# Patient Record
Sex: Female | Born: 1965 | Race: White | Hispanic: No | Smoking: Current every day smoker
Health system: Southern US, Community
[De-identification: ages and names within clinical notes are randomized; demographics above are authoritative.]

## PROBLEM LIST (undated history)

## (undated) DIAGNOSIS — I2510 Atherosclerotic heart disease of native coronary artery without angina pectoris: Secondary | ICD-10-CM

## (undated) DIAGNOSIS — G2581 Restless legs syndrome: Secondary | ICD-10-CM

## (undated) DIAGNOSIS — J449 Chronic obstructive pulmonary disease, unspecified: Secondary | ICD-10-CM

## (undated) DIAGNOSIS — T7840XA Allergy, unspecified, initial encounter: Secondary | ICD-10-CM

## (undated) DIAGNOSIS — J439 Emphysema, unspecified: Secondary | ICD-10-CM

## (undated) DIAGNOSIS — K219 Gastro-esophageal reflux disease without esophagitis: Secondary | ICD-10-CM

## (undated) DIAGNOSIS — G43909 Migraine, unspecified, not intractable, without status migrainosus: Secondary | ICD-10-CM

## (undated) DIAGNOSIS — F32A Depression, unspecified: Secondary | ICD-10-CM

## (undated) DIAGNOSIS — F1910 Other psychoactive substance abuse, uncomplicated: Secondary | ICD-10-CM

## (undated) DIAGNOSIS — M1990 Unspecified osteoarthritis, unspecified site: Secondary | ICD-10-CM

## (undated) DIAGNOSIS — M79606 Pain in leg, unspecified: Secondary | ICD-10-CM

## (undated) DIAGNOSIS — M48061 Spinal stenosis, lumbar region without neurogenic claudication: Secondary | ICD-10-CM

## (undated) DIAGNOSIS — F319 Bipolar disorder, unspecified: Secondary | ICD-10-CM

## (undated) DIAGNOSIS — J45909 Unspecified asthma, uncomplicated: Secondary | ICD-10-CM

## (undated) HISTORY — DX: Chronic obstructive pulmonary disease, unspecified: J44.9

## (undated) HISTORY — PX: OTHER SURGICAL HISTORY: SHX169

## (undated) HISTORY — DX: Pain in leg, unspecified: M79.606

## (undated) HISTORY — DX: Allergy, unspecified, initial encounter: T78.40XA

## (undated) HISTORY — PX: COLON SURGERY: SHX602

## (undated) HISTORY — PX: ABDOMINAL HYSTERECTOMY: SHX81

## (undated) HISTORY — DX: Atherosclerotic heart disease of native coronary artery without angina pectoris: I25.10

## (undated) HISTORY — DX: Gastro-esophageal reflux disease without esophagitis: K21.9

## (undated) HISTORY — DX: Restless legs syndrome: G25.81

## (undated) HISTORY — DX: Unspecified asthma, uncomplicated: J45.909

## (undated) HISTORY — DX: Other psychoactive substance abuse, uncomplicated: F19.10

## (undated) HISTORY — PX: BREAST SURGERY: SHX581

## (undated) HISTORY — DX: Emphysema, unspecified: J43.9

## (undated) HISTORY — PX: SMALL INTESTINE SURGERY: SHX150

## (undated) HISTORY — PX: CARPAL TUNNEL RELEASE: SHX101

## (undated) HISTORY — PX: TUBAL LIGATION: SHX77

## (undated) HISTORY — PX: APPENDECTOMY: SHX54

## (undated) HISTORY — PX: EYE SURGERY: SHX253

## (undated) HISTORY — DX: Migraine, unspecified, not intractable, without status migrainosus: G43.909

## (undated) HISTORY — DX: Bipolar disorder, unspecified: F31.9

## (undated) HISTORY — DX: Depression, unspecified: F32.A

## (undated) HISTORY — PX: TOTAL ABDOMINAL HYSTERECTOMY W/ BILATERAL SALPINGOOPHORECTOMY: SHX83

## (undated) HISTORY — DX: Unspecified osteoarthritis, unspecified site: M19.90

## (undated) HISTORY — DX: Spinal stenosis, lumbar region without neurogenic claudication: M48.061

---
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
## 2004-05-29 ENCOUNTER — Other Ambulatory Visit: Payer: Self-pay

## 2004-07-15 ENCOUNTER — Inpatient Hospital Stay: Payer: Self-pay | Admitting: Internal Medicine

## 2004-07-15 ENCOUNTER — Other Ambulatory Visit: Payer: Self-pay

## 2004-08-17 ENCOUNTER — Emergency Department: Payer: Self-pay | Admitting: Emergency Medicine

## 2004-12-13 ENCOUNTER — Inpatient Hospital Stay: Payer: Self-pay

## 2005-01-20 ENCOUNTER — Other Ambulatory Visit: Payer: Self-pay

## 2005-01-20 ENCOUNTER — Emergency Department: Payer: Self-pay | Admitting: Emergency Medicine

## 2006-01-22 ENCOUNTER — Emergency Department: Payer: Self-pay | Admitting: Emergency Medicine

## 2006-01-26 ENCOUNTER — Ambulatory Visit: Payer: Self-pay

## 2006-06-03 ENCOUNTER — Emergency Department: Payer: Self-pay | Admitting: Emergency Medicine

## 2006-07-04 ENCOUNTER — Emergency Department: Payer: Self-pay | Admitting: Emergency Medicine

## 2006-08-23 ENCOUNTER — Emergency Department: Payer: Self-pay | Admitting: Internal Medicine

## 2006-09-15 HISTORY — PX: TOTAL ABDOMINAL HYSTERECTOMY W/ BILATERAL SALPINGOOPHORECTOMY: SHX83

## 2006-09-21 ENCOUNTER — Ambulatory Visit: Payer: Self-pay | Admitting: Gastroenterology

## 2006-12-22 ENCOUNTER — Ambulatory Visit: Payer: Self-pay | Admitting: Unknown Physician Specialty

## 2006-12-24 ENCOUNTER — Ambulatory Visit: Payer: Self-pay | Admitting: Unknown Physician Specialty

## 2007-02-15 ENCOUNTER — Ambulatory Visit: Payer: Self-pay | Admitting: Unknown Physician Specialty

## 2007-02-16 ENCOUNTER — Inpatient Hospital Stay: Payer: Self-pay | Admitting: Unknown Physician Specialty

## 2007-02-27 ENCOUNTER — Emergency Department: Payer: Self-pay | Admitting: Emergency Medicine

## 2007-03-29 ENCOUNTER — Emergency Department: Payer: Self-pay | Admitting: Emergency Medicine

## 2007-05-11 ENCOUNTER — Ambulatory Visit: Payer: Self-pay | Admitting: Internal Medicine

## 2007-06-02 ENCOUNTER — Emergency Department: Payer: Self-pay | Admitting: Emergency Medicine

## 2007-10-11 ENCOUNTER — Emergency Department: Payer: Self-pay | Admitting: Emergency Medicine

## 2007-11-18 ENCOUNTER — Ambulatory Visit: Payer: Self-pay | Admitting: Internal Medicine

## 2008-03-21 ENCOUNTER — Ambulatory Visit: Payer: Self-pay | Admitting: Internal Medicine

## 2008-05-25 ENCOUNTER — Ambulatory Visit: Payer: Self-pay

## 2008-05-30 ENCOUNTER — Ambulatory Visit: Payer: Self-pay

## 2008-06-05 ENCOUNTER — Ambulatory Visit: Payer: Self-pay | Admitting: Internal Medicine

## 2008-07-12 ENCOUNTER — Ambulatory Visit: Payer: Self-pay | Admitting: Gastroenterology

## 2008-07-27 ENCOUNTER — Ambulatory Visit: Payer: Self-pay | Admitting: Gastroenterology

## 2008-12-04 ENCOUNTER — Ambulatory Visit: Payer: Self-pay

## 2008-12-23 ENCOUNTER — Emergency Department: Payer: Self-pay | Admitting: Emergency Medicine

## 2008-12-27 ENCOUNTER — Ambulatory Visit: Payer: Self-pay | Admitting: Pain Medicine

## 2008-12-27 ENCOUNTER — Emergency Department: Payer: Self-pay | Admitting: Emergency Medicine

## 2008-12-30 ENCOUNTER — Emergency Department: Payer: Self-pay | Admitting: Emergency Medicine

## 2009-01-07 ENCOUNTER — Emergency Department: Payer: Self-pay | Admitting: Emergency Medicine

## 2009-01-15 ENCOUNTER — Emergency Department: Payer: Self-pay | Admitting: Emergency Medicine

## 2009-01-18 ENCOUNTER — Emergency Department: Payer: Self-pay | Admitting: Emergency Medicine

## 2009-01-24 ENCOUNTER — Emergency Department: Payer: Self-pay | Admitting: Emergency Medicine

## 2009-01-24 ENCOUNTER — Ambulatory Visit: Payer: Self-pay | Admitting: Pain Medicine

## 2009-03-12 ENCOUNTER — Emergency Department: Payer: Self-pay | Admitting: Emergency Medicine

## 2009-03-14 ENCOUNTER — Ambulatory Visit: Payer: Self-pay | Admitting: Unknown Physician Specialty

## 2009-03-16 ENCOUNTER — Ambulatory Visit: Payer: Self-pay | Admitting: Neurology

## 2009-03-22 ENCOUNTER — Ambulatory Visit: Payer: Self-pay | Admitting: Neurology

## 2009-03-30 ENCOUNTER — Ambulatory Visit: Payer: Self-pay | Admitting: Neurology

## 2009-04-20 ENCOUNTER — Observation Stay: Payer: Self-pay | Admitting: Internal Medicine

## 2009-04-30 ENCOUNTER — Emergency Department: Payer: Self-pay | Admitting: Emergency Medicine

## 2009-08-21 ENCOUNTER — Emergency Department: Payer: Self-pay | Admitting: Emergency Medicine

## 2010-03-14 ENCOUNTER — Emergency Department: Payer: Self-pay | Admitting: Emergency Medicine

## 2010-11-22 ENCOUNTER — Inpatient Hospital Stay: Payer: Self-pay | Admitting: Surgery

## 2010-11-26 LAB — PATHOLOGY REPORT

## 2011-06-25 DIAGNOSIS — J4489 Other specified chronic obstructive pulmonary disease: Secondary | ICD-10-CM | POA: Insufficient documentation

## 2011-06-25 DIAGNOSIS — F17210 Nicotine dependence, cigarettes, uncomplicated: Secondary | ICD-10-CM | POA: Insufficient documentation

## 2011-06-25 DIAGNOSIS — J449 Chronic obstructive pulmonary disease, unspecified: Secondary | ICD-10-CM

## 2011-06-25 DIAGNOSIS — F17219 Nicotine dependence, cigarettes, with unspecified nicotine-induced disorders: Secondary | ICD-10-CM | POA: Insufficient documentation

## 2011-06-25 HISTORY — DX: Chronic obstructive pulmonary disease, unspecified: J44.9

## 2011-06-25 HISTORY — DX: Other specified chronic obstructive pulmonary disease: J44.89

## 2011-06-25 HISTORY — DX: Nicotine dependence, cigarettes, with unspecified nicotine-induced disorders: F17.219

## 2012-02-02 ENCOUNTER — Ambulatory Visit: Payer: Self-pay | Admitting: Specialist

## 2012-02-02 DIAGNOSIS — I10 Essential (primary) hypertension: Secondary | ICD-10-CM

## 2012-02-02 LAB — POTASSIUM: Potassium: 3.8 mmol/L (ref 3.5–5.1)

## 2012-02-06 ENCOUNTER — Ambulatory Visit: Payer: Self-pay | Admitting: Specialist

## 2012-09-07 LAB — CBC
HCT: 38.5 % (ref 35.0–47.0)
Platelet: 277 10*3/uL (ref 150–440)
RDW: 13.8 % (ref 11.5–14.5)
WBC: 13.7 10*3/uL — ABNORMAL HIGH (ref 3.6–11.0)

## 2012-09-07 LAB — URINALYSIS, COMPLETE
Bacteria: NONE SEEN
Bilirubin,UR: NEGATIVE
Glucose,UR: NEGATIVE mg/dL (ref 0–75)
Protein: 100
Specific Gravity: 1.031 (ref 1.003–1.030)
Squamous Epithelial: 5
WBC UR: 23 /HPF (ref 0–5)

## 2012-09-07 LAB — COMPREHENSIVE METABOLIC PANEL
Alkaline Phosphatase: 101 U/L (ref 50–136)
Anion Gap: 9 (ref 7–16)
BUN: 20 mg/dL — ABNORMAL HIGH (ref 7–18)
Bilirubin,Total: 0.7 mg/dL (ref 0.2–1.0)
Creatinine: 0.91 mg/dL (ref 0.60–1.30)
EGFR (Non-African Amer.): >60
Glucose: 120 mg/dL — ABNORMAL HIGH (ref 65–99)
Osmolality: 276 (ref 275–301)
SGPT (ALT): 14 U/L (ref 12–78)
Sodium: 136 mmol/L (ref 136–145)

## 2012-09-07 LAB — LIPASE, BLOOD: Lipase: 79 U/L (ref 73–393)

## 2012-09-08 ENCOUNTER — Inpatient Hospital Stay: Payer: Self-pay | Admitting: Surgery

## 2012-09-08 LAB — CBC WITH DIFFERENTIAL/PLATELET
HCT: 42.8 % (ref 35.0–47.0)
Lymphocyte #: 0.4 10*3/uL — ABNORMAL LOW (ref 1.0–3.6)
MCH: 32.1 pg (ref 26.0–34.0)
MCHC: 34 g/dL (ref 32.0–36.0)
MCV: 94 fL (ref 80–100)
Monocyte #: 0.4 x10 3/mm (ref 0.2–0.9)
Monocyte %: 3.7 %
Neutrophil #: 10.1 10*3/uL — ABNORMAL HIGH (ref 1.4–6.5)
RDW: 14.2 % (ref 11.5–14.5)
WBC: 10.9 10*3/uL (ref 3.6–11.0)

## 2012-09-08 LAB — BASIC METABOLIC PANEL
Calcium, Total: 7.4 mg/dL — ABNORMAL LOW (ref 8.5–10.1)
Co2: 21 mmol/L (ref 21–32)
EGFR (Non-African Amer.): >60
Potassium: 3.6 mmol/L (ref 3.5–5.1)
Sodium: 138 mmol/L (ref 136–145)

## 2012-09-09 LAB — BASIC METABOLIC PANEL
Calcium, Total: 7.2 mg/dL — ABNORMAL LOW (ref 8.5–10.1)
Chloride: 107 mmol/L (ref 98–107)
Osmolality: 275 (ref 275–301)
Potassium: 3.9 mmol/L (ref 3.5–5.1)
Sodium: 137 mmol/L (ref 136–145)

## 2012-09-09 LAB — CBC WITH DIFFERENTIAL/PLATELET
Basophil #: 0 10*3/uL (ref 0.0–0.1)
Eosinophil #: 0 10*3/uL (ref 0.0–0.7)
HCT: 32 % — ABNORMAL LOW (ref 35.0–47.0)
Lymphocyte #: 0.7 10*3/uL — ABNORMAL LOW (ref 1.0–3.6)
Lymphocyte %: 4.8 %
MCHC: 33.2 g/dL (ref 32.0–36.0)
Neutrophil #: 12.5 10*3/uL — ABNORMAL HIGH (ref 1.4–6.5)
RDW: 14.3 % (ref 11.5–14.5)

## 2012-09-10 DIAGNOSIS — I472 Ventricular tachycardia, unspecified: Secondary | ICD-10-CM

## 2012-09-10 DIAGNOSIS — I4729 Other ventricular tachycardia: Secondary | ICD-10-CM

## 2012-09-10 LAB — CBC WITH DIFFERENTIAL/PLATELET
Basophil #: 0 10*3/uL (ref 0.0–0.1)
Eosinophil #: 0 10*3/uL (ref 0.0–0.7)
Lymphocyte #: 0.9 10*3/uL — ABNORMAL LOW (ref 1.0–3.6)
Lymphocyte %: 8.4 %
MCH: 32.2 pg (ref 26.0–34.0)
Monocyte %: 6.2 %
Neutrophil #: 9 10*3/uL — ABNORMAL HIGH (ref 1.4–6.5)
Neutrophil %: 84.8 %
Platelet: 264 10*3/uL (ref 150–440)
RBC: 2.94 10*6/uL — ABNORMAL LOW (ref 3.80–5.20)
WBC: 10.7 10*3/uL (ref 3.6–11.0)

## 2012-09-10 LAB — BASIC METABOLIC PANEL
Anion Gap: 8 (ref 7–16)
Anion Gap: 9 (ref 7–16)
Calcium, Total: 7.2 mg/dL — ABNORMAL LOW (ref 8.5–10.1)
Calcium, Total: 7.3 mg/dL — ABNORMAL LOW (ref 8.5–10.1)
Chloride: 110 mmol/L — ABNORMAL HIGH (ref 98–107)
Co2: 21 mmol/L (ref 21–32)
Co2: 20 mmol/L — ABNORMAL LOW (ref 21–32)
Creatinine: 0.53 mg/dL — ABNORMAL LOW (ref 0.60–1.30)
EGFR (African American): >60
EGFR (African American): >60
Glucose: 97 mg/dL (ref 65–99)
Osmolality: 275 (ref 275–301)
Potassium: 3.6 mmol/L (ref 3.5–5.1)
Sodium: 139 mmol/L (ref 136–145)

## 2012-09-10 LAB — MAGNESIUM: Magnesium: 2.2 mg/dL

## 2012-09-10 LAB — PATHOLOGY REPORT

## 2012-09-11 LAB — CBC WITH DIFFERENTIAL/PLATELET
Basophil %: 0.4 %
Eosinophil %: 0.6 %
HCT: 28.2 % — ABNORMAL LOW (ref 35.0–47.0)
HGB: 10.2 g/dL — ABNORMAL LOW (ref 12.0–16.0)
Lymphocyte %: 12.6 %
Monocyte #: 1.1 x10 3/mm — ABNORMAL HIGH (ref 0.2–0.9)
Monocyte %: 9.1 %
WBC: 12.2 10*3/uL — ABNORMAL HIGH (ref 3.6–11.0)

## 2012-09-11 LAB — BASIC METABOLIC PANEL
Anion Gap: 7 (ref 7–16)
BUN: 4 mg/dL — ABNORMAL LOW (ref 7–18)
Chloride: 115 mmol/L — ABNORMAL HIGH (ref 98–107)
Co2: 20 mmol/L — ABNORMAL LOW (ref 21–32)
Creatinine: 0.53 mg/dL — ABNORMAL LOW (ref 0.60–1.30)
Potassium: 3.6 mmol/L (ref 3.5–5.1)

## 2012-09-12 LAB — PHOSPHORUS: Phosphorus: 2 mg/dL — ABNORMAL LOW (ref 2.5–4.9)

## 2012-09-12 LAB — BASIC METABOLIC PANEL
Anion Gap: 8 (ref 7–16)
BUN: 6 mg/dL — ABNORMAL LOW (ref 7–18)
Creatinine: 0.64 mg/dL (ref 0.60–1.30)
EGFR (African American): >60
Glucose: 140 mg/dL — ABNORMAL HIGH (ref 65–99)

## 2012-09-14 LAB — CBC WITH DIFFERENTIAL/PLATELET
Basophil #: 0.3 10*3/uL — ABNORMAL HIGH (ref 0.0–0.1)
Basophil %: 1.4 %
HCT: 26.3 % — ABNORMAL LOW (ref 35.0–47.0)
HGB: 8.9 g/dL — ABNORMAL LOW (ref 12.0–16.0)
Lymphocyte %: 9.8 %
Monocyte %: 6.5 %
Neutrophil #: 15.6 10*3/uL — ABNORMAL HIGH (ref 1.4–6.5)
RDW: 14.6 % — ABNORMAL HIGH (ref 11.5–14.5)
WBC: 19.3 10*3/uL — ABNORMAL HIGH (ref 3.6–11.0)

## 2012-09-14 LAB — BASIC METABOLIC PANEL
BUN: 8 mg/dL (ref 7–18)
Chloride: 115 mmol/L — ABNORMAL HIGH (ref 98–107)
Osmolality: 285 (ref 275–301)
Potassium: 3.6 mmol/L (ref 3.5–5.1)

## 2012-09-14 LAB — TSH: Thyroid Stimulating Horm: 6.84 u[IU]/mL — ABNORMAL HIGH

## 2012-09-15 HISTORY — PX: COLOSTOMY REVISION: SHX5232

## 2012-09-15 LAB — BASIC METABOLIC PANEL WITH GFR
Anion Gap: 9 (ref 7–16)
BUN: 9 mg/dL (ref 7–18)
Calcium, Total: 7.4 mg/dL — ABNORMAL LOW (ref 8.5–10.1)
Chloride: 113 mmol/L — ABNORMAL HIGH (ref 98–107)
Co2: 20 mmol/L — ABNORMAL LOW (ref 21–32)
Creatinine: 0.64 mg/dL (ref 0.60–1.30)
EGFR (African American): >60
EGFR (Non-African Amer.): >60
Glucose: 98 mg/dL (ref 65–99)
Osmolality: 282 (ref 275–301)
Potassium: 3.5 mmol/L (ref 3.5–5.1)
Sodium: 142 mmol/L (ref 136–145)

## 2012-09-15 LAB — CBC WITH DIFFERENTIAL/PLATELET
Eosinophil #: 0.2 10*3/uL (ref 0.0–0.7)
HCT: 22.3 % — ABNORMAL LOW (ref 35.0–47.0)
Lymphocyte %: 15.6 %
MCH: 31.4 pg (ref 26.0–34.0)
MCHC: 33.7 g/dL (ref 32.0–36.0)
MCV: 93 fL (ref 80–100)
Monocyte #: 1 x10 3/mm — ABNORMAL HIGH (ref 0.2–0.9)
Monocyte %: 7.7 %
Neutrophil %: 73.7 %
WBC: 13.5 10*3/uL — ABNORMAL HIGH (ref 3.6–11.0)

## 2012-09-16 LAB — CBC WITH DIFFERENTIAL/PLATELET
Basophil #: 0.1 10*3/uL (ref 0.0–0.1)
Basophil %: 1.2 %
Eosinophil #: 0.2 10*3/uL (ref 0.0–0.7)
Eosinophil %: 1.8 %
HCT: 29.6 % — ABNORMAL LOW (ref 35.0–47.0)
Lymphocyte #: 2.1 10*3/uL (ref 1.0–3.6)
MCH: 30.3 pg (ref 26.0–34.0)
MCV: 91 fL (ref 80–100)
Monocyte #: 1.2 x10 3/mm — ABNORMAL HIGH (ref 0.2–0.9)
Neutrophil %: 71.5 %
RBC: 3.26 10*6/uL — ABNORMAL LOW (ref 3.80–5.20)
WBC: 12.7 10*3/uL — ABNORMAL HIGH (ref 3.6–11.0)

## 2012-09-16 LAB — BASIC METABOLIC PANEL
BUN: 11 mg/dL (ref 7–18)
Calcium, Total: 7.6 mg/dL — ABNORMAL LOW (ref 8.5–10.1)
Co2: 18 mmol/L — ABNORMAL LOW (ref 21–32)
Creatinine: 0.67 mg/dL (ref 0.60–1.30)
Glucose: 117 mg/dL — ABNORMAL HIGH (ref 65–99)
Osmolality: 287 (ref 275–301)
Sodium: 144 mmol/L (ref 136–145)

## 2012-09-20 LAB — CBC WITH DIFFERENTIAL/PLATELET
Basophil #: 0.2 10*3/uL — ABNORMAL HIGH (ref 0.0–0.1)
Basophil %: 1.5 %
Eosinophil #: 0.1 10*3/uL (ref 0.0–0.7)
HCT: 30.2 % — ABNORMAL LOW (ref 35.0–47.0)
HGB: 9.4 g/dL — ABNORMAL LOW (ref 12.0–16.0)
Lymphocyte #: 2.3 10*3/uL (ref 1.0–3.6)
MCHC: 31.3 g/dL — ABNORMAL LOW (ref 32.0–36.0)
MCV: 92 fL (ref 80–100)
Monocyte %: 8.5 %
Neutrophil #: 6.6 10*3/uL — ABNORMAL HIGH (ref 1.4–6.5)
WBC: 10 10*3/uL (ref 3.6–11.0)

## 2012-09-20 LAB — BASIC METABOLIC PANEL
Anion Gap: 10 (ref 7–16)
BUN: 13 mg/dL (ref 7–18)
Chloride: 109 mmol/L — ABNORMAL HIGH (ref 98–107)
Co2: 22 mmol/L (ref 21–32)
Creatinine: 0.62 mg/dL (ref 0.60–1.30)
Glucose: 122 mg/dL — ABNORMAL HIGH (ref 65–99)
Osmolality: 283 (ref 275–301)
Potassium: 3.7 mmol/L (ref 3.5–5.1)
Sodium: 141 mmol/L (ref 136–145)

## 2012-09-22 ENCOUNTER — Emergency Department: Payer: Self-pay | Admitting: Emergency Medicine

## 2012-09-22 LAB — COMPREHENSIVE METABOLIC PANEL
Alkaline Phosphatase: 150 U/L — ABNORMAL HIGH (ref 50–136)
Anion Gap: 10 (ref 7–16)
BUN: 14 mg/dL (ref 7–18)
Chloride: 109 mmol/L — ABNORMAL HIGH (ref 98–107)
Co2: 20 mmol/L — ABNORMAL LOW (ref 21–32)
Creatinine: 0.69 mg/dL (ref 0.60–1.30)
Glucose: 116 mg/dL — ABNORMAL HIGH (ref 65–99)
Osmolality: 279 (ref 275–301)
Sodium: 139 mmol/L (ref 136–145)
Total Protein: 9.4 g/dL — ABNORMAL HIGH (ref 6.4–8.2)

## 2012-09-22 LAB — URINALYSIS, COMPLETE
Bilirubin,UR: NEGATIVE
Blood: NEGATIVE
Glucose,UR: NEGATIVE mg/dL (ref 0–75)
Leukocyte Esterase: NEGATIVE
Nitrite: NEGATIVE
Protein: 30
RBC,UR: 4 /HPF (ref 0–5)
Specific Gravity: 1.029 (ref 1.003–1.030)
Squamous Epithelial: 1

## 2012-09-22 LAB — CBC
HCT: 34.1 % — ABNORMAL LOW (ref 35.0–47.0)
HGB: 11.6 g/dL — ABNORMAL LOW (ref 12.0–16.0)
MCHC: 33.9 g/dL (ref 32.0–36.0)
MCV: 91 fL (ref 80–100)
Platelet: 753 10*3/uL — ABNORMAL HIGH (ref 150–440)
RBC: 3.73 10*6/uL — ABNORMAL LOW (ref 3.80–5.20)

## 2012-09-23 ENCOUNTER — Inpatient Hospital Stay: Payer: Self-pay | Admitting: Surgery

## 2012-09-23 LAB — URINALYSIS, COMPLETE
Bilirubin,UR: NEGATIVE
Glucose,UR: NEGATIVE mg/dL (ref 0–75)
Leukocyte Esterase: NEGATIVE
Nitrite: NEGATIVE
Ph: 5 (ref 4.5–8.0)
Protein: NEGATIVE
RBC,UR: <1 /HPF (ref 0–5)
Specific Gravity: 1.011 (ref 1.003–1.030)
Squamous Epithelial: 1
Transitional Epi: <1
WBC UR: 3 /HPF (ref 0–5)

## 2012-09-23 LAB — CBC
HCT: 34.3 % — ABNORMAL LOW (ref 35.0–47.0)
HGB: 11.5 g/dL — ABNORMAL LOW (ref 12.0–16.0)
Platelet: 647 10*3/uL — ABNORMAL HIGH (ref 150–440)
RBC: 3.73 10*6/uL — ABNORMAL LOW (ref 3.80–5.20)

## 2012-09-23 LAB — COMPREHENSIVE METABOLIC PANEL
Anion Gap: 11 (ref 7–16)
BUN: 11 mg/dL (ref 7–18)
Bilirubin,Total: 0.3 mg/dL (ref 0.2–1.0)
Calcium, Total: 8.5 mg/dL (ref 8.5–10.1)
EGFR (African American): >60
EGFR (Non-African Amer.): >60
Osmolality: 268 (ref 275–301)
Potassium: 3.2 mmol/L — ABNORMAL LOW (ref 3.5–5.1)
SGOT(AST): 16 U/L (ref 15–37)
Sodium: 134 mmol/L — ABNORMAL LOW (ref 136–145)
Total Protein: 9.2 g/dL — ABNORMAL HIGH (ref 6.4–8.2)

## 2012-09-23 LAB — LIPASE, BLOOD: Lipase: 87 U/L (ref 73–393)

## 2012-09-26 LAB — COMPREHENSIVE METABOLIC PANEL
Albumin: 2.6 g/dL — ABNORMAL LOW (ref 3.4–5.0)
Alkaline Phosphatase: 168 U/L — ABNORMAL HIGH (ref 50–136)
Anion Gap: 8 (ref 7–16)
Bilirubin,Total: 0.3 mg/dL (ref 0.2–1.0)
Chloride: 104 mmol/L (ref 98–107)
Creatinine: 0.97 mg/dL (ref 0.60–1.30)
EGFR (African American): >60
EGFR (Non-African Amer.): >60
Glucose: 102 mg/dL — ABNORMAL HIGH (ref 65–99)
Osmolality: 275 (ref 275–301)
Potassium: 2.8 mmol/L — ABNORMAL LOW (ref 3.5–5.1)
SGPT (ALT): 13 U/L (ref 12–78)
Sodium: 138 mmol/L (ref 136–145)
Total Protein: 9.1 g/dL — ABNORMAL HIGH (ref 6.4–8.2)

## 2012-09-26 LAB — CBC WITH DIFFERENTIAL/PLATELET
Basophil #: 0 10*3/uL (ref 0.0–0.1)
Basophil %: 0.3 %
Eosinophil #: 0.1 10*3/uL (ref 0.0–0.7)
Eosinophil %: 0.8 %
HCT: 35.9 % (ref 35.0–47.0)
Lymphocyte #: 1.3 10*3/uL (ref 1.0–3.6)
MCH: 29.9 pg (ref 26.0–34.0)
MCV: 92 fL (ref 80–100)
Monocyte #: 1 x10 3/mm — ABNORMAL HIGH (ref 0.2–0.9)
Neutrophil #: 11.5 10*3/uL — ABNORMAL HIGH (ref 1.4–6.5)
Neutrophil %: 82 %
RDW: 15 % — ABNORMAL HIGH (ref 11.5–14.5)
WBC: 14 10*3/uL — ABNORMAL HIGH (ref 3.6–11.0)

## 2012-09-26 LAB — URINALYSIS, COMPLETE
Blood: NEGATIVE
Ketone: NEGATIVE
Leukocyte Esterase: NEGATIVE
Nitrite: NEGATIVE
Ph: 5 (ref 4.5–8.0)
Protein: NEGATIVE
Specific Gravity: 1.011 (ref 1.003–1.030)

## 2012-09-27 ENCOUNTER — Inpatient Hospital Stay: Payer: Self-pay | Admitting: Surgery

## 2012-09-27 LAB — CBC WITH DIFFERENTIAL/PLATELET
Basophil %: 0.5 %
Eosinophil #: 0.1 10*3/uL (ref 0.0–0.7)
HCT: 30 % — ABNORMAL LOW (ref 35.0–47.0)
Lymphocyte #: 1.7 10*3/uL (ref 1.0–3.6)
Lymphocyte %: 23.8 %
MCH: 29.7 pg (ref 26.0–34.0)
MCHC: 32.6 g/dL (ref 32.0–36.0)
Monocyte #: 0.9 x10 3/mm (ref 0.2–0.9)
Monocyte %: 13.2 %
Neutrophil #: 4.4 10*3/uL (ref 1.4–6.5)
Neutrophil %: 61.3 %
RBC: 3.29 10*6/uL — ABNORMAL LOW (ref 3.80–5.20)
RDW: 14.9 % — ABNORMAL HIGH (ref 11.5–14.5)
WBC: 7.2 10*3/uL (ref 3.6–11.0)

## 2012-09-27 LAB — MAGNESIUM: Magnesium: 1.9 mg/dL

## 2012-09-27 LAB — BASIC METABOLIC PANEL
Anion Gap: 14 (ref 7–16)
BUN: 9 mg/dL (ref 7–18)
Calcium, Total: 8.2 mg/dL — ABNORMAL LOW (ref 8.5–10.1)
Co2: 19 mmol/L — ABNORMAL LOW (ref 21–32)
EGFR (African American): >60
EGFR (Non-African Amer.): >60
Glucose: 86 mg/dL (ref 65–99)
Osmolality: 277 (ref 275–301)
Potassium: 3.2 mmol/L — ABNORMAL LOW (ref 3.5–5.1)
Sodium: 140 mmol/L (ref 136–145)

## 2012-09-28 LAB — CBC WITH DIFFERENTIAL/PLATELET
Basophil #: 0 10*3/uL (ref 0.0–0.1)
Basophil %: 0.5 %
Eosinophil #: 0.1 10*3/uL (ref 0.0–0.7)
HCT: 26 % — ABNORMAL LOW (ref 35.0–47.0)
HGB: 8.7 g/dL — ABNORMAL LOW (ref 12.0–16.0)
MCH: 30.1 pg (ref 26.0–34.0)
MCV: 91 fL (ref 80–100)
Monocyte #: 0.8 x10 3/mm (ref 0.2–0.9)
Neutrophil #: 2.3 10*3/uL (ref 1.4–6.5)
Neutrophil %: 46.7 %
RBC: 2.87 10*6/uL — ABNORMAL LOW (ref 3.80–5.20)
RDW: 14.9 % — ABNORMAL HIGH (ref 11.5–14.5)

## 2012-09-28 LAB — COMPREHENSIVE METABOLIC PANEL
Albumin: 1.7 g/dL — ABNORMAL LOW (ref 3.4–5.0)
Alkaline Phosphatase: 105 U/L (ref 50–136)
Anion Gap: 7 (ref 7–16)
Calcium, Total: 7.6 mg/dL — ABNORMAL LOW (ref 8.5–10.1)
Co2: 24 mmol/L (ref 21–32)
Creatinine: 0.46 mg/dL — ABNORMAL LOW (ref 0.60–1.30)
EGFR (African American): >60
EGFR (Non-African Amer.): >60
Glucose: 99 mg/dL (ref 65–99)
Osmolality: 277 (ref 275–301)
SGOT(AST): 9 U/L — ABNORMAL LOW (ref 15–37)
SGPT (ALT): 8 U/L — ABNORMAL LOW (ref 12–78)
Sodium: 140 mmol/L (ref 136–145)
Total Protein: 6.3 g/dL — ABNORMAL LOW (ref 6.4–8.2)

## 2012-09-28 LAB — APTT: Activated PTT: 38.8 secs — ABNORMAL HIGH (ref 23.6–35.9)

## 2012-09-28 LAB — PROTIME-INR: Prothrombin Time: 14.2 secs (ref 11.5–14.7)

## 2012-09-28 LAB — MAGNESIUM: Magnesium: 1.6 mg/dL — ABNORMAL LOW

## 2012-09-28 LAB — POTASSIUM: Potassium: 3.4 mmol/L — ABNORMAL LOW (ref 3.5–5.1)

## 2012-09-29 LAB — MAGNESIUM: Magnesium: 2.6 mg/dL — ABNORMAL HIGH

## 2012-09-29 LAB — BASIC METABOLIC PANEL
Anion Gap: 7 (ref 7–16)
BUN: 5 mg/dL — ABNORMAL LOW (ref 7–18)
Calcium, Total: 7.7 mg/dL — ABNORMAL LOW (ref 8.5–10.1)
Co2: 23 mmol/L (ref 21–32)
EGFR (Non-African Amer.): >60
Glucose: 97 mg/dL (ref 65–99)
Potassium: 3.7 mmol/L (ref 3.5–5.1)
Sodium: 143 mmol/L (ref 136–145)

## 2012-09-29 LAB — PATHOLOGY REPORT

## 2012-09-29 LAB — CBC WITH DIFFERENTIAL/PLATELET
Basophil #: 0.1 10*3/uL (ref 0.0–0.1)
Eosinophil #: 0.2 10*3/uL (ref 0.0–0.7)
Eosinophil %: 3.6 %
HCT: 26.3 % — ABNORMAL LOW (ref 35.0–47.0)
HGB: 8 g/dL — ABNORMAL LOW (ref 12.0–16.0)
Lymphocyte #: 1.5 10*3/uL (ref 1.0–3.6)
Lymphocyte %: 31.1 %
MCV: 91 fL (ref 80–100)
Monocyte %: 15.4 %
Platelet: 422 10*3/uL (ref 150–440)
RDW: 15 % — ABNORMAL HIGH (ref 11.5–14.5)
WBC: 4.7 10*3/uL (ref 3.6–11.0)

## 2012-09-30 LAB — COMPREHENSIVE METABOLIC PANEL
Albumin: 1.8 g/dL — ABNORMAL LOW (ref 3.4–5.0)
Alkaline Phosphatase: 115 U/L (ref 50–136)
Bilirubin,Total: 0.1 mg/dL — ABNORMAL LOW (ref 0.2–1.0)
Calcium, Total: 7.9 mg/dL — ABNORMAL LOW (ref 8.5–10.1)
Co2: 24 mmol/L (ref 21–32)
EGFR (African American): >60
Glucose: 83 mg/dL (ref 65–99)
Osmolality: 280 (ref 275–301)
Potassium: 4.7 mmol/L (ref 3.5–5.1)
SGOT(AST): 10 U/L — ABNORMAL LOW (ref 15–37)
SGPT (ALT): 8 U/L — ABNORMAL LOW (ref 12–78)

## 2012-10-02 LAB — CULTURE, BLOOD (SINGLE)

## 2012-10-02 LAB — WOUND CULTURE

## 2012-10-03 LAB — COMPREHENSIVE METABOLIC PANEL
Albumin: 2.5 g/dL — ABNORMAL LOW (ref 3.4–5.0)
Alkaline Phosphatase: 135 U/L (ref 50–136)
Anion Gap: 7 (ref 7–16)
Bilirubin,Total: 0.3 mg/dL (ref 0.2–1.0)
Chloride: 102 mmol/L (ref 98–107)
Co2: 24 mmol/L (ref 21–32)
Creatinine: 0.46 mg/dL — ABNORMAL LOW (ref 0.60–1.30)
EGFR (African American): >60
EGFR (Non-African Amer.): >60
Osmolality: 267 (ref 275–301)
SGOT(AST): 20 U/L (ref 15–37)
Sodium: 133 mmol/L — ABNORMAL LOW (ref 136–145)

## 2012-10-03 LAB — CBC WITH DIFFERENTIAL/PLATELET
Basophil %: 0.8 %
HCT: 32.8 % — ABNORMAL LOW (ref 35.0–47.0)
MCH: 30.7 pg (ref 26.0–34.0)
MCHC: 33.8 g/dL (ref 32.0–36.0)
MCV: 91 fL (ref 80–100)
Monocyte %: 8.2 %
Platelet: 539 10*3/uL — ABNORMAL HIGH (ref 150–440)
RBC: 3.6 10*6/uL — ABNORMAL LOW (ref 3.80–5.20)

## 2012-10-03 LAB — WOUND CULTURE

## 2012-10-03 LAB — PROTIME-INR
INR: 1
Prothrombin Time: 13.8 secs (ref 11.5–14.7)

## 2012-10-04 LAB — PROTIME-INR
INR: 1
Prothrombin Time: 13.8 secs (ref 11.5–14.7)

## 2012-10-05 LAB — PHOSPHORUS: Phosphorus: 4.4 mg/dL (ref 2.5–4.9)

## 2012-10-05 LAB — SODIUM: Sodium: 133 mmol/L — ABNORMAL LOW (ref 136–145)

## 2012-10-05 LAB — CALCIUM: Calcium, Total: 9.1 mg/dL (ref 8.5–10.1)

## 2012-10-05 LAB — POTASSIUM: Potassium: 3.8 mmol/L (ref 3.5–5.1)

## 2012-10-05 LAB — ALBUMIN: Albumin: 2.7 g/dL — ABNORMAL LOW (ref 3.4–5.0)

## 2012-10-09 ENCOUNTER — Inpatient Hospital Stay: Payer: Self-pay | Admitting: Surgery

## 2012-10-09 LAB — DRUG SCREEN, URINE
Cannabinoid 50 Ng, Ur ~~LOC~~: POSITIVE (ref ?–50)
Cocaine Metabolite,Ur ~~LOC~~: NEGATIVE (ref ?–300)
MDMA (Ecstasy)Ur Screen: NEGATIVE (ref ?–500)
Opiate, Ur Screen: POSITIVE (ref ?–300)
Phencyclidine (PCP) Ur S: NEGATIVE (ref ?–25)
Tricyclic, Ur Screen: NEGATIVE (ref ?–1000)

## 2012-10-09 LAB — URINALYSIS, COMPLETE
Glucose,UR: NEGATIVE mg/dL (ref 0–75)
Leukocyte Esterase: NEGATIVE
Nitrite: NEGATIVE
Ph: 5 (ref 4.5–8.0)
RBC,UR: NONE SEEN /HPF (ref 0–5)
Specific Gravity: 1.015 (ref 1.003–1.030)
Squamous Epithelial: 2

## 2012-10-09 LAB — LIPASE, BLOOD: Lipase: 76 U/L (ref 73–393)

## 2012-10-09 LAB — COMPREHENSIVE METABOLIC PANEL
BUN: 20 mg/dL — ABNORMAL HIGH (ref 7–18)
Bilirubin,Total: 0.3 mg/dL (ref 0.2–1.0)
Chloride: 100 mmol/L (ref 98–107)
Creatinine: 0.76 mg/dL (ref 0.60–1.30)
Potassium: 3.4 mmol/L — ABNORMAL LOW (ref 3.5–5.1)
SGOT(AST): 18 U/L (ref 15–37)
SGPT (ALT): 16 U/L (ref 12–78)
Total Protein: 9.2 g/dL — ABNORMAL HIGH (ref 6.4–8.2)

## 2012-10-09 LAB — CBC
HCT: 33.6 % — ABNORMAL LOW (ref 35.0–47.0)
MCH: 30.2 pg (ref 26.0–34.0)
MCHC: 33.7 g/dL (ref 32.0–36.0)
MCV: 89 fL (ref 80–100)
Platelet: 604 10*3/uL — ABNORMAL HIGH (ref 150–440)
RBC: 3.76 10*6/uL — ABNORMAL LOW (ref 3.80–5.20)
WBC: 11.1 10*3/uL — ABNORMAL HIGH (ref 3.6–11.0)

## 2012-10-09 LAB — TROPONIN I: Troponin-I: <0.02 ng/mL

## 2012-10-10 LAB — CBC WITH DIFFERENTIAL/PLATELET
Basophil #: 0.1 10*3/uL (ref 0.0–0.1)
Basophil %: 0.8 %
Eosinophil #: 0.1 10*3/uL (ref 0.0–0.7)
Eosinophil %: 1.7 %
HCT: 27.7 % — ABNORMAL LOW (ref 35.0–47.0)
Lymphocyte #: 2.1 10*3/uL (ref 1.0–3.6)
MCHC: 33.5 g/dL (ref 32.0–36.0)
Monocyte #: 0.6 x10 3/mm (ref 0.2–0.9)
Monocyte %: 10.1 %
Neutrophil #: 3.5 10*3/uL (ref 1.4–6.5)
Neutrophil %: 53.9 %
RBC: 3.08 10*6/uL — ABNORMAL LOW (ref 3.80–5.20)
RDW: 14.9 % — ABNORMAL HIGH (ref 11.5–14.5)
WBC: 6.4 10*3/uL (ref 3.6–11.0)

## 2012-10-10 LAB — BASIC METABOLIC PANEL
Anion Gap: 6 — ABNORMAL LOW (ref 7–16)
Chloride: 105 mmol/L (ref 98–107)
EGFR (Non-African Amer.): >60
Glucose: 96 mg/dL (ref 65–99)
Potassium: 3.3 mmol/L — ABNORMAL LOW (ref 3.5–5.1)

## 2012-10-11 LAB — CBC WITH DIFFERENTIAL/PLATELET
Basophil #: 0 10*3/uL (ref 0.0–0.1)
Eosinophil #: 0.1 10*3/uL (ref 0.0–0.7)
Eosinophil %: 1.6 %
HGB: 8.1 g/dL — ABNORMAL LOW (ref 12.0–16.0)
Lymphocyte #: 2.2 10*3/uL (ref 1.0–3.6)
Lymphocyte %: 43.7 %
MCH: 29.6 pg (ref 26.0–34.0)
MCHC: 32.5 g/dL (ref 32.0–36.0)
Monocyte #: 0.4 x10 3/mm (ref 0.2–0.9)
Monocyte %: 8.6 %
Neutrophil #: 2.3 10*3/uL (ref 1.4–6.5)
Platelet: 427 10*3/uL (ref 150–440)
RBC: 2.74 10*6/uL — ABNORMAL LOW (ref 3.80–5.20)
RDW: 14.8 % — ABNORMAL HIGH (ref 11.5–14.5)
WBC: 5.1 10*3/uL (ref 3.6–11.0)

## 2012-10-11 LAB — BASIC METABOLIC PANEL
Anion Gap: 9 (ref 7–16)
BUN: 8 mg/dL (ref 7–18)
Calcium, Total: 8.1 mg/dL — ABNORMAL LOW (ref 8.5–10.1)
Co2: 24 mmol/L (ref 21–32)
Creatinine: 0.54 mg/dL — ABNORMAL LOW (ref 0.60–1.30)
EGFR (African American): >60
EGFR (Non-African Amer.): >60
Osmolality: 274 (ref 275–301)

## 2012-10-15 ENCOUNTER — Emergency Department (HOSPITAL_COMMUNITY): Payer: Medicaid Other

## 2012-10-15 ENCOUNTER — Emergency Department (HOSPITAL_COMMUNITY)
Admission: EM | Admit: 2012-10-15 | Discharge: 2012-10-16 | Disposition: A | Discharge status: Home / Self Care | Payer: Medicaid Other | Attending: Emergency Medicine | Admitting: Emergency Medicine

## 2012-10-15 DIAGNOSIS — IMO0002 Reserved for concepts with insufficient information to code with codable children: Secondary | ICD-10-CM

## 2012-10-15 DIAGNOSIS — R109 Unspecified abdominal pain: Secondary | ICD-10-CM

## 2012-10-15 DIAGNOSIS — K921 Melena: Secondary | ICD-10-CM | POA: Insufficient documentation

## 2012-10-15 DIAGNOSIS — Z79899 Other long term (current) drug therapy: Secondary | ICD-10-CM | POA: Insufficient documentation

## 2012-10-15 DIAGNOSIS — Z933 Colostomy status: Secondary | ICD-10-CM | POA: Insufficient documentation

## 2012-10-15 LAB — COMPREHENSIVE METABOLIC PANEL
ALT: 8 U/L (ref 0–35)
Alkaline Phosphatase: 80 U/L (ref 39–117)
CO2: 26 mEq/L (ref 19–32)
GFR calc Af Amer: >90 mL/min (ref >90)
GFR calc non Af Amer: >90 mL/min (ref >90)
Glucose, Bld: 100 mg/dL — ABNORMAL HIGH (ref 70–99)
Potassium: 3.7 mEq/L (ref 3.5–5.1)
Sodium: 140 mEq/L (ref 135–145)

## 2012-10-15 LAB — CBC WITH DIFFERENTIAL/PLATELET
Lymphocytes Relative: 39 % (ref 12–46)
Lymphs Abs: 2.5 10*3/uL (ref 0.7–4.0)
MCV: 91.7 fL (ref 78.0–100.0)
Neutrophils Relative %: 48 % (ref 43–77)
Platelets: 407 10*3/uL — ABNORMAL HIGH (ref 150–400)
RBC: 3.02 MIL/uL — ABNORMAL LOW (ref 3.87–5.11)
WBC: 6.4 10*3/uL (ref 4.0–10.5)

## 2012-10-15 LAB — URINALYSIS, ROUTINE W REFLEX MICROSCOPIC
Bilirubin Urine: NEGATIVE
Ketones, ur: NEGATIVE mg/dL
Nitrite: NEGATIVE
Urobilinogen, UA: 0.2 mg/dL (ref 0.0–1.0)

## 2012-10-15 NOTE — ED Notes (Signed)
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  Patient with new ostomy site, inflammed.  She states she bent over this afternoon and heard a "pop".  Patient medicated per medication list.  VS stable.

## 2012-10-15 NOTE — ED Provider Notes (Signed)
History     CSN: [id number]  Arrival date & time 10/15/12  2026   First MD Initiated Contact with Patient 10/15/12 2054      Chief Complaint  Patient presents with  . Edema    (Consider location/radiation/quality/duration/timing/severity/associated sxs/prior treatment) The history is provided by the patient.  Abdominal Pain The primary symptoms of the illness include abdominal pain. The primary symptoms of the illness do not include fever, shortness of breath, nausea, vomiting or dysuria.  Associated symptoms comments: She complains of pain and swelling at the site of her colostomy stoma that has been painful and swollen since placement of colostomy on September 03, 2012. She states she followed up with her doctor multiple times and that "he says theres nothing he can do". She states she does not know why a colostomy was necessary, but that this was the second one needed. She has not had a fever, N, V. She has seen blood in her colostomy bag but that "I always see some blood in the bag". .    No past medical history on file.  No past surgical history on file.  No family history on file.  History  Substance Use Topics  . Smoking status: Not on file  . Smokeless tobacco: Not on file  . Alcohol Use: Not on file    OB History    No data available      Review of Systems  Constitutional: Negative for fever.  Respiratory: Negative for cough and shortness of breath.   Cardiovascular: Negative for chest pain.  Gastrointestinal: Positive for abdominal pain and blood in stool. Negative for nausea and vomiting.       See HPI.  Genitourinary: Negative for dysuria.  Musculoskeletal: Negative for myalgias.  Skin: Positive for wound.  Neurological: Negative for syncope and weakness.  Psychiatric/Behavioral: Negative for confusion.    Allergies  Penicillins and Sulfa antibiotics  Home Medications   Current Outpatient Rx  Name  Route  Sig  Dispense  Refill  . DULOXETINE HCL 60  MG PO CPEP   Oral   Take 60 mg by mouth 2 (two) times daily.         Marland Kitchen GABAPENTIN 300 MG PO CAPS   Oral   Take 300 mg by mouth 2 (two) times daily.         Marland Kitchen HYDROMORPHONE HCL 2 MG PO TABS   Oral   Take 2 mg by mouth every 3 (three) hours as needed. For pain         . HYOSCYAMINE SULFATE 0.125 MG PO TABS   Oral   Take 0.125 mg by mouth 3 (three) times daily.         . IBUPROFEN 200 MG PO TABS   Oral   Take 600 mg by mouth every 6 (six) hours as needed. For pain         . LORAZEPAM 0.5 MG PO TABS   Oral   Take 0.5 mg by mouth 2 (two) times daily as needed. For anxiety         . ZOFRAN PO   Oral   Take 1 tablet by mouth as needed. Nausea - up to 6 times daily         . OVER THE COUNTER MEDICATION   Oral   Take 1 tablet by mouth daily as needed. For gas relief         . ROPINIROLE HCL 1 MG PO TABS   Oral  Take 1 mg by mouth 3 (three) times daily.         Willette Brace IJ   Intramuscular   Inject 1 application into the muscle as needed. For headaches - once every 1-3 months         . TOPIRAMATE 50 MG PO TABS   Oral   Take 50 mg by mouth 2 (two) times daily.           BP 112/65  Pulse 95  Temp 97.7 F (36.5 C) (Oral)  Resp 18  SpO2 100%  Physical Exam  Constitutional: She is oriented to person, place, and time. She appears well-developed and well-nourished.  HENT:  Head: Normocephalic.  Eyes:       No conjunctival pallor.  Neck: Normal range of motion. Neck supple.  Cardiovascular: Normal rate and regular rhythm.   Pulmonary/Chest: Effort normal and breath sounds normal.  Abdominal:       LLQ and RLQ colostomies are present. Right colostomy stoma is moderately edematous with bloody liquid in reservoir. Left colostomy has no collection in bag, stoma unremarkable. PEG in place, site unremarkable. Midline lower abdominal surgical incisional scar has 1 cm wound dehiscence with small amount of purulence. No bleeding. No surrounding redness.    Musculoskeletal: Normal range of motion.  Neurological: She is alert and oriented to person, place, and time. No cranial nerve deficit.  Skin: Skin is warm and dry. No rash noted.  Psychiatric: She has a normal mood and affect.    ED Course  Procedures (including critical care time)  Labs Reviewed  CBC WITH DIFFERENTIAL - Abnormal; Notable for the following:    RBC 3.02 (*)     Hemoglobin 9.3 (*)     HCT 27.7 (*)     RDW 16.1 (*)     Platelets 407 (*)     All other components within normal limits  COMPREHENSIVE METABOLIC PANEL - Abnormal; Notable for the following:    Glucose, Bld 100 (*)     Albumin 2.7 (*)     Total Bilirubin 0.2 (*)     All other components within normal limits  URINALYSIS, ROUTINE W REFLEX MICROSCOPIC   Dg Abd Acute W/chest  10/15/2012  *RADIOLOGY REPORT*  Clinical Data: Abdominal pain and bleeding from stoma.  ACUTE ABDOMEN SERIES (ABDOMEN 2 VIEW & CHEST 1 VIEW)  Comparison: None.  Findings: Mild hyperinflation of the lungs suggesting emphysema. Scattered fibrosis in the lungs.  Normal heart size and pulmonary vascularity.  No focal airspace consolidation.  No blunting of costophrenic angles.  No pneumothorax.  Metallic foreign bodies projecting are over the mid abdomen which are chronic by history.  Left and right lower quadrant ostomies. Tubing in the left upper quadrant likely representing a gastric jejunostomy tube. Normal bowel gas pattern with scattered gas and stool in the colon.  No small or large bowel distension.  No free intra-abdominal air.  No abnormal air fluid levels.  No radiopaque stones.  Visualized bones appear intact.  IMPRESSION: Emphysematous changes and scattered fibrosis in the lungs.  No evidence of active pulmonary disease.  Postoperative changes in the abdomen.  Nonobstructive bowel gas pattern.   Original Report Authenticated By: Burman Nieves, M.D.      No diagnosis found.  1. Abdominal pain  MDM  Records requested and obtained  from [facility] and outline recent admission 09-27-12 to 10-05-12 for surgical care of enterocutaneous infection requiring debridement of old colostomy (left lower abdomen) with VAC placed,  removed and antibiotics completed prior to patient insisting on discharge home. Hgb today is improved over recent history of 8.1 on 10/11/12. VSS. She is sleeping on re-examination and appears comfortable. She has appropriate outpatient follow up with no acute process indicated in evaluation tonight. She is stable for discharge home.        Arnoldo Hooker, PA-C 10/16/12 0021  Arnoldo Hooker, PA-C 10/16/12 [phone]

## 2012-10-15 NOTE — ED Notes (Addendum)
Pt reports getting RLQ colostomy on around Christmas Day 2013; pt states that since she got her colostomy she has been having constant cramping and pressure to her lower abdomen; ostomy noted to be red and inflamed; pt states she has noticed ostomy getting enlarged over the past couple of weeks; states she last saw her doctor about a week or two ago "I can't remember. I don't like him and don't want to tell you his name."  Pt also noted to be having yellow drainage around old ostomy pouch LLQ; pt states she has had this drainage since it was closed around Christmas; pt had surgery at [facility];

## 2012-10-16 ENCOUNTER — Encounter (HOSPITAL_COMMUNITY): Payer: Self-pay

## 2012-10-16 NOTE — ED Notes (Signed)
Pt asleep.

## 2012-10-16 NOTE — ED Notes (Signed)
Pt given discharge paperwork; pt verbalized understanding of discharge; no additional questions by pt; e-signature obtained; VSS; 

## 2012-10-18 NOTE — ED Provider Notes (Signed)
Medical screening examination/treatment/procedure(s) were performed by non-physician practitioner and as supervising physician I was immediately available for consultation/collaboration.   Jaquavion Mccannon L Jaylend Reiland, MD 10/18/12 1444 

## 2012-10-26 ENCOUNTER — Ambulatory Visit: Payer: Self-pay | Admitting: Surgery

## 2012-10-29 ENCOUNTER — Emergency Department: Payer: Self-pay | Admitting: Emergency Medicine

## 2012-11-16 ENCOUNTER — Ambulatory Visit: Payer: Self-pay | Admitting: Surgery

## 2012-11-16 LAB — BASIC METABOLIC PANEL
Anion Gap: 4 — ABNORMAL LOW (ref 7–16)
BUN: 6 mg/dL — ABNORMAL LOW (ref 7–18)
Calcium, Total: 8.8 mg/dL (ref 8.5–10.1)
Chloride: 107 mmol/L (ref 98–107)
EGFR (African American): >60
EGFR (Non-African Amer.): >60
Glucose: 96 mg/dL (ref 65–99)
Osmolality: 271 (ref 275–301)
Sodium: 137 mmol/L (ref 136–145)

## 2012-11-16 LAB — CBC WITH DIFFERENTIAL/PLATELET
Basophil #: 0 10*3/uL (ref 0.0–0.1)
Basophil %: 0.6 %
Eosinophil #: 0.1 10*3/uL (ref 0.0–0.7)
HCT: 36.9 % (ref 35.0–47.0)
Lymphocyte #: 2.5 10*3/uL (ref 1.0–3.6)
Lymphocyte %: 38.9 %
MCH: 30.9 pg (ref 26.0–34.0)
Monocyte #: 0.3 x10 3/mm (ref 0.2–0.9)
Monocyte %: 5.2 %
Neutrophil #: 3.4 10*3/uL (ref 1.4–6.5)
Neutrophil %: 54.2 %
Platelet: 282 10*3/uL (ref 150–440)
RBC: 3.96 10*6/uL (ref 3.80–5.20)
RDW: 16 % — ABNORMAL HIGH (ref 11.5–14.5)
WBC: 6.3 10*3/uL (ref 3.6–11.0)

## 2013-02-18 ENCOUNTER — Ambulatory Visit: Payer: Self-pay | Admitting: Surgery

## 2013-02-21 ENCOUNTER — Ambulatory Visit: Payer: Self-pay | Admitting: Surgery

## 2013-04-28 ENCOUNTER — Ambulatory Visit: Payer: Self-pay | Admitting: Family Medicine

## 2013-08-10 ENCOUNTER — Inpatient Hospital Stay: Payer: Self-pay | Admitting: Surgery

## 2013-08-10 LAB — COMPREHENSIVE METABOLIC PANEL
Alkaline Phosphatase: 80 U/L
Bilirubin,Total: 0.3 mg/dL (ref 0.2–1.0)
Calcium, Total: 9.1 mg/dL (ref 8.5–10.1)
Co2: 27 mmol/L (ref 21–32)
EGFR (African American): >60
Glucose: 103 mg/dL — ABNORMAL HIGH (ref 65–99)
Osmolality: 281 (ref 275–301)
Sodium: 141 mmol/L (ref 136–145)
Total Protein: 6.8 g/dL (ref 6.4–8.2)

## 2013-08-10 LAB — CBC WITH DIFFERENTIAL/PLATELET
Basophil #: 0.1 10*3/uL (ref 0.0–0.1)
Basophil %: 0.8 %
Eosinophil #: 0.1 10*3/uL (ref 0.0–0.7)
HGB: 11.2 g/dL — ABNORMAL LOW (ref 12.0–16.0)
Lymphocyte #: 2.5 10*3/uL (ref 1.0–3.6)
Lymphocyte %: 35.3 %
MCH: 32 pg (ref 26.0–34.0)
MCV: 94 fL (ref 80–100)
Monocyte %: 5.8 %
RDW: 13.7 % (ref 11.5–14.5)

## 2013-08-11 LAB — COMPREHENSIVE METABOLIC PANEL
Albumin: 2.8 g/dL — ABNORMAL LOW (ref 3.4–5.0)
Anion Gap: 6 — ABNORMAL LOW (ref 7–16)
BUN: 11 mg/dL (ref 7–18)
Calcium, Total: 8.4 mg/dL — ABNORMAL LOW (ref 8.5–10.1)
Chloride: 108 mmol/L — ABNORMAL HIGH (ref 98–107)
Co2: 26 mmol/L (ref 21–32)
Creatinine: 0.66 mg/dL (ref 0.60–1.30)
EGFR (African American): >60
EGFR (Non-African Amer.): >60
Glucose: 122 mg/dL — ABNORMAL HIGH (ref 65–99)
SGOT(AST): 20 U/L (ref 15–37)
Sodium: 140 mmol/L (ref 136–145)

## 2013-08-11 LAB — CBC WITH DIFFERENTIAL/PLATELET
Basophil #: 0 10*3/uL (ref 0.0–0.1)
Eosinophil #: 0 10*3/uL (ref 0.0–0.7)
Eosinophil %: 0.4 %
Lymphocyte #: 0.8 10*3/uL — ABNORMAL LOW (ref 1.0–3.6)
Lymphocyte %: 8 %
MCH: 32.6 pg (ref 26.0–34.0)
MCHC: 34.1 g/dL (ref 32.0–36.0)
MCV: 96 fL (ref 80–100)
RBC: 3.44 10*6/uL — ABNORMAL LOW (ref 3.80–5.20)
RDW: 13.6 % (ref 11.5–14.5)

## 2013-08-11 LAB — MAGNESIUM: Magnesium: 1.7 mg/dL — ABNORMAL LOW

## 2013-08-15 LAB — PATHOLOGY REPORT

## 2013-11-25 LAB — CBC
HCT: 41 % (ref 35.0–47.0)
HGB: 13.5 g/dL (ref 12.0–16.0)
MCH: 31.3 pg (ref 26.0–34.0)
MCHC: 32.9 g/dL (ref 32.0–36.0)
MCV: 95 fL (ref 80–100)
Platelet: 270 10*3/uL (ref 150–440)
RBC: 4.29 10*6/uL (ref 3.80–5.20)
RDW: 14.3 % (ref 11.5–14.5)
WBC: 8.2 10*3/uL (ref 3.6–11.0)

## 2013-11-25 LAB — DRUG SCREEN, URINE
Amphetamines, Ur Screen: NEGATIVE (ref ?–1000)
BENZODIAZEPINE, UR SCRN: NEGATIVE (ref ?–200)
Barbiturates, Ur Screen: NEGATIVE (ref ?–200)
CANNABINOID 50 NG, UR ~~LOC~~: POSITIVE (ref ?–50)
Cocaine Metabolite,Ur ~~LOC~~: NEGATIVE (ref ?–300)
MDMA (ECSTASY) UR SCREEN: NEGATIVE (ref ?–500)
Methadone, Ur Screen: NEGATIVE (ref ?–300)
Opiate, Ur Screen: NEGATIVE (ref ?–300)
Phencyclidine (PCP) Ur S: NEGATIVE (ref ?–25)
Tricyclic, Ur Screen: NEGATIVE (ref ?–1000)

## 2013-11-25 LAB — ACETAMINOPHEN LEVEL: Acetaminophen: <2 ug/mL

## 2013-11-25 LAB — ETHANOL
ETHANOL %: 0.167 % — AB (ref 0.000–0.080)
Ethanol: 167 mg/dL

## 2013-11-25 LAB — COMPREHENSIVE METABOLIC PANEL
ALBUMIN: 4.4 g/dL (ref 3.4–5.0)
ALT: 19 U/L (ref 12–78)
ANION GAP: 5 — AB (ref 7–16)
Alkaline Phosphatase: 96 U/L
BILIRUBIN TOTAL: 0.2 mg/dL (ref 0.2–1.0)
BUN: 6 mg/dL — ABNORMAL LOW (ref 7–18)
Calcium, Total: 9.1 mg/dL (ref 8.5–10.1)
Chloride: 114 mmol/L — ABNORMAL HIGH (ref 98–107)
Co2: 25 mmol/L (ref 21–32)
Creatinine: 0.7 mg/dL (ref 0.60–1.30)
EGFR (African American): >60
EGFR (Non-African Amer.): >60
Glucose: 90 mg/dL (ref 65–99)
Osmolality: 284 (ref 275–301)
POTASSIUM: 4.2 mmol/L (ref 3.5–5.1)
SGOT(AST): 26 U/L (ref 15–37)
Sodium: 144 mmol/L (ref 136–145)
Total Protein: 8.2 g/dL (ref 6.4–8.2)

## 2013-11-25 LAB — SALICYLATE LEVEL: SALICYLATES, SERUM: 4.9 mg/dL — AB

## 2013-11-25 LAB — URINALYSIS, COMPLETE
BLOOD: NEGATIVE
Bilirubin,UR: NEGATIVE
Glucose,UR: NEGATIVE mg/dL (ref 0–75)
Ketone: NEGATIVE
LEUKOCYTE ESTERASE: NEGATIVE
NITRITE: NEGATIVE
Ph: 6 (ref 4.5–8.0)
Protein: NEGATIVE
RBC, UR: NONE SEEN /HPF (ref 0–5)
SPECIFIC GRAVITY: 1.003 (ref 1.003–1.030)
Squamous Epithelial: 1

## 2013-11-27 ENCOUNTER — Inpatient Hospital Stay: Payer: Self-pay | Admitting: Surgery

## 2014-05-08 ENCOUNTER — Ambulatory Visit: Payer: Self-pay | Admitting: Family Medicine

## 2014-10-13 ENCOUNTER — Emergency Department: Payer: Self-pay | Admitting: Student

## 2014-10-13 LAB — CBC
HCT: 39.2 % (ref 35.0–47.0)
HGB: 13.2 g/dL (ref 12.0–16.0)
MCH: 32.3 pg (ref 26.0–34.0)
MCHC: 33.7 g/dL (ref 32.0–36.0)
MCV: 96 fL (ref 80–100)
Platelet: 247 10*3/uL (ref 150–440)
RBC: 4.08 10*6/uL (ref 3.80–5.20)
RDW: 13.7 % (ref 11.5–14.5)
WBC: 8.9 10*3/uL (ref 3.6–11.0)

## 2014-10-13 LAB — BASIC METABOLIC PANEL
ANION GAP: 8 (ref 7–16)
BUN: 14 mg/dL (ref 7–18)
CALCIUM: 8.9 mg/dL (ref 8.5–10.1)
CO2: 20 mmol/L — AB (ref 21–32)
Chloride: 112 mmol/L — ABNORMAL HIGH (ref 98–107)
Creatinine: 0.79 mg/dL (ref 0.60–1.30)
EGFR (African American): >60
EGFR (Non-African Amer.): >60
GLUCOSE: 96 mg/dL (ref 65–99)
OSMOLALITY: 280 (ref 275–301)
Potassium: 3.5 mmol/L (ref 3.5–5.1)
SODIUM: 140 mmol/L (ref 136–145)

## 2014-10-13 LAB — TROPONIN I: Troponin-I: <0.02 ng/mL

## 2015-01-02 NOTE — Consult Note (Signed)
Brief Consult Note: Diagnosis: 1. Suspected Hypothyroidism 2. Bipolar Disorder 3. Incarcerated Parastomal hernia s/p exp. lap POD # 7 4. GERD.   Consult note dictated.   Orders entered.   Discussed with Attending MD.   Comments14: 49 yo female w/ hx of Biplar disorder (Schizophrenia), GERD, Incarcerated Parastomal Hernia s/p exp. Lap POD # 7 noted to have slightly elevated TSH.   1. Elevated TSH/Suspected Hypothyroidism - pt. cannot really give me a good hx.   - likely this euthyroid sick syndrome and does not require treatment.  - will check Free T3, total T4 and monitor.   2. Bipolar Disorder - cont. care as per Psych.   3. GERD - cont. Protonix.   4. Incarcerated Parastomal Hernia s/p exp. Lap POD # 7 - cont. care as per surgery.   Thanks for the consult and will follow with you.  Job # [id number].  Electronic Signatures: [place] SirenSainani, Joannie Medine J (MD)  (Signed 31-Dec-13 18:56)  Authored: Brief Consult Note   Last Updated: 31-Dec-13 18:56 by [place] SirenSainani, Nithila Sumners J (MD)

## 2015-01-02 NOTE — Consult Note (Signed)
Details:    - Psychiatry: Followup for this patient with bipolar disorder and personality disorder who is currently being treated on the surgical service for bowel obstruction. Patient was awake when I came into the room. She was agitated and confused. Very little of what she said made sense. She seemed to be of a labile affect. She was unable to identify where she was. I am told by staff that she was extremely confused earlier today as well. Patient is presenting as being delirious. This is despite currently getting her normally prescribed outpatient psychiatric medicine. I'm not sure if this delirium is part of her current medical condition. I also noticed that it doesn't look like a drug screen was done on admission. It's possible that what we are seeing could be some withdrawal as well.  I am going to continue the Seroquel orally as it is ordered. I have discontinued the standing order for IM Geodon but continued to p.r.n. order. I have put in an order for standing doses of 2 mg of Ativan and 2 mg of Haldol IV q.4 hours for now. My hope is that this will calm down her delirium and may calm down any withdrawal she is having i as well. Continue liberal p.r.n. medication if needed for safety. Tried to do medication education and supportive therapy with the patient today she is too confused at this time.   Electronic Signatures: Audery Amellapacs, John T (MD)  (Signed 31-Dec-13 18:25)  Authored: Details   Last Updated: 31-Dec-13 18:25 by Audery Amellapacs, John T (MD)

## 2015-01-02 NOTE — H&P (Signed)
PATIENT NAME:  Kim [name], [name] MR#:  [id number] DATE OF BIRTH:  03/29/1966  DATE OF ADMISSION:  09/07/2012  PRIMARY CARE PHYSICIAN:  Burley SaverL. Katherine Bliss, MD  CHIEF COMPLAINT:  Abdominal pain.   BRIEF HISTORY:  The patient is a [age] year old woman with a long-standing psychiatric history seen in the Emergency Room with abdominal pain and increasing symptoms over the last 48 hours. She was admitted in March of 2012 for colonic perforation felt to be secondary to diverticulitis. She underwent a Hartmann's procedure with end colostomy. She had a very difficult postoperative time with her pain medications and her psychotropic drugs. Psychiatry was involved. She has a long-standing history of gastric paresis. She had difficulty with nausea post surgery. [facility] and has been [facility] several times in the interim. They have been making a decision about whether or not to reverse her ostomy. She presented to [facility] 48 hours ago with new onset of pain and swelling around her ostomy. She underwent a CT scan at that time which demonstrated a parastomal hernia and she was diagnosed as having colitis and placed on Cipro and Flagyl. She had increasing pain over the next 48 hours to where the pain became so severe today that it took her breath away and she began to become very tremulous. She called the ambulance and came to the Emergency Room at [facility].   Workup revealed a slightly elevated white blood cell count with significant erythema and tenderness in her abdomen. CT scan without p.o. contrast demonstrated contrast from a previous CT scan in her colon and what appeared to be a parastomal hernia with inflammatory change and free air suggestive of strangulation and possible perforation. Surgical service was consulted.   She has a history of alcohol abuse and chronic obstructive lung disease from tobacco abuse and continues to smoke. She has history of  bipolar disorder, hepatitis, fibromyalgia and migraine headaches. Previous surgeries other than Hartmann's procedure include an appendectomy, tubal ligation, hysterectomy and multiple lacerations.   ALLERGIES:  PENICILLIN AND SULFA DRUGS.   CURRENT MEDICATIONS:   1.  Cymbalta 60 mg p.o. daily.  2.  Cipro 500 mg q. 12 hours.  3.  Flagyl 500 mg b.i.d.  4.  Requip 1 mg p.o. t.i.d.  5.  Sumatriptan succinate 6 mg injection p.r.n.  6.  Topiramate 50 mg twice a day.  7.  Flonase and DuoNebs as necessary.   PHYSICAL EXAMINATION: GENERAL: She is a thin, cachectic-appearing woman with significant tremulousness, anxiety and appears in significant distress from pain.  VITAL SIGNS: Heart rate is 130 and regular, blood pressure is 128/70 and oxygen saturation is 92% on room air.  HEENT: Sweaty, diaphoretic with no pupillary abnormalities or scleral icterus.  NECK: Supple without adenopathy and her trachea is midline.  CHEST: Clear. No adventitious sounds. She has normal pulmonary excursion.  CARDIAC: No murmurs or gallops to my ear and she seems to be in normal sinus rhythm.  ABDOMEN: Tense and distended with large erythematous change over the midline extending from the site of ostomy.  Her ostomy is edematous and protruding. She has marked rebound and guarding in both lower quadrants. I cannot really touch her parastomal area to define the hernia as she is so uncomfortable.  LOWER EXTREMITY EXAM: Reveals full range of motion, no deformities, decreased distal pulses.  PSYCHIATRIC: Reveals marked agitation, significantly altered affect and normal orientation. She has been on multiple medications since hospitalized  to control her pain.   I have independently reviewed her CT scan. There does appear to be contrast in the distal colon, and it was a little difficult to identify the loops of bowel in the ostomy, but there does appear to be a parastomal hernia. With the inflammatory change, free air and erythema  across the abdominal wall, the risk of perforation and contamination of the abdomen must be considered. The ostomy is edematous and blotchy appearing which does suggest that there is some entrapment with compromise of the vascular supply.   Laboratory values revealed slightly elevated white blood cell count of 13,700. Electrolytes are largely unremarkable. Lipase is normal. She is not acidotic.   ASSESSMENT AND PLAN:  Working diagnosis here would be incarcerated parastomal hernia with strangulation and possible perforation. With the edematous abdominal wall, significant erythema, marked pain, elevated white blood cell count and a history of 48 hours of discomfort, we will take her urgently to surgery for further evaluation. We will plan laparotomy, reduction of the hernia, resection of any necrotic or compromised bowel and possible ostomy relocation. This plan has been discussed with the patient and she is in agreement.    ____________________________ Carmie End, MD rle:si D: 09/07/2012 22:34:26 ET T: 09/07/2012 23:22:33 ET JOB#: [id number]  cc: Carmie End, MD, <Dictator> Burley Saver, MD Quentin Ore MD ELECTRONICALLY SIGNED 09/08/2012 2:44

## 2015-01-02 NOTE — Consult Note (Signed)
PATIENT NAME:  Kim [name], Kim [name] MR#:  [id number] DATE OF BIRTH:  1966-02-04  DATE OF CONSULTATION:  09/09/2012  CONSULTING PHYSICIAN:  Audery AmelJohn T. Jaydrien Wassenaar, MD  IDENTIFYING INFORMATION AND REASON FOR CONSULT: This is a 49 year old woman admitted to the hospital with return of pain in her ostomy area. Consultation because of history of bipolar disorder and increasingly erratic behavior.   HISTORY OF PRESENT ILLNESS: Information obtained from the chart and the patient. The patient was extremely focused on her pain and was not able to give me very much useful information. She told me that she was having terrible pain in her abdomen. She said she was not able to eat or drink and had been throwing up. She was feeling angry and upset. She told me that she intended to leave the hospital and go home. She could not give a coherent answer to how that was going to help with her problem and acknowledge that she did not have pain medicine at home but still was saying that she wanted to leave the hospital. She denied having auditory or visual hallucinations. Reported her mood as just being very upset currently. Said that the pain started back up this past Saturday and has rapidly reached a new intensity. The patient claims that she has been compliant with her psychiatric medicines at home, but she cannot remember exactly what they are. She gets mixed up trying to tell me the Cymbalta and the Seroquel and cannot remember the doses and does not seem to really know which one she takes at which time of day. She says that she is still following up with Dr. Megan MansLitke through the ACT team.   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 She has a long history of self-mutilation of serious suicide attempts at times in the past, has multiple hospitalizations. When she was last here at our hospital for  an extended stay in 11/2010, she needed psychiatric management. It appears looking back through her chart like she frequently has trouble with her behavior in the hospital with getting abusive and being noncompliant at times. Her medications as of when she was released from the hospital last time were Seroquel 300 mg at night and 50 mg 3 times a day, Cymbalta 60 mg a day, Topamax 50 mg every 12 hours, and Ativan 2 mg p.r.n.   SOCIAL HISTORY: The patient tells me that she lives by herself. Says that she does not have any family members at all in her life or anyone who helps her. She does have the ACT team who come and see her regularly. She claims that until this last Saturday, she felt like she was doing fine and was taking care of herself well.   SUBSTANCE ABUSE HISTORY: The patient has a history of abuse of alcohol and multiple other drugs. Claims that she has not been drinking or using any drugs at all recently.   PAST MEDICAL HISTORY: The patient presented with a perforated bowel in 2012 resulting in surgery leading to the current ostomy. She presented this time to the hospital with abdominal pain. She has a history of migraine headaches. A history of some degree of COPD.   CURRENT MEDICATIONS: Listed on admission was Cymbalta 60 mg per day, ciprofloxacin 500 mg q.12 hours, Flagyl 500 mg twice a day, Requip 1 mg 3 times a day, sumatriptan 6 mg injection p.r.n., Topamax 50  mg twice a day, Flonase and DuoNebs as necessary.   ALLERGIES: PENICILLIN, SULFA DRUGS.   REVIEW OF SYSTEMS: Overwhelmingly complaining of pain in her abdomen. Not able to talk clearly enough to be specific about it. Also says that she cannot eat or drink. Also complains of feeling angry and upset.   MENTAL STATUS EXAMINATION: A chronically ill-appearing woman interviewed in her hospital room. She was only partially compliant. When I first walked in, she was the most compliant with the interview which gradually deteriorated from  there. Eye contact was intermittent at best. Psychomotor activity was fidgety and agitated, she was moving around in bed constantly trying to get herself more comfortable. Speech was loud at times and she began shouting later in the interview and crying loudly in pain. Affect was in great distress for the most part. Mood was stated as being angry. Thoughts were somewhat loose and entirely distracted by her physical condition currently. Denied hallucinations. Denied suicidal or homicidal ideation. Judgment and insight poor. Baseline intelligence probably low average. Short and long term memory both show signs of impairment.   ASSESSMENT: A 49 year old woman with bipolar disorder and personality disorder with a history of dramatically poor judgment and difficult behavior in the hospital. Certainly needs to be on medication to control her current behavior. In my judgment, she was not able to give a lucid reason at all about saying she was going to leave the hospital and I do not think she is competent to make a decision to do that at this time. I think that she needs to be back on more psychiatric medicine than what she is taking.   TREATMENT PLAN: Because she is not taking p.o. right now, I will, instead of restarting the Seroquel, put her on Geodon 20 mg twice a day IM. I will not substitute anything at this time for the Cymbalta. I also ordered STAT shots of Ativan and Phenergan. I put in standing orders for 2 mg of Ativan 3 times a day for the time being to help keep her calmed down. P.r.n. orders still stand. I see that she is getting she a narcotic drip and it looks like she also has a PCA option. I pointed it out to the patient and asked her about it and she was completely surprised that she was getting any pain medicine. She denied any knowledge of it. She apparently had not been using her PCA at all. She may not be mentally fit to use it right now and that might be taken into account when giving her pain  medicine.   DIAGNOSIS PRINCIPLE AND PRIMARY:  AXIS I: Bipolar disorder, not otherwise specified.   SECONDARY DIAGNOSES:  AXIS I: Polysubstance dependence, in remission.  AXIS II: Borderline personality disorder.  AXIS III: Return of colitis, does history of perforated bowel, chronic obstructive pulmonary disease, mild migraine headaches, fibromyalgia.  AXIS IV: Severe from acute illness.  AXIS V: Functioning at time of evaluation 20.  ____________________________ Audery Amel, MD jtc:jm [name]: 09/09/2012 16:38:56 ET T: 09/09/2012 18:14:02 ET JOB#: [id number]  cc: Audery Amel, MD, <Dictator> Audery Amel MD ELECTRONICALLY SIGNED 09/10/2012 15:18

## 2015-01-02 NOTE — Consult Note (Signed)
Brief Consult Note: Diagnosis: bipolar disorder nos.   Patient was seen by consultant.   Consult note dictated.   Recommend to proceed with surgery or procedure.   Recommend further assessment or treatment.   Orders entered.   Comments: Psychiatry: Patient seen. Reviewed chart and old records. Patient with hx of bipolar disorder. Hx of decompensating in the hospital under stress of medical problems and hosp treatment. Today she is crying and complaining bitterly of pain.  Complaints get worse the more we talk. She is talking about planning to go home today even thought she says she cant eat or drink. and believes she needs further treatment. Her talk of going home is irrational and I dont think she has the capacity to make that decision. She is a poor historian about her meds but in the past was on Seroquel 400mg  at night and 50mg  tid. This wasn't given here. Since she can't or wont take pills I'll substitute Geodon 20mg  im STAT and q12 for now and also make the Ativan 2 mg q8 standing iv for now to help control her agitation. Can't really effectively substitute for the cymbalta im or iv. Leave that for now until she is taking po. Will follow.  Electronic Signatures: Clapacs, Jackquline DenmarkJohn T (MD)  (Signed 26-Dec-13 13:03)  Authored: Brief Consult Note   Last Updated: 26-Dec-13 13:03 by Audery Amellapacs, John T (MD)

## 2015-01-02 NOTE — Op Note (Signed)
PATIENT NAME:  Kim AmabileFORBIS, [name] MR#:  [id number] DATE OF BIRTH:  12-12-1965  DATE OF PROCEDURE:  09/10/2012  PREOPERATIVE DIAGNOSIS: Incarcerated parastomal hernia with ischemic colon and perforation.   POSTOPERATIVE DIAGNOSIS: Incarcerated parastomal hernia with ischemic colon and perforation.  PROCEDURE PERFORMED: Wound VAC change.   SURGEON: Quentin Orealph L. Ely, III, MD.   ANESTHESIA: General.   PROCEDURE IN DETAIL: With the patient in the supine position after the induction of appropriate general anesthesia, the patient's abdomen was prepped with Betadine and then wiped with alcohol. The abdomen was then dried. Ioban drapes were placed across the entire abdomen.  Wound VAC drapes were placed in the appropriate position and foam placed in the previous ostomy site and in the incision. The mesh was secured and bridged. A wound VAC was attached and sealed, appropriately. A new canister was placed in addition. The ostomy was then replaced. The patient was returned to the recovery room having tolerated the procedure well. Sponge, instrument, and needle counts were correct x 2 in the Operating Room.   ____________________________ Quentin Orealph L. Ely III, MD rle:eg D: 09/10/2012 21:26:57 ET T: 09/10/2012 23:11:11 ET JOB#: [id number]  cc: Quentin Orealph L. Ely III, MD, <Dictator> Quentin OreALPH L ELY MD ELECTRONICALLY SIGNED 09/11/2012 19:55

## 2015-01-02 NOTE — Op Note (Signed)
PATIENT NAME:  Kim [name], Kim [name] MR#:  [id number] DATE OF BIRTH:  [birth year]  DATE OF PROCEDURE:  09/07/2012  PREOPERATIVE DIAGNOSIS: Incarcerated parastomal hernia.   POSTOPERATIVE DIAGNOSIS: Incarcerated parastomal hernia with strangulation and perforation.   SURGERY:  1.  Exploratory laparotomy.  2.  Reduction parastomal hernia.  3.  Colon resection.  4.  Translocation colostomy. 5.  Central line placement.   SURGEON: Kim [name], M.D.   ANESTHESIA: General.   OPERATIVE PROCEDURE: With the patient in the supine position after the induction of appropriate general anesthesia, the patient's abdomen was prepped with Betadine and draped with sterile towels. An alcohol wipe Betadine impregnated Steri-Drape were utilized. Foley catheter had previously been placed. A midline incision was reopened and extended the length of the previous incision with slight increase in the superior end in order to allow access to urgent abdomen. The incision was carried down through the subcutaneous tissue with Bovie electrocautery. Midline fascia was identified and opened the length of the skin incision as was the peritoneum. Entering the abdomen revealed a large amount of edematous omentum. The appeared to be omentum and a loop of colon stuck alongside the ostomy in the ostomy site. The bowel was reduced. There was an obvious perforation and, what appeared to be, ischemic bowel. I divided the colon using the GIA-55 stapling device isolating the ostomy segment. I then went back and made an elliptical incision around the ostomy and dissected down to the fascia removing the stoma and the necrotic bowel. The specimen was passed off the table. The area was then packed. The abdomen was re-explored. There were multiple adhesions, but there did not appear to be any other evidence of ischemic bowel. There was a lot of inflammatory reaction in the pelvis and in the splenic flexure area. I elected to relocate the ostomy to the right  lower quadrant. An area on the ascending colon was utilized for the ostomy. The bowel was mobilized and in that area. A skin incision was made a circular fashion, carried down through the subcutaneous tissue with electrocautery. A cruciate incision was made in the fascia and the abdomen bluntly entered by separating the muscle. Ostomy site was dilated to 2 fingerbreadths and then the selective bowel brought through the abdominal wall. It was secured using a plastic bridge while the remainder of the operation was undertaken. The abdomen was then irrigated with 5 L of warm saline solution to wash out any remaining inflammatory change. Examining the pelvis identified that the distal bowel in the pelvis appeared to be intact, but covered with a inflammatory fibrinous exudate.   Because of her chronic gastroparesis problems with nasogastric tube during her last hospitalization, I elected to place a Moss tube, which was placed in the standard fashion directed into the lower stomach wall and then into the small intestine around the duodenal loop. It was secured with 3-0 silk to the anterior abdominal wall. It was then secured to the skin with 3-0 nylon. The previous ostomy site was closed using 0 Maxon figure-of-eight sutures. No attempt was made to close the skin. The colostomy was then matured using 3-0 Vicryl suture in a standard fashion. The abdomen was then irrigated further. The midline fascia was closed using running 0 looped PDS from each end tying them in the middle. Wound VAC was then placed bridging the midline defect and the previous ostomy site. A seal was accomplished. A sterile environment was maintained and then the ostomy was covered with an ostomy bag.  The patient's position was then moved to provide for access to the right neck. Gown and gloves were changed, site re-prepped and draped and right internal jugular vein line placed under ultrasound guidance. The line was secured with 3-0 silk to the neck  and sterilely dressed. All ports were flushed. Sterile dressing was applied. The patient was returned to the recovery room having tolerated the procedure reasonably well. Sponge, instrument, and needle counts were correct x2 in the operating room.    ____________________________ Kim Orealph L. Ely III, MD rle:aw D: 09/08/2012 02:37:00 ET T: 09/08/2012 05:37:35 ET JOB#: [id number]  cc: Kim Endalph L. Ely III, MD, <Dictator> Kim SaverL. Katherine Bliss, MD Kim OreALPH L ELY MD ELECTRONICALLY SIGNED 09/10/2012 21:53

## 2015-01-02 NOTE — Consult Note (Signed)
PATIENT NAME:  Kim [name], Kim [name] MR#:  [id number] DATE OF BIRTH:  June 10, 1966  DATE OF CONSULTATION:  09/14/2012  CONSULTING PHYSICIAN:  Loraine Leriche A. Egbert Garibaldi, MD.  PRIMARY CARE PHYSICIAN:  Burley Saver, MD.   REASON FOR CONSULTATION: Elevated TSH.   HISTORY OF PRESENT ILLNESS: This is a 49 year old female who was been in the hospital now for about seven days, admitted to the surgical service for an incarcerated peristomal hernia, status post exploratory laparotomy and surgery. She was noted to have an elevated TSH. Hospitalist service was contacted for treatment and management. The patient is currently quite delirious, hallucinating and therefore difficult to get a good history from the patient.   REVIEW OF SYSTEMS:  Unavailable given the patient's mental status.   PAST MEDICAL HISTORY:  1.  Diabetes. 2.  Schizophrenia. 3.  Bipolar disorder. 4.  GERD.   ALLERGIES:  PENICILLIN and SULFA.  SOCIAL HISTORY: Does smoke about one pack or two per day and has done so for the past 20+ years. No alcohol abuse. No illicit drug abuse.   FAMILY HISTORY: Unobtainable.   CURRENT MEDICATIONS: Cymbalta 60 mg daily; DuoNebs, as needed; ciprofloxacin 5 mg b.i.d.; Flagyl 5 mg b.i.d.; Haldol 0.5 mg q.8 hours, as needed; ibuprofen 800 mg q.8 hours, as needed; insulin Glargine 50 units at bedtime; Norco 5/325, 1 tab q.6 hours, as needed; Requip 1 mg t.i.d.; Topamax 50 mg b.i.d.; Zofran 8 mg disintegrating tablet t.i.d., as needed; Flonase 1 spray to each nostril daily.     PHYSICAL EXAMINATION:   Presently, VITAL SIGNS: Temperature 98.1, pulse 117, respirations 19, blood pressure 130/75. Saturations 90% on room air.  GENERAL: The patient is a delirious female, sometimes in tears, confused, but in no apparent distress.  HEENT: Atraumatic, normocephalic. Extraocular muscles are intact. Pupils equal round and reactive to light. Sclerae anicteric. No conjunctival injection. No parenteral erythema.  NECK: Supple. No  jugulovenous distention, no bruits, no lymphadenopathy, no thyromegaly.  HEART: Regular rhythm. Tachycardic. No murmurs, rubs, or clicks.  LUNGS: She has some coarse crackles, bilaterally. Negative use of accessory muscles. No dullness to percussion.  ABDOMEN: Soft, nontender, nondistended. Hypoactive bowel sounds. No hepatosplenomegaly appreciated.  EXTREMITIES: No evidence of any cyanosis, clubbing, or peripheral edema. Has +2 pedal and radial pulses, bilaterally.  NEUROLOGIC: She is alert, awake, and oriented x 1. Difficult to do full neurological exam, but moves all extremities spontaneously.  SKIN: Moist and warm with no rashes.  LYMPHATIC: There is no cervical or axillary lymphadenopathy.    DIAGNOSTIC DATA: Glucose 100, BUN 8, creatinine 0.6, sodium 144, potassium 3.6, chloride 115, bicarbonate 18. TSH 6.8. White cell count 19.3, hemoglobin 8.9, hematocrit 26.3, platelet count of 461.   ASSESSMENT AND PLAN: This is a 49 year old female with past medical history of bipolar disorder/schizophrenia.  Gastroesophageal reflux disease.   Diabetes.  Incarcerated parastomal hernia, status post exploratory laparotomy, postoperative day #7. Noted to have an elevated TSH.   1.  Elevated TSH/suspected hypothyroidism. The patient cannot really give me a good history, therefore difficult to ascertain any symptoms related to hypothyroidism presently. I suspect this is likely euthyroid sick syndrome and does not really require any treatment presently, since she has been hospitalized now for over one week, secondary to the bowel perforation. I will go ahead and check her free T3 and total T4 for now and follow. If they appear to be normal, likely a TSH can be checked in the next three months, as this is likely to be euthyroid  sick syndrome.  2.  Bipolar disorder/schizophrenia. I would continue care as per psychiatry. Dr. Toni Amendlapacs has seen the patient and has made some recommendations.  3.  Gastroesophageal reflux  disease. Continue Protonix.  4.  Incarcerated peristomal hernia, status post exploratory laparotomy, postoperative day #7.  Continue care as per general surgery.   Thank you so much for the consultation. Will follow along with you.   Time spent with the consult: 45 minutes.     ____________________________ Rolly PancakeVivek J. Cherlynn KaiserSainani, MD vjs:eg D: 09/14/2012 18:57:13 ET T: 09/14/2012 19:43:21 ET JOB#: [id number]  cc: Rolly PancakeVivek J. Cherlynn KaiserSainani, MD, <Dictator> Houston SirenVIVEK J Candi Profit MD ELECTRONICALLY SIGNED 09/14/2012 21:00

## 2015-01-02 NOTE — Consult Note (Signed)
Details:    - Psychiatry: Came by to see patient today. When I walked into the room she began moaning and stating she was in terrible pain. Started to thrash around more in her bed. She did not make much sense and was not able to carry on a lucid conversation or answer any of my questions. Interestingly, the nursing staff tells me that she had been behaving fine all day until I walked into the room. I did not do anything to agitate her but only walked in when she started moaning. This does suggest that she may be looking for something of an audience for her behavior. I note that she is now taking her pills by mouth. Because of this I'm going to discontinue the Geodon injections and replace it with Seroquel 300 mg at night and 50 mg 3 times a day as she was doing previously. Continue to monitor. Hopefully she will shows stable behavior and cooperate with treatment.   Electronic Signatures: Clapacs, Jackquline DenmarkJohn T (MD)  (Signed 27-Dec-13 20:26)  Authored: Details   Last Updated: 27-Dec-13 20:26 by Audery Amellapacs, John T (MD)
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
## 2015-01-05 NOTE — Consult Note (Signed)
Details:    - Psychiatry: Came by to see patient again today. She was awake and alert and generally cooperative with the interview. She was alert and oriented better than I have seen her before. She appears to be generally cooperative with treatment. On the other hand her insight and fundamental wisdom about her situation continues to be quite poor. She talks about how she wants to get up and go home to get some of her stuff. I pointed out to her that she is so sick she can barely get out of bed and is connected to IVs and tube feeding. I pointed out to her that it seemed obvious that she was not in any physical shape to go home right now. This seemed to be irrelevant to her. No change to current medication. I don't think we need to start weaning back on any of the antipsychotic medication yet while she still needs to be able to cooperate with medical treatment.   Electronic Signatures: Audery Amellapacs, John T (MD)  (Signed 05-Jan-14 00:05)  Authored: Details   Last Updated: 05-Jan-14 00:05 by Audery Amellapacs, John T (MD)

## 2015-01-05 NOTE — Consult Note (Signed)
Psychiatry: Came to see patient for consult about evaluating her commitment status. I am not quite sure I understand the question that is being asked. The patient's chart shows commitment paperwork that was filed by the emergency room doctor on January 12. Typically that sort of paperwork should be good to guarantee holding the patient for 7 days from the date. If my understanding is correct that means that her paperwork is still valid up until January 19. After that time there would either have to be a new commitment petition filed to continue the hold or her current paperwork would expire. I spoke to the patient and found her to be more lucid and calm than I have seen her in the past. She is still, however, talking about how much she wants to go home right away. I understand that during this hospital stay she left AGAINST MEDICAL ADVICE and then came back in a day or less because of her pain. Patient still says that she has severe pain and understands that she needs more medical treatment in the hospital. She is not however immediately threatening to walk out. this point if she were to try to leave before the 19th she could be held by Patent examinerlaw enforcement. After the 19th if she is in the mental state she is right now I do not think she meets commitment criteria. He is not acutely dangerous to self and it is not clear that her poor judgment is related to mental illness. I am wondering if it might be better for someone to consider that this patient is longer-term incompetent and may need some legal declaration of her lack of capacity. Pending that however I don'[name] think they're filing another commitment would probably be reasonable under the current circumstances. Hopefully the patient can be Calm and coaxed into staying in the hospital long enough to get appropriate treatment. I see the notes suggest she may be considered for discharge with home health care which would probably be about the best circumstance if it is  safe for medically.  Electronic Signatures: Kim [name], Kim [name] (MD)  (Signed on 17-Jan-14 22:11)  Authored  Last Updated: 17-Jan-14 22:11 by Kim [name], Kim [name] (MD)

## 2015-01-05 NOTE — Op Note (Signed)
PATIENT NAME:  Kim AmabileFORBIS, Jasalyn D MR#:  [id number] DATE OF BIRTH:  Feb 10, 1966  DATE OF PROCEDURE:  08/10/2013  OPERATION PERFORMED:  1.  Distal transverse and descending colectomy with proximal proctectomy.  2.  Colostomy takedown with mid transverse coloproctostomy.  3.  Peristomal hernia repair.   SURGEON: Claude MangesWilliam F Allayah Raineri, M.D.   ANESTHESIA: General.   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   PROCEDURE IN DETAIL: The patient was placed in the modified lithotomy position and prepped and draped in the usual sterile fashion. Midline incision was made through the previous scar and carried down through the linea alba and scar tissue to the peritoneum. The omentum was present along the entire length of the wound and tedious dissection of the omentum off of the anterior abdominal wall (particularly the left side) allowed it to be freed up. On the right side similarly the omentum was taken down off the anterior abdominal wall but here the loop colostomy was encountered and was reduced by dissecting it out from the inside with the electrocautery. Once the entire prolapse was reduced, a small football-shaped or elliptical incision was made in the skin in a longitudinal orientation around the ostomy and the ostomy was drawn through the abdominal wall into the field. Soft, bowel clamps were applied to prevent any spillage. There were significant adhesions from the colon and the omentum to loops of small intestine and to itself and these were all taken down such that the omentum was completely freed off of the transverse colon. After this was done, attention was turned to the pelvis where the small intestine was freed up from the pelvis with sharp dissection and in the process the left ureter was identified and was spared. The left ovarian artery was ligated as it  was bleeding, but left ovarian vein was left intact. Within the pelvis, the patient's rectum was extremely diminutive and atrophic. Therefore, I dilated it up transanally to 29 mm utilizing the EEA sizers, but I did not feel that anything larger could be brought into it. The most proximal rectum could not be dilated and therefore, I elected to resect this. I did this with the contour stapling device and took the mesorectum down with the Harmonic scalpel. I then looked at the colon and the patient had a large cecum containing semisolid stool and she had relatively solid stool coming out of the mid transverse colostomy area and she had extremely long distal transverse colon and descending colon. This had not been used for a year and was atrophic and contained stool that was unprepped and therefore, I elected to resect it. I did so after I ensured that the mid transverse colon would reach the pelvis. It did so without tension. With the colectomy, I was able to maintain the middle colic artery and vein but took down the majority of the distal transverse and descending mesocolon. This was done with the Harmonic scalpel all the way to the left colic artery, which were ligated with 2-0 silk ligature. I then performed a Francis DowseJoel Baker-type anastomosis by inserting the 29 mm anvil through the colostomy and bring it out through the antimesenteric surface and stapling off the distal end of the mid transverse colon. The ascending colon came up to the liver and took a 180 degree turn down next to it as the transverse colon traveled along the right side of the patient's abdomen and  to the right of the small intestine. It then reached the rectum. I performed an EEA anastomosis transanally under no tension and following that occluded the proximal colon with a bowel clamp and placed warm normal saline in the pelvis and performed a rigid proctoscopy. The anastomosis was 10 cm proximal to the anal verge and there was no air leakage. I  then replaced the small intestine in its anatomic position to the left of the ascending and transverse colon and placed the omentum over top of this and irrigated the peritoneum with copious amounts of warm normal saline until the effluent was completely clear. I then closed the ostomy site where the parastomal hernia was present in two layers with the inner layer being a running 0 Prolene suture, and the outer layer being a running #1 PDS suture. I then closed the linea alba with a running #1 PDS suture, irrigated the subcutaneous tissue and closed the midline skin with a skin stapling device. The ostomy site was also closed with a skin stapling device leaving a 2 cm gap in the middle through which I placed a saline-soaked Telfa wick. Sterile dressings were applied. The patient tolerated the procedure well. There were no complications.    ____________________________ Claude Manges, MD wfm:dp D: 08/10/2013 14:02:34 ET T: 08/10/2013 15:02:19 ET JOB#: [id number]  cc: Claude Manges, MD, <Dictator> Claude Manges MD ELECTRONICALLY SIGNED 08/12/2013 8:45

## 2015-01-05 NOTE — Consult Note (Signed)
Details:    - Psychiatry: Came by to followup with this patient who has bipolar disorder and is currently being treated with surgery for complications to her colonoscopy. Today when I found her she was awake and pleasantly interactive. Made good eye contact. Psychomotor activity was calm. She tells me that she is feeling much better today and is not having as much pain. She apparently had some surgery today and but also now shows me the patient-controlled anesthesia device. She credits this with improving her pain control. She was not confused or agitated. No sign of psychotic thinking. We discussed her previous episode of leaving the hospital AGAINST MEDICAL ADVICE. She tells me now that she understands what a clear mistake that was and that she does not think she would do something like that again. She tells me that she understands that the treatment plan will help her to reach a condition of getting her pain really under control so that she can go home long-term.  Altogether today she was very reasonable. There was no sign of any acute dangerousness. As I said yesterday her commitment technically runs through tomorrow but at this point she is not meeting commitment criteria and I would not renew it. I will continue to followup with her as needed. No change to medication. Patient was asking about her Ativan. I checked the order sheet and she is already receiving it as she should be.   Electronic Signatures: Audery Amellapacs, John T (MD)  (Signed 18-Jan-14 22:33)  Authored: Details   Last Updated: 18-Jan-14 22:33 by Audery Amellapacs, John T (MD)

## 2015-01-05 NOTE — Op Note (Signed)
PATIENT NAME:  Kim [name], Kim [name] MR#:  [id number] DATE OF BIRTH:  28-Nov-1965  DATE OF PROCEDURE:  09/14/2012  PREOPERATIVE DIAGNOSIS: Incarcerated parastomal hernia with ischemic colon and perforation.   POSTOPERATIVE DIAGNOSIS: Incarcerated parastomal hernia with ischemic colon and perforation.   PROCEDURE PERFORMED: Wound VAC change, pulsed lavage irrigation, partial wound closure.   SURGEON: Natale LayMark Victorya Hillman MD   ASSISTANT: None.   ANESTHESIA: General.   DESCRIPTION OF PROCEDURE: With the patient in the supine position, general endotracheal anesthesia was induced. The existing wound VAC was carefully removed. This included the ostomy appliance. Care was taken to avoid dislodging the ostomy bridge. The wound and abdomen were then sterilely prepped and draped with Betadine solution.   Pulsed lavage irrigation with 1 liter of normal saline was performed of the wound. On the right side above the umbilical area was an area of some minor tunneling. The fascia appeared to be intact. This extended several centimeters on the right side. Superiorly and inferiorly, there was healthy granulation tissue. Hemostasis was obtained with point cautery.   Interrupted simple and vertical mattress 3-0 nylon sutures were placed at the inferior aspect of the wound along several centimeters and then along the superior aspect of the midline wound several centimeters. The old ostomy site appeared to be clean.   Black granular foam was then placed into the wounds. Ioban draping was placed over the entire area including the ostomy site. A bridge was fashioned over the two sites of mesh. Tract pad was placed. Negative pressure was applied. The ostomy was then exposed by removing the impervious drape and then applying an ostomy appliance without difficulty. The patient was then taken to the recovery room, extubated in stable condition.   ____________________________ Redge GainerMark A. Egbert GaribaldiBird, MD mab:cb [name]: 09/15/2012 08:42:40  ET T: 09/15/2012 09:26:15 ET JOB#: [id number]  cc: Loraine LericheMark A. Egbert GaribaldiBird, MD, <Dictator> Raynald KempMARK A Todrick Siedschlag MD ELECTRONICALLY SIGNED 09/20/2012 7:42

## 2015-01-05 NOTE — Discharge Summary (Signed)
PATIENT NAME:  [name], Kim D MR#:  [id number] DATE OF BIRTH:  August 31, 1966  DATE OF ADMISSION:  08/10/2013 DATE OF DISCHARGE:  08/12/2013  AGAINST MEDICAL ADVICE.   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   OTHER DIAGNOSES:  1.  Bipolar disorder.  2.  History of fibromyalgia.  3.  History of peptic ulcer disease.  4.  History of gastroesophageal reflux disease.  5.  Anxiety disorder.  6.  Chronic obstructive pulmonary disease.  7.  Chronic bronchitis.  8.  History of alcohol and tobacco abuse.  9.  History of depression with history of intentional overdose.  10.  History of migraine headaches.  11.  Status post left wrist surgery from attempt to commit suicide.  12.  Status post total hysterectomy.  13.  Status post appendectomy.  14.  Status post Hartmann procedure.  15.  Status post parastomal hernia with dead bowel requiring resection and relocation of ostomy, 09/07/2012.  16.  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   PRINCIPAL PROCEDURES PERFORMED DURING THIS ADMISSION:  On 08/10/2013: 1.  Distal transverse and descending colectomy with proximal proctectomy.  2.  Colostomy takedown with mid transverse coloproctostomy.  3.  Peristomal hernia repair.   HOSPITAL COURSE: The patient was admitted to the hospital for a preoperative bowel prep but a large section of her colon (the portion that was resected) was distal to the loop ostomy and ended in a blind staple line precluding bowel prep.   Postoperatively, the patient did well and complained of a headache and a little bit of vomiting on postoperative day 1, and her abdominal pain had improved. She had had a few blood clots per rectum which was not unexpected, and her abdominal examination looked good, and, at her request, her Dilaudid PCA was switched to p.r.n. IV Demerol. Her Foley catheter was  removed.   On postoperative day 2, she was afebrile and had not passed any flatus and had not had any bowel movements, just a few more blood clots. She was not nauseous. Her abdomen was mildly distended. Both of her incisions were clean, and I left the wick in her old ostomy site. She still had a headache, and I placed her on Entereg and p.o. Norco for pain and allowed her to have a shower.   Despite my better judgment, I advanced her diet at her request, but later there was no consoling her, and I was called by the nurse, saying that she was leaving AGAINST MEDICAL ADVICE on the evening of November 28 (postop day 2). I asked the nurse to hold her until I could get up there, but within 15 minutes, despite having the p.m. nurse supervisor involved, the patient insisted on leaving, and did so.    ____________________________ Claude MangesWilliam F. Shelina Luo, MD wfm:np D: 08/12/2013 17:47:46 ET T: 08/12/2013 22:57:37 ET JOB#: [id number]  cc: Claude MangesWilliam F. Kazzandra Desaulniers, MD, <Dictator> Claude MangesWILLIAM F Franciscojavier Wronski MD ELECTRONICALLY SIGNED 08/13/2013 8:52

## 2015-01-05 NOTE — H&P (Signed)
History of Present Illness S/P relocation of ostomy for parastomal hernia with dead bowel 10-01-2012. Has had multiple admissions to [facility] since. Ultimately had her midline wound VAC removed with a delayed secondary closure. Now represents with foul-smelling dark liquid pus from her old ostomy site.    Past History See previous recent H&Ps   Past Med/Surgical Hx:  gastroparesis:   Bipolar Disorder:   Fibromyalgia:   Hepatitis:   Ulcers, Gastric:   GERD - Esophageal Reflux:   Anxiety:   copd:   chronic bronchitis:   ETOH ABUSE: ADMISSION ETOH 0.179  TOBACCO ABUSE: 4 PACKS A DAY  depression along with intentional overdose:   migraines: bc goody powders  Colostomy:   Colon Resection with colostomy:   Hysterectomy - Total:   LEFT WRIST SUTURED FROM ATTEMPT TO CUT WRIST:   TUBAL LIGATION:   APPENDECTOMY:   ALLERGIES:  Sulfa drugs: Anaphylaxis  PCN: Hives, Other, Swelling  HOME MEDICATIONS: Medication Instructions Status  Cymbalta 60 mg delayed release capsule 2 cap(s) orally once a day at 1 pm Active  Flonase 50 mcg/inh nasal spray 1 spray(s) nasal once a day, As Needed for congestion Active  Requip 0.5 mg oral tablet 1 tab(s) orally once a day in the afternoon at 1 pm and 2 tablets at bedtime Active  topiramate 50 mg oral tablet 1 tab(s) orally 2 times a day Active  quetiapine 150 mg oral tablet, extended release 1 tab(s) orally once a day at 8 pm Active  acetaminophen-oxycodone 325 mg-5 mg oral tablet 2 tab(s) orally every 6 hours Active  Motrin IB 200 mg oral tablet 2 tab(s) orally every 8 hours Active   Family and Social History:   Family History Non-Contributory    Social History positive  tobacco, positive ETOH    + Tobacco Current (within 1 year)   Review of Systems:   ROS Pt not able to provide ROS   Physical Exam:   GEN well developed, well nourished, disheveled    HEENT pink conjunctivae, PERRL, hearing intact to voice, moist oral mucosa    NECK  trachea midline    RESP normal resp effort  clear BS    CARD regular rate  no JVD    ABD denies tenderness  no hernia  soft  foul-smelling dark brown liquid draining from left (old) ostomy site with surrounding cellulitis and one streak going out to left flank    LYMPH negative neck    EXTR negative cyanosis/clubbing, negative edema    SKIN cellulitis as above    NEURO cranial nerves intact, negative rigidity, follows commands, motor/sensory function intact    PSYCH alert, poor insight   Lab Results: Hepatic:  12-Jan-14 15:43    Bilirubin, Total 0.3   Alkaline Phosphatase  168   SGPT (ALT) 13   SGOT (AST) 15   Total Protein, Serum  9.1   Albumin, Serum  2.6  Routine Micro:  12-Jan-14 16:10    Micro Text Report BLOOD CULTURE   COMMENT                   NO GROWTH IN 8-12 HOURS   ANTIBIOTIC                        Micro Text Report BLOOD CULTURE   COMMENT                   NO GROWTH IN 8-12 HOURS  ANTIBIOTIC                        Culture Comment NO GROWTH IN 8-12 HOURS  Result(s) reported on 27 Sep 2012 at 07:54AM.   Culture Comment NO GROWTH IN 8-12 HOURS  Result(s) reported on 27 Sep 2012 at 07:55AM.  Routine Chem:  12-Jan-14 15:43    Glucose, Serum  102   BUN 11   Creatinine (comp) 0.97   Sodium, Serum 138   Potassium, Serum  2.8   Chloride, Serum 104   CO2, Serum 26   Calcium (Total), Serum 8.6   Anion Gap 8   Osmolality (calc) 275   eGFR (African American) >60   eGFR (Non-African American) >60 (eGFR values <60mL/min/1.73 m2 may be an indication of chronic kidney disease (CKD). Calculated eGFR is useful in patients with stable renal function. The eGFR calculation will not be reliable in acutely ill patients when serum creatinine is changing rapidly. It is not useful in  patients on dialysis. The eGFR calculation may not be applicable to patients at the low and high extremes of body sizes, pregnant women, and vegetarians.)  13-Jan-14 03:43     Magnesium, Serum - (1.8-2.4 THERAPEUTIC RANGE: 4-7 mg/dL TOXIC: > 10 mg/dL  -----------------------)   Result Comment MAGNESIUM - CANCEL VERIFIED IN ERROR TEST  - REORDERED-DAC  Result(s) reported on 27 Sep 2012 at 07:20AM.    07:34    Glucose, Serum 86   BUN 9   Creatinine (comp) 0.69   Sodium, Serum 140   Potassium, Serum  3.2   Chloride, Serum 107   CO2, Serum  19   Calcium (Total), Serum  8.2   Anion Gap 14   Osmolality (calc) 277   eGFR (African American) >60   eGFR (Non-African American) >60 (eGFR values <60mL/min/1.73 m2 may be an indication of chronic kidney disease (CKD). Calculated eGFR is useful in patients with stable renal function. The eGFR calculation will not be reliable in acutely ill patients when serum creatinine is changing rapidly. It is not useful in  patients on dialysis. The eGFR calculation may not be applicable to patients at the low and high extremes of body sizes, pregnant women, and vegetarians.)   Magnesium, Serum 1.9 (1.8-2.4 THERAPEUTIC RANGE: 4-7 mg/dL TOXIC: > 10 mg/dL  -----------------------)  Routine UA:  12-Jan-14 17:27    Color (UA) Yellow   Clarity (UA) Hazy   Glucose (UA) Negative   Bilirubin (UA) Negative   Ketones (UA) Negative   Specific Gravity (UA) 1.011   Blood (UA) Negative   pH (UA) 5.0   Protein (UA) Negative   Nitrite (UA) Negative   Leukocyte Esterase (UA) Negative (Result(s) reported on 26 Sep 2012 at 05:43PM.)   RBC (UA) <1 /HPF   WBC (UA) 4 /HPF   Bacteria (UA) 1+   Epithelial Cells (UA) 2 /HPF (Result(s) reported on 26 Sep 2012 at 05:43PM.)  Routine Hem:  12-Jan-14 15:43    WBC (CBC)  14.0   RBC (CBC) 3.92   Hemoglobin (CBC)  11.7   Hematocrit (CBC) 35.9   Platelet Count (CBC)  625   MCV 92   MCH 29.9   MCHC 32.6   RDW  15.0   Neutrophil % 82.0   Lymphocyte % 9.5   Monocyte % 7.4   Eosinophil % 0.8   Basophil % 0.3   Neutrophil #  11.5   Lymphocyte # 1.3   Monocyte #  1.0     Eosinophil # 0.1    Basophil # 0.0 (Result(s) reported on 26 Sep 2012 at 03:59PM.)  13-Jan-14 10:58    WBC (CBC) 7.2   RBC (CBC)  3.29   Hemoglobin (CBC)  9.8   Hematocrit (CBC)  30.0   Platelet Count (CBC)  540   MCV 91   MCH 29.7   MCHC 32.6   RDW  14.9   Neutrophil % 61.3   Lymphocyte % 23.8   Monocyte % 13.2   Eosinophil % 1.2   Basophil % 0.5   Neutrophil # 4.4   Lymphocyte # 1.7   Monocyte # 0.9   Eosinophil # 0.1   Basophil # 0.0 (Result(s) reported on 27 Sep 2012 at 11:20AM.)     Assessment/Admission Diagnosis Possible necrotizing soft tissue infection    Plan Admit, debride, broad-spectrum ABx   Electronic Signatures: Marterre, William F (MD)  (Signed 13-Jan-14 16:12)  Authored: CHIEF COMPLAINT and HISTORY, PAST MEDICAL/SURGIAL HISTORY, ALLERGIES, HOME MEDICATIONS, FAMILY AND SOCIAL HISTORY, REVIEW OF SYSTEMS, PHYSICAL EXAM, LABS, ASSESSMENT AND PLAN   Last Updated: 13-Jan-14 16:12 by Marterre, William F (MD) 

## 2015-01-05 NOTE — Op Note (Signed)
PATIENT NAME:  Kim [name], Kim D MR#:  [id number] DATE OF BIRTH:  19-Sep-1965  DATE OF PROCEDURE:  09/17/2012  PROCEDURE PERFORMED: Removal of wound vacuum-assisted closure and delayed primary closure measuring 25 cm.   SURGEON: Natale LayMark Zamiya Dillard, MD  ASSISTANT: None.   ANESTHESIA: General.  FINDINGS: Granulation tissue. No evidence of active infection. The previously placed sutures from Tuesday demonstrated good incorporation with no active signs of infection.   SPECIMENS: None.   ESTIMATED BLOOD LOSS: None.   DESCRIPTION OF PROCEDURE: With informed consent obtained from the patient's sister by telephone, the patient was brought to the operating room and positioned supine, and general oroendotracheal anesthesia was induced. The existing wound VAC and ostomy appliance were removed. The ostomy bridge was removed as she is postoperative day #10.   The wound was irrigated and found to be clean. The old ostomy site was reapproximated utilizing interrupted vertical mattress sutures of 3-0 nylon and a few skin staples. It closed easily. The remaining middle portion of the midline wound was closed in a similar fashion. In the area of tunneling seen around the umbilicus, a piece of iodoform gauze was wicked into the wound and left open for approximately 1 cm. A sterile occlusive dressing was placed following reapplication of the ostomy appliance. The patient was then subsequently extubated and transferred to the recovery room in stable and satisfactory condition.   ____________________________ Redge GainerMark A. Egbert GaribaldiBird, MD mab:OSi D: 09/17/2012 12:37:33 ET T: 09/17/2012 12:44:18 ET JOB#: [id number]  cc: Loraine LericheMark A. Egbert GaribaldiBird, MD, <Dictator> Raynald KempMARK A Simaya Lumadue MD ELECTRONICALLY SIGNED 09/20/2012 7:43

## 2015-01-05 NOTE — Consult Note (Signed)
Details:    - Psychiatry: Came back to live followup with this patient with bipolar disorder who also had recently been delirious. Today she is looking better than the last time I saw her. She was not aggressive or behaving in a dangerous manner. She was alert and oriented to her situation and to the date. Her concentration however wanders very easily and she got off topic into some odd conversation frequently. I was able to redirect her however appeared she does not appear to be oversedated. It seems like she is probably a little more cooperative with treatment. I see that she is still getting the standing regular doses of Haldol and Ativan and I think that it would probably be best to continue those for the time being for the sake of medical safety and stabilization rather than starting to taper them. I will continue to followup with her.   Electronic Signatures: Audery Amellapacs, John T (MD)  (Signed 02-Jan-14 18:10)  Authored: Details   Last Updated: 02-Jan-14 18:10 by Audery Amellapacs, John T (MD)

## 2015-01-05 NOTE — Op Note (Signed)
PATIENT NAME:  [name], Kim D MR#:  [id number] DATE OF BIRTH:  August 22, 1966  DATE OF PROCEDURE:  09/29/2012  PREOPERATIVE DIAGNOSIS: Intraabdominal abscess, two wound infections.   POSTOPERATIVE DIAGNOSIS: Intraabdominal abscess, two wound infections.   PROCEDURES PERFORMED: 1. Drainage of intraabdominal abscess.  2. Placement of wound VAC.   SURGEON: Claude MangesWilliam F. Judithann Villamar, M.D.   ANESTHESIA: General.  FINDINGS: No additional necrotic fascia. Approximately 20 mL of creamy pus expressed from beneath the fascia of the large (previous ostomy site) wound. Subcutaneous abscess in the midline wound extending most of the length beside the ostomy.   PROCEDURE IN DETAIL: The patient was placed supine on the operating room table and her ostomy appliance which was getting ready to come off was removed and she was then prepped and draped in the usual sterile fashion. A new ostomy appliance on her loop ileostomy was placed and exploration of the midline wound showed that there was pus emanating from the subcutaneous space along most of the length of the midline wound. The skin here was left intact since it was next to the ostomy and once all of the pus was drained a black wound VAC sponge was inserted into the subcutaneous space along the length of the incision beneath the incision where the skin had healed. An additional portion of the sponge was placed into the deep portion of the small skin opening here and the remaining portion of the black sponge was used to drape across the skin over to the other larger wound. The large wound was draining slightly off-white or slightly brown pus that was of different character than the pus from the midline. This was coming through the fascial hole, which I enlarged with a Kelly clamp, into the intraabdominal abscess cavity. All of the intraabdominal abscess cavity was adequately drained as determined by blunt probing with a baby Yankauer suction tip and this abscess cavity was  then irrigated and a white sponge was placed down through the abdominal wall but not into the abscess cavity so as not to have it abut any loops of intestine. A black sponge was then cut to the dimensions of the remainder of the old ostomy site wound and the previously located black sponge skin bridge from the midline wound was folded over top of it and the remainder of the wound VAC drape was applied to include the gastrostomy tube site which was cleaned up and dressed with a Telfa dressing between the skin and the bolster just prior to the completion of the wound VAC placement. Suction was applied to the wound VAC and there was only a tiny leak and this then completed the procedure. The patient tolerated the procedure well. There were no complications. ____________________________ Claude MangesWilliam F. Istvan Behar, MD wfm:sb D: 09/29/2012 08:33:13 ET T: 09/29/2012 10:29:15 ET JOB#: [id number]  cc: Claude MangesWilliam F. Polly Barner, MD, <Dictator> Claude MangesWILLIAM F Zavien Clubb MD ELECTRONICALLY SIGNED 10/01/2012 11:26

## 2015-01-05 NOTE — Consult Note (Signed)
Brief Consult Note: Diagnosis: Schizoaffective disorder bipolar type in remission.   Patient was seen by consultant.   Consult note dictated.   Recommend further assessment or treatment.   Orders entered.   Comments: [name] has a long h/o mental illness. She is in care of Dr. Cherylann RatelLateef and [facility] ACT team. She is stable on medications.She agreed to stay in the hospital and undergo necessary treatment.  She underwent one surgery already, next step tomorrow. She is pleasant, polite and optimistic about treatment. No safety issues. No psychosis. tolerates medications well.  PLAN: 1. Please continue all her outpatient psychotropic medications.   2. I will follow up.  Electronic Signatures: Kristine LineaPucilowska, Tonatiuh Mallon (MD)  (Signed 17-Jan-14 21:12)  Authored: Brief Consult Note   Last Updated: 17-Jan-14 21:12 by Kristine LineaPucilowska, Gary Bultman (MD)

## 2015-01-05 NOTE — Discharge Summary (Signed)
PATIENT NAME:  Kim [name], Kim [name] MR#:  [id number] DATE OF BIRTH:  [birth year]  DATE OF ADMISSION:  09/27/2012 DATE OF DISCHARGE:  10/05/2012  PRINCIPLE DIAGNOSIS: Enterocutaneous fistula.   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   PRINCIPLE PROCEDURE PERFORMED DURING THIS ADMISSION: 09/27/2012, debridement of necrotizing cutaneous, subcutaneous and fascial infection of the left mid abdominal wall with drainage of left flank abscess. 09/29/2012, drainage of left flank intra-abdominal abscess and placement of wound VAC. 10/02/2012, debridement of midline fascia and wound VAC change.   HOSPITAL COURSE: The patient underwent the above-mentioned operations for the above-mentioned problems and on 01/19 it was apparent that she was leaking stool from her left abdominal wall (old ostomy site) wound. There was excellent granulation tissue here and no further necrotic debris, so the wound VAC was removed and an ostomy appliance was applied to this area and the small midline wound was converted to dressing changes. Her nutrition was maintained by TPN and the involuntary commitment that she had upon admission had expired and she insisted on being discharged and therefore was discharged on 10/05/2012 and given an appointment to follow up with Dr. Michela PitcherEly in 7 to 10 days. At the time of discharge, she was no longer requiring antibiotic therapy and was at her baseline from a mental and psychiatric  standpoint and she was quite active.     ____________________________ Claude MangesWilliam F. Zehra Rucci, MD wfm:aw [name]: 10/13/2012 20:53:07 ET T: 10/14/2012 08:23:18 ET JOB#: [id number]  cc: Claude MangesWilliam F. Randie Tallarico, MD, <Dictator> Claude MangesWILLIAM F Adrina Armijo MD ELECTRONICALLY SIGNED 10/14/2012 19:10

## 2015-01-05 NOTE — Consult Note (Signed)
Brief Consult Note: Diagnosis: Schizoaffective disorder bipolar type in remission.   Patient was seen by consultant.   Consult note dictated.   Recommend further assessment or treatment.   Orders entered.   Comments: [name] has a long h/o mental illness. She is in care of Dr. Cherylann [name] and [facility] team. She is stable on medications. There are no safety issues. She is not psychotic. She agreed to stay in the hospital and undergo necessary treatment.  PLAN: 1. Please continue all her outpatient psychotropic medications.   2. I will follow up.  Electronic Signatures: Kim [name], Kim [name] (MD)  (Signed 13-Jan-14 21:07)  Authored: Brief Consult Note   Last Updated: 13-Jan-14 21:07 by Kim [name], Kim [name] (MD)

## 2015-01-05 NOTE — Op Note (Signed)
PATIENT NAME:  Kim [name], Kim [name] MR#:  [id number] DATE OF BIRTH:  09/11/1966  DATE OF PROCEDURE:  09/27/2012  PREOPERATIVE DIAGNOSIS: Necrotizing soft tissue infection.   POSTOPERATIVE DIAGNOSES: Necrotizing soft tissue infection, left flank intra-abdominal or intra-abdominal wall abscess.   OPERATION PERFORMED:  1. Debridement of necrotizing cutaneous, subcutaneous, and fascial infection of left mid abdominal wall.  2. Drainage of left flank intra-abdominal or intra-abdominal wall abscess.   SURGEON: Claude MangesWilliam F. Lynard Postlewait, MD.   ANESTHESIA: General.   PROCEDURE IN DETAIL: The patient was placed supine on the operating room table and prepped and draped in the usual sterile fashion. All remaining skin suture and skin staple material was removed, including from the delayed primary closure in the midline wound and the G-tube and the old ostomy site. The old ostomy site was opened up through the length of its original scar and was seen to contain soupy, very dark brown, very foul-smelling pus. The inner lining of the entire wound was black and necrotic. The debridement of all necrotic tissue here was performed with a scalpel and Russian forceps, and a few additional fascial sutures were removed and there was a #2 pencil-sized opening in the fascia in the superior aspect of the wound that went quite deep and possibly into the intra-abdominal cavity (which likely was obliterated due to scar tissue). It is also possible that this tracked into the intra-abdominal wall and not below the peritoneum or tracked into the preperitoneal space. When I pushed on the left flank, creamy, white, nonfoul-smelling pus emanated from this pencil-sized hole and I continued to express this pus until no further pus could be expressed. I then achieved hemostasis with the electrocautery and irrigated the wound with hydrogen peroxide and packed the wound with hydrogen peroxide-laden Kerlix sponge. In the midline incision, there was one  small area of pus which I opened up slightly, and this pus was also creamy white in color, similar to the color of tube feedings and was not foul smelling. There was no evidence of necrosis or blackened area within this wound, and I similarly packed that wound with the hydrogen peroxide-soaked Kerlix sponge. I then cleaned up the gastrostomy tube site and fitted the bolster to the skin over top of a doubled Telfa bolster to protect the skin where the skin had undergone some pressure necrosis. I secured the bolster to the [facility] with a #2 nylon suture and irrigated the Moss gastrostomy tube with sterile saline and then suctioned this clear, and the only thing that was present in the area was light green or bilious liquid contents within the stomach. There was nothing that appeared similar to the white creamy pus that I had seen in the other 2 locations. Ultimately, dry dressings were placed on top of both of the abscesses, completing the procedure. The patient tolerated the procedure well, and there were no complications.    ____________________________ Claude MangesWilliam F. Jalee Saine, MD wfm:gb [name]: 09/27/2012 18:08:28 ET T: 09/28/2012 00:37:44 ET JOB#: [id number]  cc: Claude MangesWilliam F. Tamme Mozingo, MD, <Dictator> Claude MangesWILLIAM F Adalyne Lovick MD ELECTRONICALLY SIGNED 09/29/2012 9:05

## 2015-01-05 NOTE — Discharge Summary (Signed)
PATIENT NAME:  Kim [name], Kim [name] MR#:  [id number] DATE OF BIRTH:  12-Mar-1966  DATE OF ADMISSION:  09/08/2012 DATE OF DISCHARGE:  09/21/2012  HISTORY: The patient is a 49 year old woman admitted through the Emergency Room with an acute abdominal problem. She had had a perforated colon 2 years before treated by Dr. Egbert Garibaldi with a Hartman's procedure and colostomy blind distal end. She had done quite well, but had some significant psychological issues and was unable to care for herself at the time. Dr. Egbert Garibaldi declined to close her ostomy at the time and she went [facility] for further evaluation for potential colostomy closure. For similar reasons, she did not have the ostomy closed at [facility]. Two days prior to admission, she presented to the Emergency Room by her report in [facility] and a CT scan was performed demonstrating a parastomal hernia. She noted increased abdominal discomfort several days before and had some swelling along the site of her stoma. They recommended she return to [facility] for further evaluation since we had operated on her originally. She came back 2 days later on the 24th now with significant abdominal tenderness, erythema, elevated white blood cell count and CT evidence of perforation and intra-abdominal infection. She was taken urgently to surgery that evening where she underwent a abdominal exploration. She did have a loop of bowel stuck up beside her ostomy, which was necrotic. The ostomy was taken down and the bowel resected. A loop colostomy was performed on the right side with diversion of the fecal stream from the site of her surgery. The ostomy defect was closed and a wound VAC placed. She did quite well over the next several days. She was able to be extubated almost immediately. Psychiatry helped with her management postoperatively. She was taken to surgery back on the 27th and wound VAC change was accomplished. She did well with that procedure. She had a G-tube placed at  that time of original surgery and begun on tube feedings. She continued to have marked psychological issues and psychiatry service was very helpful in following her during this admission. On the 31st, she of taken back to the operating room where she underwent wound VAC change again. This time, she had a pulse lavage and partial wound closure. She was taken back to the operating room on January 03, where she underwent delayed primary closure of the remaining wound. She has had no active evidence of infection at the time. She continued to improve over the next several days. She insists on being discharged home on the 7th, although she had not completed her course of antibiotic therapy or her nutritional rehabilitation post surgery. She was felt to be competent by the psychiatry service and is discharged home on 09/21/2012.   DISCHARGE MEDICATIONS: Included Flonase spray once a day, Cymbalta 60 mg once a day, Zofran 8 mg a centigrade tablet once a day, Requip 1 mg 3 times a day, Norco 5/325 every 6 hours p.r.n., Piramate 50 mg p.o. q. 12 hours, Quetiapine 300 mg once a day and 50 mg 3 times a day. She is also on pantoprazole 40 mg twice a day, Synthroid 0.025 mg once a day. She was also told to stop taking her DuoNeb, ibuprofen, sumatripan succinate, Cipro, Flagyl, insulin and Haldol.   FINAL DISCHARGE DIAGNOSES: Parastomal hernia with intestinal gangrene.   SURGERY:  1.  Exploratory laparotomy, colon resection and loop colostomy.  2.  Wound VAC change.  3.  Wound VAC change. 4.  Delayed  primary closure.  ____________________________ Carmie Endalph L. Ely III, MD rle:aw D: 10/04/2012 20:00:12 ET T: 10/05/2012 08:24:40 ET JOB#: [id number]  cc: Quentin Orealph L. Ely III, MD, <Dictator> Quentin OreALPH L ELY MD ELECTRONICALLY SIGNED 10/05/2012 22:50

## 2015-01-05 NOTE — Op Note (Signed)
PATIENT NAME:  Kim AmabileFORBIS, [name] MR#:  [id number] DATE OF BIRTH:  [birth year]  DATE OF PROCEDURE:  10/02/2012  OPERATION PERFORMED: 1. Debridement of midline fascia.  2. Wound VAC change.   PREOPERATIVE DIAGNOSIS: Left and midline abdominal wounds, intra-abdominal abscess.  POSTOPERATIVE DIAGNOSIS: Left and midline abdominal wounds, intra-abdominal abscess, enterocutaneous fistula.   SURGEON: Claude MangesWilliam F. Shaun Zuccaro, M.D.   ANESTHESIA: General.   PROCEDURE IN DETAIL: The patient was placed supine on the Operating Room table and prepped and draped in the usual sterile fashion after her wound VAC was removed. There was stool present in the wound VAC container and stool emanating from the left abdominal wound. This was suctioned clear and the surfaces of that wound contained healthy granulation tissue with no evidence of necrosis. There was a very small amount of stool that drain through the portion of that wound and I did see one little bubble of gas come through it as well. The midline wound was clean with good granulation tissue around all of the surfaces including the subcutaneous tract that I had placed a sponge in 3 days earlier. The fascia, however, was necrotic, but not black. I debrided the necrotic fascia and removed some remaining suture material down to a level below the fascia but there was no evidence of pus or stool draining through this wound. I fashioned the white wound VAC sponge into the midline wound and across the skin bridge (underneath which I put a strip of Telfa) so that it would connect with the main wound on the left side. I placed a white sponge into the deep portion or through the abdominal wall of the left wound but took care not to place the sponge too close to the intestine. I did not see an intestinal opening or any mucosa. I then fashioned a black sponge in the remaining portion of the left wound and completed placing the two layers of the plastic sheet material from the wound  VAC over the abdominal wall and placed a new Telfa sponge under the     Moss gastrostomy tube bolster. I connected the wound VAC suction and there was no leak and a good seal.   ____________________________ Claude MangesWilliam F. Kirbie Stodghill, MD wfm:jm D: 10/02/2012 08:51:17 ET T: 10/02/2012 12:44:47 ET JOB#: [id number]  cc: Claude MangesWilliam F. Clementine Soulliere, MD, <Dictator> Claude MangesWILLIAM F Johnita Palleschi MD ELECTRONICALLY SIGNED 10/03/2012 9:03

## 2015-01-06 NOTE — Consult Note (Signed)
PATIENT NAME:  Kim AmabileFORBIS, [name] MR#:  [id number] DATE OF BIRTH:  09/16/1965  DATE OF CONSULTATION:  11/27/2013  REFERRING PHYSICIAN:   CONSULTING PHYSICIAN:  Vinicio Lynk K. Guss Bundehalla, MD  [facility]  AGE: 49 years. SEX: Female. RACE: White.  SUBJECTIVE: Staff reported that the patient has been agitated, very uncooperative and had behavior that was and very abnocious.. Emergency Room physician refused to send her home at this time without getting further information about her and had been more stable.   OBJECTIVE: The patient was seen sitting in the hallway of BHU. She is alert and oriented. Very agitated. Upset and irritable about her not being discharged home. She reported "all I did was get intoxicated with alcohol and finding out why am I not being sent home".  However, staff reported that she is very obnoxious and gets agitated and very manipulative and behavior is hard to be treated. Insight and judgment guarded at this time.   IMPRESSION: Schizoaffective disorder, bipolar. Alcohol abuse after being sober since 2008, got intoxicated  with behavior that was agitated in the ER.  PLAN: [facility] as recommended by the ER physician and tomorrow that is 11/28/2013, Monday morning, Mr. Casandra DoffingKen Smith is to call the psychotherapeutic services and ACT team and find out more details and the appropriate discharge planning can be made at that time.   ____________________________ Jannet MantisSurya K. Guss Bundehalla, MD skc:aw D: 11/27/2013 18:25:00 ET T: 11/28/2013 06:29:59 ET JOB#: [id number]  cc: Monika SalkSurya K. Guss Bundehalla, MD, <Dictator> Beau FannySURYA K Gregoire Bennis MD ELECTRONICALLY SIGNED 11/29/2013 20:19

## 2015-01-06 NOTE — Consult Note (Signed)
Brief Consult Note: Diagnosis: abd pain.   Patient was seen by consultant.   Recommend further assessment or treatment.   Comments: No surgical needs, some mild pain afeter emesis, no sign of obstruction, exam shows no hernia, nontender F/U in office if needed.  Electronic Signatures: Lattie Hawooper, Ralphine Hinks E (MD)  (Signed 16-Mar-15 13:58)  Authored: Brief Consult Note   Last Updated: 16-Mar-15 13:58 by Lattie Hawooper, Lache Dagher E (MD)

## 2015-01-06 NOTE — Consult Note (Signed)
PATIENT NAME:  Kim AmabileFORBIS, Samanthajo D MR#:  [id number] DATE OF BIRTH:  1966/01/21  DATE OF CONSULTATION:  11/26/2013  CONSULTING PHYSICIAN:  Alixis Harmon K. Guss Bundehalla, MD  [facility] [place]   AGE: 49 years.  SEX: Female.  RACE: White.  SUBJECTIVE: The patient was seen in consultation in [facility]. The patient is a 49 year old white female, not employed and is on disability for mental illness. The patient has long history of diagnosis of schizoaffective disorder, bipolar. The patient is divorced, and currently she lives with her ex-husband and calls him her best friend. The patient report that she has been sober from alcohol since 2008 and had a drink of alcohol last night. She is being followed by ACT team by Dr. Cherylann RatelLateef at psychotherapeutic services. The supervisor at the ACT team thought that she was going to shoot herself and thought she was hallucinating, and [facility].   CHIEF COMPLAINT: "I am ready to go home. There is no need for me to be here".   HISTORY OF PRESENT ILLNESS:  As stated above, the patient was drinking alcohol last night after being sober since 2008. The patient reported that she is compliant with medications and being followed by ACT team on a regular basis.   PAST PSYCHIATRIC HISTORY: Has several inpatient on psychiatry in the past, currently being followed by ACT team and has enough medications, and just got her medications refilled.  She is on the following medications:  1. Lorazepam 1 mg p.o. once a day at bedtime. 2. Ropinirole 1 mg p.o. once a day in the morning and 2 tablets at bedtime. 3. Gabapentin 300 mg 1-2 tablets once a day. 4. Pantoprazole 40 mg released tablets once a day. 5. MiraLAX 1 dose orally once a day. 6. Combivent 100 mcg /20 mcg inhalation aerosol.   7.REmeron 15 mg once a day. 8. BuSpar 15 mg twice a day. 9. Cymbalta 60 mg twice a day. 10. Ondansetron 8 mg q. 4 hours p.r.n. as needed  for vomiting. 11. Hydrocodone p.r.n. as needed for pain. 12. Vitamin B12. 13. Vitamin D3. 14. Folic acid. 15. Fluticasone 15 mcg 1 puff inhaled twice a day. 16. Advair discus  subcutaneous once a day. The patient reports she just got a refill on all of her medications and is eager to go home.  OBJECTIVE:  Dressed in hospital clothes, alert and oriented, communicative, alert, cooperative.  No agitation.  Affect is neural and mood she describes being stable,  Denies feeling depressed.  Denies being hopeless or helpless.  Denies auditory or visual hallucinations. No psychosis. Denies hearing voices, seeing things.  Denies any paranoid or suspicious ideas.  . Cognition is intact.  General overall examination is fair.  Insight and judgement safe to adequate.  IMPRESSION:   1. Schizoaffective disorder, bipolar, currently stable on current medications. 2. Alcohol abuse after being sober since 2008, and probably was intoxicated yesterday when she was brought to the hospital.  PLAN:  DC IVC.  Discharge the patient to go back home to live with her best friend and ex-husband, and will get followup appointments with the ACT team with Dr. Cherylann RatelLateef at psychotherapeutic services, and has enough medications as she just gets them refilled.  ____________________________ Jannet MantisSurya K. Guss Bundehalla, MD skc:sg D: 11/26/2013 20:41:37 ET T: 11/27/2013 10:58:58 ET JOB#: [id number]  cc: Monika SalkSurya K. Guss Bundehalla, MD, <Dictator> Beau FannySURYA K Burdette Forehand MD ELECTRONICALLY SIGNED 11/27/2013 19:25

## 2015-01-07 NOTE — Op Note (Signed)
PATIENT NAME:  Kim AmabileFORBIS, Catrina D MR#:  [id number] DATE OF BIRTH:  09-18-1965  DATE OF PROCEDURE:  02/06/2012  PREOPERATIVE DIAGNOSIS: Right carpal tunnel syndrome.   POSTOPERATIVE DIAGNOSIS: Right carpal tunnel syndrome.   PROCEDURE: Right carpal tunnel release.   SURGEON: Myra Rudehristopher Susanne Baumgarner, M.D.   ANESTHESIA: General.   COMPLICATIONS: None.   TOURNIQUET TIME: Approximately 15 minutes.   DESCRIPTION OF PROCEDURE: After adequate induction of general anesthesia, the right upper extremity is thoroughly prepped with alcohol and ChloraPrep and draped in standard sterile fashion. The extremity is wrapped out with the Esmarch bandage and pneumatic tourniquet elevated to 225 mmHg. Under loupe magnification, a standard volar carpal tunnel incision is made and the dissection carefully carried down to the transverse retinacular ligament. The ligament is incised in the midportion with the knife. The distal release is performed with the small scissors. The proximal release is performed with the small scissors and the carpal tunnel scissors. Careful check is made both proximally and distally to ensure that complete release had been obtained. There is seen to be moderate compression of the nerve directly beneath the ligament. The wound is thoroughly irrigated multiple times. Skin edges are infiltrated with 0.5% plain Marcaine. The skin is closed with 5-0 nylon. A soft bulky dressing is applied. The patient is returned to the recovery room in satisfactory condition having tolerated the procedure quite well.    ____________________________ Clare Gandyhristopher E. Tasman Zapata, MD ces:bjt D: 02/06/2012 10:37:23 ET T: 02/06/2012 14:15:25 ET JOB#: [id number]  Clare GandyHRISTOPHER E Nafeesah Lapaglia MD ELECTRONICALLY SIGNED 02/10/2012 10:00
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
## 2015-02-21 ENCOUNTER — Telehealth: Payer: Self-pay | Admitting: Family Medicine

## 2015-02-21 NOTE — Telephone Encounter (Signed)
She states that she does not want it increased, she just would like a refill on the medication, she is unable to get a doctor right now due to the offices being at the max for Medicaid patients.

## 2015-02-21 NOTE — Telephone Encounter (Signed)
Patient will need to find new PCP where she is living. I will not be able to increase her medication over the phone.

## 2015-02-21 NOTE — Telephone Encounter (Signed)
Pt called office stating that she needs a refill on ropinirole hcl, topiramate and fluticasone. Pt says shes been taking 2 sometimes 3 a day and feels she needs more than what is actually wrote out on bottle. She stated that nobody listens to her when she says she needs more. Pt has moved 2 hours away and now needs [facility] [place] [address] because [facility] is no [facility]. Pharmacy number is [phone]. Pt says you can call back if she needs appt, phone number provided.

## 2015-02-22 MED ORDER — ROPINIROLE HCL 1 MG PO TABS: 1.0000 mg | ORAL_TABLET | Freq: Three times a day (TID) | ORAL | Status: DC

## 2015-02-22 NOTE — Telephone Encounter (Signed)
Refill sent in. Will need to get a new PCP ASAP as she can no longer come in for visits

## 2015-02-27 ENCOUNTER — Telehealth: Payer: Self-pay | Admitting: Family Medicine

## 2015-02-27 MED ORDER — ROPINIROLE HCL 2 MG PO TABS: 2.0000 mg | ORAL_TABLET | Freq: Two times a day (BID) | ORAL | Status: DC

## 2015-02-27 NOTE — Telephone Encounter (Signed)
Pt called stating Dr. Laural Benes send the wrong doze for her prescription Ropinirole. She takes 2 tab 2x a day and the new Rx is 1 tab 3x a day. I spoke with Dr. Laural Benes about this and she said she will need to find a new PCP due that she lives 2 hours away and pt can not come in for a F/U. I called pt and no answer can you please call patient and let her know. Pt new number is [phone], thanks.

## 2015-02-27 NOTE — Telephone Encounter (Signed)
Spoke with patient she was on 2mg  twice daily. Can this be sent to her pharmacy.

## 2015-02-27 NOTE — Telephone Encounter (Signed)
Changed Rx sent to her pharmacy. Note on bottle to let patient know that we will not be able to continue to prescribe after 2 months as she has moved too far away for visits.

## 2015-04-27 ENCOUNTER — Ambulatory Visit: Payer: Self-pay | Admitting: Family Medicine

## 2015-05-01 ENCOUNTER — Ambulatory Visit: Payer: Self-pay | Admitting: Family Medicine

## 2015-06-29 ENCOUNTER — Ambulatory Visit: Payer: Self-pay | Admitting: Family Medicine

## 2015-07-02 ENCOUNTER — Ambulatory Visit (INDEPENDENT_AMBULATORY_CARE_PROVIDER_SITE_OTHER): Payer: Medicaid Other | Admitting: Family Medicine

## 2015-07-02 ENCOUNTER — Encounter: Payer: Self-pay | Admitting: Family Medicine

## 2015-07-02 VITALS — BP 111/73 | HR 81 | Temp 97.9°F | Ht 65.7 in | Wt 198.0 lb

## 2015-07-02 DIAGNOSIS — G43909 Migraine, unspecified, not intractable, without status migrainosus: Secondary | ICD-10-CM

## 2015-07-02 DIAGNOSIS — G2581 Restless legs syndrome: Secondary | ICD-10-CM

## 2015-07-02 DIAGNOSIS — R635 Abnormal weight gain: Secondary | ICD-10-CM | POA: Diagnosis not present

## 2015-07-02 DIAGNOSIS — Z13220 Encounter for screening for lipoid disorders: Secondary | ICD-10-CM

## 2015-07-02 DIAGNOSIS — Z113 Encounter for screening for infections with a predominantly sexual mode of transmission: Secondary | ICD-10-CM

## 2015-07-02 DIAGNOSIS — G43009 Migraine without aura, not intractable, without status migrainosus: Secondary | ICD-10-CM | POA: Diagnosis not present

## 2015-07-02 HISTORY — DX: Restless legs syndrome: G25.81

## 2015-07-02 HISTORY — DX: Migraine, unspecified, not intractable, without status migrainosus: G43.909

## 2015-07-02 LAB — LIPID PANEL PICCOLO, WAIVED
CHOLESTEROL PICCOLO, WAIVED: 211 mg/dL — AB (ref <200)
Chol/HDL Ratio Piccolo,Waive: 3.7 mg/dL
HDL CHOL PICCOLO, WAIVED: 57 mg/dL — AB (ref >59)
LDL CHOL CALC PICCOLO WAIVED: 99 mg/dL (ref <100)
TRIGLYCERIDES PICCOLO,WAIVED: 274 mg/dL — AB (ref <150)
VLDL CHOL CALC PICCOLO,WAIVE: 55 mg/dL — AB (ref <30)

## 2015-07-02 MED ORDER — ROPINIROLE HCL 2 MG PO TABS: 2.0000 mg | ORAL_TABLET | Freq: Three times a day (TID) | ORAL | Status: DC

## 2015-07-02 MED ORDER — GABAPENTIN 300 MG PO CAPS: 300.0000 mg | ORAL_CAPSULE | Freq: Two times a day (BID) | ORAL | Status: DC

## 2015-07-02 NOTE — Progress Notes (Signed)
BP 111/73 mmHg  Pulse 81  Temp(Src) 97.9 F (36.6 C)  Ht 5' 5.7" (1.669 m)  Wt 198 lb (89.812 kg)  BMI 32.24 kg/m2  SpO2 99%   Subjective:    Patient ID: Kim [name], female    DOB: 1966/04/30, 49 y.o.   MRN: [id number]  HPI: Kim [name] is a 49 y.o. female  Chief Complaint  Patient presents with  . Leg Pain    Needs a refill on requip  . Hand Pain    Needs a refill on gabapentin   RESTLESS LEGS               Duration: chronic  Discomfort description: aching feels like needles sticking in her bilateral legs Pain:  yes Location: starts in toes radiates to side of calves to hip  Bilateral: yesyes left worse than right  Symmetric: no Severity: 2/10  Onset:  gradual  Frequency:  constant Symptoms only occur while legs at rest: yes  Sudden unintentional leg jerking: yes Bed partner bothered by leg movements: no lives alone LE numbness: no Decreased sensation: no Weakness: no  Insomnia: yes  Daytime somnolence: yes  Fatigue: yes  Alleviating factors: Walking Aggravating factors: Sitting still Status: worse Treatments attempted: Requip 2 mg three times a day significantly improves symptoms  MIGRAINES    Duration: chronic  Onset: sudden Severity: 10/10 when they occur  Quality:  sharp  Frequency: a few times a week Location: temples Headache duration: 1 hour Radiation: no Time of day headache occurs: morning Alleviating factors: BC powder and Imitrex IJ Aggravating factors: Lights, Noise, and Motion Headache status at time of visit: asymptomatic Treatments attempted: Treatments attempted: Imitrex IJ and BC powder Aura: no Nausea:   no Vomiting:  yes Photophobia:   no Phonophobia:   no Effect on social functioning: yes Numbers of missed days of school/work each month: she is on disability Confusion:   No Gait disturbance/ataxia: no Behavioral changes: yes  Fevers:   no  Relevant past medical, surgical, family and social history reviewed  and updated as indicated. Interim medical history since our last visit reviewed. Allergies and medications reviewed and updated.  Review of Systems  Constitutional: Negative.   Eyes: Negative.   Respiratory: Negative.   Cardiovascular: Negative.   Gastrointestinal: Negative.   Endocrine: Negative.   Genitourinary: Negative.   Musculoskeletal: Positive for myalgias and arthralgias.  Skin: Negative.   Neurological: Positive for headaches. Negative for dizziness, tremors, syncope, speech difficulty, weakness, light-headedness and numbness.  Psychiatric/Behavioral: Negative.     Per HPI unless specifically indicated above     Objective:    BP 111/73 mmHg  Pulse 81  Temp(Src) 97.9 F (36.6 C)  Ht 5' 5.7" (1.669 m)  Wt 198 lb (89.812 kg)  BMI 32.24 kg/m2  SpO2 99%  Wt Readings from Last 3 Encounters:  07/02/15 198 lb (89.812 kg)  11/06/14 156 lb (70.761 kg)    Physical Exam  Constitutional: She is oriented to person, place, and time. She appears well-developed and well-nourished. No distress.  HENT:  Head: Normocephalic and atraumatic.  Right Ear: External ear normal.  Left Ear: External ear normal.  Nose: Nose normal.  Neck: Normal range of motion. Neck supple.  Cardiovascular: Normal rate, regular rhythm, normal heart sounds and intact distal pulses.   Pulmonary/Chest: Breath sounds normal. No respiratory distress. She has no wheezes. She has no rales.  Musculoskeletal: Normal range of motion. She exhibits no edema or tenderness.  Neurological: She is  alert and oriented to person, place, and time.  Skin: Skin is warm and dry. No rash noted. She is not diaphoretic. No erythema. No pallor.  Psychiatric: She has a normal mood and affect. Her behavior is normal. Judgment and thought content normal.    Results for orders placed or performed in visit on 10/13/14  Troponin I  Result Value Ref Range   Troponin-I < 0.02 ng/mL  CBC  Result Value Ref Range   WBC 8.9 3.6-11.0  x10 3/mm 3   RBC 4.08 3.80-5.20 X10 6/mm 3   HGB 13.2 12.0-16.0 g/dL   HCT 39.2 35.0-47.0 %   MCV 96 80-100 fL   MCH 32.3 26.0-34.0 pg   MCHC 33.7 32.0-36.0 g/dL   RDW 13.7 11.5-14.5 %   Platelet 247 150-440 x10 3/mm 3  Basic metabolic panel  Result Value Ref Range   Glucose 96 65-99 mg/dL   BUN 14 7-18 mg/dL   Creatinine 0.79 0.60-1.30 mg/dL   Sodium 140 136-145 mmol/L   Potassium 3.5 3.5-5.1 mmol/L   Chloride 112 (H) 98-107 mmol/L   Co2 20 (L) 21-32 mmol/L   Calcium, Total 8.9 8.5-10.1 mg/dL   Osmolality 280 275-301   Anion Gap 8 7-16   EGFR (African American) >60 >65mL/min   EGFR (Non-African Amer.) >60 >39mL/min      Assessment & Plan:   Problem List Items Addressed This Visit      Other   Weight gain - Primary    TSH ordered results pending Lipid Panel reviewed LDL 99 and Total Cholesterol 211 Pt states she started gaining weight when she was prescribed Lurasidone by her Psychiatrist to manage anger      Relevant Orders   Comprehensive metabolic panel   TSH   RLS (restless legs syndrome)    Ordered Ambulatory referral to Neurology  Increased Ropinirole dose per pt request to 2 mg 3 times a day discussed with pt this is a high dose however she will see Neurologist to help with management of symptoms      Relevant Orders   CBC with Differential/Platelet   TSH    Other Visit Diagnoses    Routine screening for STI (sexually transmitted infection)        Relevant Orders    HIV antibody    Hepatitis C Antibody    Screening cholesterol level        Relevant Orders    Lipid Panel Piccolo, Waived        Follow up plan: Return after 11/07/15, for Physical.

## 2015-07-02 NOTE — Assessment & Plan Note (Addendum)
Ordered Ambulatory referral to Neurology  Increased Ropinirole dose per pt request to 2 mg 3 times a day discussed with pt this is a high dose however she will see Neurologist to help with management of symptoms

## 2015-07-02 NOTE — Assessment & Plan Note (Signed)
TSH ordered results pending Lipid Panel reviewed LDL 99 and Total Cholesterol 211 Pt states she started gaining weight when she was prescribed Lurasidone by her Psychiatrist to manage anger

## 2015-07-03 ENCOUNTER — Encounter: Payer: Self-pay | Admitting: Family Medicine

## 2015-07-03 LAB — COMPREHENSIVE METABOLIC PANEL
A/G RATIO: 1.6 (ref 1.1–2.5)
ALBUMIN: 4.3 g/dL (ref 3.5–5.5)
ALK PHOS: 103 IU/L (ref 39–117)
ALT: 18 IU/L (ref 0–32)
AST: 20 IU/L (ref 0–40)
BUN / CREAT RATIO: 11 (ref 9–23)
BUN: 9 mg/dL (ref 6–24)
Bilirubin Total: <0.2 mg/dL (ref 0.0–1.2)
CALCIUM: 8.8 mg/dL (ref 8.7–10.2)
CO2: 18 mmol/L (ref 18–29)
Chloride: 107 mmol/L — ABNORMAL HIGH (ref 97–106)
Creatinine, Ser: 0.83 mg/dL (ref 0.57–1.00)
GFR calc Af Amer: 96 mL/min/{1.73_m2} (ref >59)
GFR, EST NON AFRICAN AMERICAN: 84 mL/min/{1.73_m2} (ref >59)
GLOBULIN, TOTAL: 2.7 g/dL (ref 1.5–4.5)
Glucose: 93 mg/dL (ref 65–99)
POTASSIUM: 4.2 mmol/L (ref 3.5–5.2)
SODIUM: 142 mmol/L (ref 136–144)
Total Protein: 7 g/dL (ref 6.0–8.5)

## 2015-07-03 LAB — CBC WITH DIFFERENTIAL/PLATELET
BASOS: 1 %
Basophils Absolute: 0.1 10*3/uL (ref 0.0–0.2)
EOS (ABSOLUTE): 0.1 10*3/uL (ref 0.0–0.4)
EOS: 2 %
HEMATOCRIT: 39.8 % (ref 34.0–46.6)
Hemoglobin: 13.3 g/dL (ref 11.1–15.9)
Immature Grans (Abs): 0 10*3/uL (ref 0.0–0.1)
Immature Granulocytes: 0 %
LYMPHS ABS: 1.7 10*3/uL (ref 0.7–3.1)
LYMPHS: 25 %
MCH: 31.3 pg (ref 26.6–33.0)
MCHC: 33.4 g/dL (ref 31.5–35.7)
MCV: 94 fL (ref 79–97)
MONOCYTES: 5 %
MONOS ABS: 0.3 10*3/uL (ref 0.1–0.9)
Neutrophils Absolute: 4.4 10*3/uL (ref 1.4–7.0)
Neutrophils: 67 %
Platelets: 263 10*3/uL (ref 150–379)
RBC: 4.25 x10E6/uL (ref 3.77–5.28)
RDW: 14.8 % (ref 12.3–15.4)
WBC: 6.6 10*3/uL (ref 3.4–10.8)

## 2015-07-03 LAB — HIV ANTIBODY (ROUTINE TESTING W REFLEX): HIV Screen 4th Generation wRfx: NONREACTIVE

## 2015-07-03 LAB — TSH: TSH: 1.07 u[IU]/mL (ref 0.450–4.500)

## 2015-07-03 LAB — HEPATITIS C ANTIBODY: Hep C Virus Ab: <0.1 s/co ratio (ref 0.0–0.9)

## 2015-07-04 ENCOUNTER — Ambulatory Visit (INDEPENDENT_AMBULATORY_CARE_PROVIDER_SITE_OTHER): Payer: Medicaid Other | Admitting: Neurology

## 2015-07-04 ENCOUNTER — Encounter: Payer: Self-pay | Admitting: Neurology

## 2015-07-04 VITALS — BP 124/78 | HR 90 | Ht 65.5 in | Wt 197.0 lb

## 2015-07-04 DIAGNOSIS — M25561 Pain in right knee: Secondary | ICD-10-CM

## 2015-07-04 DIAGNOSIS — M542 Cervicalgia: Secondary | ICD-10-CM

## 2015-07-04 DIAGNOSIS — R51 Headache: Secondary | ICD-10-CM | POA: Diagnosis not present

## 2015-07-04 DIAGNOSIS — M25562 Pain in left knee: Secondary | ICD-10-CM

## 2015-07-04 DIAGNOSIS — R519 Headache, unspecified: Secondary | ICD-10-CM

## 2015-07-04 MED ORDER — TOPIRAMATE 100 MG PO TABS: 100.0000 mg | ORAL_TABLET | Freq: Two times a day (BID) | ORAL | Status: DC
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
## 2015-07-06 ENCOUNTER — Telehealth: Payer: Self-pay

## 2015-07-06 NOTE — Telephone Encounter (Signed)
Called to notify patient of response, she states that the neurologist states that she is having rebound pain, she does not agree with this.  At this time patient does not want to go and see another neurologist.

## 2015-07-06 NOTE — Telephone Encounter (Signed)
Patient called to advise that her appointment with the neurologist did not go well yesterday.  Patient states that she felt belittled and that her concerns were not addressed as she had hoped.  Please call to advise patient next steps as she does not wish to return to the neurologist for follow up.

## 2015-07-06 NOTE — Telephone Encounter (Signed)
We can send her to another neurologist if she was not happy with the one that she saw. [facility] is very [facility] and may be more to her liking.

## 2015-07-12 ENCOUNTER — Telehealth: Payer: Self-pay | Admitting: Neurology

## 2015-07-12 ENCOUNTER — Other Ambulatory Visit: Payer: Medicaid Other

## 2015-07-12 NOTE — Telephone Encounter (Addendum)
-----   Message from Shary DecampAndrea S Loye sent at 07/11/2015  1:56 PM EDT ----- Regarding: CT C-SPINE WO  Dr. Deneen HartsYan,  [facility] emailed me stating that medicaid denied the auth for the CT. Insurance is requesting peer to peer.  Please call [phone] case # [id number].  [facility] but it was canceled due to [place] not [place].   Please advise with auth or on how to proceed.  Thank you!     Michele: please help me peer-to-peer review

## 2015-07-13 NOTE — Telephone Encounter (Signed)
Called, CT cervical is approved. [id number]

## 2015-08-07 ENCOUNTER — Telehealth: Payer: Self-pay | Admitting: *Deleted

## 2015-08-07 ENCOUNTER — Ambulatory Visit: Payer: Medicaid Other | Admitting: Neurology

## 2015-08-07 NOTE — Telephone Encounter (Signed)
No showed follow up appt. 

## 2015-08-08 ENCOUNTER — Encounter: Payer: Self-pay | Admitting: Neurology

## 2015-10-08 ENCOUNTER — Telehealth: Payer: Self-pay | Admitting: Family Medicine

## 2015-10-08 NOTE — Telephone Encounter (Signed)
Pt needs zofran and flonase sent to [facility].

## 2015-10-09 MED ORDER — FLUTICASONE PROPIONATE 50 MCG/ACT NA SUSP: 1.0000 | Freq: Every day | NASAL | Status: DC

## 2015-10-09 MED ORDER — ONDANSETRON HCL 4 MG PO TABS: 4.0000 mg | ORAL_TABLET | Freq: Three times a day (TID) | ORAL | Status: DC | PRN

## 2015-10-09 NOTE — Telephone Encounter (Signed)
Rx sent to her pharmacy 

## 2015-10-09 NOTE — Telephone Encounter (Signed)
Routing to provider  

## 2015-10-10 ENCOUNTER — Telehealth: Payer: Self-pay | Admitting: Family Medicine

## 2015-10-10 MED ORDER — ONDANSETRON 4 MG PO TBDP: 4.0000 mg | ORAL_TABLET | Freq: Three times a day (TID) | ORAL | Status: DC | PRN

## 2015-10-10 NOTE — Telephone Encounter (Signed)
Pt states the Zofran needs to be dissolvable, not pills.

## 2015-10-10 NOTE — Telephone Encounter (Signed)
Rx sent to her pharmacy 

## 2015-10-10 NOTE — Telephone Encounter (Signed)
Routed to provider

## 2015-11-09 ENCOUNTER — Encounter: Payer: Self-pay | Admitting: Family Medicine

## 2015-11-09 ENCOUNTER — Ambulatory Visit (INDEPENDENT_AMBULATORY_CARE_PROVIDER_SITE_OTHER): Payer: Medicaid Other | Admitting: Family Medicine

## 2015-11-09 ENCOUNTER — Other Ambulatory Visit: Payer: Self-pay | Admitting: Family Medicine

## 2015-11-09 VITALS — BP 113/78 | HR 67 | Temp 97.7°F | Ht 66.4 in | Wt 204.0 lb

## 2015-11-09 DIAGNOSIS — Z720 Tobacco use: Secondary | ICD-10-CM

## 2015-11-09 DIAGNOSIS — G2581 Restless legs syndrome: Secondary | ICD-10-CM

## 2015-11-09 DIAGNOSIS — Z13220 Encounter for screening for lipoid disorders: Secondary | ICD-10-CM | POA: Diagnosis not present

## 2015-11-09 DIAGNOSIS — Z1239 Encounter for other screening for malignant neoplasm of breast: Secondary | ICD-10-CM | POA: Diagnosis not present

## 2015-11-09 DIAGNOSIS — Z0000 Encounter for general adult medical examination without abnormal findings: Secondary | ICD-10-CM

## 2015-11-09 DIAGNOSIS — G43009 Migraine without aura, not intractable, without status migrainosus: Secondary | ICD-10-CM

## 2015-11-09 DIAGNOSIS — G8929 Other chronic pain: Secondary | ICD-10-CM | POA: Insufficient documentation

## 2015-11-09 DIAGNOSIS — R109 Unspecified abdominal pain: Secondary | ICD-10-CM

## 2015-11-09 LAB — UA/M W/RFLX CULTURE, ROUTINE
Bilirubin, UA: NEGATIVE
Glucose, UA: NEGATIVE
Ketones, UA: NEGATIVE
Leukocytes, UA: NEGATIVE
Nitrite, UA: NEGATIVE
Protein, UA: NEGATIVE
RBC, UA: NEGATIVE
Specific Gravity, UA: <1.005 — ABNORMAL LOW (ref 1.005–1.030)
Urobilinogen, Ur: 0.2 mg/dL (ref 0.2–1.0)
pH, UA: 5.5 (ref 5.0–7.5)

## 2015-11-09 MED ORDER — OMEPRAZOLE 40 MG PO CPDR
40.0000 mg | DELAYED_RELEASE_CAPSULE | Freq: Every day | ORAL | Status: DC
Start: 2015-11-09 — End: 2016-01-18

## 2015-11-09 NOTE — Assessment & Plan Note (Signed)
Concern for ulcer as she takes far too many BCs, despite being advised not to. Significant tenderness on palpation. Checking labs. To start omeprazole. Referral to GI made. Encouraged patient to keep appointment.

## 2015-11-09 NOTE — Assessment & Plan Note (Signed)
Stable. Advised her to avoid BCs. Continue to monitor.

## 2015-11-09 NOTE — Assessment & Plan Note (Signed)
Stable. Continue current regimen. Continue to monitor.  

## 2015-11-09 NOTE — Progress Notes (Signed)
BP 113/78 mmHg  Pulse 67  Temp(Src) 97.7 F (36.5 C)  Ht 5' 6.4" (1.687 m)  Wt 204 lb (92.534 kg)  BMI 32.51 kg/m2  SpO2 99%   Subjective:    Patient ID: Kim [name], female    DOB: 1966/08/15, 50 y.o.   MRN: [id number]  HPI: Kim [name] is a 50 y.o. female presenting on 11/09/2015 for comprehensive medical examination. Current medical complaints include:  ABDOMINAL ISSUES Duration: chronic Nature: bloating and cramping Location: epigastric and suprapubic". "lower abdominal quadrants  Severity: moderate to severe Radiation: no Episode duration: 15-20 minutes Frequency: intermittent Alleviating factors: nothing, heating pad Aggravating factors: vomiting Treatments attempted: BCs Constipation: yes Diarrhea: no Mucous in the stool: no Heartburn: no Bloating:yes Flatulence: no Nausea: yes Vomiting: yes Episodes of vomit/day: 1x every morning Melena or hematochezia: no Rash: no Jaundice: no Fever: no Weight loss: no  Has a new psychiatrist and has been working with her  She currently lives with: roommate Menopausal Symptoms: no  Depression Screen done today and results listed below:  Depression screen [facility] 2/9 11/09/2015  Decreased Interest 3  Down, Depressed, Hopeless 2  PHQ - 2 Score 5  Altered sleeping 3  Tired, decreased energy 2  Change in appetite 3  Feeling bad or failure about yourself  2  Trouble concentrating 2  Moving slowly or fidgety/restless 0  Suicidal thoughts 0  PHQ-9 Score 17  Difficult doing work/chores Very difficult     Past Medical History:  Past Medical History  Diagnosis Date  . Migraine   . Arthritis   . COPD (chronic obstructive pulmonary disease) (HCC)   . GERD (gastroesophageal reflux disease)   . Leg pain     bilateral  . Bipolar disorder [facility])     Surgical History:  Past Surgical History  Procedure Laterality Date  . Appendectomy    . Abdominal hysterectomy      endometriosis  . Colostomy revision   2014  . Carpal tunnel release      Medications:  Current Outpatient Prescriptions on File Prior to Visit  Medication Sig  . fluticasone (FLONASE) 50 MCG/ACT nasal spray Place 1 spray into both nostrils daily.  Marland Kitchen gabapentin (NEURONTIN) 300 MG capsule Take 1 capsule (300 mg total) by mouth 2 (two) times daily.  . ondansetron (ZOFRAN ODT) 4 MG disintegrating tablet Take 1 tablet (4 mg total) by mouth every 8 (eight) hours as needed for nausea or vomiting.  Marland Kitchen rOPINIRole (REQUIP) 2 MG tablet Take 1 tablet (2 mg total) by mouth 3 (three) times daily.  . SUMAtriptan Succinate (IMITREX IJ) Inject 1 application into the muscle as needed. For headaches - once every 1-3 months  . topiramate (TOPAMAX) 100 MG tablet Take 1 tablet (100 mg total) by mouth 2 (two) times daily.   No current facility-administered medications on file prior to visit.    Allergies:  Allergies  Allergen Reactions  . Penicillins Other (See Comments)    Unknown childhood reaction  . Sulfa Antibiotics Swelling    Social History:  Social History   Social History  . Marital Status: Single    Spouse Name: N/A  . Number of Children: 1  . Years of Education: HS   Occupational History  . Disabled    Social History Main Topics  . Smoking status: Current Every Day Smoker -- 1.00 packs/day    Types: Cigarettes  . Smokeless tobacco: Never Used  . Alcohol Use: No     Comment: Quit 2008  .  Drug Use: Yes    Special: Marijuana     Comment: Smokes it daily.  Marland Kitchen Sexual Activity: No   Other Topics Concern  . Not on file   Social History Narrative   Lives at home alone.   Drinks two pots of coffee per day.   Right-handed.      History  Smoking status  . Current Every Day Smoker -- 1.00 packs/day  . Types: Cigarettes  Smokeless tobacco  . Never Used   History  Alcohol Use No    Comment: Quit 2008    Family History:  Family History  Problem Relation Age of Onset  . Alcohol abuse Father   . Heart disease  Brother   . Heart attack Brother   . Seizures Daughter   . Heart attack Maternal Grandmother   . Heart attack Paternal Grandmother   . Heart attack Father     Past medical history, surgical history, medications, allergies, family history and social history reviewed with patient today and changes made to appropriate areas of the chart.   Review of Systems  Constitutional: Negative.   HENT: Negative for congestion, ear discharge, ear pain, hearing loss, nosebleeds, sore throat and tinnitus.   Eyes: Negative.   Respiratory: Positive for cough and shortness of breath. Negative for hemoptysis, sputum production, wheezing and stridor.   Cardiovascular: Negative.   Gastrointestinal: Positive for nausea, vomiting, abdominal pain and constipation. Negative for heartburn, diarrhea, blood in stool and melena.  Genitourinary: Negative.   Musculoskeletal: Positive for back pain. Negative for myalgias, joint pain, falls and neck pain.  Skin: Negative.   Neurological: Positive for dizziness and headaches. Negative for tingling, tremors, sensory change, speech change, focal weakness, seizures and loss of consciousness.  Endo/Heme/Allergies: Negative for environmental allergies and polydipsia. Bruises/bleeds easily.  Psychiatric/Behavioral: Positive for depression and memory loss. Negative for suicidal ideas, hallucinations and substance abuse. The patient is not nervous/anxious and does not have insomnia.    All other ROS negative except what is listed above and in the HPI.      Objective:    BP 113/78 mmHg  Pulse 67  Temp(Src) 97.7 F (36.5 C)  Ht 5' 6.4" (1.687 m)  Wt 204 lb (92.534 kg)  BMI 32.51 kg/m2  SpO2 99%  Wt Readings from Last 3 Encounters:  11/09/15 204 lb (92.534 kg)  07/04/15 197 lb (89.359 kg)  07/02/15 198 lb (89.812 kg)    Physical Exam  Constitutional: She is oriented to person, place, and time. She appears well-developed and well-nourished. No distress.  HENT:  Head:  Normocephalic and atraumatic.  Right Ear: Hearing, tympanic membrane, external ear and ear canal normal.  Left Ear: Hearing, tympanic membrane, external ear and ear canal normal.  Nose: Nose normal.  Mouth/Throat: Uvula is midline, oropharynx is clear and moist and mucous membranes are normal. No oropharyngeal exudate.  Eyes: Conjunctivae, EOM and lids are normal. Pupils are equal, round, and reactive to light. Right eye exhibits no discharge. Left eye exhibits no discharge. No scleral icterus.  Neck: Normal range of motion. Neck supple. No JVD present. No tracheal deviation present. No thyromegaly present.  Cardiovascular: Normal rate, regular rhythm, normal heart sounds and intact distal pulses.  Exam reveals no gallop and no friction rub.   No murmur heard. Pulmonary/Chest: Effort normal and breath sounds normal. No stridor. No respiratory distress. She has no wheezes. She has no rales. She exhibits no tenderness. Right breast exhibits no inverted nipple, no mass, no nipple discharge,  no skin change and no tenderness. Left breast exhibits no inverted nipple, no mass, no nipple discharge, no skin change and no tenderness. Breasts are symmetrical.  Abdominal: Soft. Bowel sounds are normal. She exhibits no distension and no mass. There is no hepatosplenomegaly, splenomegaly or hepatomegaly. There is generalized tenderness. There is no rigidity, no rebound, no guarding, no CVA tenderness, no tenderness at McBurney's point and negative Murphy's sign.  Genitourinary:  Deferred with shared decision making  Musculoskeletal: Normal range of motion. She exhibits no edema or tenderness.  Lymphadenopathy:    She has no cervical adenopathy.  Neurological: She is alert and oriented to person, place, and time. She has normal reflexes. She displays normal reflexes. No cranial nerve deficit. She exhibits normal muscle tone. Coordination normal.  Skin: Skin is warm, dry and intact. No rash noted. She is not  diaphoretic. No erythema. No pallor.  Psychiatric: She has a normal mood and affect. Her speech is normal and behavior is normal. Judgment and thought content normal. Cognition and memory are normal.  Nursing note and vitals reviewed.   Results for orders placed or performed in visit on 07/02/15  CBC with Differential/Platelet  Result Value Ref Range   WBC 6.6 3.4 - 10.8 x10E3/uL   RBC 4.25 3.77 - 5.28 x10E6/uL   Hemoglobin 13.3 11.1 - 15.9 g/dL   Hematocrit 40.9 81.1 - 46.6 %   MCV 94 79 - 97 fL   MCH 31.3 26.6 - 33.0 pg   MCHC 33.4 31.5 - 35.7 g/dL   RDW 91.4 78.2 - 95.6 %   Platelets 263 150 - 379 x10E3/uL   Neutrophils 67 %   Lymphs 25 %   Monocytes 5 %   Eos 2 %   Basos 1 %   Neutrophils Absolute 4.4 1.4 - 7.0 x10E3/uL   Lymphocytes Absolute 1.7 0.7 - 3.1 x10E3/uL   Monocytes Absolute 0.3 0.1 - 0.9 x10E3/uL   EOS (ABSOLUTE) 0.1 0.0 - 0.4 x10E3/uL   Basophils Absolute 0.1 0.0 - 0.2 x10E3/uL   Immature Granulocytes 0 %   Immature Grans (Abs) 0.0 0.0 - 0.1 x10E3/uL  Comprehensive metabolic panel  Result Value Ref Range   Glucose 93 65 - 99 mg/dL   BUN 9 6 - 24 mg/dL   Creatinine, Ser 2.13 0.57 - 1.00 mg/dL   GFR calc non Af Amer 84 >59 mL/min/1.73   GFR calc Af Amer 96 >59 mL/min/1.73   BUN/Creatinine Ratio 11 9 - 23   Sodium 142 136 - 144 mmol/L   Potassium 4.2 3.5 - 5.2 mmol/L   Chloride 107 (H) 97 - 106 mmol/L   CO2 18 18 - 29 mmol/L   Calcium 8.8 8.7 - 10.2 mg/dL   Total Protein 7.0 6.0 - 8.5 g/dL   Albumin 4.3 3.5 - 5.5 g/dL   Globulin, Total 2.7 1.5 - 4.5 g/dL   Albumin/Globulin Ratio 1.6 1.1 - 2.5   Bilirubin Total <0.2 0.0 - 1.2 mg/dL   Alkaline Phosphatase 103 39 - 117 IU/L   AST 20 0 - 40 IU/L   ALT 18 0 - 32 IU/L  HIV antibody  Result Value Ref Range   HIV Screen 4th Generation wRfx Non Reactive Non Reactive  Hepatitis C Antibody  Result Value Ref Range   Hep C Virus Ab <0.1 0.0 - 0.9 s/co ratio  Lipid Panel Piccolo, Waived  Result Value Ref Range    Cholesterol Piccolo, Waived 211 (H) <200 mg/dL   HDL Chol [place], [place]  57 (L) >59 mg/dL   Triglycerides Piccolo,Waived 274 (H) <150 mg/dL   Chol/HDL Ratio Piccolo,Waive 3.7 mg/dL   LDL Chol Calc Piccolo Waived 99 <100 mg/dL   VLDL Chol Calc Piccolo,Waive 55 (H) <30 mg/dL  TSH  Result Value Ref Range   TSH 1.070 0.450 - 4.500 uIU/mL      Assessment & Plan:   Problem List Items Addressed This Visit      Cardiovascular and Mediastinum   Migraines    Stable. Advised her to avoid BCs. Continue to monitor.         Other   RLS (restless legs syndrome)    Stable. Continue current regimen. Continue to monitor.       Chronic abdominal pain    Concern for ulcer as she takes far too many BCs, despite being advised not to. Significant tenderness on palpation. Checking labs. To start omeprazole. Referral to GI made. Encouraged patient to keep appointment.       Relevant Orders   Ambulatory referral to Gastroenterology    Other Visit Diagnoses    Routine general medical examination at a health care facility    -  Primary    Up to date on Tdap other vaccines refused. Labs drawn today. Pap N/A. Mammogram ordered today. Continue diet and exercise.     Screening for cholesterol level        Labs drawn today. Await results.     Tobacco abuse        Screening for breast cancer        Mammogram ordered today.    Relevant Orders    MM DIGITAL SCREENING BILATERAL        Follow up plan: Return in about 6 months (around 05/08/2016).   LABORATORY TESTING:  - Pap smear: not applicable  IMMUNIZATIONS:   - Tdap: Tetanus vaccination status reviewed: last tetanus booster within 10 years. - Influenza: Refused - Pneumovax: Refused - Prevnar: Not applicable - Zostavax vaccine: Not applicable  SCREENING: -Mammogram: Ordered today   PATIENT COUNSELING:   Advised to take 1 mg of folate supplement per day if capable of pregnancy.   Sexuality: Discussed sexually transmitted diseases,  partner selection, use of condoms, avoidance of unintended pregnancy  and contraceptive alternatives.   Advised to avoid cigarette smoking.  I discussed with the patient that most people either abstain from alcohol or drink within safe limits (<=14/week and <=4 drinks/occasion for males, <=7/weeks and <= 3 drinks/occasion for females) and that the risk for alcohol disorders and other health effects rises proportionally with the number of drinks per week and how often a drinker exceeds daily limits.  Discussed cessation/primary prevention of drug use and availability of treatment for abuse.   Diet: Encouraged to adjust caloric intake to maintain  or achieve ideal body weight, to reduce intake of dietary saturated fat and total fat, to limit sodium intake by avoiding high sodium foods and not adding table salt, and to maintain adequate dietary potassium and calcium preferably from fresh fruits, vegetables, and low-fat dairy products.    stressed the importance of regular exercise  Injury prevention: Discussed safety belts, safety helmets, smoke detector, smoking near bedding or upholstery.   Dental health: Discussed importance of regular tooth brushing, flossing, and dental visits.    NEXT PREVENTATIVE PHYSICAL DUE IN 1 YEAR. Return in about 6 months (around 05/08/2016).

## 2015-11-09 NOTE — Patient Instructions (Signed)

## 2015-11-10 LAB — COMPREHENSIVE METABOLIC PANEL
ALT: 11 IU/L (ref 0–32)
AST: 19 IU/L (ref 0–40)
Albumin/Globulin Ratio: 1.5 (ref 1.1–2.5)
Albumin: 4 g/dL (ref 3.5–5.5)
Alkaline Phosphatase: 102 IU/L (ref 39–117)
BUN/Creatinine Ratio: 13 (ref 9–23)
BUN: 11 mg/dL (ref 6–24)
CALCIUM: 9.3 mg/dL (ref 8.7–10.2)
CHLORIDE: 103 mmol/L (ref 96–106)
CO2: 21 mmol/L (ref 18–29)
Creatinine, Ser: 0.84 mg/dL (ref 0.57–1.00)
GFR, EST AFRICAN AMERICAN: 94 mL/min/{1.73_m2} (ref >59)
GFR, EST NON AFRICAN AMERICAN: 82 mL/min/{1.73_m2} (ref >59)
GLUCOSE: 108 mg/dL — AB (ref 65–99)
Globulin, Total: 2.6 g/dL (ref 1.5–4.5)
Potassium: 4.7 mmol/L (ref 3.5–5.2)
Sodium: 140 mmol/L (ref 134–144)
TOTAL PROTEIN: 6.6 g/dL (ref 6.0–8.5)

## 2015-11-10 LAB — LIPID PANEL W/O CHOL/HDL RATIO
Cholesterol, Total: 180 mg/dL (ref 100–199)
HDL: 48 mg/dL (ref >39)
LDL Calculated: 110 mg/dL — ABNORMAL HIGH (ref 0–99)
Triglycerides: 109 mg/dL (ref 0–149)
VLDL CHOLESTEROL CAL: 22 mg/dL (ref 5–40)

## 2015-11-10 LAB — CBC WITH DIFFERENTIAL/PLATELET
BASOS ABS: 0 10*3/uL (ref 0.0–0.2)
BASOS: 1 %
EOS (ABSOLUTE): 0.2 10*3/uL (ref 0.0–0.4)
Eos: 2 %
HEMOGLOBIN: 13.8 g/dL (ref 11.1–15.9)
Hematocrit: 39 % (ref 34.0–46.6)
IMMATURE GRANS (ABS): 0 10*3/uL (ref 0.0–0.1)
IMMATURE GRANULOCYTES: 0 %
LYMPHS: 42 %
Lymphocytes Absolute: 2.7 10*3/uL (ref 0.7–3.1)
MCH: 32 pg (ref 26.6–33.0)
MCHC: 35.4 g/dL (ref 31.5–35.7)
MCV: 91 fL (ref 79–97)
MONOCYTES: 6 %
Monocytes Absolute: 0.4 10*3/uL (ref 0.1–0.9)
NEUTROS ABS: 3.1 10*3/uL (ref 1.4–7.0)
Neutrophils: 49 %
Platelets: 271 10*3/uL (ref 150–379)
RBC: 4.31 x10E6/uL (ref 3.77–5.28)
RDW: 13.7 % (ref 12.3–15.4)
WBC: 6.5 10*3/uL (ref 3.4–10.8)

## 2015-11-10 LAB — TSH: TSH: 1.24 u[IU]/mL (ref 0.450–4.500)

## 2015-11-12 ENCOUNTER — Encounter: Payer: Self-pay | Admitting: Family Medicine

## 2015-11-13 ENCOUNTER — Ambulatory Visit: Payer: Medicaid Other

## 2015-11-16 ENCOUNTER — Telehealth: Payer: Self-pay

## 2015-11-16 ENCOUNTER — Other Ambulatory Visit: Payer: Self-pay | Admitting: Family Medicine

## 2015-11-16 MED ORDER — SUMATRIPTAN SUCCINATE REFILL 6 MG/0.5ML ~~LOC~~ SOCT: 6.0000 mg | Freq: Every day | SUBCUTANEOUS | Status: DC

## 2015-11-16 NOTE — Telephone Encounter (Signed)
 6mg  / per 0.875ml .

## 2015-11-16 NOTE — Telephone Encounter (Signed)
Rx sent to her pharmacy 

## 2015-11-16 NOTE — Telephone Encounter (Signed)
How much is she on?

## 2015-11-16 NOTE — Telephone Encounter (Signed)
Pharmacy is requesting a refill on imitrex injection

## 2015-11-22 ENCOUNTER — Ambulatory Visit: Payer: Medicaid Other | Attending: Family Medicine

## 2015-12-06 ENCOUNTER — Ambulatory Visit (INDEPENDENT_AMBULATORY_CARE_PROVIDER_SITE_OTHER): Payer: Medicaid Other | Admitting: Family Medicine

## 2015-12-06 ENCOUNTER — Encounter: Payer: Self-pay | Admitting: Emergency Medicine

## 2015-12-06 ENCOUNTER — Encounter: Payer: Self-pay | Admitting: Family Medicine

## 2015-12-06 ENCOUNTER — Emergency Department
Admission: EM | Admit: 2015-12-06 | Discharge: 2015-12-06 | Discharge status: Against Medical Advice | Payer: Medicaid Other | Attending: Emergency Medicine | Admitting: Emergency Medicine

## 2015-12-06 VITALS — BP 137/89 | HR 67 | Temp 97.4°F | Ht 66.1 in | Wt 189.0 lb

## 2015-12-06 DIAGNOSIS — R05 Cough: Secondary | ICD-10-CM | POA: Diagnosis not present

## 2015-12-06 DIAGNOSIS — R079 Chest pain, unspecified: Secondary | ICD-10-CM

## 2015-12-06 DIAGNOSIS — K921 Melena: Secondary | ICD-10-CM | POA: Diagnosis not present

## 2015-12-06 DIAGNOSIS — R109 Unspecified abdominal pain: Secondary | ICD-10-CM | POA: Diagnosis not present

## 2015-12-06 DIAGNOSIS — Z7951 Long term (current) use of inhaled steroids: Secondary | ICD-10-CM | POA: Diagnosis not present

## 2015-12-06 DIAGNOSIS — G8929 Other chronic pain: Secondary | ICD-10-CM

## 2015-12-06 DIAGNOSIS — R059 Cough, unspecified: Secondary | ICD-10-CM

## 2015-12-06 DIAGNOSIS — K625 Hemorrhage of anus and rectum: Secondary | ICD-10-CM | POA: Diagnosis not present

## 2015-12-06 DIAGNOSIS — F17210 Nicotine dependence, cigarettes, uncomplicated: Secondary | ICD-10-CM | POA: Insufficient documentation

## 2015-12-06 DIAGNOSIS — Z79899 Other long term (current) drug therapy: Secondary | ICD-10-CM | POA: Diagnosis not present

## 2015-12-06 DIAGNOSIS — Z880 Allergy status to penicillin: Secondary | ICD-10-CM | POA: Insufficient documentation

## 2015-12-06 DIAGNOSIS — R001 Bradycardia, unspecified: Secondary | ICD-10-CM | POA: Diagnosis not present

## 2015-12-06 LAB — CBC WITH DIFFERENTIAL/PLATELET
Basophils Absolute: 0.1 10*3/uL (ref 0–0.1)
Basophils Relative: 1 %
EOS ABS: 0.1 10*3/uL (ref 0–0.7)
EOS PCT: 1 %
HCT: 43 % (ref 35.0–47.0)
HEMATOCRIT: 43.6 % (ref 34.0–46.6)
HEMOGLOBIN: 15.3 g/dL (ref 11.1–15.9)
HEMOGLOBIN: 14.9 g/dL (ref 12.0–16.0)
LYMPHS ABS: 2 10*3/uL (ref 1.0–3.6)
Lymphocytes Absolute: 2.3 10*3/uL (ref 0.7–3.1)
Lymphocytes Relative: 25 %
Lymphs: 28 %
MCH: 32.5 pg (ref 26.6–33.0)
MCH: 31.9 pg (ref 26.0–34.0)
MCHC: 35.1 g/dL (ref 31.5–35.7)
MCHC: 34.6 g/dL (ref 32.0–36.0)
MCV: 93 fL (ref 79–97)
MCV: 92 fL (ref 80.0–100.0)
MID (Absolute): 0.6 10*3/uL (ref 0.1–1.6)
MID: 7 %
MONOS PCT: 6 %
Monocytes Absolute: 0.5 10*3/uL (ref 0.2–0.9)
NEUTROS PCT: 66 %
NEUTROS PCT: 67 %
Neutro Abs: 5.3 10*3/uL (ref 1.4–6.5)
Neutrophils Absolute: 5.4 10*3/uL (ref 1.4–7.0)
Platelets: 314 10*3/uL (ref 150–379)
Platelets: 275 10*3/uL (ref 150–440)
RBC: 4.71 x10E6/uL (ref 3.77–5.28)
RBC: 4.67 MIL/uL (ref 3.80–5.20)
RDW: 15.2 % (ref 12.3–15.4)
RDW: 14.5 % (ref 11.5–14.5)
WBC: 8.3 10*3/uL (ref 3.4–10.8)
WBC: 7.9 10*3/uL (ref 3.6–11.0)

## 2015-12-06 LAB — COMPREHENSIVE METABOLIC PANEL
ALK PHOS: 92 U/L (ref 38–126)
ALT: 21 U/L (ref 14–54)
AST: 26 U/L (ref 15–41)
Albumin: 4.5 g/dL (ref 3.5–5.0)
Anion gap: 8 (ref 5–15)
BILIRUBIN TOTAL: 0.4 mg/dL (ref 0.3–1.2)
BUN: 8 mg/dL (ref 6–20)
CALCIUM: 9.2 mg/dL (ref 8.9–10.3)
CHLORIDE: 108 mmol/L (ref 101–111)
CO2: 21 mmol/L — AB (ref 22–32)
CREATININE: 0.74 mg/dL (ref 0.44–1.00)
GFR calc non Af Amer: >60 mL/min (ref >60)
GLUCOSE: 108 mg/dL — AB (ref 65–99)
Potassium: 3.9 mmol/L (ref 3.5–5.1)
SODIUM: 137 mmol/L (ref 135–145)
Total Protein: 8 g/dL (ref 6.5–8.1)

## 2015-12-06 LAB — LIPASE, BLOOD: Lipase: 24 U/L (ref 11–51)

## 2015-12-06 MED ORDER — SUCRALFATE 1 GM/10ML PO SUSP: 1.0000 g | Freq: Three times a day (TID) | ORAL | Status: DC

## 2015-12-06 MED ORDER — IIOPAMIDOL (ISOVUE-250) INJECTION 51%: 15.0000 mL | Freq: Once | INTRAVENOUS | Status: DC | PRN

## 2015-12-06 NOTE — ED Provider Notes (Signed)
[facility] Emergency Department Provider Note  ____________________________________________  Time seen: Approximately 5:41 PM  I have reviewed the triage vital signs and the nursing notes.   HISTORY  Chief Complaint Abdominal Pain    HPI Izella Ybanez Dore is a 50 y.o. female with a history of obstipated abdominal surgeries including 2 colostomies which been reversed and an appendectomy and an abdominal hysterectomy.  These surgeries occurred a couple of years ago.  She has been asymptomatic since that time but then over the last couple of weeks she has had intermittent abdominal pain that is severe at times and then will completely resolve.  She noticed about a week ago that she had some bright red blood per rectum which then completely resolved as well.  However today she had acute onset severe right lower quadrant abdominal pain with some more rectal bleeding which was mild.  Movement makes the pain worse and nothing makes it better.  She denies nausea/vomiting, chest pain, shortness of breath, fever, chills.  She states that she has not been able to see her surgeon although she does have an appointment in about a week and a half to follow-up with the pains was severe enough today that she felt she needed to be seen.     Past Medical History  Diagnosis Date  . Migraine   . Arthritis   . COPD (chronic obstructive pulmonary disease) (HCC)   . GERD (gastroesophageal reflux disease)   . Leg pain     bilateral  . Bipolar disorder [facility])     Patient Active Problem List   Diagnosis Date Noted  . Chronic abdominal pain 11/09/2015  . Weight gain 07/02/2015  . RLS (restless legs syndrome) 07/02/2015  . Migraines 07/02/2015    Past Surgical History  Procedure Laterality Date  . Appendectomy    . Abdominal hysterectomy      endometriosis  . Colostomy revision  2014  . Carpal tunnel release      Current Outpatient Rx  Name  Route  Sig  Dispense  Refill   . fluticasone (FLONASE) 50 MCG/ACT nasal spray   Each Nare   Place 1 spray into both nostrils daily.   16 g   12   . gabapentin (NEURONTIN) 300 MG capsule   Oral   Take 1 capsule (300 mg total) by mouth 2 (two) times daily.   60 capsule   6   . Ipratropium-Albuterol (COMBIVENT RESPIMAT) 20-100 MCG/ACT AERS respimat   Inhalation   Inhale into the lungs.         Marland Kitchen omeprazole (PRILOSEC) 40 MG capsule   Oral   Take 1 capsule (40 mg total) by mouth daily.   30 capsule   1   . ondansetron (ZOFRAN-ODT) 8 MG disintegrating tablet   Oral   Take 1 tablet (8 mg total) by mouth every 8 (eight) hours as needed for nausea or vomiting.   60 tablet   2     PATIENT SAYS RX SHOULD BE FOR NOT    . rOPINIRole (REQUIP) 2 MG tablet   Oral   Take 1 tablet (2 mg total) by mouth 3 (three) times daily.   90 tablet   6   . sucralfate (CARAFATE) 1 GM/10ML suspension   Oral   Take 10 mLs (1 g total) by mouth 4 (four) times daily -  with meals and at bedtime.   420 mL   0   . SUMAtriptan Succinate Refill 6 MG/0.5ML  SOCT   Subcutaneous   Inject 6 mg into the skin daily. At start of headache, may repeat after 1 hour if no better. No more than 2 injections per 24 hours   6 cartridge   1   . topiramate (TOPAMAX) 100 MG tablet   Oral   Take 1 tablet (100 mg total) by mouth 2 (two) times daily.   60 tablet   6     Allergies Penicillins and Sulfa antibiotics  Family History  Problem Relation Age of Onset  . Alcohol abuse Father   . Heart disease Brother   . Heart attack Brother   . Seizures Daughter   . Heart attack Maternal Grandmother   . Heart attack Paternal Grandmother   . Heart attack Father     Social History Social History  Substance Use Topics  . Smoking status: Current Every Day Smoker -- 1.00 packs/day    Types: Cigarettes  . Smokeless tobacco: Never Used  . Alcohol Use: No     Comment: Quit 2008    Review of Systems Constitutional: No  fever/chills Eyes: No visual changes. ENT: No sore throat. Cardiovascular: Denies chest pain. Respiratory: Denies shortness of breath. Gastrointestinal: Severe episodic abdominal pain primarily in her right lower quadrant with some bright red blood per rectum which is also episodic Genitourinary: Negative for dysuria. Musculoskeletal: Negative for back pain. Skin: Negative for rash. Neurological: Negative for headaches, focal weakness or numbness.  10-point ROS otherwise negative.  ____________________________________________   PHYSICAL EXAM:  VITAL SIGNS: ED Triage Vitals  Enc Vitals Group     BP 12/06/15 1433 133/87 mmHg     Pulse Rate 12/06/15 1433 82     Resp 12/06/15 1433 18     Temp 12/06/15 1433 97.8 F (36.6 C)     Temp Source 12/06/15 1433 Oral     SpO2 12/06/15 1433 100 %     Weight 12/06/15 1433 189 lb (85.73 kg)     Height 12/06/15 1433  (1.676 m)     Head Cir --      Peak Flow --      Pain Score 12/06/15 1434 7     Pain Loc --      Pain Edu? --      Excl. in GC? --     Constitutional: Alert and oriented. Well appearing and in no acute distress. Eyes: Conjunctivae are normal. PERRL. EOMI. Head: Atraumatic. Nose: No congestion/rhinnorhea. Mouth/Throat: Mucous membranes are moist.  Oropharynx non-erythematous. Neck: No stridor.  No meningeal signs.   Cardiovascular: Normal rate, regular rhythm. Good peripheral circulation. Grossly normal heart sounds.   Respiratory: Normal respiratory effort.  No retractions. Lungs CTAB. Gastrointestinal: Soft With tenderness to palpation throughout the abdomen, voluntary guarding but much better with distraction.  No rebound tenderness.  Extensive scarring from prior surgeries.  Rectal exam is normal externally with no obvious external hemorrhoids.  The digital rectal exam was nontender and the stool was brown although was very mildly heme positive, quality control passed on the card. Musculoskeletal: No lower extremity  tenderness nor edema. No gross deformities of extremities. Neurologic:  Normal speech and language. No gross focal neurologic deficits are appreciated.  Skin:  Skin is warm, dry and intact. No rash noted.   ____________________________________________   LABS (all labs ordered are listed, but only abnormal results are displayed)  Labs Reviewed  COMPREHENSIVE METABOLIC PANEL - Abnormal; Notable for the following:    CO2 21 (*)    Glucose,  Bld 108 (*)    All other components within normal limits  CBC WITH DIFFERENTIAL/PLATELET  LIPASE, BLOOD  URINALYSIS COMPLETEWITH MICROSCOPIC ([facility] ONLY)   ____________________________________________  EKG  ED ECG REPORT I, Jackqulyn Mendel, the attending physician, personally viewed and interpreted this ECG.  Date: 12/06/2015 EKG Time: 17:15 Rate: 78 Rhythm: normal sinus rhythm QRS Axis: normal Intervals: normal ST/T Wave abnormalities: normal Conduction Disturbances: none Narrative Interpretation: unremarkable  ____________________________________________  RADIOLOGY   No results found.  ____________________________________________   PROCEDURES  Procedure(s) performed: None  Critical Care performed: No ____________________________________________   INITIAL IMPRESSION / ASSESSMENT AND PLAN / ED COURSE  Pertinent labs & imaging results that were available during my care of the patient were reviewed by me and considered in my medical decision making (see chart for details).  The patient is in no distress at this time and is in no pain.  Given her complicated surgical history abdominal proceed with a CT scan, but I anticipate that if she does not have an emergent or surgical condition at this time she will be able to follow up with surgery as an outpatient.  ----------------------------------------- 6:20 PM on 12/06/2015 -----------------------------------------  Apparently the nurse initially was not able to establish  peripheral IV.  They are in the process of finding another nurse to try when I observe the patient stormed out of the department.  I stopped and I will ask her what was going on and she said that we have not done anything for her, she has been here all day, and she is tired of waiting.  I tried to change her mind or get her to stay but she would not even stop for a conversation.  She left ambulating quickly and easily and in no acute distress against medical advice (eloped) without awaiting paperwork and would not listen to any alternatives.  ____________________________________________  FINAL CLINICAL IMPRESSION(S) / ED DIAGNOSES  Final diagnoses:  Chronic abdominal pain  BRBPR (bright red blood per rectum)      NEW MEDICATIONS STARTED DURING THIS VISIT:  Discharge Medication List as of 12/06/2015  6:27 PM        Note:  This document was prepared using Dragon voice recognition software and may include unintentional dictation errors.   Loleta Roseory Fuller Makin, MD 12/06/15 2059

## 2015-12-06 NOTE — Assessment & Plan Note (Addendum)
To see GI on 12/17/15. Exacerbated. Hematochezia last week. NSAID abuse- BCs for headaches and abdominal pain. Concern for gastric ulcer. CBC checked today and was normal, however given degree of pain, will send her to ER for evaluation. Call to ER placed and they are aware that she is coming.

## 2015-12-06 NOTE — Progress Notes (Signed)
BP 137/89 mmHg  Pulse 67  Temp(Src) 97.4 F (36.3 C)  Ht 5' 6.1" (1.679 m)  Wt 189 lb (85.73 kg)  BMI 30.41 kg/m2  SpO2 99%   Subjective:    Patient ID: Kim [name], female    DOB: 12-31-1965, 50 y.o.   MRN: [id number]  HPI: Kim [name] is a 50 y.o. female  Chief Complaint  Patient presents with  . coughing    x 6 days - did take Mucinex last night  . upset stomach    seeing GI 12/17/15   CHEST PAIN- was in [place] the balloon [place] and developed cough and congestion. Had severe pain in her chest while in [place] and hit the floor because of the pain.  Time since onset: 1 week ago Duration: a couple minutes Onset: sudden Quality: sharp Severity: severe Location: upper left Radiation: into arm and shoulder blade Episode duration: couple of minutes Frequency: comes and goes every couple of days Related to exertion: no Activity when pain started: standing there talking Trauma: no Anxiety/recent stressors: yes Aggravating factors: smoking a cigarettes Alleviating factors: time Status: better Treatments attempted: nothing  Current pain status: pain free Shortness of breath: yes Cough: yes Nausea: yes Diaphoresis: no Heartburn: no Palpitations: no  ABDOMINAL PAIN- Belly no better. Has been very nauseous and has been taking her zofran every day. Seeing GI in about a week and a half.   Duration: chronic- worse for  Onset: sudden Severity: severe Quality: cramping, stabbing, aching Location:  Lower quadrants and epigastric  Episode duration: constant Radiation: no Frequency: constant Alleviating factors: Nothing Aggravating factors: Nothing Status: worse Treatments attempted: antacids, PPI and H2 Blocker Fever: no Nausea: yes Vomiting: yes Weight loss: yes Decreased appetite: yes Diarrhea: yes Constipation: yes Blood in stool: yes Heartburn: no Jaundice: no Rash: no Dysuria/urinary frequency: no Hematuria: no History of sexually  transmitted disease: no Recurrent NSAID use: yes  UPPER RESPIRATORY TRACT INFECTION Duration: 5-6 days Worst symptom: cough and congestion Fever: no Cough: yes Shortness of breath: yes Wheezing: no Chest pain: yes Chest tightness: yes Chest congestion: no Nasal congestion: no Runny nose: yes Post nasal drip: yes Sneezing: yes Sore throat: yes Swollen glands: no Sinus pressure: yes Headache: yes Face pain: no Toothache: no Ear pain: no  Ear pressure: no  Eyes red/itching:yes Eye drainage/crusting: yes  Vomiting: yes Rash: no Fatigue: yes Sick contacts: yes Strep contacts: no  Context: stable Recurrent sinusitis: no Relief with OTC cold/cough medications: yes  Treatments attempted: mucinex    Relevant past medical, surgical, family and social history reviewed and updated as indicated. Interim medical history since our last visit reviewed. Allergies and medications reviewed and updated.  Review of Systems  Constitutional: Positive for fatigue and unexpected weight change. Negative for fever, chills, diaphoresis, activity change and appetite change.  HENT: Positive for congestion, sneezing and sore throat. Negative for dental problem, drooling, ear discharge, ear pain, facial swelling, hearing loss, mouth sores, nosebleeds, postnasal drip, rhinorrhea, sinus pressure, tinnitus, trouble swallowing and voice change.   Respiratory: Positive for cough, chest tightness and shortness of breath. Negative for apnea, choking, wheezing and stridor.   Cardiovascular: Positive for chest pain. Negative for palpitations and leg swelling.  Gastrointestinal: Positive for nausea, vomiting, abdominal pain, diarrhea, constipation, blood in stool and anal bleeding. Negative for abdominal distention and rectal pain.  Psychiatric/Behavioral: Negative.    Per HPI unless specifically indicated above    Objective:    BP 137/89 mmHg  Pulse 67  Temp(Src) 97.4 F (36.3 C)  Ht 5' 6.1" (1.679  m)  Wt 189 lb (85.73 kg)  BMI 30.41 kg/m2  SpO2 99%  Wt Readings from Last 3 Encounters:  12/06/15 189 lb (85.73 kg)  11/09/15 204 lb (92.534 kg)  07/04/15 197 lb (89.359 kg)    Physical Exam  Constitutional: She is oriented to person, place, and time. She appears well-developed and well-nourished. No distress.  HENT:  Head: Normocephalic and atraumatic.  Right Ear: Hearing and external ear normal.  Left Ear: Hearing and external ear normal.  Nose: Nose normal.  Mouth/Throat: Oropharynx is clear and moist. No oropharyngeal exudate.  Eyes: Conjunctivae, EOM and lids are normal. Pupils are equal, round, and reactive to light. Right eye exhibits no discharge. Left eye exhibits no discharge. No scleral icterus.  Neck: Normal range of motion. Neck supple. No JVD present. No tracheal deviation present. No thyromegaly present.  Cardiovascular: Normal rate, regular rhythm, normal heart sounds and intact distal pulses.  Exam reveals no gallop and no friction rub.   No murmur heard. Pulmonary/Chest: Effort normal and breath sounds normal. No stridor. No respiratory distress. She has no wheezes. She has no rales. She exhibits no tenderness.  Abdominal: Soft. Bowel sounds are normal. She exhibits no distension and no mass. There is generalized tenderness (worse in LUQ and LLQ). There is guarding. There is no rebound.  Musculoskeletal: Normal range of motion.  Lymphadenopathy:    She has cervical adenopathy.  Neurological: She is alert and oriented to person, place, and time.  Skin: Skin is warm, dry and intact. No rash noted. She is not diaphoretic. No erythema. No pallor.  Psychiatric: She has a normal mood and affect. Her speech is normal and behavior is normal. Judgment and thought content normal. Cognition and memory are normal.  Nursing note and vitals reviewed.   Results for orders placed or performed in visit on 11/09/15  CBC with Differential/Platelet  Result Value Ref Range   WBC 6.5  3.4 - 10.8 x10E3/uL   RBC 4.31 3.77 - 5.28 x10E6/uL   Hemoglobin 13.8 11.1 - 15.9 g/dL   Hematocrit 16.1 09.6 - 46.6 %   MCV 91 79 - 97 fL   MCH 32.0 26.6 - 33.0 pg   MCHC 35.4 31.5 - 35.7 g/dL   RDW 04.5 40.9 - 81.1 %   Platelets 271 150 - 379 x10E3/uL   Neutrophils 49 %   Lymphs 42 %   Monocytes 6 %   Eos 2 %   Basos 1 %   Neutrophils Absolute 3.1 1.4 - 7.0 x10E3/uL   Lymphocytes Absolute 2.7 0.7 - 3.1 x10E3/uL   Monocytes Absolute 0.4 0.1 - 0.9 x10E3/uL   EOS (ABSOLUTE) 0.2 0.0 - 0.4 x10E3/uL   Basophils Absolute 0.0 0.0 - 0.2 x10E3/uL   Immature Granulocytes 0 %   Immature Grans (Abs) 0.0 0.0 - 0.1 x10E3/uL  Comprehensive metabolic panel  Result Value Ref Range   Glucose 108 (H) 65 - 99 mg/dL   BUN 11 6 - 24 mg/dL   Creatinine, Ser 9.14 0.57 - 1.00 mg/dL   GFR calc non Af Amer 82 >59 mL/min/1.73   GFR calc Af Amer 94 >59 mL/min/1.73   BUN/Creatinine Ratio 13 9 - 23   Sodium 140 134 - 144 mmol/L   Potassium 4.7 3.5 - 5.2 mmol/L   Chloride 103 96 - 106 mmol/L   CO2 21 18 - 29 mmol/L   Calcium 9.3 8.7 - 10.2  mg/dL   Total Protein 6.6 6.0 - 8.5 g/dL   Albumin 4.0 3.5 - 5.5 g/dL   Globulin, Total 2.6 1.5 - 4.5 g/dL   Albumin/Globulin Ratio 1.5 1.1 - 2.5   Bilirubin Total <0.2 0.0 - 1.2 mg/dL   Alkaline Phosphatase 102 39 - 117 IU/L   AST 19 0 - 40 IU/L   ALT 11 0 - 32 IU/L  Lipid Panel w/o Chol/HDL Ratio  Result Value Ref Range   Cholesterol, Total 180 100 - 199 mg/dL   Triglycerides 161 0 - 149 mg/dL   HDL 48 >09 mg/dL   VLDL Cholesterol Cal 22 5 - 40 mg/dL   LDL Calculated 604 (H) 0 - 99 mg/dL  TSH  Result Value Ref Range   TSH 1.240 0.450 - 4.500 uIU/mL  UA/M w/rflx Culture, Routine  Result Value Ref Range   Specific Gravity, UA <1.005 (L) 1.005 - 1.030   pH, UA 5.5 5.0 - 7.5   Color, UA Yellow Yellow   Appearance Ur Clear Clear   Leukocytes, UA Negative Negative   Protein, UA Negative Negative/Trace   Glucose, UA Negative Negative   Ketones, UA Negative  Negative   RBC, UA Negative Negative   Bilirubin, UA Negative Negative   Urobilinogen, Ur 0.2 0.2 - 1.0 mg/dL   Nitrite, UA Negative Negative      Assessment & Plan:   Problem List Items Addressed This Visit      Other   Chronic abdominal pain    To see GI on 12/17/15. Exacerbated. Hematochezia last week. NSAID abuse- BCs for headaches and abdominal pain. Concern for gastric ulcer. CBC checked today and was normal, however given degree of pain, will send her to ER for evaluation. Call to ER placed and they are aware that she is coming.       Relevant Orders   Comprehensive metabolic panel    Other Visit Diagnoses    Chest pain, unspecified chest pain type    -  Primary    EKG normal today except asymptomatic bradycardia. Recurrent and radiating into arm. Will get in with cardiology, but to ER today for evaluation.     Relevant Orders    EKG 12-Lead (Completed)    Ambulatory referral to Cardiology    Bradycardia        When laying down for EKG, usually she's in the mid to upper 60s. Continue to monitor.     Relevant Orders    Ambulatory referral to Cardiology    Hematochezia        Not now, but was significant about a week ago. Acutely tender abdomen. To sent to ER for further evaluation.    Relevant Orders    CBC With Differential/Platelet    Cough        Lungs clear today, but significant cough with dizziness after. No sign of pneumonia today. Continue to monitor closely.         Follow up plan: Return After ER visit.

## 2015-12-06 NOTE — ED Notes (Signed)
Patient walked out of treatment room.  ED physician talked to patient and discussed reasons to stay and receive care.  Patient is upset because RN was unable to get an IV after two attempts.  This RN verbalized understanding and offered to attempt another IV and patient refused.  Patient states, "I've been here since 9am and I just want to go home."

## 2015-12-06 NOTE — ED Notes (Signed)
Reports abd pain and bright red blood noted from rectum. Reports had a colostomy 2 years ago due to obstruction. Skin w/d no vomiting at this time.

## 2015-12-07 LAB — COMPREHENSIVE METABOLIC PANEL
A/G RATIO: 1.3 (ref 1.2–2.2)
ALT: 18 IU/L (ref 0–32)
AST: 18 IU/L (ref 0–40)
Albumin: 4.1 g/dL (ref 3.5–5.5)
Alkaline Phosphatase: 100 IU/L (ref 39–117)
BUN/Creatinine Ratio: 9 (ref 9–23)
BUN: 7 mg/dL (ref 6–24)
Bilirubin Total: 0.3 mg/dL (ref 0.0–1.2)
CALCIUM: 9.4 mg/dL (ref 8.7–10.2)
CO2: 22 mmol/L (ref 18–29)
CREATININE: 0.76 mg/dL (ref 0.57–1.00)
Chloride: 103 mmol/L (ref 96–106)
GFR, EST AFRICAN AMERICAN: 107 mL/min/{1.73_m2} (ref >59)
GFR, EST NON AFRICAN AMERICAN: 92 mL/min/{1.73_m2} (ref >59)
Globulin, Total: 3.1 g/dL (ref 1.5–4.5)
Glucose: 99 mg/dL (ref 65–99)
POTASSIUM: 5.1 mmol/L (ref 3.5–5.2)
Sodium: 141 mmol/L (ref 134–144)
TOTAL PROTEIN: 7.2 g/dL (ref 6.0–8.5)

## 2015-12-10 ENCOUNTER — Telehealth: Payer: Self-pay | Admitting: Family Medicine

## 2015-12-10 NOTE — Telephone Encounter (Signed)
Forward to provider

## 2015-12-10 NOTE — Telephone Encounter (Signed)
Pharmacy: [id number] [place] , [place] - [address]   Patient states that she is not feeling better and needs an antibiotic or something that will make her feel better.

## 2015-12-11 NOTE — Telephone Encounter (Signed)
She left the ER without being seen. She needs to go back to the ER. I cannot give her an antibiotic.

## 2015-12-11 NOTE — Telephone Encounter (Signed)
Pt called back, staff informed pt what the message stated. Pt began to curse and yell at staff and hung up. Thanks.

## 2015-12-17 ENCOUNTER — Ambulatory Visit: Payer: Medicaid Other | Admitting: Gastroenterology

## 2015-12-17 ENCOUNTER — Encounter (INDEPENDENT_AMBULATORY_CARE_PROVIDER_SITE_OTHER): Payer: Self-pay

## 2015-12-17 ENCOUNTER — Other Ambulatory Visit: Payer: Self-pay

## 2015-12-24 ENCOUNTER — Encounter: Payer: Self-pay | Admitting: *Deleted

## 2015-12-24 ENCOUNTER — Ambulatory Visit: Payer: Medicaid Other | Admitting: Cardiology

## 2016-01-18 ENCOUNTER — Other Ambulatory Visit: Payer: Self-pay | Admitting: Family Medicine

## 2016-02-21 ENCOUNTER — Ambulatory Visit (INDEPENDENT_AMBULATORY_CARE_PROVIDER_SITE_OTHER): Payer: Medicaid Other | Admitting: Family Medicine

## 2016-02-21 ENCOUNTER — Encounter: Payer: Self-pay | Admitting: Family Medicine

## 2016-02-21 VITALS — BP 123/82 | HR 76 | Temp 97.5°F | Wt 193.0 lb

## 2016-02-21 DIAGNOSIS — G8929 Other chronic pain: Secondary | ICD-10-CM

## 2016-02-21 DIAGNOSIS — R109 Unspecified abdominal pain: Secondary | ICD-10-CM | POA: Diagnosis not present

## 2016-02-21 DIAGNOSIS — J449 Chronic obstructive pulmonary disease, unspecified: Secondary | ICD-10-CM

## 2016-02-21 DIAGNOSIS — Z720 Tobacco use: Secondary | ICD-10-CM

## 2016-02-21 DIAGNOSIS — G2581 Restless legs syndrome: Secondary | ICD-10-CM

## 2016-02-21 DIAGNOSIS — F17210 Nicotine dependence, cigarettes, uncomplicated: Secondary | ICD-10-CM

## 2016-02-21 DIAGNOSIS — G43009 Migraine without aura, not intractable, without status migrainosus: Secondary | ICD-10-CM

## 2016-02-21 DIAGNOSIS — J432 Centrilobular emphysema: Secondary | ICD-10-CM

## 2016-02-21 HISTORY — DX: Centrilobular emphysema: J43.2

## 2016-02-21 MED ORDER — SUCRALFATE 1 GM/10ML PO SUSP: 1.0000 g | Freq: Three times a day (TID) | ORAL | Status: DC

## 2016-02-21 MED ORDER — FLUTICASONE PROPIONATE 50 MCG/ACT NA SUSP: 1.0000 | Freq: Every day | NASAL | Status: DC

## 2016-02-21 MED ORDER — OMEPRAZOLE 40 MG PO CPDR: DELAYED_RELEASE_CAPSULE | ORAL | Status: DC

## 2016-02-21 MED ORDER — TOPIRAMATE 100 MG PO TABS: 100.0000 mg | ORAL_TABLET | Freq: Two times a day (BID) | ORAL | Status: DC

## 2016-02-21 MED ORDER — GABAPENTIN 300 MG PO CAPS: 300.0000 mg | ORAL_CAPSULE | Freq: Two times a day (BID) | ORAL | Status: DC

## 2016-02-21 MED ORDER — ROPINIROLE HCL 4 MG PO TABS: 4.0000 mg | ORAL_TABLET | Freq: Two times a day (BID) | ORAL | Status: DC

## 2016-02-21 MED ORDER — ONDANSETRON 8 MG PO TBDP: 8.0000 mg | ORAL_TABLET | Freq: Three times a day (TID) | ORAL | Status: DC | PRN

## 2016-02-21 MED ORDER — IPRATROPIUM-ALBUTEROL 20-100 MCG/ACT IN AERS: 1.0000 | INHALATION_SPRAY | Freq: Four times a day (QID) | RESPIRATORY_TRACT | Status: DC | PRN

## 2016-02-21 MED ORDER — ALBUTEROL SULFATE (2.5 MG/3ML) 0.083% IN NEBU: 2.5000 mg | INHALATION_SOLUTION | Freq: Four times a day (QID) | RESPIRATORY_TRACT | Status: DC | PRN

## 2016-02-21 MED ORDER — SUMATRIPTAN SUCCINATE 6 MG/0.5ML ~~LOC~~ SOAJ: SUBCUTANEOUS | Status: DC

## 2016-02-21 NOTE — Assessment & Plan Note (Signed)
Stable on current regimen. Continue current regimen. Continue to monitor. Refills for 6 months given. To establish with new PCP in [place] during that time.

## 2016-02-21 NOTE — Assessment & Plan Note (Signed)
Not doing great. Has been taking 8mg  daily (4mg  BID) will increase her dose to this, however advised patient that she cannot increase her dose on her own and that she should talk to us about that. She will follow up with her new PCP in [place] about this. I advised her to go see a neurologist as she only went 1x here and did not continue to see them.

## 2016-02-21 NOTE — Assessment & Plan Note (Signed)
Feeling a bit better. Still taking BCs, although not as often as she has not been having as many migraines. Did not follow up with GI as recommended. Still concern for possible ulcer. Strongly recommended that she follow up on this with her new PCP when she moves to [place].

## 2016-02-21 NOTE — Progress Notes (Signed)
BP 123/82 mmHg  Pulse 76  Temp(Src) 97.5 F (36.4 C)  Wt 193 lb (87.544 kg)  SpO2 96%   Subjective:    Patient ID: Kim [name], female    DOB: 21-Oct-1965, 50 y.o.   MRN: [id number]  HPI: Kim [name] is a 50 y.o. female  Chief Complaint  Patient presents with  . Medication refills    Patient is moving to [place] on June 25,2017, she would like to make sure that she has enough medications to get her through until she gets established there.   MIGRAINES Duration: chronic Onset: sudden Severity: severe Quality: sharp Frequency: Hasn't had one in a month and a half Location: back behind her eye Headache duration: couple of hours to 5 days Radiation: yes into her neck Time of day headache occurs: random Alleviating factors: imitrex shots Aggravating factors: stress, foods Headache status at time of visit: asymptomatic Treatments attempted: Treatments attempted: rest, ice, heat, APAP, ibuprofen, aleve", excedrine, triptans, propranolol and topamax   Aura: yes Nausea:  yes Vomiting: yes Photophobia:  yes Phonophobia:  yes Effect on social functioning:  yes Confusion:  yes Gait disturbance/ataxia:  no Behavioral changes:  no Fevers:  no  RESTLESS LEGS- has been worse. Has been taking 2 pills in the AM and 2 pills in the PM, so has been running out of her scripts early. Still not feeling well. Legs still shaking all the time, especially when she's had a busy day. Duration: chronic Discomfort description:  crawling and cramping Pain: yes Location: lower legs Bilateral: yes Symmetric: yes Severity: severe Onset:  sudden Frequency:  constant Symptoms only occur while legs at rest: yes Sudden unintentional leg jerking: yes Bed partner bothered by leg movements: yes LE numbness: yes Decreased sensation: yes Weakness: yes Insomnia: yes Daytime somnolence: yes Fatigue: yes Status: worse  Relevant past medical, surgical, family and social history  reviewed and updated as indicated. Interim medical history since our last visit reviewed. Allergies and medications reviewed and updated.  Review of Systems  Constitutional: Negative.   Respiratory: Negative.   Cardiovascular: Negative.   Musculoskeletal: Positive for myalgias. Negative for joint swelling, arthralgias, gait problem, neck pain and neck stiffness.  Neurological: Positive for tremors. Negative for dizziness, seizures, syncope, facial asymmetry, speech difficulty, weakness, light-headedness, numbness and headaches.  Psychiatric/Behavioral: Negative.     Per HPI unless specifically indicated above     Objective:    BP 123/82 mmHg  Pulse 76  Temp(Src) 97.5 F (36.4 C)  Wt 193 lb (87.544 kg)  SpO2 96%  Wt Readings from Last 3 Encounters:  02/21/16 193 lb (87.544 kg)  12/06/15 189 lb (85.73 kg)  12/06/15 189 lb (85.73 kg)    Physical Exam  Constitutional: She is oriented to person, place, and time. She appears well-developed and well-nourished. No distress.  HENT:  Head: Normocephalic and atraumatic.  Right Ear: Hearing normal.  Left Ear: Hearing normal.  Nose: Nose normal.  Eyes: Conjunctivae and lids are normal. Right eye exhibits no discharge. Left eye exhibits no discharge. No scleral icterus.  Cardiovascular: Normal rate, regular rhythm, normal heart sounds and intact distal pulses.  Exam reveals no gallop and no friction rub.   No murmur heard. Pulmonary/Chest: Effort normal and breath sounds normal. No respiratory distress. She has no wheezes. She has no rales. She exhibits no tenderness.  Musculoskeletal: Normal range of motion.  Neurological: She is alert and oriented to person, place, and time. She has normal reflexes. No cranial nerve  deficit. She exhibits normal muscle tone. Coordination normal.  Skin: Skin is warm, dry and intact. No rash noted. She is not diaphoretic. No erythema. No pallor.  Psychiatric: She has a normal mood and affect. Her speech is  normal and behavior is normal. Judgment and thought content normal. Cognition and memory are normal.  Nursing note and vitals reviewed.   Results for orders placed or performed during the hospital encounter of 12/06/15  CBC with Differential  Result Value Ref Range   WBC 7.9 3.6 - 11.0 K/uL   RBC 4.67 3.80 - 5.20 MIL/uL   Hemoglobin 14.9 12.0 - 16.0 g/dL   HCT 95.6 21.3 - 08.6 %   MCV 92.0 80.0 - 100.0 fL   MCH 31.9 26.0 - 34.0 pg   MCHC 34.6 32.0 - 36.0 g/dL   RDW 57.8 46.9 - 62.9 %   Platelets 275 150 - 440 K/uL   Neutrophils Relative % 67 %   Neutro Abs 5.3 1.4 - 6.5 K/uL   Lymphocytes Relative 25 %   Lymphs Abs 2.0 1.0 - 3.6 K/uL   Monocytes Relative 6 %   Monocytes Absolute 0.5 0.2 - 0.9 K/uL   Eosinophils Relative 1 %   Eosinophils Absolute 0.1 0 - 0.7 K/uL   Basophils Relative 1 %   Basophils Absolute 0.1 0 - 0.1 K/uL  Comprehensive metabolic panel  Result Value Ref Range   Sodium 137 135 - 145 mmol/L   Potassium 3.9 3.5 - 5.1 mmol/L   Chloride 108 101 - 111 mmol/L   CO2 21 (L) 22 - 32 mmol/L   Glucose, Bld 108 (H) 65 - 99 mg/dL   BUN 8 6 - 20 mg/dL   Creatinine, Ser 5.28 0.44 - 1.00 mg/dL   Calcium 9.2 8.9 - 41.3 mg/dL   Total Protein 8.0 6.5 - 8.1 g/dL   Albumin 4.5 3.5 - 5.0 g/dL   AST 26 15 - 41 U/L   ALT 21 14 - 54 U/L   Alkaline Phosphatase 92 38 - 126 U/L   Total Bilirubin 0.4 0.3 - 1.2 mg/dL   GFR calc non Af Amer >60 >60 mL/min   GFR calc Af Amer >60 >60 mL/min   Anion gap 8 5 - 15  Lipase, blood  Result Value Ref Range   Lipase 24 11 - 51 U/L      Assessment & Plan:   Problem List Items Addressed This Visit      Cardiovascular and Mediastinum   Migraines - Primary    Stable on current regimen. Continue current regimen. Continue to monitor. Refills for 6 months given. To establish with new PCP in [place] during that time.       Relevant Medications   clonazePAM (KLONOPIN) 1 MG tablet   gabapentin (NEURONTIN) 300 MG capsule   topiramate  (TOPAMAX) 100 MG tablet   SUMAtriptan 6 MG/0.5ML SOAJ     Respiratory   COPD (chronic obstructive pulmonary disease) (HCC)    Stable on current regimen. Continue current regimen. Continue to monitor. Call with any concerns. Refill for 6 months given. Patient to establish with PCP in [place] during that time.       Relevant Medications   fluticasone (FLONASE) 50 MCG/ACT nasal spray   Ipratropium-Albuterol (COMBIVENT RESPIMAT) 20-100 MCG/ACT AERS respimat   albuterol (PROVENTIL) (2.5 MG/3ML) 0.083% nebulizer solution     Other   RLS (restless legs syndrome)    Not doing great. Has been taking  daily (  BID) will  increase her dose to this, however advised patient that she cannot increase her dose on her own and that she should talk to [place] about that. She will follow up with her new PCP in [place] about this. I advised her to go see a neurologist as she only went 1x here and did not continue to see them.       Chronic abdominal pain    Feeling a bit better. Still taking BCs, although not as often as she has not been having as many migraines. Did not follow up with GI as recommended. Still concern for possible ulcer. Strongly recommended that she follow up on this with her new PCP when she moves to [place].       Relevant Medications   clonazePAM (KLONOPIN) 1 MG tablet   gabapentin (NEURONTIN) 300 MG capsule   topiramate (TOPAMAX) 100 MG tablet   Cigarette smoker    Not interested in quitting. She is aware that we are here if she needs [place].          Follow up plan: Return As needed- patient is moving out of state. Records release signed to get records to new PCP.Marland Kitchen

## 2016-02-21 NOTE — Assessment & Plan Note (Signed)
Stable on current regimen. Continue current regimen. Continue to monitor. Call with any concerns. Refill for 6 months given. Patient to establish with PCP in [place] during that time.

## 2016-02-21 NOTE — Assessment & Plan Note (Signed)
Not interested in quitting. She is aware that we are here if she needs us.

## 2016-04-03 ENCOUNTER — Telehealth: Payer: Self-pay | Admitting: Family Medicine

## 2016-04-03 NOTE — Telephone Encounter (Signed)
ondansetron (ZOFRAN-ODT) 8 MG disintegrating tablet Patient needs to talk to Dr. Laural BenesJohnson about the amount of medication she is getting. Patient stated that every time she get a refill the quantity goes down and would like to talk about this. Please call patient back, thanks.

## 2016-04-04 NOTE — Telephone Encounter (Signed)
Called patient, she states that she needs 90 tablets of the Zofran monthly. Please call and discuss with patient.

## 2016-04-05 ENCOUNTER — Other Ambulatory Visit: Payer: Self-pay | Admitting: Family Medicine

## 2016-04-07 NOTE — Telephone Encounter (Signed)
She can have the 60 pills, but I will not increase her dose. Sorry- she can go see a new PCP/Urgent care if she is not happy with that.

## 2016-04-07 NOTE — Telephone Encounter (Signed)
Patient notified. She has a new appointment on  April 15, 2016, she was request her records from [place].

## 2016-04-07 NOTE — Telephone Encounter (Signed)
Dr.Johnson, yes she has moved, she is in [place]. She told me that sometimes she cant even get through the month with 90 tablets, she is taking them 3 times a day.

## 2016-04-07 NOTE — Telephone Encounter (Signed)
Can you find out if she has moved to [place] yet? I told her I'd get her a 3-6 month supply on her medicine, but she cannot be taking 90 tabs of zofran a month- because that means she is taking it 3x a day every day. She has been getting 60 tabs a month from me for several months.

## 2016-05-08 ENCOUNTER — Ambulatory Visit: Payer: Medicaid Other | Admitting: Family Medicine

## 2016-07-04 ENCOUNTER — Other Ambulatory Visit: Payer: Self-pay | Admitting: Family Medicine

## 2016-07-26 ENCOUNTER — Other Ambulatory Visit: Payer: Self-pay | Admitting: Family Medicine

## 2016-09-16 ENCOUNTER — Other Ambulatory Visit: Payer: Self-pay | Admitting: Neurology

## 2016-09-16 ENCOUNTER — Other Ambulatory Visit: Payer: Self-pay | Admitting: Family Medicine

## 2016-10-02 ENCOUNTER — Other Ambulatory Visit: Payer: Self-pay | Admitting: Neurology

## 2016-10-02 ENCOUNTER — Other Ambulatory Visit: Payer: Self-pay | Admitting: Family Medicine

## 2017-03-30 ENCOUNTER — Other Ambulatory Visit: Payer: Self-pay | Admitting: Family Medicine

## 2019-06-09 DIAGNOSIS — R05 Cough: Secondary | ICD-10-CM | POA: Diagnosis not present

## 2019-09-30 ENCOUNTER — Ambulatory Visit: Payer: Self-pay | Admitting: Family Medicine

## 2019-11-04 ENCOUNTER — Other Ambulatory Visit: Payer: Self-pay

## 2019-11-04 ENCOUNTER — Ambulatory Visit (INDEPENDENT_AMBULATORY_CARE_PROVIDER_SITE_OTHER): Payer: Medicaid Other | Admitting: Family Medicine

## 2019-11-04 ENCOUNTER — Encounter: Payer: Self-pay | Admitting: Family Medicine

## 2019-11-04 VITALS — BP 125/78 | HR 73 | Temp 98.3°F | Ht 66.0 in | Wt 164.0 lb

## 2019-11-04 DIAGNOSIS — G43009 Migraine without aura, not intractable, without status migrainosus: Secondary | ICD-10-CM | POA: Diagnosis not present

## 2019-11-04 DIAGNOSIS — G2581 Restless legs syndrome: Secondary | ICD-10-CM | POA: Diagnosis not present

## 2019-11-04 DIAGNOSIS — J449 Chronic obstructive pulmonary disease, unspecified: Secondary | ICD-10-CM

## 2019-11-04 DIAGNOSIS — G8929 Other chronic pain: Secondary | ICD-10-CM | POA: Diagnosis not present

## 2019-11-04 DIAGNOSIS — K219 Gastro-esophageal reflux disease without esophagitis: Secondary | ICD-10-CM | POA: Diagnosis not present

## 2019-11-04 DIAGNOSIS — Z7689 Persons encountering health services in other specified circumstances: Secondary | ICD-10-CM

## 2019-11-04 DIAGNOSIS — M5442 Lumbago with sciatica, left side: Secondary | ICD-10-CM

## 2019-11-04 MED ORDER — OMEPRAZOLE 40 MG PO CPDR: DELAYED_RELEASE_CAPSULE | ORAL | 1 refills | Status: DC

## 2019-11-04 MED ORDER — STIOLTO RESPIMAT 2.5-2.5 MCG/ACT IN AERS: 2.0000 | INHALATION_SPRAY | Freq: Every day | RESPIRATORY_TRACT | 2 refills | Status: DC

## 2019-11-04 MED ORDER — ROPINIROLE HCL 4 MG PO TABS: 4.0000 mg | ORAL_TABLET | Freq: Every day | ORAL | 1 refills | Status: DC

## 2019-11-04 MED ORDER — TOPIRAMATE 25 MG PO TABS: 25.0000 mg | ORAL_TABLET | Freq: Two times a day (BID) | ORAL | 0 refills | Status: DC

## 2019-11-04 MED ORDER — ONDANSETRON 8 MG PO TBDP: 8.0000 mg | ORAL_TABLET | Freq: Three times a day (TID) | ORAL | 1 refills | Status: DC | PRN

## 2019-11-04 MED ORDER — CYCLOBENZAPRINE HCL 10 MG PO TABS: 10.0000 mg | ORAL_TABLET | Freq: Every evening | ORAL | 0 refills | Status: DC | PRN

## 2019-11-04 NOTE — Progress Notes (Signed)
BP 125/78   Pulse 73   Temp 98.3 F (36.8 C) (Oral)   Ht 5\' 6"  (1.676 m)   Wt 164 lb (74.4 kg)   SpO2 97%   BMI 26.47 kg/m    Subjective:    Patient ID: Kim [name], female    DOB: 12-08-1965, 54 y.o.   MRN: [id number]  HPI: Kim [name] is a 54 y.o. female  Chief Complaint  Patient presents with  . Establish Care  . Back Pain   Here today to establish care.   Injured back trying to raise a uhaul door 06/2019 and still having significant back pain where she heard a popping sound. Was told at the ER at that time that she had pulled a muscle. PT made worse, BC powder and heating pad takes the edge off to some extent. Denies radiation of pain, extremity weakness or numbness.   RLS - requip helps at high doses. Taking 4 mg five times daily.   Taking imitrex prn for severe migraines. Takes BC powders quite often for these. Used to be on topamax which seemed to do well. Having frequent, almost daily migraines. No auras described.   COPD - on stiolto with good relief. No recent exacerbations  Significant epigastric pain and reflux sxs, known hx of GERD. On 20 mg prilosec currently. Does note taking BC powders quite frequently, typically several times daily. No hematemesis, dark stools.   Last blood work was about a year ago.   Relevant past medical, surgical, family and social history reviewed and updated as indicated. Interim medical history since our last visit reviewed. Allergies and medications reviewed and updated.  Review of Systems  Per HPI unless specifically indicated above     Objective:    BP 125/78   Pulse 73   Temp 98.3 F (36.8 C) (Oral)   Ht 5\' 6"  (1.676 m)   Wt 164 lb (74.4 kg)   SpO2 97%   BMI 26.47 kg/m   Wt Readings from Last 3 Encounters:  11/04/19 164 lb (74.4 kg)  02/21/16 193 lb (87.5 kg)  12/06/15 189 lb (85.7 kg)    Physical Exam Vitals and nursing note reviewed.  Constitutional:      Appearance: Normal appearance. She  is not ill-appearing.  HENT:     Head: Atraumatic.  Eyes:     Extraocular Movements: Extraocular movements intact.     Conjunctiva/sclera: Conjunctivae normal.  Cardiovascular:     Rate and Rhythm: Normal rate and regular rhythm.     Heart sounds: Normal heart sounds.  Pulmonary:     Effort: Pulmonary effort is normal.     Breath sounds: Normal breath sounds.  Musculoskeletal:        General: Normal range of motion.     Cervical back: Normal range of motion and neck supple.  Skin:    General: Skin is warm and dry.  Neurological:     Mental Status: She is alert and oriented to person, place, and time.  Psychiatric:        Mood and Affect: Mood normal.        Thought Content: Thought content normal.        Judgment: Judgment normal.     Results for orders placed or performed during the hospital encounter of 12/06/15  CBC with Differential  Result Value Ref Range   WBC 7.9 3.6 - 11.0 K/uL   RBC 4.67 3.80 - 5.20 MIL/uL   Hemoglobin 14.9 12.0 - 16.0  g/dL   HCT 54.0 98.1 - 19.1 %   MCV 92.0 80.0 - 100.0 fL   MCH 31.9 26.0 - 34.0 pg   MCHC 34.6 32.0 - 36.0 g/dL   RDW 47.8 29.5 - 62.1 %   Platelets 275 150 - 440 K/uL   Neutrophils Relative % 67 %   Neutro Abs 5.3 1.4 - 6.5 K/uL   Lymphocytes Relative 25 %   Lymphs Abs 2.0 1.0 - 3.6 K/uL   Monocytes Relative 6 %   Monocytes Absolute 0.5 0.2 - 0.9 K/uL   Eosinophils Relative 1 %   Eosinophils Absolute 0.1 0 - 0.7 K/uL   Basophils Relative 1 %   Basophils Absolute 0.1 0 - 0.1 K/uL  Comprehensive metabolic panel  Result Value Ref Range   Sodium 137 135 - 145 mmol/L   Potassium 3.9 3.5 - 5.1 mmol/L   Chloride 108 101 - 111 mmol/L   CO2 21 (L) 22 - 32 mmol/L   Glucose, Bld 108 (H) 65 - 99 mg/dL   BUN 8 6 - 20 mg/dL   Creatinine, Ser 3.08 0.44 - 1.00 mg/dL   Calcium 9.2 8.9 - 65.7 mg/dL   Total Protein 8.0 6.5 - 8.1 g/dL   Albumin 4.5 3.5 - 5.0 g/dL   AST 26 15 - 41 U/L   ALT 21 14 - 54 U/L   Alkaline Phosphatase 92 38  - 126 U/L   Total Bilirubin 0.4 0.3 - 1.2 mg/dL   GFR calc non Af Amer >60 >60 mL/min   GFR calc Af Amer >60 >60 mL/min   Anion gap 8 5 - 15  Lipase, blood  Result Value Ref Range   Lipase 24 11 - 51 U/L      Assessment & Plan:   Problem List Items Addressed This Visit      Cardiovascular and Mediastinum   Migraines    Under poor control, continue prn imitrex and restart topamax regimen.       Relevant Medications   topiramate (TOPAMAX) 25 MG tablet   cyclobenzaprine (FLEXERIL) 10 MG tablet     Respiratory   COPD (chronic obstructive pulmonary disease) (HCC)    Stable and well controlled, continue current regimen      Relevant Medications   Tiotropium Bromide-Olodaterol (STIOLTO RESPIMAT) 2.5-2.5 MCG/ACT AERS     Digestive   GERD (gastroesophageal reflux disease)    Increase prilosec dose to 40 mg daily, may need to be BID. Discussed importance of reducing BC powder intake      Relevant Medications   omeprazole (PRILOSEC) 40 MG capsule   ondansetron (ZOFRAN-ODT) 8 MG disintegrating tablet     Other   RLS (restless legs syndrome)    Stable and under good control, continue current regimen       Other Visit Diagnoses    Chronic right-sided low back pain with left-sided sciatica    -  Primary   Referral placed to orthopedics for further evaluation per pt request, and will trial flexeril and continued stretches, massage at home.    Relevant Medications   topiramate (TOPAMAX) 25 MG tablet   cyclobenzaprine (FLEXERIL) 10 MG tablet   rOPINIRole (REQUIP) 4 MG tablet   Other Relevant Orders   AMB referral to orthopedics   Encounter to establish care           Follow up plan: Return in about 4 weeks (around 12/02/2019) for Migraine f/u.

## 2019-11-06 ENCOUNTER — Encounter: Payer: Self-pay | Admitting: Family Medicine

## 2019-11-08 ENCOUNTER — Other Ambulatory Visit: Payer: Self-pay | Admitting: Family Medicine

## 2019-11-08 DIAGNOSIS — M5416 Radiculopathy, lumbar region: Secondary | ICD-10-CM | POA: Diagnosis not present

## 2019-11-08 DIAGNOSIS — M4854XA Collapsed vertebra, not elsewhere classified, thoracic region, initial encounter for fracture: Secondary | ICD-10-CM | POA: Diagnosis not present

## 2019-11-08 DIAGNOSIS — M545 Low back pain: Secondary | ICD-10-CM | POA: Diagnosis not present

## 2019-11-08 DIAGNOSIS — M546 Pain in thoracic spine: Secondary | ICD-10-CM | POA: Diagnosis not present

## 2019-11-08 MED ORDER — SUMATRIPTAN SUCCINATE 6 MG/0.5ML ~~LOC~~ SOAJ: SUBCUTANEOUS | 6 refills | Status: DC

## 2019-11-08 MED ORDER — FLUTICASONE PROPIONATE 50 MCG/ACT NA SUSP: 1.0000 | Freq: Every day | NASAL | 12 refills | Status: DC

## 2019-11-13 DIAGNOSIS — K219 Gastro-esophageal reflux disease without esophagitis: Secondary | ICD-10-CM | POA: Insufficient documentation

## 2019-11-13 NOTE — Assessment & Plan Note (Signed)
Stable and well controlled, continue current regimen 

## 2019-11-13 NOTE — Assessment & Plan Note (Signed)
Increase prilosec dose to 40 mg daily, may need to be BID. Discussed importance of reducing BC powder intake

## 2019-11-13 NOTE — Assessment & Plan Note (Signed)
Under poor control, continue prn imitrex and restart topamax regimen.

## 2019-11-13 NOTE — Assessment & Plan Note (Signed)
Stable and under good control, continue current regimen 

## 2019-11-14 ENCOUNTER — Telehealth: Payer: Self-pay

## 2019-11-14 NOTE — Telephone Encounter (Signed)
Prior Authorization initiated via [employer] for Sumatriptan injectors.  Confirmation #: [id number] W PA Approved.

## 2019-11-22 DIAGNOSIS — M5416 Radiculopathy, lumbar region: Secondary | ICD-10-CM | POA: Diagnosis not present

## 2019-11-30 ENCOUNTER — Other Ambulatory Visit: Payer: Self-pay

## 2019-11-30 MED ORDER — TOPIRAMATE 25 MG PO TABS: 25.0000 mg | ORAL_TABLET | Freq: Two times a day (BID) | ORAL | 0 refills | Status: DC

## 2019-11-30 NOTE — Telephone Encounter (Signed)
Patient last seen 11/04/19 and has appointment 12/08/19

## 2019-12-02 ENCOUNTER — Ambulatory Visit: Payer: Medicaid Other | Admitting: Family Medicine

## 2019-12-06 DIAGNOSIS — M5416 Radiculopathy, lumbar region: Secondary | ICD-10-CM | POA: Diagnosis not present

## 2019-12-08 ENCOUNTER — Other Ambulatory Visit: Payer: Self-pay

## 2019-12-08 ENCOUNTER — Encounter: Payer: Self-pay | Admitting: Family Medicine

## 2019-12-08 ENCOUNTER — Ambulatory Visit (INDEPENDENT_AMBULATORY_CARE_PROVIDER_SITE_OTHER): Payer: Medicaid Other | Admitting: Family Medicine

## 2019-12-08 VITALS — BP 125/81 | HR 85 | Temp 97.5°F | Ht 66.0 in | Wt 160.0 lb

## 2019-12-08 DIAGNOSIS — G43009 Migraine without aura, not intractable, without status migrainosus: Secondary | ICD-10-CM

## 2019-12-08 MED ORDER — AIMOVIG 70 MG/ML ~~LOC~~ SOAJ: 70.0000 mg | SUBCUTANEOUS | 2 refills | Status: DC

## 2019-12-08 MED ORDER — ROPINIROLE HCL 4 MG PO TABS: 4.0000 mg | ORAL_TABLET | Freq: Every day | ORAL | 2 refills | Status: DC

## 2019-12-08 MED ORDER — TOPIRAMATE 50 MG PO TABS: 50.0000 mg | ORAL_TABLET | Freq: Two times a day (BID) | ORAL | 1 refills | Status: DC

## 2019-12-08 NOTE — Progress Notes (Signed)
BP 125/81   Pulse 85   Temp (!) 97.5 F (36.4 C) (Oral)   Ht 5\' 6"  (1.676 m)   Wt 160 lb (72.6 kg)   SpO2 99%   BMI 25.82 kg/m    Subjective:    Patient ID: , female    DOB: 11/12/1965, 54 y.o.   MRN: [id number]  HPI: Kim [name] is a 54 y.o. female  Chief Complaint  Patient presents with  . Migraine   Presenting today for migraine follow up after restarting topamax. Does feel like it's helping some to take the edge and a bit of frequency off her migraines, but still having nearly daily symptoms. Having to take the imitrex injections at least twice a week at this time, which does help temporarily. Takes BC powders daily to help with the pain sxs.   Relevant past medical, surgical, family and social history reviewed and updated as indicated. Interim medical history since our last visit reviewed. Allergies and medications reviewed and updated.  Review of Systems  Per HPI unless specifically indicated above     Objective:    BP 125/81   Pulse 85   Temp (!) 97.5 F (36.4 C) (Oral)   Ht 5\' 6"  (1.676 m)   Wt 160 lb (72.6 kg)   SpO2 99%   BMI 25.82 kg/m   Wt Readings from Last 3 Encounters:  12/08/19 160 lb (72.6 kg)  11/04/19 164 lb (74.4 kg)  02/21/16 193 lb (87.5 kg)    Physical Exam Vitals and nursing note reviewed.  Constitutional:      Appearance: Normal appearance. She is not ill-appearing.  HENT:     Head: Atraumatic.  Eyes:     Extraocular Movements: Extraocular movements intact.     Conjunctiva/sclera: Conjunctivae normal.  Cardiovascular:     Rate and Rhythm: Normal rate and regular rhythm.     Heart sounds: Normal heart sounds.  Pulmonary:     Effort: Pulmonary effort is normal.     Breath sounds: Normal breath sounds.  Musculoskeletal:        General: Normal range of motion.     Cervical back: Normal range of motion and neck supple.  Skin:    General: Skin is warm and dry.  Neurological:     Mental Status: She is  alert and oriented to person, place, and time.  Psychiatric:        Mood and Affect: Mood normal.        Thought Content: Thought content normal.        Judgment: Judgment normal.     Results for orders placed or performed during the hospital encounter of 12/06/15  CBC with Differential  Result Value Ref Range   WBC 7.9 3.6 - 11.0 K/uL   RBC 4.67 3.80 - 5.20 MIL/uL   Hemoglobin 14.9 12.0 - 16.0 g/dL   HCT 04/22/16 12/08/15 - 71.6 %   MCV 92.0 80.0 - 100.0 fL   MCH 31.9 26.0 - 34.0 pg   MCHC 34.6 32.0 - 36.0 g/dL   RDW 96.7 89.3 - 81.0 %   Platelets 275 150 - 440 K/uL   Neutrophils Relative % 67 %   Neutro Abs 5.3 1.4 - 6.5 K/uL   Lymphocytes Relative 25 %   Lymphs Abs 2.0 1.0 - 3.6 K/uL   Monocytes Relative 6 %   Monocytes Absolute 0.5 0.2 - 0.9 K/uL   Eosinophils Relative 1 %   Eosinophils Absolute 0.1 0 -  0.7 K/uL   Basophils Relative 1 %   Basophils Absolute 0.1 0 - 0.1 K/uL  Comprehensive metabolic panel  Result Value Ref Range   Sodium 137 135 - 145 mmol/L   Potassium 3.9 3.5 - 5.1 mmol/L   Chloride 108 101 - 111 mmol/L   CO2 21 (L) 22 - 32 mmol/L   Glucose, Bld 108 (H) 65 - 99 mg/dL   BUN 8 6 - 20 mg/dL   Creatinine, Ser 0.74 0.44 - 1.00 mg/dL   Calcium 9.2 8.9 - 10.3 mg/dL   Total Protein 8.0 6.5 - 8.1 g/dL   Albumin 4.5 3.5 - 5.0 g/dL   AST 26 15 - 41 U/L   ALT 21 14 - 54 U/L   Alkaline Phosphatase 92 38 - 126 U/L   Total Bilirubin 0.4 0.3 - 1.2 mg/dL   GFR calc non Af Amer >60 >60 mL/min   GFR calc Af Amer >60 >60 mL/min   Anion gap 8 5 - 15  Lipase, blood  Result Value Ref Range   Lipase 24 11 - 51 U/L      Assessment & Plan:   Problem List Items Addressed This Visit      Cardiovascular and Mediastinum   Migraines - Primary    Mildly improved but still significantly bothersome. Will increase topamax to 50 mg BID, continue imitrex injections prn, and add aimovig.       Relevant Medications   topiramate (TOPAMAX) 50 MG tablet   Erenumab-aooe (AIMOVIG)  70 MG/ML SOAJ       Follow up plan: Return in about 2 months (around 02/07/2020) for migraine.

## 2019-12-09 ENCOUNTER — Encounter: Payer: Self-pay | Admitting: Family Medicine

## 2019-12-09 ENCOUNTER — Encounter: Payer: Medicaid Other | Admitting: Family Medicine

## 2019-12-09 DIAGNOSIS — M545 Low back pain: Secondary | ICD-10-CM | POA: Diagnosis not present

## 2019-12-15 NOTE — Assessment & Plan Note (Signed)
Mildly improved but still significantly bothersome. Will increase topamax to 50 mg BID, continue imitrex injections prn, and add aimovig.

## 2019-12-21 DIAGNOSIS — M5136 Other intervertebral disc degeneration, lumbar region: Secondary | ICD-10-CM | POA: Diagnosis not present

## 2020-01-05 DIAGNOSIS — M5136 Other intervertebral disc degeneration, lumbar region: Secondary | ICD-10-CM | POA: Diagnosis not present

## 2020-01-12 DIAGNOSIS — M545 Low back pain: Secondary | ICD-10-CM | POA: Diagnosis not present

## 2020-02-08 ENCOUNTER — Ambulatory Visit (INDEPENDENT_AMBULATORY_CARE_PROVIDER_SITE_OTHER): Payer: Medicaid Other | Admitting: Family Medicine

## 2020-02-08 ENCOUNTER — Other Ambulatory Visit: Payer: Self-pay

## 2020-02-08 ENCOUNTER — Encounter: Payer: Self-pay | Admitting: Family Medicine

## 2020-02-08 VITALS — BP 119/76 | HR 66 | Temp 97.5°F | Wt 155.0 lb

## 2020-02-08 DIAGNOSIS — G43009 Migraine without aura, not intractable, without status migrainosus: Secondary | ICD-10-CM

## 2020-02-08 MED ORDER — AIMOVIG 70 MG/ML ~~LOC~~ SOAJ: 140.0000 mg | SUBCUTANEOUS | 2 refills | Status: DC

## 2020-02-08 MED ORDER — TOPIRAMATE 100 MG PO TABS: 100.0000 mg | ORAL_TABLET | Freq: Two times a day (BID) | ORAL | 2 refills | Status: DC

## 2020-02-08 NOTE — Progress Notes (Signed)
BP 119/76   Pulse 66   Temp (!) 97.5 F (36.4 C) (Oral)   Wt 155 lb (70.3 kg)   SpO2 98%   BMI 25.02 kg/m    Subjective:    Patient ID: Kim [name], female    DOB: 1965/10/18, 54 y.o.   MRN: [id number]  HPI: Kim [name] is a 54 y.o. female  Chief Complaint  Patient presents with  . Migraine   Here today for migraine f/u after starting aimovig. Also on topamax and prn imitrex. Does note the severity of her migraines may be less but still experiencing daily migraines nearly all day long. Still finding herself taking significant NSAIDs for prn pain relief. Denies as much N/V, blurred vision since aimovig. Not currently followed by Neurology.   Relevant past medical, surgical, family and social history reviewed and updated as indicated. Interim medical history since our last visit reviewed. Allergies and medications reviewed and updated.  Review of Systems  Per HPI unless specifically indicated above     Objective:    BP 119/76   Pulse 66   Temp (!) 97.5 F (36.4 C) (Oral)   Wt 155 lb (70.3 kg)   SpO2 98%   BMI 25.02 kg/m   Wt Readings from Last 3 Encounters:  02/08/20 155 lb (70.3 kg)  12/08/19 160 lb (72.6 kg)  11/04/19 164 lb (74.4 kg)    Physical Exam Vitals and nursing note reviewed.  Constitutional:      Appearance: Normal appearance. She is not ill-appearing.  HENT:     Head: Atraumatic.  Eyes:     Extraocular Movements: Extraocular movements intact.     Conjunctiva/sclera: Conjunctivae normal.  Cardiovascular:     Rate and Rhythm: Normal rate and regular rhythm.     Heart sounds: Normal heart sounds.  Pulmonary:     Effort: Pulmonary effort is normal.     Breath sounds: Normal breath sounds.  Musculoskeletal:        General: Normal range of motion.     Cervical back: Normal range of motion and neck supple.  Skin:    General: Skin is warm and dry.  Neurological:     Mental Status: She is alert and oriented to person, place, and  time.  Psychiatric:        Mood and Affect: Mood normal.        Thought Content: Thought content normal.        Judgment: Judgment normal.     Results for orders placed or performed during the hospital encounter of 12/06/15  CBC with Differential  Result Value Ref Range   WBC 7.9 3.6 - 11.0 K/uL   RBC 4.67 3.80 - 5.20 MIL/uL   Hemoglobin 14.9 12.0 - 16.0 g/dL   HCT 44.0 10.2 - 72.5 %   MCV 92.0 80.0 - 100.0 fL   MCH 31.9 26.0 - 34.0 pg   MCHC 34.6 32.0 - 36.0 g/dL   RDW 36.6 44.0 - 34.7 %   Platelets 275 150 - 440 K/uL   Neutrophils Relative % 67 %   Neutro Abs 5.3 1.4 - 6.5 K/uL   Lymphocytes Relative 25 %   Lymphs Abs 2.0 1.0 - 3.6 K/uL   Monocytes Relative 6 %   Monocytes Absolute 0.5 0.2 - 0.9 K/uL   Eosinophils Relative 1 %   Eosinophils Absolute 0.1 0 - 0.7 K/uL   Basophils Relative 1 %   Basophils Absolute 0.1 0 - 0.1 K/uL  Comprehensive  metabolic panel  Result Value Ref Range   Sodium 137 135 - 145 mmol/L   Potassium 3.9 3.5 - 5.1 mmol/L   Chloride 108 101 - 111 mmol/L   CO2 21 (L) 22 - 32 mmol/L   Glucose, Bld 108 (H) 65 - 99 mg/dL   BUN 8 6 - 20 mg/dL   Creatinine, Ser 0.74 0.44 - 1.00 mg/dL   Calcium 9.2 8.9 - 10.3 mg/dL   Total Protein 8.0 6.5 - 8.1 g/dL   Albumin 4.5 3.5 - 5.0 g/dL   AST 26 15 - 41 U/L   ALT 21 14 - 54 U/L   Alkaline Phosphatase 92 38 - 126 U/L   Total Bilirubin 0.4 0.3 - 1.2 mg/dL   GFR calc non Af Amer >60 >60 mL/min   GFR calc Af Amer >60 >60 mL/min   Anion gap 8 5 - 15  Lipase, blood  Result Value Ref Range   Lipase 24 11 - 51 U/L      Assessment & Plan:   Problem List Items Addressed This Visit      Cardiovascular and Mediastinum   Migraines - Primary    Continues to be poorly controlled despite multiple therapies tried. Will refer to Neurology for further evaluation and in meantime increase to 100 mg BID topamax and 2 aimovig pens monthly. F/u in meantime if having issues or concerns      Relevant Medications    topiramate (TOPAMAX) 100 MG tablet   Erenumab-aooe (AIMOVIG) 70 MG/ML SOAJ   Other Relevant Orders   Ambulatory referral to Neurology       Follow up plan: Return in about 3 months (around 05/10/2020) for migraine f/u if not in with Neurology.

## 2020-02-13 NOTE — Assessment & Plan Note (Signed)
Continues to be poorly controlled despite multiple therapies tried. Will refer to Neurology for further evaluation and in meantime increase to 100 mg BID topamax and 2 aimovig pens monthly. F/u in meantime if having issues or concerns

## 2020-03-15 ENCOUNTER — Encounter: Payer: Self-pay | Admitting: Family Medicine

## 2020-03-26 ENCOUNTER — Encounter: Payer: Self-pay | Admitting: Family Medicine

## 2020-03-28 ENCOUNTER — Other Ambulatory Visit: Payer: Self-pay | Admitting: Family Medicine

## 2020-03-28 DIAGNOSIS — G43009 Migraine without aura, not intractable, without status migrainosus: Secondary | ICD-10-CM

## 2020-04-04 ENCOUNTER — Encounter: Payer: Self-pay | Admitting: Family Medicine

## 2020-04-05 ENCOUNTER — Encounter: Payer: Self-pay | Admitting: Family Medicine

## 2020-04-05 ENCOUNTER — Ambulatory Visit: Payer: Medicaid Other | Admitting: Family Medicine

## 2020-04-05 ENCOUNTER — Telehealth (INDEPENDENT_AMBULATORY_CARE_PROVIDER_SITE_OTHER): Payer: Medicaid Other | Admitting: Family Medicine

## 2020-04-05 VITALS — Wt 157.0 lb

## 2020-04-05 DIAGNOSIS — F4310 Post-traumatic stress disorder, unspecified: Secondary | ICD-10-CM

## 2020-04-05 DIAGNOSIS — F319 Bipolar disorder, unspecified: Secondary | ICD-10-CM | POA: Insufficient documentation

## 2020-04-05 HISTORY — DX: Post-traumatic stress disorder, unspecified: F43.10

## 2020-04-05 NOTE — Assessment & Plan Note (Signed)
Urgent referral placed to Psychiatry for evaluation and ongoing mgmt given severity of sxs. Resources given for local walk-in clinics, crisis hotlines and ER if having concern of harming self or others prior to this consultation. Declines new medications today

## 2020-04-05 NOTE — Assessment & Plan Note (Addendum)
Urgent referral placed to Psychiatry for evaluation and ongoing mgmt given severity of sxs. Resources given for local walk-in clinics, crisis hotlines and ER if having concern of harming self or others prior to this consultation. Declines new medications today 

## 2020-04-05 NOTE — Progress Notes (Signed)
Wt 157 lb (71.2 kg)    BMI 25.34 kg/m    Subjective:    Patient ID: Kim [name], female    DOB: 09/12/1966, 54 y.o.   MRN: [id number]  HPI: Kim [name] is a 54 y.o. female  Chief Complaint  Patient presents with   Follow-up    referral for couseling     This visit was completed via MyChart due to the restrictions of the COVID-19 pandemic. All issues as above were discussed and addressed. Physical exam was done as above through visual confirmation on MyChart. If it was felt that the patient should be evaluated in the office, they were directed there. The patient verbally consented to this visit.  Location of the patient: home  Location of the provider: work  Those involved with this call:   Provider: Roosvelt Maser, PA-C  CMA: Elton Sin, CMA  Front Desk/Registration: Adela Ports   Time spent on call: 15 minutes with patient face to face via video conference. More than 50% of this time was spent in counseling and coordination of care. 5 minutes total spent in review of patient's record and preparation of their chart. I verified patient identity using two factors (patient name and date of birth). Patient consents verbally to being seen via telemedicine visit today.   Presenting today with urgent concern for her severely depressed mood and PTSD/anxiety sxs. States she's always been followed by a Psychiatrist wherever she's lived but has not yet established with anyone here since moving back to town from [place]. Has declined medications since starting as a patient here and feels things are exacerbated significantly at this time. Denies active SI/HI but having agitation, shakiness, mood swings, depressed moods.   Refuses PHQ-9 and GAD-7 today.   Relevant past medical, surgical, family and social history reviewed and updated as indicated. Interim medical history since our last visit reviewed. Allergies and medications reviewed and updated.  Review of  Systems  Per HPI unless specifically indicated above     Objective:    Wt 157 lb (71.2 kg)    BMI 25.34 kg/m   Wt Readings from Last 3 Encounters:  04/05/20 157 lb (71.2 kg)  02/08/20 155 lb (70.3 kg)  12/08/19 160 lb (72.6 kg)    Physical Exam Vitals and nursing note reviewed.  Constitutional:      General: She is not in acute distress.    Appearance: Normal appearance.  HENT:     Head: Atraumatic.     Right Ear: External ear normal.     Left Ear: External ear normal.     Nose: Nose normal. No congestion.     Mouth/Throat:     Mouth: Mucous membranes are moist.     Pharynx: Oropharynx is clear. No posterior oropharyngeal erythema.  Eyes:     Extraocular Movements: Extraocular movements intact.     Conjunctiva/sclera: Conjunctivae normal.  Cardiovascular:     Comments: Unable to assess via virtual visit Pulmonary:     Effort: Pulmonary effort is normal. No respiratory distress.  Musculoskeletal:        General: Normal range of motion.     Cervical back: Normal range of motion.  Skin:    General: Skin is dry.     Findings: No erythema.  Neurological:     Mental Status: She is alert and oriented to person, place, and time.  Psychiatric:        Mood and Affect: Mood normal.  Thought Content: Thought content normal.        Judgment: Judgment normal.     Results for orders placed or performed during the hospital encounter of 12/06/15  CBC with Differential  Result Value Ref Range   WBC 7.9 3.6 - 11.0 K/uL   RBC 4.67 3.80 - 5.20 MIL/uL   Hemoglobin 14.9 12.0 - 16.0 g/dL   HCT 54.6 35 - 47 %   MCV 92.0 80.0 - 100.0 fL   MCH 31.9 26.0 - 34.0 pg   MCHC 34.6 32.0 - 36.0 g/dL   RDW 50.3 54.6 - 56.8 %   Platelets 275 150 - 440 K/uL   Neutrophils Relative % 67 %   Neutro Abs 5.3 1.4 - 6.5 K/uL   Lymphocytes Relative 25 %   Lymphs Abs 2.0 1.0 - 3.6 K/uL   Monocytes Relative 6 %   Monocytes Absolute 0.5 0 - 0 K/uL   Eosinophils Relative 1 %   Eosinophils  Absolute 0.1 0 - 0 K/uL   Basophils Relative 1 %   Basophils Absolute 0.1 0 - 0 K/uL  Comprehensive metabolic panel  Result Value Ref Range   Sodium 137 135 - 145 mmol/L   Potassium 3.9 3.5 - 5.1 mmol/L   Chloride 108 101 - 111 mmol/L   CO2 21 (L) 22 - 32 mmol/L   Glucose, Bld 108 (H) 65 - 99 mg/dL   BUN 8 6 - 20 mg/dL   Creatinine, Ser 1.27 0.44 - 1.00 mg/dL   Calcium 9.2 8.9 - 51.7 mg/dL   Total Protein 8.0 6.5 - 8.1 g/dL   Albumin 4.5 3.5 - 5.0 g/dL   AST 26 15 - 41 U/L   ALT 21 14 - 54 U/L   Alkaline Phosphatase 92 38 - 126 U/L   Total Bilirubin 0.4 0.3 - 1.2 mg/dL   GFR calc non Af Amer >60 >60 mL/min   GFR calc Af Amer >60 >60 mL/min   Anion gap 8 5 - 15  Lipase, blood  Result Value Ref Range   Lipase 24 11 - 51 U/L      Assessment & Plan:   Problem List Items Addressed This Visit      Other   Bipolar depression (HCC) - Primary    Urgent referral placed to Psychiatry for evaluation and ongoing mgmt given severity of sxs. Resources given for local walk-in clinics, crisis hotlines and ER if having concern of harming self or others prior to this consultation. Declines new medications today      Relevant Orders   Ambulatory referral to Psychiatry   PTSD (post-traumatic stress disorder)    Urgent referral placed to Psychiatry for evaluation and ongoing mgmt given severity of sxs. Resources given for local walk-in clinics, crisis hotlines and ER if having concern of harming self or others prior to this consultation. Declines new medications today      Relevant Orders   Ambulatory referral to Psychiatry       Follow up plan: Return for as scheduled.

## 2020-05-08 ENCOUNTER — Other Ambulatory Visit: Payer: Self-pay | Admitting: Family Medicine

## 2020-05-10 ENCOUNTER — Ambulatory Visit: Payer: Medicaid Other | Admitting: Family Medicine

## 2020-06-06 ENCOUNTER — Ambulatory Visit: Payer: Medicaid Other | Admitting: Unknown Physician Specialty

## 2020-06-13 DIAGNOSIS — F411 Generalized anxiety disorder: Secondary | ICD-10-CM | POA: Diagnosis not present

## 2020-06-13 DIAGNOSIS — Z79899 Other long term (current) drug therapy: Secondary | ICD-10-CM | POA: Diagnosis not present

## 2020-06-14 DIAGNOSIS — M5416 Radiculopathy, lumbar region: Secondary | ICD-10-CM | POA: Diagnosis not present

## 2020-06-16 ENCOUNTER — Other Ambulatory Visit: Payer: Self-pay | Admitting: Family Medicine

## 2020-06-16 NOTE — Telephone Encounter (Signed)
Requested medication (s) are due for refill today: yes  Requested medication (s) are on the active medication list: yes  Last refill:  11/04/19 #90 1 RF  Future visit scheduled: no  Notes to clinic:  med not delegated to NT to RF   Requested Prescriptions  Pending Prescriptions Disp Refills   ondansetron (ZOFRAN-ODT) 8 MG disintegrating tablet [Pharmacy Med Name: ONDANSETRON ODT 8MG  TABLETS] 90 tablet 1    Sig: DISSOLVE 1 TABLET(8 MG) ON THE TONGUE EVERY 8 HOURS AS NEEDED FOR NAUSEA OR VOMITING      Not Delegated - Gastroenterology: Antiemetics Failed - 06/16/2020  8:53 AM      Failed - This refill cannot be delegated      Passed - Valid encounter within last 6 months    Recent Outpatient Visits           2 months ago Bipolar depression [facility])   [facility] [place], [place], PA-C   4 months ago Migraine without aura and without status migrainosus, not intractable   [facility] [place], [place], [place]   6 months ago Migraine without aura and without status migrainosus, not intractable   [facility], [place], [place]   7 months ago Chronic right-sided low back pain with left-sided sciatica   [facility], [place]   4 years ago Migraine without aura and without status migrainosus, not intractable   [facility] [place], Stella, DO

## 2020-06-22 DIAGNOSIS — M5416 Radiculopathy, lumbar region: Secondary | ICD-10-CM | POA: Diagnosis not present

## 2020-06-22 DIAGNOSIS — Z13820 Encounter for screening for osteoporosis: Secondary | ICD-10-CM

## 2020-06-22 NOTE — Telephone Encounter (Signed)
Please Advise.  KP

## 2020-06-27 ENCOUNTER — Ambulatory Visit
Admission: RE | Admit: 2020-06-27 | Discharge: 2020-06-27 | Disposition: A | Discharge status: Home / Self Care | Payer: Medicaid Other | Source: Ambulatory Visit | Attending: Nurse Practitioner | Admitting: Nurse Practitioner

## 2020-06-27 ENCOUNTER — Other Ambulatory Visit: Payer: Self-pay

## 2020-06-27 DIAGNOSIS — Z13820 Encounter for screening for osteoporosis: Secondary | ICD-10-CM | POA: Diagnosis present

## 2020-06-28 ENCOUNTER — Encounter: Payer: Self-pay | Admitting: Family Medicine

## 2020-06-28 ENCOUNTER — Ambulatory Visit
Admission: RE | Admit: 2020-06-28 | Discharge: 2020-06-28 | Disposition: A | Discharge status: Home / Self Care | Payer: Medicaid Other | Attending: Family Medicine | Admitting: Family Medicine

## 2020-06-28 ENCOUNTER — Ambulatory Visit (INDEPENDENT_AMBULATORY_CARE_PROVIDER_SITE_OTHER): Payer: Medicaid Other | Admitting: Family Medicine

## 2020-06-28 ENCOUNTER — Ambulatory Visit
Admission: RE | Admit: 2020-06-28 | Discharge: 2020-06-28 | Disposition: A | Discharge status: Home / Self Care | Payer: Medicaid Other | Source: Ambulatory Visit | Attending: Family Medicine | Admitting: Family Medicine

## 2020-06-28 VITALS — BP 118/81 | HR 72 | Temp 98.4°F | Ht 66.0 in | Wt 162.0 lb

## 2020-06-28 DIAGNOSIS — M79622 Pain in left upper arm: Secondary | ICD-10-CM | POA: Diagnosis not present

## 2020-06-28 DIAGNOSIS — M25512 Pain in left shoulder: Secondary | ICD-10-CM | POA: Diagnosis not present

## 2020-06-28 DIAGNOSIS — M542 Cervicalgia: Secondary | ICD-10-CM | POA: Diagnosis not present

## 2020-06-28 MED ORDER — GABAPENTIN 300 MG PO CAPS: 300.0000 mg | ORAL_CAPSULE | Freq: Every day | ORAL | 1 refills | Status: DC

## 2020-06-28 NOTE — Progress Notes (Signed)
BP 118/81 (BP Location: Left Arm, Patient Position: Sitting)   Pulse 72   Temp 98.4 F (36.9 C) (Oral)   Ht 5\' 6"  (1.676 m)   Wt 162 lb (73.5 kg)   SpO2 99%   BMI 26.15 kg/m    Subjective:    Patient ID: , female    DOB: 07-21-1966, 54 y.o.   MRN: [id number]  HPI: Kim [name] is a 54 y.o. female  Chief Complaint  Patient presents with  . Shoulder Pain    X3-4 days, pain under arm and shoulder blade, pain shoots down middle finger, pain comes and goes    SHOULDER PAIN Duration: 3-4 days Involved shoulder: left Mechanism of injury:  Location: anterior- under L pec and into her shoulder blade Onset:sudden Severity: 3/10   Quality:  Irritating ache Frequency: intermittent Radiation: down to her middle finger on the L side Aggravating factors: lifting, movement, sleep and throwing  Alleviating factors: BC powder, tramadol, muscle relaxers   Status: fluctuating Treatments attempted: BC powder, tramadol, muscle relaxers     Relief with NSAIDs?:  No NSAIDs Taken Weakness: yes Numbness: yes Decreased grip strength: yes Redness: no Swelling: no Bruising: no Fevers: no  Relevant past medical, surgical, family and social history reviewed and updated as indicated. Interim medical history since our last visit reviewed. Allergies and medications reviewed and updated.  Review of Systems  Constitutional: Negative.   Respiratory: Negative.   Cardiovascular: Negative.   Gastrointestinal: Negative.   Musculoskeletal: Positive for myalgias, neck pain and neck stiffness. Negative for arthralgias, back pain, gait problem and joint swelling.  Neurological: Positive for weakness and numbness. Negative for dizziness, tremors, seizures, syncope, facial asymmetry, speech difficulty, light-headedness and headaches.  Psychiatric/Behavioral: Negative.     Per HPI unless specifically indicated above     Objective:    BP 118/81 (BP Location: Left Arm,  Patient Position: Sitting)   Pulse 72   Temp 98.4 F (36.9 C) (Oral)   Ht 5\' 6"  (1.676 m)   Wt 162 lb (73.5 kg)   SpO2 99%   BMI 26.15 kg/m   Wt Readings from Last 3 Encounters:  06/28/20 162 lb (73.5 kg)  04/05/20 157 lb (71.2 kg)  02/08/20 155 lb (70.3 kg)    Physical Exam Vitals and nursing note reviewed.  Constitutional:      General: She is not in acute distress.    Appearance: Normal appearance. She is not ill-appearing, toxic-appearing or diaphoretic.  HENT:     Head: Normocephalic and atraumatic.     Right Ear: External ear normal.     Left Ear: External ear normal.     Nose: Nose normal.     Mouth/Throat:     Mouth: Mucous membranes are moist.     Pharynx: Oropharynx is clear.  Eyes:     General: No scleral icterus.       Right eye: No discharge.        Left eye: No discharge.     Extraocular Movements: Extraocular movements intact.     Conjunctiva/sclera: Conjunctivae normal.     Pupils: Pupils are equal, round, and reactive to light.  Cardiovascular:     Rate and Rhythm: Normal rate and regular rhythm.     Pulses: Normal pulses.     Heart sounds: Normal heart sounds. No murmur heard.  No friction rub. No gallop.   Pulmonary:     Effort: Pulmonary effort is normal. No respiratory distress.  Breath sounds: Normal breath sounds. No stridor. No wheezing, rhonchi or rales.  Chest:     Chest wall: No tenderness.  Musculoskeletal:        General: Tenderness (neck) present.     Cervical back: Normal range of motion and neck supple.  Skin:    General: Skin is warm and dry.     Capillary Refill: Capillary refill takes less than 2 seconds.     Coloration: Skin is not jaundiced or pale.     Findings: No bruising, erythema, lesion or rash.  Neurological:     General: No focal deficit present.     Mental Status: She is alert and oriented to person, place, and time. Mental status is at baseline.  Psychiatric:        Mood and Affect: Mood normal.         Behavior: Behavior normal.        Thought Content: Thought content normal.        Judgment: Judgment normal.     Results for orders placed or performed during the hospital encounter of 12/06/15  CBC with Differential  Result Value Ref Range   WBC 7.9 3.6 - 11.0 K/uL   RBC 4.67 3.80 - 5.20 MIL/uL   Hemoglobin 14.9 12.0 - 16.0 g/dL   HCT 31.4 35 - 47 %   MCV 92.0 80.0 - 100.0 fL   MCH 31.9 26.0 - 34.0 pg   MCHC 34.6 32.0 - 36.0 g/dL   RDW 97.0 26.3 - 78.5 %   Platelets 275 150 - 440 K/uL   Neutrophils Relative % 67 %   Neutro Abs 5.3 1.4 - 6.5 K/uL   Lymphocytes Relative 25 %   Lymphs Abs 2.0 1.0 - 3.6 K/uL   Monocytes Relative 6 %   Monocytes Absolute 0.5 0.2 - 0.9 K/uL   Eosinophils Relative 1 %   Eosinophils Absolute 0.1 0.0 - 0.7 K/uL   Basophils Relative 1 %   Basophils Absolute 0.1 0.0 - 0.1 K/uL  Comprehensive metabolic panel  Result Value Ref Range   Sodium 137 135 - 145 mmol/L   Potassium 3.9 3.5 - 5.1 mmol/L   Chloride 108 101 - 111 mmol/L   CO2 21 (L) 22 - 32 mmol/L   Glucose, Bld 108 (H) 65 - 99 mg/dL   BUN 8 6 - 20 mg/dL   Creatinine, Ser 8.85 0.44 - 1.00 mg/dL   Calcium 9.2 8.9 - 02.7 mg/dL   Total Protein 8.0 6.5 - 8.1 g/dL   Albumin 4.5 3.5 - 5.0 g/dL   AST 26 15 - 41 U/L   ALT 21 14 - 54 U/L   Alkaline Phosphatase 92 38 - 126 U/L   Total Bilirubin 0.4 0.3 - 1.2 mg/dL   GFR calc non Af Amer >60 >60 mL/min   GFR calc Af Amer >60 >60 mL/min   Anion gap 8 5 - 15  Lipase, blood  Result Value Ref Range   Lipase 24 11 - 51 U/L      Assessment & Plan:   Problem List Items Addressed This Visit    None    Visit Diagnoses    Pain in left axilla    -  Primary   EKG normal. Concern for cervical radiculopathy. Will start gabapentin and obtain x-rays. Await results. Treat as needed.    Relevant Orders   EKG 12-Lead (Completed)   DG Shoulder Left   DG Cervical Spine Complete  Follow up plan: Return in about 4 weeks (around  07/26/2020).

## 2020-06-29 ENCOUNTER — Encounter: Payer: Self-pay | Admitting: Family Medicine

## 2020-06-30 ENCOUNTER — Encounter: Payer: Self-pay | Admitting: Family Medicine

## 2020-07-02 NOTE — Progress Notes (Signed)
Interpreted by me on 06/28/20- NSR at 71bpm with no ST segment changes

## 2020-07-05 ENCOUNTER — Encounter: Payer: Self-pay | Admitting: Family Medicine

## 2020-07-10 ENCOUNTER — Other Ambulatory Visit: Payer: Self-pay

## 2020-07-11 NOTE — Telephone Encounter (Signed)
PA done today. 

## 2020-07-18 ENCOUNTER — Other Ambulatory Visit: Payer: Self-pay | Admitting: Nurse Practitioner

## 2020-07-18 NOTE — Telephone Encounter (Signed)
Requested medication (s) are due for refill today: no  Requested medication (s) are on the active medication list: yes   Last refill:  06/18/20 #90 0 refills   Future visit scheduled: yes in 1 week  Notes to clinic:  not delegated per protocol     Requested Prescriptions  Pending Prescriptions Disp Refills   ondansetron (ZOFRAN-ODT) 8 MG disintegrating tablet [Pharmacy Med Name: ONDANSETRON ODT 8MG  TABLETS] 90 tablet 0    Sig: DISSOLVE 1 TABLET(8 MG) ON THE TONGUE EVERY 8 HOURS AS NEEDED FOR NAUSEA OR VOMITING      Not Delegated - Gastroenterology: Antiemetics Failed - 07/18/2020  3:59 PM      Failed - This refill cannot be delegated      Passed - Valid encounter within last 6 months    Recent Outpatient Visits           2 weeks ago Pain in left axilla   [facility] [place], Megan P, DO   3 months ago Bipolar depression [facility])   [facility] [place], [place], [place]   5 months ago Migraine without aura and without status migrainosus, not intractable   [facility], [place], [place]   7 months ago Migraine without aura and without status migrainosus, not intractable   [facility], [place], [place]   8 months ago Chronic right-sided low back pain with left-sided sciatica   [facility], [facility], [place] Hews       Future Appointments             In 1 week [place], [place], NP Dorie Rank, [facility]

## 2020-07-23 ENCOUNTER — Encounter: Payer: Self-pay | Admitting: Family Medicine

## 2020-07-23 DIAGNOSIS — G4700 Insomnia, unspecified: Secondary | ICD-10-CM | POA: Diagnosis not present

## 2020-07-23 DIAGNOSIS — F4312 Post-traumatic stress disorder, chronic: Secondary | ICD-10-CM | POA: Diagnosis not present

## 2020-07-23 DIAGNOSIS — F314 Bipolar disorder, current episode depressed, severe, without psychotic features: Secondary | ICD-10-CM | POA: Diagnosis not present

## 2020-07-30 ENCOUNTER — Ambulatory Visit: Payer: Medicaid Other | Admitting: Nurse Practitioner

## 2020-07-30 ENCOUNTER — Encounter: Payer: Self-pay | Admitting: Family Medicine

## 2020-08-01 ENCOUNTER — Encounter: Payer: Self-pay | Admitting: Family Medicine

## 2020-08-01 ENCOUNTER — Other Ambulatory Visit: Payer: Self-pay | Admitting: Family Medicine

## 2020-08-01 ENCOUNTER — Telehealth (INDEPENDENT_AMBULATORY_CARE_PROVIDER_SITE_OTHER): Payer: Medicaid Other | Admitting: Family Medicine

## 2020-08-01 DIAGNOSIS — M5416 Radiculopathy, lumbar region: Secondary | ICD-10-CM

## 2020-08-01 DIAGNOSIS — S22080S Wedge compression fracture of T11-T12 vertebra, sequela: Secondary | ICD-10-CM | POA: Insufficient documentation

## 2020-08-01 DIAGNOSIS — G2581 Restless legs syndrome: Secondary | ICD-10-CM

## 2020-08-01 DIAGNOSIS — M5412 Radiculopathy, cervical region: Secondary | ICD-10-CM

## 2020-08-01 DIAGNOSIS — Z1231 Encounter for screening mammogram for malignant neoplasm of breast: Secondary | ICD-10-CM

## 2020-08-01 DIAGNOSIS — Z13820 Encounter for screening for osteoporosis: Secondary | ICD-10-CM

## 2020-08-01 DIAGNOSIS — S22080G Wedge compression fracture of T11-T12 vertebra, subsequent encounter for fracture with delayed healing: Secondary | ICD-10-CM

## 2020-08-01 DIAGNOSIS — S22080A Wedge compression fracture of T11-T12 vertebra, initial encounter for closed fracture: Secondary | ICD-10-CM | POA: Insufficient documentation

## 2020-08-01 HISTORY — DX: Radiculopathy, cervical region: M54.12

## 2020-08-01 HISTORY — DX: Radiculopathy, lumbar region: M54.16

## 2020-08-01 HISTORY — DX: Wedge compression fracture of t11-T12 vertebra, sequela: S22.080S

## 2020-08-01 MED ORDER — PREGABALIN 75 MG PO CAPS: ORAL_CAPSULE | ORAL | 1 refills | Status: DC

## 2020-08-01 NOTE — Progress Notes (Signed)
There were no vitals taken for this visit.   Subjective:    Patient ID: Kim [name], female    DOB: 1966-08-31, 54 y.o.   MRN: [id number]  HPI: Emsley Custer Spickler is a 54 y.o. female  Chief Complaint  Patient presents with  . Back Pain    patient states bsck pain is getting worse  . Med review    Requip, patient states that she is taking more than prescribed for her left leg   Pain under her arm is gone. Feeling much better with that. She did not feel well on the gabapentin- she felt very loopy on the medicine and fell 2x getting out of bed. She stopped them. She is unsure if she had any benefit from it.   Darlean has been having issues with her back in February of 2021. Has been having pain in her mid back and in her low back. [facility] has [facility] since that time and has had x-rays and an MRI. She has a T12 compression fracture. She has had an epidural steroid injection that gave her 2 days of relief. She was advised to go to [facility] for a fusion of the compression fracture, but did not want to go to [facility]. They have discussed a fusion with her if not better with the injections. She was unclear if she had a follow up with them, but it looks like she is supposed to be seeing them this month to discuss other options. Today, she stated that they had told her there was nothing else they could do for her and she was very frustrated. She notes that the majority of her pain is coming around her hip. Pain has been significantly worse for about a month. It started after picking up a carpet. It is worse with movement and better with rest. She cannot take gabapentin, cymbalta or nortriptyline. She is unsure if she's taken lyrica in the past. She notes that her leg has gotten singificantly worse and she is taking a lot of requip- more than has been prescribed. She did not follow up with neurology in August for unknown reasons.   Relevant past medical, surgical, family and  social history reviewed and updated as indicated. Interim medical history since our last visit reviewed. Allergies and medications reviewed and updated.  Review of Systems  Constitutional: Negative.   Respiratory: Negative.   Cardiovascular: Negative.   Genitourinary: Negative.   Musculoskeletal: Positive for back pain, gait problem, myalgias, neck pain and neck stiffness. Negative for arthralgias and joint swelling.  Neurological: Positive for numbness. Negative for dizziness, tremors, seizures, syncope, facial asymmetry, speech difficulty, weakness, light-headedness and headaches.  Psychiatric/Behavioral: Negative.     Per HPI unless specifically indicated above     Objective:    There were no vitals taken for this visit.  Wt Readings from Last 3 Encounters:  06/28/20 162 lb (73.5 kg)  04/05/20 157 lb (71.2 kg)  02/08/20 155 lb (70.3 kg)    Physical Exam Vitals and nursing note reviewed.  Constitutional:      General: She is not in acute distress.    Appearance: Normal appearance. She is not ill-appearing, toxic-appearing or diaphoretic.  HENT:     Head: Normocephalic and atraumatic.     Right Ear: External ear normal.     Left Ear: External ear normal.     Nose: Nose normal.     Mouth/Throat:     Mouth: Mucous membranes are moist.  Pharynx: Oropharynx is clear.  Eyes:     General: No scleral icterus.       Right eye: No discharge.        Left eye: No discharge.     Conjunctiva/sclera: Conjunctivae normal.     Pupils: Pupils are equal, round, and reactive to light.  Pulmonary:     Effort: Pulmonary effort is normal. No respiratory distress.     Comments: Speaking in full sentences Musculoskeletal:        General: Normal range of motion.     Cervical back: Normal range of motion.  Skin:    Coloration: Skin is not jaundiced or pale.     Findings: No bruising, erythema, lesion or rash.  Neurological:     Mental Status: She is alert and oriented to person, place,  and time. Mental status is at baseline.  Psychiatric:        Mood and Affect: Mood normal.        Behavior: Behavior normal.        Thought Content: Thought content normal.        Judgment: Judgment normal.     Results for orders placed or performed during the hospital encounter of 12/06/15  CBC with Differential  Result Value Ref Range   WBC 7.9 3.6 - 11.0 K/uL   RBC 4.67 3.80 - 5.20 MIL/uL   Hemoglobin 14.9 12.0 - 16.0 g/dL   HCT 29.7 35 - 47 %   MCV 92.0 80.0 - 100.0 fL   MCH 31.9 26.0 - 34.0 pg   MCHC 34.6 32.0 - 36.0 g/dL   RDW 98.9 21.1 - 94.1 %   Platelets 275 150 - 440 K/uL   Neutrophils Relative % 67 %   Neutro Abs 5.3 1.4 - 6.5 K/uL   Lymphocytes Relative 25 %   Lymphs Abs 2.0 1.0 - 3.6 K/uL   Monocytes Relative 6 %   Monocytes Absolute 0.5 0.2 - 0.9 K/uL   Eosinophils Relative 1 %   Eosinophils Absolute 0.1 0.0 - 0.7 K/uL   Basophils Relative 1 %   Basophils Absolute 0.1 0.0 - 0.1 K/uL  Comprehensive metabolic panel  Result Value Ref Range   Sodium 137 135 - 145 mmol/L   Potassium 3.9 3.5 - 5.1 mmol/L   Chloride 108 101 - 111 mmol/L   CO2 21 (L) 22 - 32 mmol/L   Glucose, Bld 108 (H) 65 - 99 mg/dL   BUN 8 6 - 20 mg/dL   Creatinine, Ser 7.40 0.44 - 1.00 mg/dL   Calcium 9.2 8.9 - 81.4 mg/dL   Total Protein 8.0 6.5 - 8.1 g/dL   Albumin 4.5 3.5 - 5.0 g/dL   AST 26 15 - 41 U/L   ALT 21 14 - 54 U/L   Alkaline Phosphatase 92 38 - 126 U/L   Total Bilirubin 0.4 0.3 - 1.2 mg/dL   GFR calc non Af Amer >60 >60 mL/min   GFR calc Af Amer >60 >60 mL/min   Anion gap 8 5 - 15  Lipase, blood  Result Value Ref Range   Lipase 24 11 - 51 U/L      Assessment & Plan:   Problem List Items Addressed This Visit      Nervous and Auditory   Lumbar radiculopathy    Has been following with ortho. Due to follow back up with them this month. Will get her started on lyrica. Follow up with ortho. Referral to pain management placed today. Referral  back to neurology made today.  Call with any concerns.       Relevant Medications   VRAYLAR capsule   pregabalin (LYRICA) 75 MG capsule   Other Relevant Orders   Ambulatory referral to Pain Clinic   Cervical radiculopathy    Has been following with ortho. Due to follow back up with them this month. Will get her started on lyrica. Follow up with ortho. Referral to pain management placed today. Referral back to neurology made today. Call with any concerns.       Relevant Medications   VRAYLAR capsule   pregabalin (LYRICA) 75 MG capsule   Other Relevant Orders   Ambulatory referral to Pain Clinic     Musculoskeletal and Integument   Compression fracture of T12 vertebra (HCC)    Following with ortho. Needs DEXA. Ordered today.       Relevant Orders   DG Bone Density     Other   RLS (restless legs syndrome) - Primary    Taking a significant amount of requip. Concern her RLS is actually radicular pain. Will get her back into neurology for evaluation. Starting on lyrica with slow titration to try to mitigate side effects. Recheck 3-4 weeks.       Relevant Orders   Ambulatory referral to Neurology    Other Visit Diagnoses    Screening for osteoporosis       Will try to get set up with mobile DEXA next week when it's here.   Relevant Orders   DG Bone Density   Encounter for screening mammogram for malignant neoplasm of breast       Will try to get set up with mobile DEXA next week when it's here.   Relevant Orders   MM 3D SCREEN BREAST BILATERAL       Follow up plan: Return in about 3 weeks (around 08/22/2020).   . This visit was completed via MyChart due to the restrictions of the COVID-19 pandemic. All issues as above were discussed and addressed. Physical exam was done as above through visual confirmation on MyChart. If it was felt that the patient should be evaluated in the office, they were directed there. The patient verbally consented to this visit. . Location of the patient: home . Location of the  provider: work . Those involved with this call:  . Provider: Olevia Perches, DO . CMA: Tristan Schroeder, CMA . Front Desk/Registration: Harriet Pho  . Time spent on call: 25 minutes with patient face to face via video conference. More than 50% of this time was spent in counseling and coordination of care. 40 minutes total spent in review of patient's record and preparation of their chart.

## 2020-08-01 NOTE — Assessment & Plan Note (Signed)
Has been following with ortho. Due to follow back up with them this month. Will get her started on lyrica. Follow up with ortho. Referral to pain management placed today. Referral back to neurology made today. Call with any concerns.

## 2020-08-01 NOTE — Assessment & Plan Note (Signed)
Has been following with ortho. Due to follow back up with them this month. Will get her started on lyrica. Follow up with ortho. Referral to pain management placed today. Referral back to neurology made today. Call with any concerns.  

## 2020-08-01 NOTE — Assessment & Plan Note (Signed)
Following with ortho. Needs DEXA. Ordered today.

## 2020-08-01 NOTE — Assessment & Plan Note (Signed)
Taking a significant amount of requip. Concern her RLS is actually radicular pain. Will get her back into neurology for evaluation. Starting on lyrica with slow titration to try to mitigate side effects. Recheck 3-4 weeks.

## 2020-08-07 ENCOUNTER — Encounter: Payer: Self-pay | Admitting: Family Medicine

## 2020-08-09 ENCOUNTER — Other Ambulatory Visit: Payer: Self-pay | Admitting: Family Medicine

## 2020-08-13 NOTE — Telephone Encounter (Signed)
Please call patient and see if she would like to be seen before the 10th.

## 2020-08-13 NOTE — Telephone Encounter (Signed)
Does this patient need an appt before the 10th?

## 2020-08-14 ENCOUNTER — Ambulatory Visit: Payer: Medicaid Other

## 2020-08-22 ENCOUNTER — Other Ambulatory Visit: Payer: Self-pay | Admitting: Nurse Practitioner

## 2020-08-22 NOTE — Telephone Encounter (Signed)
Refused by: Reel, Tiffany L, CMA    Refusal reason: Refill not appropriate   

## 2020-08-22 NOTE — Telephone Encounter (Signed)
Requested medication (s) are due for refill today:   Provider to decide  Requested medication (s) are on the active medication list:   Yes  Future visit scheduled:   No    Last ordered: 07/18/2020 #90, 0 refills  Non delegated refill   Requested Prescriptions  Pending Prescriptions Disp Refills   ondansetron (ZOFRAN-ODT) 8 MG disintegrating tablet [Pharmacy Med Name: ONDANSETRON ODT 8MG  TABLETS] 90 tablet 0    Sig: DISSOLVE 1 TABLET(8 MG) ON THE TONGUE EVERY 8 HOURS AS NEEDED FOR NAUSEA OR VOMITING      Not Delegated - Gastroenterology: Antiemetics Failed - 08/22/2020  1:02 PM      Failed - This refill cannot be delegated      Passed - Valid encounter within last 6 months    Recent Outpatient Visits           3 weeks ago RLS (restless legs syndrome)   [facility] [place], Megan P, DO   1 month ago Pain in left axilla   [facility] [place], Megan P, DO   4 months ago Bipolar depression [facility])   [facility] [place], [place], [place]   6 months ago Migraine without aura and without status migrainosus, not intractable   [facility], [place], [place]   8 months ago Migraine without aura and without status migrainosus, not intractable   [facility] [place], [place], [place]

## 2020-08-23 NOTE — Telephone Encounter (Signed)
Lvm  Refused by: Reel, Tiffany L, CMA     Refusal reason: Refill not appropriate

## 2020-08-24 ENCOUNTER — Telehealth: Payer: Medicaid Other | Admitting: Family Medicine

## 2020-08-24 ENCOUNTER — Encounter: Payer: Self-pay | Admitting: Family Medicine

## 2020-08-27 NOTE — Telephone Encounter (Signed)
Pt would like to speak with some one in clinical staff to specify what  refill not appropriate means. Pt did state she did not understand why she would need a apt to get a refill  Refused by: Reel, Tiffany L, CMA    Refusal reason: Refill not appropriate

## 2020-09-05 ENCOUNTER — Encounter: Payer: Self-pay | Admitting: Family Medicine

## 2020-09-06 ENCOUNTER — Encounter: Payer: Self-pay | Admitting: Unknown Physician Specialty

## 2020-09-06 ENCOUNTER — Ambulatory Visit
Admission: RE | Admit: 2020-09-06 | Discharge: 2020-09-06 | Disposition: A | Discharge status: Home / Self Care | Payer: Medicaid Other | Source: Ambulatory Visit | Attending: Unknown Physician Specialty | Admitting: Unknown Physician Specialty

## 2020-09-06 ENCOUNTER — Ambulatory Visit
Admission: RE | Admit: 2020-09-06 | Discharge: 2020-09-06 | Disposition: A | Discharge status: Home / Self Care | Payer: Medicaid Other | Attending: Unknown Physician Specialty | Admitting: Unknown Physician Specialty

## 2020-09-06 ENCOUNTER — Ambulatory Visit (INDEPENDENT_AMBULATORY_CARE_PROVIDER_SITE_OTHER): Payer: Medicaid Other | Admitting: Unknown Physician Specialty

## 2020-09-06 ENCOUNTER — Other Ambulatory Visit: Payer: Self-pay

## 2020-09-06 VITALS — BP 127/74 | HR 77 | Temp 98.0°F | Wt 185.4 lb

## 2020-09-06 DIAGNOSIS — R0781 Pleurodynia: Secondary | ICD-10-CM | POA: Insufficient documentation

## 2020-09-06 MED ORDER — HYDROCODONE-ACETAMINOPHEN 5-325 MG PO TABS
1.0000 | ORAL_TABLET | Freq: Four times a day (QID) | ORAL | 0 refills | Status: DC | PRN
Start: 2020-09-06 — End: 2020-10-08

## 2020-09-06 NOTE — Progress Notes (Signed)
BP 127/74   Pulse 77   Temp 98 F (36.7 C) (Oral)   Wt 185 lb 6.4 oz (84.1 kg)   SpO2 98%   BMI 29.92 kg/m    Subjective:    Patient ID: Kim [name], female    DOB: [birth year], 54 y.o.   MRN: [id number]  HPI: Kim [name] is a 54 y.o. female  Chief Complaint  Patient presents with  . Back Pain    Pt states she had a fall on Monday and has R side back pain since. States the pain is about right in the middle on the R side. States it hurts to breath in and out, has tried heat and BC powder for pain management   Pt states she slippped on the ice and fell sideways against the doorway.  States it hurts to breath in and out.  She only takes BC powder.  Receives Gabapentin but she never takes it. She does have underlying osteopenia on chart review.  Needs bone density.  She is seeing [facility] and had been given Tramadol and muscle relaxants.  That has not been refilled.  Reviewed pmp aware, Last Tramadol was 9/30  Patient Active Problem List   Diagnosis Date Noted  . Lumbar radiculopathy 08/01/2020  . Cervical radiculopathy 08/01/2020  . Compression fracture of T12 vertebra (HCC) 08/01/2020  . Bipolar depression (HCC) 04/05/2020  . PTSD (post-traumatic stress disorder) 04/05/2020  . GERD (gastroesophageal reflux disease)   . COPD (chronic obstructive pulmonary disease) (HCC) 02/21/2016  . Chronic abdominal pain 11/09/2015  . RLS (restless legs syndrome) 07/02/2015  . Migraines 07/02/2015  . Cigarette smoker 06/25/2011     Relevant past medical, surgical, family and social history reviewed and updated as indicated. Interim medical history since our last visit reviewed. Allergies and medications reviewed and updated.  Review of Systems  Constitutional: Positive for activity change. Negative for chills, diaphoresis and fever.  Respiratory: Negative for shortness of breath.   Cardiovascular: Negative for palpitations.  Psychiatric/Behavioral: Negative for  agitation and decreased concentration. The patient is not nervous/anxious.     Per HPI unless specifically indicated above     Objective:    BP 127/74   Pulse 77   Temp 98 F (36.7 C) (Oral)   Wt 185 lb 6.4 oz (84.1 kg)   SpO2 98%   BMI 29.92 kg/m   Wt Readings from Last 3 Encounters:  09/06/20 185 lb 6.4 oz (84.1 kg)  06/28/20 162 lb (73.5 kg)  04/05/20 157 lb (71.2 kg)    Physical Exam Constitutional:      General: She is not in acute distress.    Appearance: She is well-developed and well-nourished.     Comments: In pain  HENT:     Head: Normocephalic and atraumatic.  Eyes:     General: Lids are normal. No scleral icterus.       Right eye: No discharge.        Left eye: No discharge.     Conjunctiva/sclera: Conjunctivae normal.  Neck:     Vascular: No carotid bruit or JVD.  Cardiovascular:     Rate and Rhythm: Normal rate and regular rhythm.     Heart sounds: Normal heart sounds.  Pulmonary:     Effort: Pulmonary effort is normal.     Breath sounds: Normal breath sounds.  Abdominal:     Palpations: There is no hepatomegaly or splenomegaly.  Musculoskeletal:     Cervical back: Normal range of  motion and neck supple.     Comments: Tender right lower rib cage from back to front.    Skin:    General: Skin is warm, dry and intact.     Coloration: Skin is not pale.     Findings: No rash.  Neurological:     Mental Status: She is alert and oriented to person, place, and time.  Psychiatric:        Mood and Affect: Mood and affect normal.        Behavior: Behavior normal.        Thought Content: Thought content normal.        Judgment: Judgment normal.      Assessment & Plan:   Problem List Items Addressed This Visit   None   Visit Diagnoses    Rib pain on right side    -  Primary   susp. fracture as she landed directly on area and exquisitely tender.[place] written after pmp review for #20. No refills. Pt aware limited rx.  Lidoderm patc   Relevant Orders    DG Chest 2 View   DG Ribs Unilateral Right       Follow up plan: Return if symptoms worsen or fail to improve.
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
## 2020-09-17 ENCOUNTER — Telehealth: Payer: Medicaid Other | Admitting: Family Medicine

## 2020-09-20 DIAGNOSIS — G4440 Drug-induced headache, not elsewhere classified, not intractable: Secondary | ICD-10-CM | POA: Insufficient documentation

## 2020-09-20 DIAGNOSIS — R519 Headache, unspecified: Secondary | ICD-10-CM | POA: Insufficient documentation

## 2020-09-20 HISTORY — DX: Headache, unspecified: R51.9

## 2020-09-21 ENCOUNTER — Other Ambulatory Visit: Payer: Self-pay

## 2020-09-21 MED ORDER — ROPINIROLE HCL 4 MG PO TABS: 4.0000 mg | ORAL_TABLET | Freq: Every day | ORAL | 0 refills | Status: DC

## 2020-09-22 ENCOUNTER — Other Ambulatory Visit: Payer: Self-pay | Admitting: Nurse Practitioner

## 2020-09-24 NOTE — Telephone Encounter (Signed)
Patient notified

## 2020-09-24 NOTE — Telephone Encounter (Signed)
Please get patient scheduled with new provider

## 2020-09-24 NOTE — Telephone Encounter (Signed)
Please see message from patient

## 2020-09-25 DIAGNOSIS — R32 Unspecified urinary incontinence: Secondary | ICD-10-CM | POA: Diagnosis not present

## 2020-09-25 DIAGNOSIS — S22000A Wedge compression fracture of unspecified thoracic vertebra, initial encounter for closed fracture: Secondary | ICD-10-CM | POA: Diagnosis not present

## 2020-10-01 ENCOUNTER — Encounter: Payer: Self-pay | Admitting: Student in an Organized Health Care Education/Training Program

## 2020-10-01 ENCOUNTER — Encounter: Payer: Self-pay | Admitting: Family Medicine

## 2020-10-02 ENCOUNTER — Ambulatory Visit: Payer: Medicaid Other | Admitting: Family Medicine

## 2020-10-04 ENCOUNTER — Ambulatory Visit: Payer: Medicaid Other | Admitting: Student in an Organized Health Care Education/Training Program

## 2020-10-05 ENCOUNTER — Telehealth: Payer: Self-pay | Admitting: *Deleted

## 2020-10-05 NOTE — Telephone Encounter (Signed)
Received referral for low dose lung cancer screening CT scan. Message left at phone number listed in EMR for patient to call me back to facilitate scheduling scan.  

## 2020-10-08 ENCOUNTER — Encounter: Payer: Self-pay | Admitting: Family Medicine

## 2020-10-08 ENCOUNTER — Other Ambulatory Visit: Payer: Self-pay

## 2020-10-08 ENCOUNTER — Ambulatory Visit: Admission: RE | Admit: 2020-10-08 | Payer: Medicaid Other | Source: Home / Self Care | Admitting: *Deleted

## 2020-10-08 ENCOUNTER — Ambulatory Visit (INDEPENDENT_AMBULATORY_CARE_PROVIDER_SITE_OTHER): Payer: Medicaid Other | Admitting: Family Medicine

## 2020-10-08 ENCOUNTER — Ambulatory Visit
Admission: RE | Admit: 2020-10-08 | Discharge: 2020-10-08 | Disposition: A | Discharge status: Home / Self Care | Payer: Medicaid Other | Attending: Family Medicine | Admitting: Family Medicine

## 2020-10-08 ENCOUNTER — Ambulatory Visit
Admission: RE | Admit: 2020-10-08 | Discharge: 2020-10-08 | Disposition: A | Discharge status: Home / Self Care | Payer: Medicaid Other | Source: Ambulatory Visit | Attending: Family Medicine | Admitting: Family Medicine

## 2020-10-08 VITALS — BP 130/80 | HR 59 | Temp 97.6°F | Wt 190.6 lb

## 2020-10-08 DIAGNOSIS — J9811 Atelectasis: Secondary | ICD-10-CM | POA: Diagnosis not present

## 2020-10-08 DIAGNOSIS — R0781 Pleurodynia: Secondary | ICD-10-CM

## 2020-10-08 DIAGNOSIS — R079 Chest pain, unspecified: Secondary | ICD-10-CM | POA: Insufficient documentation

## 2020-10-08 DIAGNOSIS — R0789 Other chest pain: Secondary | ICD-10-CM

## 2020-10-08 MED ORDER — HYDROCODONE-ACETAMINOPHEN 5-325 MG PO TABS: 1.0000 | ORAL_TABLET | Freq: Four times a day (QID) | ORAL | 0 refills | Status: AC | PRN
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
## 2020-10-09 ENCOUNTER — Encounter: Payer: Self-pay | Admitting: Family Medicine

## 2020-10-09 ENCOUNTER — Other Ambulatory Visit: Payer: Self-pay | Admitting: Family Medicine

## 2020-10-09 MED ORDER — BACLOFEN 10 MG PO TABS: 10.0000 mg | ORAL_TABLET | Freq: Three times a day (TID) | ORAL | 0 refills | Status: DC

## 2020-10-10 ENCOUNTER — Ambulatory Visit: Payer: Self-pay | Admitting: Family Medicine

## 2020-10-15 DIAGNOSIS — S22000A Wedge compression fracture of unspecified thoracic vertebra, initial encounter for closed fracture: Secondary | ICD-10-CM | POA: Diagnosis not present

## 2020-10-15 DIAGNOSIS — R32 Unspecified urinary incontinence: Secondary | ICD-10-CM | POA: Diagnosis not present

## 2020-10-23 ENCOUNTER — Encounter: Payer: Self-pay | Admitting: Family Medicine

## 2020-10-30 ENCOUNTER — Other Ambulatory Visit: Payer: Self-pay

## 2020-10-30 MED ORDER — ONDANSETRON 8 MG PO TBDP: 8.0000 mg | ORAL_TABLET | Freq: Three times a day (TID) | ORAL | 0 refills | Status: DC | PRN

## 2020-11-01 ENCOUNTER — Telehealth: Payer: Self-pay | Admitting: *Deleted

## 2020-11-01 NOTE — Telephone Encounter (Signed)
Received referral for low dose lung cancer screening CT scan. Message left at phone number listed in EMR for patient to call me back to facilitate scheduling scan.  

## 2020-11-02 ENCOUNTER — Other Ambulatory Visit: Payer: Self-pay | Admitting: Family Medicine

## 2020-11-06 ENCOUNTER — Telehealth: Payer: Self-pay | Admitting: *Deleted

## 2020-11-06 ENCOUNTER — Encounter: Payer: Self-pay | Admitting: *Deleted

## 2020-11-06 DIAGNOSIS — F17200 Nicotine dependence, unspecified, uncomplicated: Secondary | ICD-10-CM

## 2020-11-06 DIAGNOSIS — Z122 Encounter for screening for malignant neoplasm of respiratory organs: Secondary | ICD-10-CM

## 2020-11-06 DIAGNOSIS — Z87891 Personal history of nicotine dependence: Secondary | ICD-10-CM

## 2020-11-06 NOTE — Telephone Encounter (Signed)
Received referral for initial lung cancer screening scan. Contacted patient and obtained smoking history,(current, 136.5 pack year) as well as answering questions related to screening process. Patient denies signs of lung cancer such as weight loss or hemoptysis. Patient denies comorbidity that would prevent curative treatment if lung cancer were found. Patient is scheduled for shared decision making visit and CT scan on 11/21/20 at 1015am.

## 2020-11-13 DIAGNOSIS — S22000A Wedge compression fracture of unspecified thoracic vertebra, initial encounter for closed fracture: Secondary | ICD-10-CM | POA: Diagnosis not present

## 2020-11-13 DIAGNOSIS — R32 Unspecified urinary incontinence: Secondary | ICD-10-CM | POA: Diagnosis not present

## 2020-11-19 ENCOUNTER — Encounter: Payer: Self-pay | Admitting: *Deleted

## 2020-11-20 ENCOUNTER — Encounter: Payer: Self-pay | Admitting: *Deleted

## 2020-11-21 ENCOUNTER — Inpatient Hospital Stay: Payer: Medicaid Other | Admitting: Oncology

## 2020-11-21 ENCOUNTER — Ambulatory Visit: Payer: Medicaid Other

## 2020-11-23 ENCOUNTER — Other Ambulatory Visit: Payer: Self-pay | Admitting: Family Medicine

## 2020-11-28 ENCOUNTER — Other Ambulatory Visit: Payer: Self-pay | Admitting: Nurse Practitioner

## 2020-11-28 NOTE — Telephone Encounter (Signed)
Requested medications are due for refill today.  yes  Requested medications are on the active medications list.  yes  Last refill. 10/30/2020  Future visit scheduled.   no  Notes to clinic.  Medication not delegated.

## 2020-11-29 ENCOUNTER — Encounter: Payer: Self-pay | Admitting: Family Medicine

## 2020-11-29 MED ORDER — ROPINIROLE HCL 4 MG PO TABS: 4.0000 mg | ORAL_TABLET | Freq: Every day | ORAL | 0 refills | Status: DC

## 2020-12-03 ENCOUNTER — Inpatient Hospital Stay: Payer: Medicaid Other | Attending: Nurse Practitioner | Admitting: Nurse Practitioner

## 2020-12-03 ENCOUNTER — Other Ambulatory Visit: Payer: Self-pay | Admitting: Nurse Practitioner

## 2020-12-03 DIAGNOSIS — F17210 Nicotine dependence, cigarettes, uncomplicated: Secondary | ICD-10-CM

## 2020-12-03 DIAGNOSIS — Z87891 Personal history of nicotine dependence: Secondary | ICD-10-CM

## 2020-12-03 DIAGNOSIS — Z01818 Encounter for other preprocedural examination: Secondary | ICD-10-CM

## 2020-12-03 DIAGNOSIS — Z720 Tobacco use: Secondary | ICD-10-CM | POA: Diagnosis not present

## 2020-12-03 NOTE — Telephone Encounter (Signed)
Requested medication (s) are due for refill today: no  Requested medication (s) are on the active medication list: yes  Last refill:  11/25/2020  Future visit scheduled:  no  Notes to clinic:  review for refill   Requested Prescriptions  Pending Prescriptions Disp Refills   SUMAtriptan 6 MG/0.5ML SOAJ [Pharmacy Med Name: SUMATRIPTAN 6MG /0.5ML PF INJ2X0.5ML] 1 mL 1    Sig: ADMINISTER 0.5 ML UNDER THE SKIN AT ONSET OF HEADACHE. MAY REPEAT AFTER 1 HOUR IF NO BETTER. MAXIMUM OF 2 DOSES IN 24 HOURS      Neurology:  Migraine Therapy - Triptan Passed - 12/03/2020  3:17 AM      Passed - Last BP in normal range    BP Readings from Last 1 Encounters:  10/08/20 130/80          Passed - Valid encounter within last 12 months    Recent Outpatient Visits           1 month ago Rib pain   [facility] [place], Megan P, DO   2 months ago Rib pain on right side   [facility], NP   4 months ago RLS (restless legs syndrome)   [facility] [place], Megan P, DO   5 months ago Pain in left axilla   [facility] [place], Megan P, DO   8 months ago Bipolar depression [facility])   [facility] [place], [place], [place]       Future Appointments             [facility] [place], NP [facility]

## 2020-12-03 NOTE — Progress Notes (Signed)
Virtual Visit via Video Enabled Telemedicine Note   I connected with Kim [name] on 12/03/20 at 2:00 PM EST by video enabled telemedicine visit and verified that I am speaking with the correct person using two identifiers.   I discussed the limitations, risks, security and privacy concerns of performing an evaluation and management service by telemedicine and the availability of in-person appointments. I also discussed with the patient that there may be a patient responsible charge related to this service. The patient expressed understanding and agreed to proceed.   Other persons participating in the visit and their role in the encounter: Burgess Estelle, RN- checking in patient & navigation  Patient's location: home  Provider's location: Clinic  Chief Complaint: Low Dose CT Screening  Patient agreed to evaluation by telemedicine to discuss shared decision making for consideration of low dose CT lung cancer screening.    In accordance with CMS guidelines, patient has met eligibility criteria including age, absence of signs or symptoms of lung cancer.  Social History   Tobacco Use  . Smoking status: Current Every Day Smoker    Packs/day: 3.50    Years: 39.00    Pack years: 136.50    Types: Cigarettes  . Smokeless tobacco: Never Used  Substance Use Topics  . Alcohol use: Yes    Alcohol/week: 40.0 standard drinks    Types: 40 Cans of beer per week    A shared decision-making session was conducted prior to the performance of CT scan. This includes one or more decision aids, includes benefits and harms of screening, follow-up diagnostic testing, over-diagnosis, false positive rate, and total radiation exposure.   Counseling on the importance of adherence to annual lung cancer LDCT screening, impact of co-morbidities, and ability or willingness to undergo diagnosis and treatment is imperative for compliance of the program.   Counseling on the importance of continued smoking  cessation for former smokers; the importance of smoking cessation for current smokers, and information about tobacco cessation interventions have been given to patient including [place] and [employer] [place] programs.   Written order for lung cancer screening with LDCT has been given to the patient and any and all questions have been answered to the best of my abilities.    Yearly follow up will be coordinated by Burgess Estelle, Thoracic Navigator.  I discussed the assessment and treatment plan with the patient. The patient was provided an opportunity to ask questions and all were answered. The patient agreed with the plan and demonstrated an understanding of the instructions.   The patient was advised to call back or seek an in-person evaluation if the symptoms worsen or if the condition fails to improve as anticipated.   I provided 15 minutes of face-to-face video visit time dedicated to the care of this patient on the date of this encounter to include pre-visit review of smoking history, face-to-face time with the patient, and post visit ordering of testing/documentation.   Beckey Rutter, DNP, AGNP-C [place] at [facility] [phone] (clinic)

## 2020-12-04 ENCOUNTER — Ambulatory Visit
Admission: RE | Admit: 2020-12-04 | Discharge: 2020-12-04 | Disposition: A | Discharge status: Home / Self Care | Payer: Medicaid Other | Source: Ambulatory Visit | Attending: Oncology | Admitting: Oncology

## 2020-12-04 ENCOUNTER — Inpatient Hospital Stay: Payer: Medicaid Other | Admitting: Nurse Practitioner

## 2020-12-04 ENCOUNTER — Other Ambulatory Visit: Payer: Self-pay | Admitting: Nurse Practitioner

## 2020-12-04 ENCOUNTER — Other Ambulatory Visit: Payer: Self-pay

## 2020-12-04 DIAGNOSIS — Z87891 Personal history of nicotine dependence: Secondary | ICD-10-CM | POA: Diagnosis not present

## 2020-12-04 DIAGNOSIS — F17200 Nicotine dependence, unspecified, uncomplicated: Secondary | ICD-10-CM

## 2020-12-04 DIAGNOSIS — Z122 Encounter for screening for malignant neoplasm of respiratory organs: Secondary | ICD-10-CM | POA: Insufficient documentation

## 2020-12-05 ENCOUNTER — Encounter: Payer: Self-pay | Admitting: Nurse Practitioner

## 2020-12-05 NOTE — Telephone Encounter (Signed)
FYI

## 2020-12-06 ENCOUNTER — Telehealth: Payer: Self-pay | Admitting: *Deleted

## 2020-12-06 NOTE — Telephone Encounter (Signed)
Notified patient of LDCT lung cancer screening program results with recommendation for 3 month follow up imaging. Also notified of incidental findings noted below and is encouraged to discuss further with PCP who will receive a copy of this note and/or the CT report. Patient verbalizes understanding.   IMPRESSION: 1. Lung-RADS 4A, suspicious. Follow up low-dose chest CT without contrast in 3 months (please use the following order, "CT CHEST LCS NODULE FOLLOW-UP W/O CM") is recommended. 2. Diffuse bronchial wall thickening with emphysema, as above; imaging findings suggestive of underlying COPD. Emphysema (ICD10-J43.9). 3. Chronic fracture deformities are noted involving the distal sternum, T12 vertebral body, and right posterior ninth rib.  Aortic Atherosclerosis (ICD10-I70.0) and Emphysema (ICD10-J43.9).

## 2020-12-14 DIAGNOSIS — R32 Unspecified urinary incontinence: Secondary | ICD-10-CM | POA: Diagnosis not present

## 2020-12-14 DIAGNOSIS — S22000A Wedge compression fracture of unspecified thoracic vertebra, initial encounter for closed fracture: Secondary | ICD-10-CM | POA: Diagnosis not present

## 2021-01-10 ENCOUNTER — Encounter: Payer: Self-pay | Admitting: Family Medicine

## 2021-01-10 ENCOUNTER — Other Ambulatory Visit: Payer: Self-pay

## 2021-01-10 MED ORDER — ROPINIROLE HCL 4 MG PO TABS: 4.0000 mg | ORAL_TABLET | Freq: Every day | ORAL | 1 refills | Status: DC

## 2021-01-10 NOTE — Telephone Encounter (Signed)
Patient has no upcoming appointment

## 2021-01-10 NOTE — Telephone Encounter (Signed)
Patient is overdue for routine follow up.  She will need an appointment in office for further refills.

## 2021-01-11 NOTE — Progress Notes (Signed)
BP 131/73   Pulse 69   Wt 191 lb 8 oz (86.9 kg)   SpO2 98%   BMI 30.91 kg/m    Subjective:    Patient ID: Kim [name], female    DOB: 10-27-1965, 55 y.o.   MRN: [id number]  HPI: Kim [name] is a 55 y.o. female  Chief Complaint  Patient presents with  . COPD    RESTLESS LEG SYNDROME Patient states this is not controlling her symptoms.  She deals with the symptoms but states she needs the Requip to help lessen them.   COPD/ TOBACCO USE Patient had CT lung in March which showed nodules, emphysema and aortic atherosclerosis.  Patient will have a follow up CT in June.  Patient does not currently have any inhalers but does endorse SOB at times and a persistent cough.  Patient currently smokes about 4 packs of cigarettes per day.    Denies HA, CP, palpitations, visual changes, and lower extremity swelling.  Relevant past medical, surgical, family and social history reviewed and updated as indicated. Interim medical history since our last visit reviewed. Allergies and medications reviewed and updated.  Review of Systems  Eyes: Negative for visual disturbance.  Respiratory: Positive for cough and shortness of breath. Negative for chest tightness.   Cardiovascular: Negative for chest pain, palpitations and leg swelling.  Neurological: Negative for headaches.    Per HPI unless specifically indicated above     Objective:    BP 131/73   Pulse 69   Wt 191 lb 8 oz (86.9 kg)   SpO2 98%   BMI 30.91 kg/m   Wt Readings from Last 3 Encounters:  01/14/21 191 lb 8 oz (86.9 kg)  12/04/20 190 lb (86.2 kg)  10/08/20 190 lb 9.6 oz (86.5 kg)    Physical Exam Vitals and nursing note reviewed.  Constitutional:      General: She is not in acute distress.    Appearance: Normal appearance. She is normal weight. She is not ill-appearing, toxic-appearing or diaphoretic.  HENT:     Head: Normocephalic.     Right Ear: External ear normal.     Left Ear: External ear normal.      Nose: Nose normal.     Mouth/Throat:     Mouth: Mucous membranes are moist.     Pharynx: Oropharynx is clear.  Eyes:     General:        Right eye: No discharge.        Left eye: No discharge.     Extraocular Movements: Extraocular movements intact.     Conjunctiva/sclera: Conjunctivae normal.     Pupils: Pupils are equal, round, and reactive to light.  Cardiovascular:     Rate and Rhythm: Normal rate and regular rhythm.     Heart sounds: No murmur heard.   Pulmonary:     Effort: Pulmonary effort is normal. No respiratory distress.     Breath sounds: Normal breath sounds. No wheezing or rales.  Musculoskeletal:     Cervical back: Normal range of motion and neck supple.  Skin:    General: Skin is warm and dry.     Capillary Refill: Capillary refill takes less than 2 seconds.  Neurological:     General: No focal deficit present.     Mental Status: She is alert and oriented to person, place, and time. Mental status is at baseline.  Psychiatric:        Mood and Affect: Mood normal.  Behavior: Behavior normal.        Thought Content: Thought content normal.        Judgment: Judgment normal.     Results for orders placed or performed during the hospital encounter of 12/06/15  CBC with Differential  Result Value Ref Range   WBC 7.9 3.6 - 11.0 K/uL   RBC 4.67 3.80 - 5.20 MIL/uL   Hemoglobin 14.9 12.0 - 16.0 g/dL   HCT 43.0 35.0 - 47.0 %   MCV 92.0 80.0 - 100.0 fL   MCH 31.9 26.0 - 34.0 pg   MCHC 34.6 32.0 - 36.0 g/dL   RDW 14.5 11.5 - 14.5 %   Platelets 275 150 - 440 K/uL   Neutrophils Relative % 67 %   Neutro Abs 5.3 1.4 - 6.5 K/uL   Lymphocytes Relative 25 %   Lymphs Abs 2.0 1.0 - 3.6 K/uL   Monocytes Relative 6 %   Monocytes Absolute 0.5 0.2 - 0.9 K/uL   Eosinophils Relative 1 %   Eosinophils Absolute 0.1 0 - 0.7 K/uL   Basophils Relative 1 %   Basophils Absolute 0.1 0 - 0.1 K/uL  Comprehensive metabolic panel  Result Value Ref Range   Sodium 137 135 -  145 mmol/L   Potassium 3.9 3.5 - 5.1 mmol/L   Chloride 108 101 - 111 mmol/L   CO2 21 (L) 22 - 32 mmol/L   Glucose, Bld 108 (H) 65 - 99 mg/dL   BUN 8 6 - 20 mg/dL   Creatinine, Ser 0.74 0.44 - 1.00 mg/dL   Calcium 9.2 8.9 - 10.3 mg/dL   Total Protein 8.0 6.5 - 8.1 g/dL   Albumin 4.5 3.5 - 5.0 g/dL   AST 26 15 - 41 U/L   ALT 21 14 - 54 U/L   Alkaline Phosphatase 92 38 - 126 U/L   Total Bilirubin 0.4 0.3 - 1.2 mg/dL   GFR calc non Af Amer >60 >60 mL/min   GFR calc Af Amer >60 >60 mL/min   Anion gap 8 5 - 15  Lipase, blood  Result Value Ref Range   Lipase 24 11 - 51 U/L      Assessment & Plan:   Problem List Items Addressed This Visit      Cardiovascular and Mediastinum   Migraines    Controlled. Continue with current medication regimen.       Aortic atherosclerosis ([place])    Found on CT in 2022.  Labs drawn today.  Will make recommendations  Based on lab results.       Relevant Orders   Lipid Profile     Respiratory   COPD (chronic obstructive pulmonary disease) ([place]) - Primary    Uncontrolled. Not ready to quit smoking. Albuterol and Stilito inhaler.  Discussed proper use of medications. Reviewed benefits and adverse effects. Will do spirometry at next visit.  Follow up in 3 months.      Relevant Medications   albuterol (VENTOLIN HFA) 108 (90 Base) MCG/ACT inhaler   Tiotropium Bromide-Olodaterol 2.5-2.5 MCG/ACT AERS   Other Relevant Orders   Lipid Profile   CBC w/Diff   Comp Met (CMET)     Other   RLS (restless legs syndrome)    Uncontrolled. Taking significant amount of Requip which she states does not control her symptoms but makes them more manageable.       Relevant Orders   Comp Met (CMET)       Follow up plan: Return in about 3  months (around 04/16/2021) for COPD and RLS (Spirometry).

## 2021-01-13 DIAGNOSIS — R32 Unspecified urinary incontinence: Secondary | ICD-10-CM | POA: Diagnosis not present

## 2021-01-13 DIAGNOSIS — S22000A Wedge compression fracture of unspecified thoracic vertebra, initial encounter for closed fracture: Secondary | ICD-10-CM | POA: Diagnosis not present

## 2021-01-14 ENCOUNTER — Encounter: Payer: Self-pay | Admitting: Nurse Practitioner

## 2021-01-14 ENCOUNTER — Other Ambulatory Visit: Payer: Self-pay

## 2021-01-14 ENCOUNTER — Ambulatory Visit (INDEPENDENT_AMBULATORY_CARE_PROVIDER_SITE_OTHER): Payer: Medicaid Other | Admitting: Nurse Practitioner

## 2021-01-14 VITALS — BP 131/73 | HR 69 | Wt 191.5 lb

## 2021-01-14 DIAGNOSIS — G2581 Restless legs syndrome: Secondary | ICD-10-CM

## 2021-01-14 DIAGNOSIS — Z1231 Encounter for screening mammogram for malignant neoplasm of breast: Secondary | ICD-10-CM

## 2021-01-14 DIAGNOSIS — I700 Atherosclerosis of aorta: Secondary | ICD-10-CM

## 2021-01-14 DIAGNOSIS — J449 Chronic obstructive pulmonary disease, unspecified: Secondary | ICD-10-CM | POA: Diagnosis not present

## 2021-01-14 DIAGNOSIS — E7800 Pure hypercholesterolemia, unspecified: Secondary | ICD-10-CM | POA: Diagnosis not present

## 2021-01-14 DIAGNOSIS — G43009 Migraine without aura, not intractable, without status migrainosus: Secondary | ICD-10-CM | POA: Diagnosis not present

## 2021-01-14 DIAGNOSIS — F319 Bipolar disorder, unspecified: Secondary | ICD-10-CM | POA: Diagnosis not present

## 2021-01-14 HISTORY — DX: Atherosclerosis of aorta: I70.0

## 2021-01-14 MED ORDER — ALBUTEROL SULFATE HFA 108 (90 BASE) MCG/ACT IN AERS: 2.0000 | INHALATION_SPRAY | Freq: Four times a day (QID) | RESPIRATORY_TRACT | 2 refills | Status: DC | PRN

## 2021-01-14 MED ORDER — ONDANSETRON 8 MG PO TBDP: 8.0000 mg | ORAL_TABLET | Freq: Three times a day (TID) | ORAL | 2 refills | Status: DC | PRN

## 2021-01-14 MED ORDER — TIOTROPIUM BROMIDE-OLODATEROL 2.5-2.5 MCG/ACT IN AERS: 2.0000 | INHALATION_SPRAY | Freq: Every day | RESPIRATORY_TRACT | 2 refills | Status: DC

## 2021-01-14 MED ORDER — ROPINIROLE HCL 4 MG PO TABS: 4.0000 mg | ORAL_TABLET | Freq: Every day | ORAL | 2 refills | Status: DC

## 2021-01-14 NOTE — Assessment & Plan Note (Signed)
Uncontrolled. Taking significant amount of Requip which she states does not control her symptoms but makes them more manageable.

## 2021-01-14 NOTE — Assessment & Plan Note (Signed)
Found on CT in 2022.  Labs drawn today.  Will make recommendations  Based on lab results.

## 2021-01-14 NOTE — Assessment & Plan Note (Signed)
Uncontrolled. Not ready to quit smoking. Albuterol and Stilito inhaler.  Discussed proper use of medications. Reviewed benefits and adverse effects. Will do spirometry at next visit.  Follow up in 3 months.

## 2021-01-14 NOTE — Assessment & Plan Note (Signed)
Controlled. Continue with current medication regimen.

## 2021-01-15 LAB — CBC WITH DIFFERENTIAL/PLATELET
Basophils Absolute: 0.1 10*3/uL (ref 0.0–0.2)
Basos: 1 %
EOS (ABSOLUTE): 0.4 10*3/uL (ref 0.0–0.4)
Eos: 5 %
Hematocrit: 45.9 % (ref 34.0–46.6)
Hemoglobin: 14.8 g/dL (ref 11.1–15.9)
Immature Grans (Abs): 0 10*3/uL (ref 0.0–0.1)
Immature Granulocytes: 0 %
Lymphocytes Absolute: 1.7 10*3/uL (ref 0.7–3.1)
Lymphs: 22 %
MCH: 31.2 pg (ref 26.6–33.0)
MCHC: 32.2 g/dL (ref 31.5–35.7)
MCV: 97 fL (ref 79–97)
Monocytes Absolute: 0.5 10*3/uL (ref 0.1–0.9)
Monocytes: 6 %
Neutrophils Absolute: 5 10*3/uL (ref 1.4–7.0)
Neutrophils: 66 %
Platelets: 277 10*3/uL (ref 150–450)
RBC: 4.75 x10E6/uL (ref 3.77–5.28)
RDW: 13.2 % (ref 11.7–15.4)
WBC: 7.6 10*3/uL (ref 3.4–10.8)

## 2021-01-15 LAB — COMPREHENSIVE METABOLIC PANEL
ALT: 19 IU/L (ref 0–32)
AST: 25 IU/L (ref 0–40)
Albumin/Globulin Ratio: 1.6 (ref 1.2–2.2)
Albumin: 4.6 g/dL (ref 3.8–4.9)
Alkaline Phosphatase: 127 IU/L — ABNORMAL HIGH (ref 44–121)
BUN/Creatinine Ratio: 15 (ref 9–23)
BUN: 11 mg/dL (ref 6–24)
Bilirubin Total: 0.2 mg/dL (ref 0.0–1.2)
CO2: 22 mmol/L (ref 20–29)
Calcium: 9.6 mg/dL (ref 8.7–10.2)
Chloride: 103 mmol/L (ref 96–106)
Creatinine, Ser: 0.73 mg/dL (ref 0.57–1.00)
Globulin, Total: 2.8 g/dL (ref 1.5–4.5)
Glucose: 94 mg/dL (ref 65–99)
Potassium: 5.6 mmol/L — ABNORMAL HIGH (ref 3.5–5.2)
Sodium: 140 mmol/L (ref 134–144)
Total Protein: 7.4 g/dL (ref 6.0–8.5)
eGFR: 98 mL/min/{1.73_m2} (ref >59)

## 2021-01-15 LAB — LIPID PANEL
Chol/HDL Ratio: 2.6 ratio (ref 0.0–4.4)
Cholesterol, Total: 166 mg/dL (ref 100–199)
HDL: 65 mg/dL (ref >39)
LDL Chol Calc (NIH): 82 mg/dL (ref 0–99)
Triglycerides: 104 mg/dL (ref 0–149)
VLDL Cholesterol Cal: 19 mg/dL (ref 5–40)

## 2021-01-15 NOTE — Progress Notes (Signed)
Please let patient know that her lab work came back looking great.  Her cholesterol is well controlled.  Complete blood count is normal.  Liver and kidney function are normal. However, her potassium is elevated.  I'd like to recheck this at a lab visit next week.  I have placed the order. Please make patient a follow up lab appointment.

## 2021-01-15 NOTE — Addendum Note (Signed)
Addended by: Larae Grooms on: 01/15/2021 08:13 AM   Modules accepted: Orders

## 2021-01-15 NOTE — Addendum Note (Signed)
Addended by: Larae Grooms on: 01/15/2021 08:14 AM   Modules accepted: Orders

## 2021-01-22 ENCOUNTER — Other Ambulatory Visit: Payer: Medicaid Other

## 2021-01-22 ENCOUNTER — Other Ambulatory Visit: Payer: Self-pay

## 2021-01-22 DIAGNOSIS — I700 Atherosclerosis of aorta: Secondary | ICD-10-CM | POA: Diagnosis not present

## 2021-01-22 DIAGNOSIS — G43009 Migraine without aura, not intractable, without status migrainosus: Secondary | ICD-10-CM

## 2021-01-22 DIAGNOSIS — J449 Chronic obstructive pulmonary disease, unspecified: Secondary | ICD-10-CM

## 2021-01-23 LAB — COMPREHENSIVE METABOLIC PANEL
ALT: 21 IU/L (ref 0–32)
AST: 21 IU/L (ref 0–40)
Albumin/Globulin Ratio: 1.4 (ref 1.2–2.2)
Albumin: 4.4 g/dL (ref 3.8–4.9)
Alkaline Phosphatase: 123 IU/L — ABNORMAL HIGH (ref 44–121)
BUN/Creatinine Ratio: 14 (ref 9–23)
BUN: 11 mg/dL (ref 6–24)
Bilirubin Total: 0.4 mg/dL (ref 0.0–1.2)
CO2: 22 mmol/L (ref 20–29)
Calcium: 9.7 mg/dL (ref 8.7–10.2)
Chloride: 103 mmol/L (ref 96–106)
Creatinine, Ser: 0.76 mg/dL (ref 0.57–1.00)
Globulin, Total: 3.1 g/dL (ref 1.5–4.5)
Glucose: 87 mg/dL (ref 65–99)
Potassium: 4.6 mmol/L (ref 3.5–5.2)
Sodium: 139 mmol/L (ref 134–144)
Total Protein: 7.5 g/dL (ref 6.0–8.5)
eGFR: 93 mL/min/{1.73_m2} (ref >59)

## 2021-01-23 NOTE — Progress Notes (Signed)
Hi Kim [name].  Your potassium came back normal.  This is great news.  We will continue to monitor this at future appointments.  See you at our next visit.

## 2021-02-04 DIAGNOSIS — F4312 Post-traumatic stress disorder, chronic: Secondary | ICD-10-CM | POA: Diagnosis not present

## 2021-02-04 DIAGNOSIS — G4700 Insomnia, unspecified: Secondary | ICD-10-CM | POA: Diagnosis not present

## 2021-02-04 DIAGNOSIS — F314 Bipolar disorder, current episode depressed, severe, without psychotic features: Secondary | ICD-10-CM | POA: Diagnosis not present

## 2021-02-13 DIAGNOSIS — R32 Unspecified urinary incontinence: Secondary | ICD-10-CM | POA: Diagnosis not present

## 2021-02-13 DIAGNOSIS — S22000A Wedge compression fracture of unspecified thoracic vertebra, initial encounter for closed fracture: Secondary | ICD-10-CM | POA: Diagnosis not present

## 2021-02-18 ENCOUNTER — Ambulatory Visit: Payer: Self-pay | Admitting: *Deleted

## 2021-02-18 ENCOUNTER — Encounter: Payer: Self-pay | Admitting: Family Medicine

## 2021-02-18 ENCOUNTER — Other Ambulatory Visit: Payer: Self-pay | Admitting: *Deleted

## 2021-02-18 DIAGNOSIS — R918 Other nonspecific abnormal finding of lung field: Secondary | ICD-10-CM

## 2021-02-18 DIAGNOSIS — Z87891 Personal history of nicotine dependence: Secondary | ICD-10-CM

## 2021-02-18 NOTE — Telephone Encounter (Signed)
Noted  

## 2021-02-18 NOTE — Telephone Encounter (Signed)
Pt reports cough "For years" blood in stools x 3 weeks. States "Bleeding at times without having a BM."  Stools are loose, "Throughout day." Also reports dizziness. States "I've had dizziness and abdominal pain for years." HAs CT lungs scheduled for 03/07/21. Pt has sent MyChart messages Per Dr. Ollen Barges response, may schedule in office visit. SPoke with Maximino Sarin, as Clydie Braun is out states may schedule wth any provider. Pt states can only be seen in mornings,"Won't drive any other time of day." Scheduled with Jolene for tomorrow. ADvised ED for worsening symptoms. Pt verbalizes understanding.  Reason for Disposition . MODERATE rectal bleeding (small blood clots, passing blood without stool, or toilet water turns red)  Answer Assessment - Initial Assessment Questions 1. APPEARANCE of BLOOD: "What color is it?" "Is it passed separately, on the surface of the stool, or mixed in with the stool?"      Varies 2. AMOUNT: "How much blood was passed?"      Commode, paper, SOmetimes without BM 3. FREQUENCY: "How many times has blood been passed with the stools?"      Each time 4. ONSET: "When was the blood first seen in the stools?" (Days or weeks)      2-3 weeks ago 5. DIARRHEA: "Is there also some diarrhea?" If Yes, ask: "How many diarrhea stools in the past 24 hours?"      Yes, Throughout day 6. CONSTIPATION: "Do you have constipation?" If Yes, ask: "How bad is it?"     no 7. RECURRENT SYMPTOMS: "Have you had blood in your stools before?" If Yes, ask: "When was the last time?" and "What happened that time?"      At times 8. BLOOD THINNERS: "Do you take any blood thinners?" (e.g., Coumadin/warfarin, Pradaxa/dabigatran, aspirin)      9. OTHER SYMPTOMS: "Do you have any other symptoms?"  (e.g., abdomen pain, vomiting, dizziness, fever)     Dizzy, stomach pain for years, dizziness  Protocols used: RECTAL BLEEDING-A-AH

## 2021-02-18 NOTE — Telephone Encounter (Signed)
FYI pt scheduled with [facility] tomorrow

## 2021-02-18 NOTE — Progress Notes (Signed)
Contacted and scheduled for LCS nodule follow up scan. Patient is a current smoker with a 136.5 pack year history.

## 2021-02-19 ENCOUNTER — Other Ambulatory Visit: Payer: Self-pay

## 2021-02-19 ENCOUNTER — Encounter: Payer: Self-pay | Admitting: Nurse Practitioner

## 2021-02-19 ENCOUNTER — Ambulatory Visit (INDEPENDENT_AMBULATORY_CARE_PROVIDER_SITE_OTHER): Payer: Medicaid Other | Admitting: Nurse Practitioner

## 2021-02-19 VITALS — BP 132/80 | HR 98 | Temp 98.3°F | Wt 191.1 lb

## 2021-02-19 DIAGNOSIS — K625 Hemorrhage of anus and rectum: Secondary | ICD-10-CM

## 2021-02-19 DIAGNOSIS — Z8719 Personal history of other diseases of the digestive system: Secondary | ICD-10-CM | POA: Insufficient documentation

## 2021-02-19 DIAGNOSIS — I700 Atherosclerosis of aorta: Secondary | ICD-10-CM

## 2021-02-19 DIAGNOSIS — F17219 Nicotine dependence, cigarettes, with unspecified nicotine-induced disorders: Secondary | ICD-10-CM | POA: Diagnosis not present

## 2021-02-19 DIAGNOSIS — J432 Centrilobular emphysema: Secondary | ICD-10-CM | POA: Diagnosis not present

## 2021-02-19 HISTORY — DX: Personal history of other diseases of the digestive system: Z87.19

## 2021-02-19 HISTORY — DX: Hemorrhage of anus and rectum: K62.5

## 2021-02-19 LAB — CBC WITH DIFFERENTIAL/PLATELET
Hematocrit: 42.6 % (ref 34.0–46.6)
Hemoglobin: 14.8 g/dL (ref 11.1–15.9)
Lymphocytes Absolute: 1.5 10*3/uL (ref 0.7–3.1)
Lymphs: 20 %
MCH: 33.4 pg — ABNORMAL HIGH (ref 26.6–33.0)
MCHC: 34.7 g/dL (ref 31.5–35.7)
MCV: 96 fL (ref 79–97)
MID (Absolute): 0.5 10*3/uL (ref 0.1–1.6)
MID: 7 %
Neutrophils Absolute: 5.6 10*3/uL (ref 1.4–7.0)
Neutrophils: 73 %
Platelets: 249 10*3/uL (ref 150–450)
RBC: 4.43 x10E6/uL (ref 3.77–5.28)
RDW: 15.5 % — ABNORMAL HIGH (ref 11.7–15.4)
WBC: 7.6 10*3/uL (ref 3.4–10.8)

## 2021-02-19 NOTE — Patient Instructions (Signed)
START BREZTRI 2 inhalations twice a day and STOP Stiolto daily -- use Albuterol as needed   COPD and Physical Activity Chronic obstructive pulmonary disease (COPD) is a long-term (chronic) condition that affects the lungs. COPD is a general term that can be used to describe many different lung problems that cause lung swelling (inflammation) and limit airflow, including chronic bronchitis and emphysema. The main symptom of COPD is shortness of breath, which makes it harder to do even simple tasks. This can also make it harder to exercise and be active. Talk with your health care provider about treatments to help you breathe better and actions you can take to prevent breathing problems during physical activity. What are the benefits of exercising with COPD? Exercising regularly is an important part of a healthy lifestyle. You can still exercise and do physical activities even though you have COPD. Exercise and physical activity improve your shortness of breath by increasing blood flow (circulation). This causes your heart to pump more oxygen through your body. Moderate exercise can improve your:  Oxygen use.  Energy level.  Shortness of breath.  Strength in your breathing muscles.  Heart health.  Sleep.  Self-esteem and feelings of self-worth.  Depression, stress, and anxiety levels. Exercise can benefit everyone with COPD. The severity of your disease may affect how hard you can exercise, especially at first, but everyone can benefit. Talk with your health care provider about how much exercise is safe for you, and which activities and exercises are safe for you.   What actions can I take to prevent breathing problems during physical activity?  Sign up for a pulmonary rehabilitation program. This type of program may include: ? Education about lung diseases. ? Exercise classes that teach you how to exercise and be more active while improving your breathing. This usually  involves:  Exercise using your lower extremities, such as a stationary bicycle.  About 30 minutes of exercise, 2 to 5 times per week, for 6 to 12 weeks  Strength training, such as push ups or leg lifts. ? Nutrition education. ? Group classes in which you can talk with others who also have COPD and learn ways to manage stress.  If you use an oxygen tank, you should use it while you exercise. Work with your health care provider to adjust your oxygen for your physical activity. Your resting flow rate is different from your flow rate during physical activity.  While you are exercising: ? Take slow breaths. ? Pace yourself and do not try to go too fast. ? Purse your lips while breathing out. Pursing your lips is similar to a kissing or whistling position. ? If doing exercise that uses a quick burst of effort, such as weight lifting:  Breathe in before starting the exercise.  Breathe out during the hardest part of the exercise (such as raising the weights). Where to find support You can find support for exercising with COPD from:  Your health care provider.  A pulmonary rehabilitation program.  Your local health department or community health programs.  Support groups, online or in-person. Your health care provider may be able to recommend support groups. Where to find more information You can find more information about exercising with COPD from:  American Lung Association: [place].  COPD Foundation: AlmostHot.gl. Contact a health care provider if:  Your symptoms get worse.  You have chest pain.  You have nausea.  You have a fever.  You have trouble talking or catching your breath.  You  want to start a new exercise program or a new activity. Summary  COPD is a general term that can be used to describe many different lung problems that cause lung swelling (inflammation) and limit airflow. This includes chronic bronchitis and emphysema.  Exercise and physical  activity improve your shortness of breath by increasing blood flow (circulation). This causes your heart to provide more oxygen to your body.  Contact your health care provider before starting any exercise program or new activity. Ask your health care provider what exercises and activities are safe for you. This information is not intended to replace advice given to you by your health care provider. Make sure you discuss any questions you have with your health care provider. Document Revised: 12/22/2018 Document Reviewed: 09/24/2017 Elsevier Patient Education  2021 [employer].

## 2021-02-19 NOTE — Assessment & Plan Note (Signed)
Acute with diarrhea-like stools for 2 weeks.  She does have history of ileus with x 2 colostomies in past and 3/4 colon removed per her report.  Does take daily BC powder and heavier alcohol intake, concern for GI bleed.  CBC in office shows H/H 14.8/42.6, MCV 96.2 (stable).  Will check CMP, TSH, iron/ferritin, FOBT, and stool for infectious element.  Recommend she cut back on both BC powder and alcohol use, which she refuses.  Will obtain CT scan abdomen/pelvis to further assess.  At this time refuses referral to GI, although highly recommended with her history.  Discussed with her if any abnormalities on labs or imaging, it will be important to get her in with GI for further assessment.  Return in 2 weeks, sooner if worsening.

## 2021-02-19 NOTE — Progress Notes (Signed)
BP 132/80 (BP Location: Left Arm)   Pulse 98   Temp 98.3 F (36.8 C)   Wt 191 lb 2 oz (86.7 kg)   SpO2 97%   BMI 30.85 kg/m    Subjective:    Patient ID: Kim [name], female    DOB: 1966-04-08, 55 y.o.   MRN: [id number]  HPI: Kim [name] is a 55 y.o. female  Chief Complaint  Patient presents with  . Diarrhea    With blood   . Cough   RECTAL BLEEDING & DIARRHEA Has had diarrhea for two weeks, every bowel movement is loose stool.  Has been eating cheese with no benefit.  Has noticed blood with this every movement, is bright red -- in the toilet and on tissue.  Has history of two colostomies -- has 3/4 intestine removed -- blockage in colon of unknown cause in past she reports.  Has had colonoscopies, "too many times to count and will never go through another".  Last one about 3 years ago per her report.    Takes a lot of BC powders for her back pain daily = 10 or less a day.  Reports she refuses to go to pain clinic, so this is her option for pain.  Does use alcohol --- about 18 beer a day, refuses cut back.  No drug use, does have friends who smoke pot.   Duration: 2 weeks Bright red rectal bleeding: yes  Amount of blood: small with no clots Frequency: every bowel movement Melena: no  Spotting on toilet tissue: no  Anal fullness: no  Perianal pain: yes due to all the wiping Severity: mild Perianal irritation/itching: no  Constipation: no  Chronic straining/valsava:  no  Anal trauma/intercourse: no  Hemorrhoids: no  Previous colonoscopy: no   COPD Does smoke about 4 PPD, has smoked since age 38.  Was in the foster system in [place].  Did have a lung CT on 12/05/20 with need to return on 03/07/21 due to suspicious nodules -- right upper lobe with 7.6 mm + aortic atherosclerosis.  She reports her cough gets worse with quitting.  Is taking Stiolto daily and Albuterol as needed.  Reports she does not notice the Stiolto helping.  CT scan did note moderate to  severe emphysema.  She reports that abx does not work for her because she drinks beer -- "can not hang out at pool and drink beer" if takes abx and does not like steroids due to weight gain. COPD status: uncontrolled Satisfied with current treatment?: no Oxygen use: no Dyspnea frequency: every day Cough frequency: every day with recent worsening Rescue inhaler frequency:  1-2 times a day Limitation of activity: no Productive cough: yes Last Spirometry: unknown Pneumovax: refuses Influenza: Up to Date  Relevant past medical, surgical, family and social history reviewed and updated as indicated. Interim medical history since our last visit reviewed. Allergies and medications reviewed and updated.  Review of Systems  Constitutional: Negative for activity change, appetite change, diaphoresis, fatigue and unexpected weight change.  Respiratory: Positive for cough, shortness of breath and wheezing. Negative for chest tightness.   Cardiovascular: Negative for chest pain, palpitations and leg swelling.  Gastrointestinal: Positive for blood in stool and diarrhea. Negative for abdominal distention, abdominal pain, constipation, nausea and vomiting.  Neurological: Negative.   Psychiatric/Behavioral: Negative.     Per HPI unless specifically indicated above     Objective:    BP 132/80 (BP Location: Left Arm)   Pulse 98  Temp 98.3 F (36.8 C)   Wt 191 lb 2 oz (86.7 kg)   SpO2 97%   BMI 30.85 kg/m   Wt Readings from Last 3 Encounters:  02/19/21 191 lb 2 oz (86.7 kg)  01/14/21 191 lb 8 oz (86.9 kg)  12/04/20 190 lb (86.2 kg)    Physical Exam Vitals and nursing note reviewed.  Constitutional:      General: She is awake. She is not in acute distress.    Appearance: She is well-developed and well-groomed. She is obese. She is not ill-appearing or toxic-appearing.  HENT:     Head: Normocephalic.     Right Ear: Hearing normal.     Left Ear: Hearing normal.  Eyes:     General: Lids  are normal. Scleral icterus present.        Right eye: No discharge.        Left eye: No discharge.     Conjunctiva/sclera: Conjunctivae normal.     Pupils: Pupils are equal, round, and reactive to light.  Neck:     Thyroid: No thyromegaly.     Vascular: No carotid bruit.  Cardiovascular:     Rate and Rhythm: Normal rate and regular rhythm.     Heart sounds: Normal heart sounds. No murmur heard. No gallop.   Pulmonary:     Effort: Pulmonary effort is normal. No accessory muscle usage or respiratory distress.     Breath sounds: Decreased breath sounds and wheezing present.     Comments: Diminished breath sounds throughout with intermittent expiratory wheezes noted.  Intermittent nonproductive cough present.  No SOB with talking. Abdominal:     General: Bowel sounds are normal. There is no distension.     Palpations: Abdomen is soft. There is no hepatomegaly.     Tenderness: There is no abdominal tenderness. There is no right CVA tenderness, left CVA tenderness or guarding.     Comments: Multiple areas of scarring on abdomen from previous procedures.  Genitourinary:    Comments: Refuses rectal exam. Musculoskeletal:     Cervical back: Normal range of motion and neck supple.     Right lower leg: No edema.     Left lower leg: No edema.  Lymphadenopathy:     Cervical: No cervical adenopathy.  Skin:    General: Skin is warm and dry.  Neurological:     Mental Status: She is alert and oriented to person, place, and time.     Deep Tendon Reflexes: Reflexes are normal and symmetric.     Reflex Scores:      Brachioradialis reflexes are 2+ on the right side and 2+ on the left side.      Patellar reflexes are 2+ on the right side and 2+ on the left side. Psychiatric:        Attention and Perception: Attention normal.        Mood and Affect: Mood normal.        Speech: Speech normal.        Behavior: Behavior normal. Behavior is cooperative.        Thought Content: Thought content normal.     Results for orders placed or performed in visit on 01/22/21  Comp Met (CMET)  Result Value Ref Range   Glucose 87 65 - 99 mg/dL   BUN 11 6 - 24 mg/dL   Creatinine, Ser 0.76 0.57 - 1.00 mg/dL   eGFR 93 >59 mL/min/1.73   BUN/Creatinine Ratio 14 9 - 23   Sodium  139 134 - 144 mmol/L   Potassium 4.6 3.5 - 5.2 mmol/L   Chloride 103 96 - 106 mmol/L   CO2 22 20 - 29 mmol/L   Calcium 9.7 8.7 - 10.2 mg/dL   Total Protein 7.5 6.0 - 8.5 g/dL   Albumin 4.4 3.8 - 4.9 g/dL   Globulin, Total 3.1 1.5 - 4.5 g/dL   Albumin/Globulin Ratio 1.4 1.2 - 2.2   Bilirubin Total 0.4 0.0 - 1.2 mg/dL   Alkaline Phosphatase 123 (H) 44 - 121 IU/L   AST 21 0 - 40 IU/L   ALT 21 0 - 32 IU/L      Assessment & Plan:   Problem List Items Addressed This Visit      Cardiovascular and Mediastinum   Aortic atherosclerosis ([place])    Noted on past CT lung screening.  Recommend use of statin for cholesterol lowering an prevention, she refuses.  Recommend daily ASA, refused.        Respiratory   Centrilobular emphysema (HCC) - Primary    Chronic and poorly controlled with moderate to severe emphysema noted on CT lung screening. She continues to smoke 4 PPD and refuses to quit.  At this time poor response to [employer].  Suspect she currently may have exacerbation, but refuses abx because she would have to stop drinking beer and refuses Prednisone due to weight gain.   - Will trial Breztri, triple therapy, provided sample in office today and instructed her on how to use + wrote instructions down on forms to take home.  Instructed her to [employer].   - Use Albuterol only if needed.   - Recommend complete cessation of smoking, which she refuses - Continue annual lung screening, is due for repeat on June 22nd due to nodules noted on recent imaging.   Return in 2 weeks for follow-up with PCP.  Will need spirometry and possible pulmonary referral.        Digestive   Rectal bleeding    Acute with diarrhea-like stools  for 2 weeks.  She does have history of ileus with x 2 colostomies in past and 3/4 colon removed per her report.  Does take daily BC powder and heavier alcohol intake, concern for GI bleed.  CBC in office shows H/H 14.8/42.6, MCV 96.2 (stable).  Will check CMP, TSH, iron/ferritin, FOBT, and stool for infectious element.  Recommend she cut back on both BC powder and alcohol use, which she refuses.  Will obtain CT scan abdomen/pelvis to further assess.  At this time refuses referral to GI, although highly recommended with her history.  Discussed with her if any abnormalities on labs or imaging, it will be important to get her in with GI for further assessment.  Return in 2 weeks, sooner if worsening.      Relevant Orders   CBC With Differential/Platelet   Comprehensive metabolic panel   TSH   Iron, TIBC and Ferritin Panel   Fecal occult blood, imunochemical   Cdiff NAA+O+P+Stool Culture   CBC With Differential/Platelet   CT Abdomen Pelvis Wo Contrast     Nervous and Auditory   Nicotine dependence, cigarettes, w unsp disorders    With severe COPD.  I have recommended complete cessation of tobacco use. I have discussed various options available for assistance with tobacco cessation including over the counter methods (Nicotine gum, patch and lozenges). We also discussed prescription options (Chantix, Nicotine Inhaler / Nasal Spray). The patient is not interested in pursuing any prescription tobacco cessation options  at this time.         Other   History of ileus    History of in past due to blockage and had 2 colostomies.  At this time concern with her use of BC powders so often, smoking, and nodules of lung found on lung screening.  Will obtain labs today.  She refuses GI referral.  Will order CT abdomen and pelvis to further assess and discussed with her if abnormal findings on either labs or imaging will need to pursue GI referral for further evaluation.      Relevant Orders   CT Abdomen  Pelvis Wo Contrast       Follow up plan: Return in about 2 weeks (around 03/05/2021) for COPD and RECTAL BLEEDING WITH [name].

## 2021-02-19 NOTE — Assessment & Plan Note (Addendum)
History of in past due to blockage and had 2 colostomies.  At this time concern with her use of BC powders so often, smoking, and nodules of lung found on lung screening.  Will obtain labs today.  She refuses GI referral.  Will order CT abdomen and pelvis to further assess and discussed with her if abnormal findings on either labs or imaging will need to pursue GI referral for further evaluation.

## 2021-02-19 NOTE — Assessment & Plan Note (Signed)
With severe COPD.  I have recommended complete cessation of tobacco use. I have discussed various options available for assistance with tobacco cessation including over the counter methods (Nicotine gum, patch and lozenges). We also discussed prescription options (Chantix, Nicotine Inhaler / Nasal Spray). The patient is not interested in pursuing any prescription tobacco cessation options at this time.

## 2021-02-19 NOTE — Assessment & Plan Note (Addendum)
Chronic and poorly controlled with moderate to severe emphysema noted on CT lung screening. She continues to smoke 4 PPD and refuses to quit.  At this time poor response to [employer].  Suspect she currently may have exacerbation, but refuses abx because she would have to stop drinking beer and refuses Prednisone due to weight gain.   - Will trial Breztri, triple therapy, provided sample in office today and instructed her on how to use + wrote instructions down on forms to take home.  Instructed her to [employer].   - Use Albuterol only if needed.   - Recommend complete cessation of smoking, which she refuses - Continue annual lung screening, is due for repeat on June 22nd due to nodules noted on recent imaging.   Return in 2 weeks for follow-up with PCP.  Will need spirometry and possible pulmonary referral.

## 2021-02-19 NOTE — Assessment & Plan Note (Signed)
Noted on past CT lung screening.  Recommend use of statin for cholesterol lowering an prevention, she refuses.  Recommend daily ASA, refused.

## 2021-02-20 ENCOUNTER — Other Ambulatory Visit: Payer: Self-pay | Admitting: Nurse Practitioner

## 2021-02-20 LAB — TSH: TSH: 1.13 u[IU]/mL (ref 0.450–4.500)

## 2021-02-20 LAB — COMPREHENSIVE METABOLIC PANEL
ALT: 21 IU/L (ref 0–32)
AST: 22 IU/L (ref 0–40)
Albumin/Globulin Ratio: 1.5 (ref 1.2–2.2)
Albumin: 4.3 g/dL (ref 3.8–4.9)
Alkaline Phosphatase: 119 IU/L (ref 44–121)
BUN/Creatinine Ratio: 16 (ref 9–23)
BUN: 14 mg/dL (ref 6–24)
Bilirubin Total: 0.3 mg/dL (ref 0.0–1.2)
CO2: 23 mmol/L (ref 20–29)
Calcium: 9.8 mg/dL (ref 8.7–10.2)
Chloride: 104 mmol/L (ref 96–106)
Creatinine, Ser: 0.87 mg/dL (ref 0.57–1.00)
Globulin, Total: 2.9 g/dL (ref 1.5–4.5)
Glucose: 80 mg/dL (ref 65–99)
Potassium: 5.2 mmol/L (ref 3.5–5.2)
Sodium: 145 mmol/L — ABNORMAL HIGH (ref 134–144)
Total Protein: 7.2 g/dL (ref 6.0–8.5)
eGFR: 79 mL/min/{1.73_m2} (ref >59)

## 2021-02-20 LAB — IRON,TIBC AND FERRITIN PANEL
Ferritin: 59 ng/mL (ref 15–150)
Iron Saturation: 22 % (ref 15–55)
Iron: 79 ug/dL (ref 27–159)
Total Iron Binding Capacity: 352 ug/dL (ref 250–450)
UIBC: 273 ug/dL (ref 131–425)

## 2021-02-20 NOTE — Progress Notes (Signed)
Contacted via MyChart   Good afternoon Kim [name], your labs have returned.  Overall everything is normal with exception of mild elevation in sodium level, salt.  I recommend increasing water intake, decreasing alcohol intake, and lowering salt in diet.  Any questions? Keep being awesome!!  Thank you for allowing me to participate in your care.  I appreciate you. Kindest regards, Adeyemi Hamad

## 2021-02-27 ENCOUNTER — Ambulatory Visit: Admission: RE | Admit: 2021-02-27 | Payer: Medicaid Other | Source: Ambulatory Visit

## 2021-03-04 ENCOUNTER — Telehealth: Payer: Self-pay | Admitting: Nurse Practitioner

## 2021-03-04 NOTE — Telephone Encounter (Signed)
..   Medicaid Managed Care   Unsuccessful Outreach Note  03/04/2021 Name: Kim [name] MRN: [id number] DOB: 05-10-1966  Referred by: Larae Grooms, NP Reason for referral : High Risk Managed Medicaid (I tried to reach this patient today to get her scheduled for a phone visit with the [facility] Team. I left a message on her VM.)   An unsuccessful telephone outreach was attempted today. The patient was referred to the case management team for assistance with care management and care coordination.   Follow Up Plan: The care management team will reach out to the patient again over the next 7-14 days.   Weston Settle Care Guide, High Risk Medicaid Managed Care Embedded Care Coordination [facility]  [facility]

## 2021-03-05 ENCOUNTER — Ambulatory Visit: Payer: Medicaid Other | Admitting: Nurse Practitioner

## 2021-03-07 ENCOUNTER — Ambulatory Visit
Admission: RE | Admit: 2021-03-07 | Discharge: 2021-03-07 | Disposition: A | Discharge status: Home / Self Care | Payer: Medicaid Other | Source: Ambulatory Visit | Attending: Nurse Practitioner | Admitting: Nurse Practitioner

## 2021-03-07 ENCOUNTER — Other Ambulatory Visit: Payer: Self-pay

## 2021-03-07 DIAGNOSIS — Z87891 Personal history of nicotine dependence: Secondary | ICD-10-CM | POA: Diagnosis not present

## 2021-03-07 DIAGNOSIS — I700 Atherosclerosis of aorta: Secondary | ICD-10-CM | POA: Diagnosis not present

## 2021-03-07 DIAGNOSIS — R911 Solitary pulmonary nodule: Secondary | ICD-10-CM | POA: Diagnosis not present

## 2021-03-07 DIAGNOSIS — R918 Other nonspecific abnormal finding of lung field: Secondary | ICD-10-CM | POA: Diagnosis present

## 2021-03-07 DIAGNOSIS — S22080A Wedge compression fracture of T11-T12 vertebra, initial encounter for closed fracture: Secondary | ICD-10-CM | POA: Diagnosis not present

## 2021-03-07 DIAGNOSIS — J432 Centrilobular emphysema: Secondary | ICD-10-CM | POA: Diagnosis not present

## 2021-03-08 ENCOUNTER — Telehealth (INDEPENDENT_AMBULATORY_CARE_PROVIDER_SITE_OTHER): Payer: Medicaid Other | Admitting: Nurse Practitioner

## 2021-03-08 ENCOUNTER — Encounter: Payer: Self-pay | Admitting: Nurse Practitioner

## 2021-03-08 ENCOUNTER — Telehealth: Payer: Self-pay | Admitting: *Deleted

## 2021-03-08 ENCOUNTER — Ambulatory Visit: Payer: Medicaid Other | Admitting: Nurse Practitioner

## 2021-03-08 DIAGNOSIS — J439 Emphysema, unspecified: Secondary | ICD-10-CM | POA: Diagnosis not present

## 2021-03-08 DIAGNOSIS — J189 Pneumonia, unspecified organism: Secondary | ICD-10-CM | POA: Diagnosis not present

## 2021-03-08 MED ORDER — PREDNISONE 10 MG PO TABS: 10.0000 mg | ORAL_TABLET | Freq: Every day | ORAL | 0 refills | Status: DC

## 2021-03-08 MED ORDER — AZITHROMYCIN 250 MG PO TABS: ORAL_TABLET | ORAL | 0 refills | Status: AC

## 2021-03-08 NOTE — Progress Notes (Signed)
There were no vitals taken for this visit.   Subjective:    Patient ID: Kim [name], female    DOB: 01-04-1966, 55 y.o.   MRN: [id number]  HPI: Kim [name] is a 55 y.o. female  Chief Complaint  Patient presents with   COPD    Discuss CT resu   RECTAL BLEEDING & DIARRHEA Patient states the bleeding has resolved. Her main concern now is her COPD and cough.  COPD Does smoke about 4 PPD, has smoked since age 45.  Patient states she continues to have a cough and SOB.  She had a follow up CT yesterday but hasn't heard about the results yet.  Is taking Stiolto daily and Albuterol as needed.  Reports she does not notice the Stiolto helping.  COPD status: uncontrolled Satisfied with current treatment?: no Oxygen use: no Dyspnea frequency: every day Cough frequency: every day with recent worsening Rescue inhaler frequency:  1-2 times a day Limitation of activity: no Productive cough: yes Last Spirometry: unknown Pneumovax: refuses Influenza: Up to Date  Relevant past medical, surgical, family and social history reviewed and updated as indicated. Interim medical history since our last visit reviewed. Allergies and medications reviewed and updated.  Review of Systems  Constitutional:  Negative for activity change, appetite change, diaphoresis, fatigue and unexpected weight change.  Respiratory:  Positive for cough and shortness of breath. Negative for chest tightness and wheezing.   Cardiovascular:  Negative for chest pain, palpitations and leg swelling.  Gastrointestinal:  Negative for blood in stool.  Neurological: Negative.   Psychiatric/Behavioral: Negative.     Per HPI unless specifically indicated above     Objective:    There were no vitals taken for this visit.  Wt Readings from Last 3 Encounters:  02/19/21 191 lb 2 oz (86.7 kg)  01/14/21 191 lb 8 oz (86.9 kg)  12/04/20 190 lb (86.2 kg)    Physical Exam Vitals and nursing note reviewed.  HENT:      Head: Normocephalic.     Right Ear: Hearing normal.     Left Ear: Hearing normal.     Nose: Nose normal.  Eyes:     Pupils: Pupils are equal, round, and reactive to light.  Pulmonary:     Effort: Pulmonary effort is normal. No respiratory distress.  Neurological:     Mental Status: She is alert.  Psychiatric:        Mood and Affect: Mood normal.        Behavior: Behavior normal.        Thought Content: Thought content normal.        Judgment: Judgment normal.   Results for orders placed or performed in visit on 02/19/21  Comprehensive metabolic panel  Result Value Ref Range   Glucose 80 65 - 99 mg/dL   BUN 14 6 - 24 mg/dL   Creatinine, Ser 6.10 0.57 - 1.00 mg/dL   eGFR 79 >00 [place]/OYL/8.55   BUN/Creatinine Ratio 16 9 - 23   Sodium 145 (H) 134 - 144 mmol/L   Potassium 5.2 3.5 - 5.2 mmol/L   Chloride 104 96 - 106 mmol/L   CO2 23 20 - 29 mmol/L   Calcium 9.8 8.7 - 10.2 mg/dL   Total Protein 7.2 6.0 - 8.5 g/dL   Albumin 4.3 3.8 - 4.9 g/dL   Globulin, Total 2.9 1.5 - 4.5 g/dL   Albumin/Globulin Ratio 1.5 1.2 - 2.2   Bilirubin Total 0.3 0.0 - 1.2 mg/dL  Alkaline Phosphatase 119 44 - 121 IU/L   AST 22 0 - 40 IU/L   ALT 21 0 - 32 IU/L  TSH  Result Value Ref Range   TSH 1.130 0.450 - 4.500 uIU/mL  Iron, TIBC and Ferritin Panel  Result Value Ref Range   Total Iron Binding Capacity 352 250 - 450 ug/dL   UIBC 273 131 - 425 ug/dL   Iron 79 27 - 159 ug/dL   Iron Saturation 22 15 - 55 %   Ferritin 59 15 - 150 ng/mL  CBC With Differential/Platelet  Result Value Ref Range   WBC 7.6 3.4 - 10.8 x10E3/uL   RBC 4.43 3.77 - 5.28 x10E6/uL   Hemoglobin 14.8 11.1 - 15.9 g/dL   Hematocrit 42.6 34.0 - 46.6 %   MCV 96 79 - 97 fL   MCH 33.4 (H) 26.6 - 33.0 pg   MCHC 34.7 31.5 - 35.7 g/dL   RDW 15.5 (H) 11.7 - 15.4 %   Platelets 249 150 - 450 x10E3/uL   Neutrophils 73 Not Estab. %   Lymphs 20 Not Estab. %   MID 7 Not Estab. %   Neutrophils Absolute 5.6 1.4 - 7.0 x10E3/uL    Lymphocytes Absolute 1.5 0.7 - 3.1 x10E3/uL   MID (Absolute) 0.5 0.1 - 1.6 X10E3/uL      Assessment & Plan:   Problem List Items Addressed This Visit   None Visit Diagnoses     Pulmonary emphysema, unspecified emphysema type ([place])    -  Primary   Patient given Prendisone and Azithromycin due to CT results. Referral placed to pulmonolgy.    Relevant Medications   predniSONE (DELTASONE) 10 MG tablet   azithromycin (ZITHROMAX) 250 MG tablet   Other Relevant Orders   Ambulatory referral to Pulmonology   Pulmonary infection       Patient given Azithromycin due to possible infection on CT.    Relevant Medications   predniSONE (DELTASONE) 10 MG tablet   azithromycin (ZITHROMAX) 250 MG tablet        Follow up plan: Return if symptoms worsen or fail to improve.  This visit was completed via MyChart due to the restrictions of the COVID-19 pandemic. All issues as above were discussed and addressed. Physical exam was done as above through visual confirmation on MyChart. If it was felt that the patient should be evaluated in the office, they were directed there. The patient verbally consented to this visit. Location of the patient: Home Location of the provider: Office Those involved with this call:  Provider: [name], NP CMA: Tiffany Reel, CMA  Front Desk/Registration: [place] Time spent on call: 20 minutes with patient face to face via video conference. More than 50% of this time was spent in counseling and coordination of care. 30 minutes total spent in review of patient's record and preparation of their chart.

## 2021-03-08 NOTE — Telephone Encounter (Addendum)
Review of the findings shows that it appears that she has some small air bronchograms in that area agree with treatment with antibiotics and steroids.  I agree with pulmonary consultation.  Recommend short interval follow-up of 3 months on the CT scan.  Reviewed results with patient as well as recommendations. Patient verbalizes understanding and agreement.

## 2021-03-08 NOTE — Telephone Encounter (Signed)
Requesting pulmonary opinion

## 2021-03-11 NOTE — Telephone Encounter (Signed)
Dr. Jayme Cloud, where would you like to schedule patient?

## 2021-03-12 NOTE — Telephone Encounter (Signed)
Next available is fine.  This is not urgent but she should have pulmonary consultation due to other findings in the CT.  We will follow closely the areas in question.

## 2021-03-12 NOTE — Telephone Encounter (Signed)
Appt scheduled for 04/19/2021 at 9:00. Patient is aware and voiced her understanding.  Nothing further needed at this time.

## 2021-03-13 ENCOUNTER — Other Ambulatory Visit: Payer: Self-pay | Admitting: Nurse Practitioner

## 2021-03-13 NOTE — Telephone Encounter (Signed)
Future visit in 1 month  

## 2021-03-14 ENCOUNTER — Telehealth: Payer: Self-pay

## 2021-03-14 MED ORDER — BREZTRI AEROSPHERE 160-9-4.8 MCG/ACT IN AERO: 2.0000 | INHALATION_SPRAY | Freq: Two times a day (BID) | RESPIRATORY_TRACT | 11 refills | Status: DC

## 2021-03-14 NOTE — Telephone Encounter (Signed)
PA for [facility] initiated and submitted via Cover My Meds. Key: [id number]

## 2021-03-15 DIAGNOSIS — R32 Unspecified urinary incontinence: Secondary | ICD-10-CM | POA: Diagnosis not present

## 2021-03-15 DIAGNOSIS — S22000A Wedge compression fracture of unspecified thoracic vertebra, initial encounter for closed fracture: Secondary | ICD-10-CM | POA: Diagnosis not present

## 2021-03-15 NOTE — Telephone Encounter (Signed)
PA approved. Mychart message sent to patient notifying her.

## 2021-04-04 ENCOUNTER — Ambulatory Visit: Payer: Self-pay | Admitting: *Deleted

## 2021-04-04 NOTE — Telephone Encounter (Signed)
Please let patient know that she should be seen in the emergency room. If she doesn't want to go there she can go to an Urgent care.

## 2021-04-04 NOTE — Telephone Encounter (Signed)
Please advise 

## 2021-04-04 NOTE — Telephone Encounter (Addendum)
Pt called in c/o having sharp pains that occur in the left side of her chest followed by heaviness in her whole left chest, left shoulder and around her shoulder blade and a heavy feeling into her left arm.  This has happened 3 times since last week.   "I was just sitting here on the couch and all of a sudden a sharp pain hit me in my left chest so bad it threw me to the back of the couch".   "Now I'm having a real heavy feeling".    "It feels different than anything pain I've had before".   "I'm sweating".   She mentioned she has COPD and is feeling more short of breath than she usually does".    See triage notes.  She is refusing to go to the ED.   "I don't go to emergency rooms".   "I hear too many horrible stories about them".    I explained to her why she needed to go to the ED versus a doctor's office.    She still responds,   "I don't go to emergency rooms, I just don't".    "I'm leaving in the morning to drive to [place]".   I emphasized the importance of being evaluated before a long trip like that however she still refuses to go to the ED.  I'm send my notes to Larae Grooms, NP with [facility] so she will be aware.   Notes sent high priority.  I also called into the office and spoke with [place].   I made her aware of the situation too.   She requested I send my notes which I've done.

## 2021-04-04 NOTE — Telephone Encounter (Signed)
Patient notified

## 2021-04-04 NOTE — Telephone Encounter (Signed)
Reason for Disposition  [1] Chest pain lasts > 5 minutes AND [2] age > 30 AND [3] one or more cardiac risk factors (e.g., diabetes, high blood pressure, high cholesterol, smoker, or strong family history of heart disease)    COPD,  brother died of "massive heart attack at a young age  Answer Assessment - Initial Assessment Questions 1. LOCATION: "Where does it hurt?"       I'm having chest pain.   It's happened 3 times.   Last week it happened.   Left center of my chest is a stabbing pain.   I'm just sitting here and it hit me hard.   There's a bunch of heavy pressure on my left chest.    20 minutes ago it started.    It's really heavy.   It's happened 3 times. My brother died of a massive heart attack.  I also have COPD.   Nodules in my lungs too.    2. RADIATION: "Does the pain go anywhere else?" (e.g., into neck, jaw, arms, back)     I have fractures in my back.   I always feel pain.    My left arm is feeling heavy.    3. ONSET: "When did the chest pain begin?" (Minutes, hours or days)      20 minutes ago it started.    The pressure is still there but the stabbing is gone.   I'm feeling short of breath.   I'm breaking out in a sweat too.   4. PATTERN "Does the pain come and go, or has it been constant since it started?"  "Does it get worse with exertion?"      Stabbing pain in chest happens then the heaviness occurs.   It's real heavy feeling now. 5. DURATION: "How long does it last" (e.g., seconds, minutes, hours)     Started about 20 minutes before I called you. 6. SEVERITY: "How bad is the pain?"  (e.g., Scale 1-10; mild, moderate, or severe)    - MILD (1-3): doesn't interfere with normal activities     - MODERATE (4-7): interferes with normal activities or awakens from sleep    - SEVERE (8-10): excruciating pain, unable to do any normal activities       The sharp pains hit hard and last a few seconds then the pressure is there in my whole left side of my chest and my left upper back and  arm feel heavy.    I have fractures in my back so it hurts all the time.   This is a different feeling pressure than the pain I have in my upper back and around my shoulder blade usually from the fractures. 7. CARDIAC RISK FACTORS: "Do you have any history of heart problems or risk factors for heart disease?" (e.g., angina, prior heart attack; diabetes, high blood pressure, high cholesterol, smoker, or strong family history of heart disease)     My brother died of a massive heart attack at a young age.  8. PULMONARY RISK FACTORS: "Do you have any history of lung disease?"  (e.g., blood clots in lung, asthma, emphysema, birth control pills)   I have COPD.   I'm feeling more short of breath than I usually do and I've got the fan on because I'm so hot.   She has a pulse oximeter and it's reading 97% O2 sat with a pulse of 104. 9. CAUSE: "What do you think is causing the chest pain?"  I don't know.   It could be my heart but I've had heart tests and everything was fine but this feels different in my chest. 10. OTHER SYMPTOMS: "Do you have any other symptoms?" (e.g., dizziness, nausea, vomiting, sweating, fever, difficulty breathing, cough)       Sweating, more short of breath than her usual. 11. PREGNANCY: "Is there any chance you are pregnant?" "When was your last menstrual period?"       Not asked due to age  Protocols used: Chest Pain-A-AH

## 2021-04-09 ENCOUNTER — Other Ambulatory Visit: Payer: Self-pay | Admitting: *Deleted

## 2021-04-09 ENCOUNTER — Other Ambulatory Visit: Payer: Self-pay

## 2021-04-09 NOTE — Patient Instructions (Signed)
Visit Information  Ms. Tarkington was given information about Medicaid Managed Care team care coordination services as a part of their [facility] Community Plan Medicaid benefit. Celine Mans Andalon verbally consented to engagement with the [facility] Managed Care team.   If you are experiencing a medical emergency, please call 911 or report to your local emergency department or urgent care.   If you have a non-emergency medical problem during routine business hours, please contact your provider's office and ask to speak with a nurse.   For questions related to your [facility], please call: [phone] or visit the homepage here: [email or web address]  If you would like to schedule transportation through your [facility], please call the following number at least 2 days in advance of your appointment: [phone].   Call the Behavioral Health Crisis Line at [phone], at any time, 24 hours a day, 7 days a week. If you are in danger or need immediate medical attention call 911.  If you would like help to quit smoking, call 1-800-QUIT-NOW (602-822-8794) OR Espaol: 1-855-Djelo-Ya (2-725-366-4403) o para ms informacin haga clic aqu or Text READY to 474-259 to register via text  Ms. Fasnacht - following are the goals we discussed in your visit today:   Goals Addressed             This Visit's Progress    Track and Manage My Symptoms-COPD       Timeframe:  Long-Range Goal Priority:  High Start Date:  04/09/21                           Expected End Date: 07/10/21                      Follow Up Date 05/03/21    - keep scheduled appointment with PCP and Pulmonlogy - work with MM Pharmacist, Harrold Donath for medication management - follow up with requested referral for ophthalmology - begin a symptom diary - keep follow-up appointments    Why is this important?   Tracking your  symptoms and other information about your health helps your doctor plan your care.  Write down the symptoms, the time of day, what you were doing and what medicine you are taking.  You will soon learn how to manage your symptoms.             Please see education materials related to COPD provided by MyChart link.  Patient verbalizes understanding of instructions provided today.   Telephone follow up appointment with Managed Medicaid care management team member scheduled for:05/03/21 @ 1pm  [name] RN, BSN [place]  [facility] RN Care Coordinator   Following is a copy of your plan of care:  Patient Care Plan: COPD (Adult)     Problem Identified: Disease Progression (COPD)      Goal: Disease Progression Minimized or Managed   Start Date: 04/09/2021  Expected End Date: 07/10/2021  This Visit's Progress: On track  Priority: High  Note:   Current Barriers:  Knowledge deficits related to basic COPD self care/management-Ms. Arline desires to manage her health at home. She has been diagnosed with COPD and has a consult with Pulmonology on 04/30/21. Ms. Keepers is a smoker and smokes 3-4PPD. She does not desire to quit or cut back. Patient wears glasses and contacts, but has not had an eye exam since 2018.  Limited Social Support Does not  adhere to provider recommendations re: smoking cessation Does not maintain contact with provider office Does not contact provider office for questions/concerns  Case Manager Clinical Goal(s): patient will verbalize basic understanding of COPD disease process and self care activities  Patient will attend pulmonology consult on 04/30/21 Interventions:  Inter-disciplinary care team collaboration (see longitudinal plan of care) Provided patient with basic written and verbal COPD education on self care/management/and exacerbation prevention  Provided patient with COPD action plan and reinforced importance of daily self  assessment Discussed Pulmonary Rehab and offered to assist with referral placement Collaborate with PCP for Ophthalmology referral and BP monitor Collaborate with MM Pharmacist for medication management Patient Goals/Self-Care Activities:  - keep scheduled appointment with PCP and Pulmonlogy - work with MM Pharmacist, Harrold Donath for medication management - follow up with requested referral for ophthalmology - begin a symptom diary - keep follow-up appointments  Follow Up Plan: Telephone follow up appointment with care management team member scheduled for:05/03/21 @ 1pm

## 2021-04-09 NOTE — Patient Outreach (Signed)
Medicaid Managed Care   Nurse Care Manager Note  04/09/2021 Name:  Kim [name] MRN:  [id number] DOB:  17-Apr-1966  Kim [name] is an 55 y.o. year old female who is a primary patient of Larae Grooms, NP.  The [facility] Managed Care Coordination team was consulted for assistance with:    COPD  Kim [name] was given information about Medicaid Managed Care Coordination team services today. Kim [name] agreed to services and verbal consent obtained.  Engaged with patient by telephone for initial visit in response to provider referral for case management and/or care coordination services.   Assessments/Interventions:  Review of past medical history, allergies, medications, health status, including review of consultants reports, laboratory and other test data, was performed as part of comprehensive evaluation and provision of chronic care management services.  SDOH (Social Determinants of Health) assessments and interventions performed: SDOH Interventions    Flowsheet Row Most Recent Value  SDOH Interventions   Food Insecurity Interventions Patient Refused  [Patient aware of local food pantries]  Housing Interventions Intervention Not Indicated  [Due to location, frequent problems with mice]  Transportation Interventions Intervention Not Indicated       Care Plan  Allergies  Allergen Reactions   Penicillins Other (See Comments)    Unknown childhood reaction   Sulfa Antibiotics Swelling    Medications Reviewed Today     Reviewed by Heidi Dach, RN (Registered Nurse) on 04/09/21 at 1315  Med List Status: <None>   Medication Order Taking? Sig Documenting Provider Last Dose Status Informant  albuterol (VENTOLIN HFA) 108 (90 Base) MCG/ACT inhaler [id number] Yes Inhale 2 puffs into the lungs every 6 (six) hours as needed for wheezing or shortness of breath. Larae Grooms, NP Taking Active   Budeson-Glycopyrrol-Formoterol (BREZTRI AEROSPHERE) 160-9-4.8  MCG/ACT Sandrea Matte [id number] Yes Inhale 2 puffs into the lungs 2 (two) times daily. Larae Grooms, NP Taking Active   fluticasone [facility]) 50 MCG/ACT nasal spray [id number] Yes SHAKE LIQUID AND USE 1 SPRAY IN EACH NOSTRIL DAILY Larae Grooms, NP Taking Active   hydrOXYzine (ATARAX/VISTARIL) 50 MG tablet [id number] Yes Take by mouth. [provider] Taking Active            Med Note (Junice Fei A   Tue Apr 09, 2021  1:13 PM) Taking nightly  ondansetron (ZOFRAN-ODT) 8 MG disintegrating tablet [id number] Yes Take 1 tablet (8 mg total) by mouth every 8 (eight) hours as needed for nausea or vomiting. Larae Grooms, NP Taking Active   predniSONE (DELTASONE) 10 MG tablet [id number] No Take 1 tablet (10 mg total) by mouth daily with breakfast.  Patient not taking: Reported on 04/09/2021   Larae Grooms, NP Not Taking Active            Med Note Ardelia Mems, Dakota Vanwart A   Tue Apr 09, 2021  1:13 PM) completed  rOPINIRole (REQUIP) 4 MG tablet [id number] Yes TAKE 1 TABLET(4 MG) BY MOUTH FIVE TIMES DAILY Larae Grooms, NP Taking Active   SUMAtriptan 6 MG/0.5ML Ivory Broad [id number] Yes INJECT 0.5 ML INTO THE SKIN AT ONSET OF HEADACHE. MAY REPEAT AFTER 1 HOUR IF NO BETTER. MAXIMUM OF 2 DOSES IN 24 HOURS Larae Grooms, NP Taking Active             Patient Active Problem List   Diagnosis Date Noted   Rectal bleeding 02/19/2021   History of ileus 02/19/2021   Aortic atherosclerosis (HCC) 01/14/2021   Lumbar radiculopathy 08/01/2020   Cervical radiculopathy 08/01/2020   Compression fracture  of T12 vertebra (HCC) 08/01/2020   Bipolar depression (HCC) 04/05/2020   PTSD (post-traumatic stress disorder) 04/05/2020   GERD (gastroesophageal reflux disease)    Centrilobular emphysema (HCC) 02/21/2016   Chronic abdominal pain 11/09/2015   RLS (restless legs syndrome) 07/02/2015   Migraines 07/02/2015   Nicotine dependence, cigarettes, w unsp disorders 06/25/2011    Conditions to be  addressed/monitored per PCP order:  COPD  Care Plan : COPD (Adult)  Updates made by Heidi Dach, RN since 04/09/2021 12:00 AM     Problem: Disease Progression (COPD)      Goal: Disease Progression Minimized or Managed   Start Date: 04/09/2021  Expected End Date: 07/10/2021  This Visit's Progress: On track  Priority: High  Note:   Current Barriers:  Knowledge deficits related to basic COPD self care/management-Kim [name] desires to manage her health at home. She has been diagnosed with COPD and has a consult with Pulmonology on 04/30/21. Kim [name] is a smoker and smokes 3-4PPD. She does not desire to quit or cut back. Patient wears glasses and contacts, but has not had an eye exam since 2018.  Limited Social Support Does not adhere to provider recommendations re: smoking cessation Does not maintain contact with provider office Does not contact provider office for questions/concerns  Case Manager Clinical Goal(s): patient will verbalize basic understanding of COPD disease process and self care activities  Patient will attend pulmonology consult on 04/30/21 Interventions:  Inter-disciplinary care team collaboration (see longitudinal plan of care) Provided patient with basic written and verbal COPD education on self care/management/and exacerbation prevention  Provided patient with COPD action plan and reinforced importance of daily self assessment Discussed Pulmonary Rehab and offered to assist with referral placement Collaborate with PCP for Ophthalmology referral and BP monitor Collaborate with MM Pharmacist for medication management Patient Goals/Self-Care Activities:  - keep scheduled appointment with PCP and Pulmonlogy - work with MM Pharmacist, Harrold Donath for medication management - follow up with requested referral for ophthalmology - begin a symptom diary - keep follow-up appointments  Follow Up Plan: Telephone follow up appointment with care management team member scheduled  for:05/03/21 @ 1pm      Follow Up:  Patient agrees to Care Plan and Follow-up.  Plan: The Managed Medicaid care management team will reach out to the patient again over the next 30 days.  Date/time of next scheduled RN care management/care coordination outreach:  05/03/21 @ 1pm  [name] RN, BSN [place]  [facility]

## 2021-04-10 ENCOUNTER — Telehealth: Payer: Self-pay | Admitting: Nurse Practitioner

## 2021-04-10 DIAGNOSIS — Z0100 Encounter for examination of eyes and vision without abnormal findings: Secondary | ICD-10-CM

## 2021-04-10 NOTE — Telephone Encounter (Signed)
Signed and on your desk 

## 2021-04-10 NOTE — Telephone Encounter (Signed)
In your folder for signature 

## 2021-04-10 NOTE — Telephone Encounter (Signed)
Referral placed for ophthalmology.  Can we write a script for a blood pressure cuff.

## 2021-04-12 ENCOUNTER — Other Ambulatory Visit: Payer: Self-pay

## 2021-04-12 NOTE — Patient Outreach (Signed)
Medicaid Managed Care    Pharmacy Note  04/12/2021 Name: Kim [name] MRN: [id number] DOB: [birth year]  Kim [name] is a 55 y.o. year old female who is a primary care patient of Larae Grooms, NP. The [facility] Managed Care Coordination team was consulted for assistance with disease management and care coordination needs.    Engaged with patient Engaged with patient by telephone for initial visit in response to referral for case management and/or care coordination services.  Kim [name] was given information about Managed Medicaid Care Coordination team services today. Kim [name] agreed to services and verbal consent obtained.   Objective:  Lab Results  Component Value Date   CREATININE 0.87 02/19/2021   CREATININE 0.76 01/22/2021   CREATININE 0.73 01/14/2021    No results found for: HGBA1C     Component Value Date/Time   CHOL 166 01/14/2021 0832   CHOL 211 (H) 07/02/2015 1131   TRIG 104 01/14/2021 0832   TRIG 274 (H) 07/02/2015 1131   HDL 65 01/14/2021 0832   CHOLHDL 2.6 01/14/2021 0832   VLDL 55 (H) 07/02/2015 1131   LDLCALC 82 01/14/2021 0832    Other: (TSH, CBC, Vit D, etc.)  Clinical ASCVD: No  The 10-year ASCVD risk score Denman George DC Jr., et al., 2013) is: 3.3%   Values used to calculate the score:     Age: 73 years     Sex: Female     Is Non-Hispanic African American: No     Diabetic: No     Tobacco smoker: Yes     Systolic Blood Pressure: 132 mmHg     Is BP treated: No     HDL Cholesterol: 65 mg/dL     Total Cholesterol: 166 mg/dL    Other: ([id number] if Afib, PHQ9 if depression, MMRC or CAT for COPD, ACT, DEXA)  BP Readings from Last 3 Encounters:  02/19/21 132/80  01/14/21 131/73  10/08/20 130/80    Assessment/Interventions: Review of patient past medical history, allergies, medications, health status, including review of consultants reports, laboratory and other test data, was performed as part of comprehensive evaluation  and provision of chronic care management services.   Centrilobular Emphysema  Tobacco Use: High Risk   Smoking Tobacco Use: Every Day   Smokeless Tobacco Use: Never   Tobacco Use   Smoking status: Current Every Day Smoker      Packs/day: 3.50      Years: 39.00      Pack years: 136.50      Types: Cigarettes   Smokeless tobacco: Never Used  Substance Use Topics   Alcohol use: Yes      Alcohol/week: 40.0 standard drinks      Types: 40 Cans of beer per week        -No interest in quitting Albuterol Breztri Fluticasone Plan: At goal,  patient stable/ symptoms controlled   RLS Ropinirole 4mg  Tried/Failed: Hydroxyzine 50mg  HS Plan: Says Hydroxyzine is making legs worse, would like something else. Will ask PCP  Migraine -States gets headaches daily, never tried preventative therapy -Sumatriptan 6mg /0.62mL injection -OTC BC Plan: Will ask PCP to try a preventative therapy  SDOH (Social Determinants of Health) assessments and interventions performed:    Care Plan  Allergies  Allergen Reactions   Penicillins Other (See Comments)    Unknown childhood reaction   Sulfa Antibiotics Swelling    Medications Reviewed Today     Reviewed by , [facility] (Pharmacist) on 04/12/21 at 1131  Med List  Status: <None>   Medication Order Taking? Sig Documenting Provider Last Dose Status Informant  albuterol (VENTOLIN HFA) 108 (90 Base) MCG/ACT inhaler [id number] Yes Inhale 2 puffs into the lungs every 6 (six) hours as needed for wheezing or shortness of breath. Larae Grooms, NP Taking Active   Budeson-Glycopyrrol-Formoterol (BREZTRI AEROSPHERE) 160-9-4.8 MCG/ACT Sandrea Matte [id number] Yes Inhale 2 puffs into the lungs 2 (two) times daily. Larae Grooms, NP Taking Active   fluticasone [facility]) 50 MCG/ACT nasal spray [id number] Yes SHAKE LIQUID AND USE 1 SPRAY IN EACH NOSTRIL DAILY Larae Grooms, NP Taking Active   hydrOXYzine (ATARAX/VISTARIL) 50 MG tablet [id number] No Take by  mouth.  Patient not taking: Reported on 04/12/2021   [provider] Not Taking Consider Medication Status and Discontinue            Med Note (ROBB, MELANIE A   Tue Apr 09, 2021  1:13 PM) Taking nightly  ondansetron (ZOFRAN-ODT) 8 MG disintegrating tablet [id number] Yes Take 1 tablet (8 mg total) by mouth every 8 (eight) hours as needed for nausea or vomiting. Larae Grooms, NP Taking Active   predniSONE (DELTASONE) 10 MG tablet [id number] No Take 1 tablet (10 mg total) by mouth daily with breakfast.  Patient not taking: No sig reported   Larae Grooms, NP Not Taking Consider Medication Status and Discontinue            Med Note (ROBB, MELANIE A   Tue Apr 09, 2021  1:13 PM) completed  rOPINIRole (REQUIP) 4 MG tablet [id number] Yes TAKE 1 TABLET(4 MG) BY MOUTH FIVE TIMES DAILY Larae Grooms, NP Taking Active   SUMAtriptan 6 MG/0.5ML Ivory Broad [id number] Yes INJECT 0.5 ML INTO THE SKIN AT ONSET OF HEADACHE. MAY REPEAT AFTER 1 HOUR IF NO BETTER. MAXIMUM OF 2 DOSES IN 24 HOURS Larae Grooms, NP Taking Active             Patient Active Problem List   Diagnosis Date Noted   Rectal bleeding 02/19/2021   History of ileus 02/19/2021   Aortic atherosclerosis (HCC) 01/14/2021   Lumbar radiculopathy 08/01/2020   Cervical radiculopathy 08/01/2020   Compression fracture of T12 vertebra (HCC) 08/01/2020   Bipolar depression (HCC) 04/05/2020   PTSD (post-traumatic stress disorder) 04/05/2020   GERD (gastroesophageal reflux disease)    Centrilobular emphysema (HCC) 02/21/2016   Chronic abdominal pain 11/09/2015   RLS (restless legs syndrome) 07/02/2015   Migraines 07/02/2015   Nicotine dependence, cigarettes, w unsp disorders 06/25/2011    Conditions to be addressed/monitored: COPD  Care Plan : Medication Management  Updates made by Zettie Pho, RPH since 04/12/2021 12:00 AM     Problem: Health Promotion or Disease Self-Management (General Plan of Care)      Goal:  Medication Management   Note:   Current Barriers:  Does not adhere to prescribed medication regimen Does not maintain contact with provider office Does not contact provider office for questions/concerns   Pharmacist Clinical Goal(s):  Over the next 180 days, patient will contact provider office for questions/concerns as evidenced notation of same in electronic health record through collaboration with PharmD and provider.    Interventions: Inter-disciplinary care team collaboration (see longitudinal plan of care) Comprehensive medication review performed; medication list updated in electronic medical record     Patient Goals/Self-Care Activities Over the next 180 days, patient will:  - take medications as prescribed collaborate with provider on medication access solutions  Follow Up Plan: The patient has been provided with contact information for the care  management team and has been advised to call with any health related questions or concerns.      Task: Mutually Develop and Malen Gauze Achievement of Patient Goals   Note:   Care Management Activities:    - verbalization of feelings encouraged    Notes:        Medication Assistance:  Requesting medication deliveries -Due to constant headaches, patient is requesting a service to deliver her meds. When her headaches get bad, she can't drive and get her meds. Plan: Verbal consent obtained for [facility] enhanced pharmacy services (medication synchronization, adherence packaging, delivery coordination). A medication sync plan was created to allow patient to get all medications delivered once every 30 to 90 days per patient preference. Patient understands they have freedom to choose pharmacy and clinical pharmacist will coordinate care between all prescribers and [facility].   Follow up: Agree/   Plan: The patient has been provided with contact information for the care management team and has been advised to call  with any health related questions or concerns.   Artelia Laroche, Pharm.D., Managed Medicaid Pharmacist - [phone]

## 2021-04-12 NOTE — Patient Outreach (Signed)
Medication Name PCP Transfer (put Dr.'s name) -Timing Last Fill Date & Day Supply Format: MM/DD/YY - DS (If last fill/DS unavailable, list pt.'s quantity on hand) Anticipated next due date  Format: MM/DD/YY      BB  B  L  EM  BT    Albuterol puffer Larae Grooms       03/09/21 for 30 days 04/22/21  Breztri 160-9-4.8 Larae Grooms       03/20/21 for 30 days 04/22/21  Fluticasone 50mg  Nasal       02/15/21 for 34 days 04/22/21  Ondansetron 8mg  ODT TID 06/22/21       01/14/21 for 30 days 04/22/21  Ropinirole 4mg  5x/day 03/16/21       03/13/21 for 30 days 04/22/21  Sumatriptan 6mg /0.44ml injection 03/15/21       11/26/20 for 30 days 04/22/21    Patient unsure of when meds will be needed, I'll create this schedule to call and check in with her in a week

## 2021-04-15 DIAGNOSIS — S22000A Wedge compression fracture of unspecified thoracic vertebra, initial encounter for closed fracture: Secondary | ICD-10-CM | POA: Diagnosis not present

## 2021-04-15 DIAGNOSIS — R32 Unspecified urinary incontinence: Secondary | ICD-10-CM | POA: Diagnosis not present

## 2021-04-16 ENCOUNTER — Ambulatory Visit: Payer: Medicaid Other | Admitting: Nurse Practitioner

## 2021-04-17 ENCOUNTER — Telehealth: Payer: Self-pay | Admitting: Nurse Practitioner

## 2021-04-17 ENCOUNTER — Encounter: Payer: Self-pay | Admitting: Nurse Practitioner

## 2021-04-17 ENCOUNTER — Telehealth (INDEPENDENT_AMBULATORY_CARE_PROVIDER_SITE_OTHER): Payer: Medicaid Other | Admitting: Nurse Practitioner

## 2021-04-17 DIAGNOSIS — G2581 Restless legs syndrome: Secondary | ICD-10-CM

## 2021-04-17 DIAGNOSIS — J432 Centrilobular emphysema: Secondary | ICD-10-CM | POA: Diagnosis not present

## 2021-04-17 MED ORDER — SUMATRIPTAN SUCCINATE 6 MG/0.5ML ~~LOC~~ SOAJ: SUBCUTANEOUS | 1 refills | Status: DC

## 2021-04-17 MED ORDER — ROPINIROLE HCL 4 MG PO TABS: ORAL_TABLET | ORAL | 1 refills | Status: DC

## 2021-04-17 MED ORDER — ONDANSETRON 8 MG PO TBDP: 8.0000 mg | ORAL_TABLET | Freq: Three times a day (TID) | ORAL | 2 refills | Status: DC | PRN

## 2021-04-17 NOTE — Assessment & Plan Note (Signed)
Chronic. Ongoing.  Continue with Requip 4mg  5x daily.  Patient denies concerns at visit today. Follow up in 6 months for reevaluation.

## 2021-04-17 NOTE — Telephone Encounter (Signed)
-----   Message from Zettie Pho, [facility] sent at 04/17/2021  1:30 PM EDT ----- I guess I was too brief in my description, she's currently in [place] driving around so yes, she does drive.  However, there are days when her migraines knocks her out so she doesn't feel comfortable driving. Because of this, I'm going to try to deliver her meds for her to prevent her from having to make the decision of   Driving to get meds when she doesn't feel comfortable to drive  Since you don't prescribe her Migraine meds, that's fine, could you send in her non-migraine meds that you do prescribe to [facility] so I can still take care of her?  Thank you so much. Please let me know if you have any other questions regarding this ----- Message ----- From: Kim Grooms, NP Sent: 04/12/2021  12:54 PM EDT To: Zettie Pho, [facility]  Dalene Seltzer,  Thank you for reaching out.  She has never addressed her migraines with me and would need a visit which she does not like to do.    She takes Requip 5x daily for her RLS.   She drives to her appointments and picks up her medications.    Clydie Braun ----- Message ----- From: Zettie Pho, [facility] Sent: 04/12/2021  11:37 AM EDT To: Kim Grooms, NP  Morning! My name is Kim [name] the PharmD on the MM team. I had a few questions.  Patient didn't tolerate Hydroxyzine, could we try something else for RLS?  Also, she states she has daily migraines, could we try preventative therapy?  Finally, the patient states due to her migraines she has trouble getting her meds from the Pharmacy (Can't drive). I found a Pharmacy that will deliver her meds for free. Could you please send new scripts to  [facility] [place]  Thanks so much!!

## 2021-04-17 NOTE — Assessment & Plan Note (Signed)
Chronic, uncontrolled.  Appointment was supposed to be office visit but patient rescheduled as a virtual.  Need spirometry on patient. Continue with Breztri inhaler.  Discussed use of albuterol PRN for SOB.  Follow up in 6 months for reevaluation.  Patient smokes about 4 packs of cigarettes daily.  Not ready to quit.

## 2021-04-17 NOTE — Progress Notes (Signed)
There were no vitals taken for this visit.   Subjective:    Patient ID: Kim [name], female    DOB: Jul 05, 1966, 55 y.o.   MRN: [id number]  HPI: Kim [name] is a 55 y.o. female  Chief Complaint  Patient presents with   COPD   RESTLESS LEGS Duration: years Discomfort description:  burning and cramping Pain: yes Location: lower legs Bilateral: yes Symmetric: yes Severity: severe Onset:   unknown been many years Frequency:  intermittent Symptoms only occur while legs at rest: yes Sudden unintentional leg jerking: yes Bed partner bothered by leg movements: no LE numbness: no Decreased sensation: yes Weakness: no Insomnia: yes Daytime somnolence: no Fatigue: yes Alleviating factors:  Aggravating factors:  Status: stable Treatments attempted:   COPD COPD status: controlled Satisfied with current treatment?: yes Oxygen use: no Dyspnea frequency: a couple of times a day Cough frequency: daily Rescue inhaler frequency:  doesn't use Limitation of activity: yes Productive cough: no Last Spirometry: none Pneumovax: Not up to Date Influenza: Not up to Date   Relevant past medical, surgical, family and social history reviewed and updated as indicated. Interim medical history since our last visit reviewed. Allergies and medications reviewed and updated.  Review of Systems  Respiratory:  Positive for cough and shortness of breath.   Neurological:        Legs jumping   Per HPI unless specifically indicated above     Objective:    There were no vitals taken for this visit.  Wt Readings from Last 3 Encounters:  02/19/21 191 lb 2 oz (86.7 kg)  01/14/21 191 lb 8 oz (86.9 kg)  12/04/20 190 lb (86.2 kg)    Physical Exam Vitals and nursing note reviewed.  HENT:     Head: Normocephalic.     Right Ear: Hearing normal.     Left Ear: Hearing normal.     Nose: Nose normal.  Eyes:     Pupils: Pupils are equal, round, and reactive to light.   Pulmonary:     Effort: Pulmonary effort is normal. No respiratory distress.  Neurological:     Mental Status: She is alert.  Psychiatric:        Mood and Affect: Mood normal.        Behavior: Behavior normal.        Thought Content: Thought content normal.        Judgment: Judgment normal.    Results for orders placed or performed in visit on 02/19/21  Comprehensive metabolic panel  Result Value Ref Range   Glucose 80 65 - 99 mg/dL   BUN 14 6 - 24 mg/dL   Creatinine, Ser 0.87 0.57 - 1.00 mg/dL   eGFR 79 >59 mL/min/1.73   BUN/Creatinine Ratio 16 9 - 23   Sodium 145 (H) 134 - 144 mmol/L   Potassium 5.2 3.5 - 5.2 mmol/L   Chloride 104 96 - 106 mmol/L   CO2 23 20 - 29 mmol/L   Calcium 9.8 8.7 - 10.2 mg/dL   Total Protein 7.2 6.0 - 8.5 g/dL   Albumin 4.3 3.8 - 4.9 g/dL   Globulin, Total 2.9 1.5 - 4.5 g/dL   Albumin/Globulin Ratio 1.5 1.2 - 2.2   Bilirubin Total 0.3 0.0 - 1.2 mg/dL   Alkaline Phosphatase 119 44 - 121 IU/L   AST 22 0 - 40 IU/L   ALT 21 0 - 32 IU/L  TSH  Result Value Ref Range   TSH 1.130 0.450 -  4.500 uIU/mL  Iron, TIBC and Ferritin Panel  Result Value Ref Range   Total Iron Binding Capacity 352 250 - 450 ug/dL   UIBC 273 131 - 425 ug/dL   Iron 79 27 - 159 ug/dL   Iron Saturation 22 15 - 55 %   Ferritin 59 15 - 150 ng/mL  CBC With Differential/Platelet  Result Value Ref Range   WBC 7.6 3.4 - 10.8 x10E3/uL   RBC 4.43 3.77 - 5.28 x10E6/uL   Hemoglobin 14.8 11.1 - 15.9 g/dL   Hematocrit 42.6 34.0 - 46.6 %   MCV 96 79 - 97 fL   MCH 33.4 (H) 26.6 - 33.0 pg   MCHC 34.7 31.5 - 35.7 g/dL   RDW 15.5 (H) 11.7 - 15.4 %   Platelets 249 150 - 450 x10E3/uL   Neutrophils 73 Not Estab. %   Lymphs 20 Not Estab. %   MID 7 Not Estab. %   Neutrophils Absolute 5.6 1.4 - 7.0 x10E3/uL   Lymphocytes Absolute 1.5 0.7 - 3.1 x10E3/uL   MID (Absolute) 0.5 0.1 - 1.6 X10E3/uL      Assessment & Plan:   Problem List Items Addressed This Visit       Respiratory    Centrilobular emphysema (HCC) - Primary    Chronic, uncontrolled.  Appointment was supposed to be office visit but patient rescheduled as a virtual.  Need spirometry on patient. Continue with Breztri inhaler.  Discussed use of albuterol PRN for SOB.  Follow up in 6 months for reevaluation.  Patient smokes about 4 packs of cigarettes daily.  Not ready to quit.       Relevant Medications   STIOLTO RESPIMAT 2.5-2.5 MCG/ACT AERS     Other   RLS (restless legs syndrome)    Chronic. Ongoing.  Continue with Requip 62m 5x daily.  Patient denies concerns at visit today. Follow up in 6 months for reevaluation.         Follow up plan: Return in about 6 months (around 10/18/2021) for COPD and RLS.    This visit was completed via MyChart due to the restrictions of the COVID-19 pandemic. All issues as above were discussed and addressed. Physical exam was done as above through visual confirmation on MyChart. If it was felt that the patient should be evaluated in the office, they were directed there. The patient verbally consented to this visit. Location of the patient: Home Location of the provider: Office Those involved with this call:  Provider: KJon Billings NP CMA: DIrena Reichmann CEhrhardtDesk/Registration: BRoe RutherfordThis encounter was conducted via video.  I spent 15 dedicated to the care of this patient on the date of this encounter to include previsit review of 20, face to face time with the patient, and post visit ordering of testing.

## 2021-04-18 ENCOUNTER — Telehealth: Payer: Self-pay

## 2021-04-18 ENCOUNTER — Other Ambulatory Visit: Payer: Self-pay | Admitting: Nurse Practitioner

## 2021-04-18 MED ORDER — FLUTICASONE PROPIONATE 50 MCG/ACT NA SUSP: NASAL | 2 refills | Status: DC

## 2021-04-18 MED ORDER — ALBUTEROL SULFATE HFA 108 (90 BASE) MCG/ACT IN AERS: 2.0000 | INHALATION_SPRAY | Freq: Four times a day (QID) | RESPIRATORY_TRACT | 2 refills | Status: DC | PRN

## 2021-04-18 NOTE — Telephone Encounter (Signed)
Copied from CRM [id number]. Topic: Quick Communication - Rx Refill/Question >> Apr 18, 2021  9:58 AM Marylen Ponto wrote: Medication: albuterol (VENTOLIN HFA) 108 (90 Base) MCG/ACT inhaler and fluticasone (FLONASE) 50 MCG/ACT nasal spray  Has the patient contacted their pharmacy? Yes.   (Agent: If no, request that the patient contact the pharmacy for the refill.) (Agent: If yes, when and what did the pharmacy advise?)  Preferred Pharmacy (with phone number or street name): [facility] - [place], [place] - [address] Phone: [phone]  Fax: [phone]  Agent: Please be advised that RX refills may take up to 3 business days. We ask that you follow-up with your pharmacy.

## 2021-04-18 NOTE — Telephone Encounter (Signed)
PA for Sumatriptan was denied

## 2021-04-18 NOTE — Telephone Encounter (Signed)
PA initiated on CoverMyMeds for Sumatriptan.  KEY [id number]

## 2021-04-19 ENCOUNTER — Institutional Professional Consult (permissible substitution): Payer: Medicaid Other | Admitting: Pulmonary Disease

## 2021-04-19 NOTE — Telephone Encounter (Signed)
She has been on this for awhile. Did they list alternatives?

## 2021-04-19 NOTE — Telephone Encounter (Signed)
Can we call patient and tell her it was denied despite her being on it for a long time.  Can you ask her what else she has tried and see if it worked or didn't work so I can either appeal it or give her another alternative?

## 2021-04-20 ENCOUNTER — Other Ambulatory Visit: Payer: Self-pay | Admitting: Nurse Practitioner

## 2021-04-20 NOTE — Telephone Encounter (Signed)
Requested medication (s) are due for refill today: no  Requested medication (s) are on the active medication list: hx med  Last refill:  04/17/21  Future visit scheduled: no  Notes to clinic:  historical provider- med not assigned to a protocol   Requested Prescriptions  Pending Prescriptions Disp Refills   STIOLTO RESPIMAT 2.5-2.5 MCG/ACT AERS [Pharmacy Med Name: STIOLTO RESPIMAT 2.5/2.5MCG INH 4GM] 4 g     Sig: INHALE 2 PUFFS INTO THE LUNGS DAILY      Off-Protocol Failed - 04/20/2021 12:06 PM      Failed - Medication not assigned to a protocol, review manually.      Passed - Valid encounter within last 12 months    Recent Outpatient Visits           3 days ago Centrilobular emphysema (HCC)   [facility] Larae Grooms, NP   1 month ago Pulmonary emphysema, unspecified emphysema type (HCC)   [facility] Larae Grooms, NP   2 months ago Centrilobular emphysema (HCC)   [facility] [place] T, NP   3 months ago Chronic obstructive pulmonary disease, unspecified COPD type (HCC)   [facility] Larae Grooms, NP   6 months ago Rib pain   [facility] Dorcas Carrow, DO       Future Appointments             In 6 months Larae Grooms, NP [facility]

## 2021-04-22 NOTE — Telephone Encounter (Signed)
Spoke with CoverMyMeds at [phone] and they informed us that the PA was denied due to the max quantity is 6 mL per every 23 days and patient newest prescription was for 10 mL per every 23 days. Pharmacist informed our office that patient still has an active prescription on file that is good until October 27th. Attempted to reach out to patient and informed her. Left detailed message for patient to give our office call back if she has anything questions or concerns.

## 2021-04-29 ENCOUNTER — Other Ambulatory Visit: Payer: Self-pay

## 2021-04-29 ENCOUNTER — Other Ambulatory Visit: Payer: Self-pay | Admitting: *Deleted

## 2021-04-29 NOTE — Patient Outreach (Signed)
Medicaid Managed Care   Nurse Care Manager Note  04/29/2021 Name:  Naryah Clenney MRN:  [id number] DOB:  Feb 02, 1966  Celine Mans Griffitts is an 55 y.o. year old female who is a primary patient of Larae Grooms, NP.  The [facility] Managed Care Coordination team was consulted for assistance with:    COPD  Ms. Epperly was given information about Medicaid Managed Care Coordination team services today. Celine Mans Zelada Patient agreed to services and verbal consent obtained.  Engaged with patient by telephone for follow up visit in response to provider referral for case management and/or care coordination services.   Assessments/Interventions:  Review of past medical history, allergies, medications, health status, including review of consultants reports, laboratory and other test data, was performed as part of comprehensive evaluation and provision of chronic care management services.  SDOH (Social Determinants of Health) assessments and interventions performed:   Care Plan  Allergies  Allergen Reactions   Penicillins Other (See Comments)    Unknown childhood reaction   Sulfa Antibiotics Swelling    Medications Reviewed Today     Reviewed by Heidi Dach, RN (Registered Nurse) on 04/29/21 at 1111  Med List Status: <None>   Medication Order Taking? Sig Documenting Provider Last Dose Status Informant  albuterol (VENTOLIN HFA) 108 (90 Base) MCG/ACT inhaler [id number]  Inhale 2 puffs into the lungs every 6 (six) hours as needed for wheezing or shortness of breath. Larae Grooms, NP  Active   fluticasone [facility]) 50 MCG/ACT nasal spray [id number]  SHAKE LIQUID AND USE 1 SPRAY IN EACH NOSTRIL DAILY Larae Grooms, NP  Active   ondansetron (ZOFRAN-ODT) 8 MG disintegrating tablet [id number]  Take 1 tablet (8 mg total) by mouth every 8 (eight) hours as needed for nausea or vomiting. Larae Grooms, NP  Active   rOPINIRole (REQUIP) 4 MG tablet [id number]  TAKE 1 TABLET(4 MG) BY  MOUTH FIVE TIMES DAILY Larae Grooms, NP  Active   STIOLTO RESPIMAT 2.5-2.5 MCG/ACT AERS [id number]  INHALE 2 PUFFS INTO THE LUNGS DAILY Larae Grooms, NP  Active   SUMAtriptan 6 MG/0.5ML [place] [id number]  INJECT 0.5 ML INTO THE SKIN AT ONSET OF HEADACHE. MAY REPEAT AFTER 1 HOUR IF NO BETTER. MAXIMUM OF 2 DOSES IN 24 HOURS Larae Grooms, NP  Active             Patient Active Problem List   Diagnosis Date Noted   Rectal bleeding 02/19/2021   History of ileus 02/19/2021   Aortic atherosclerosis (HCC) 01/14/2021   Headache disorder 09/20/2020   Rebound headache 09/20/2020   Lumbar radiculopathy 08/01/2020   Cervical radiculopathy 08/01/2020   Compression fracture of T12 vertebra (HCC) 08/01/2020   PTSD (post-traumatic stress disorder) 04/05/2020   GERD (gastroesophageal reflux disease)    Centrilobular emphysema (HCC) 02/21/2016   RLS (restless legs syndrome) 07/02/2015   Migraines 07/02/2015   Nicotine dependence, cigarettes, w unsp disorders 06/25/2011    Conditions to be addressed/monitored per PCP order:  COPD  Care Plan : COPD (Adult)  Updates made by Heidi Dach, RN since 04/29/2021 12:00 AM     Problem: Disease Progression (COPD)      Goal: Disease Progression Minimized or Managed   Start Date: 04/09/2021  Expected End Date: 07/10/2021  Recent Progress: On track  Priority: High  Note:   Current Barriers:  Knowledge deficits related to basic COPD self care/management-Ms. Moya desires to manage her health at home. She has been diagnosed with COPD and has a consult with  Pulmonology on 04/30/21. Ms. Dismuke is a smoker and smokes 3-4PPD. She does not desire to quit or cut back. Patient wears glasses and contacts, but has not had an eye exam since 2018.-Update-Ms. Lapoint declined referral for Ophthalmology appointment, due to next available appointment being 6 months out. She scheduled with a provider in [place] for 05/16/21. Ms. Golob is aware of Pulmonology  appointment 8/16 and plans to attend. She has her own transportation. She is concerned re: insurance declining sumatriptan coverage. Per patient, this medication is the only medication that works for her migraines. Limited Social Support Does not adhere to provider recommendations re: smoking cessation Does not maintain contact with provider office Does not contact provider office for questions/concerns  Case Manager Clinical Goal(s): patient will verbalize basic understanding of COPD disease process and self care activities  Patient will attend pulmonology consult on 04/30/21 Interventions:  Inter-disciplinary care team collaboration (see longitudinal plan of care) Provided patient with basic written and verbal COPD education on self care/management/and exacerbation prevention  Provided patient with COPD action plan and reinforced importance of daily self assessment Advised patient to call PCP to provide information re: sumatriptan and what medications she has tried in the past Reviewed upcoming appointments  Patient Goals/Self-Care Activities:  - keep scheduled appointment with PCP and Pulmonlogy - work with MM Pharmacist, Harrold Donath for medication management - follow up with requested referral for ophthalmology - begin a symptom diary - keep follow-up appointments  Follow Up Plan: Telephone follow up appointment with care management team member scheduled for:05/17/21 @ 9am      Follow Up:  Patient agrees to Care Plan and Follow-up.  Plan: The Managed Medicaid care management team will reach out to the patient again over the next 15 days.  Date/time of next scheduled RN care management/care coordination outreach:  05/17/21 @ 9am  [name] RN, BSN [place]  [employer]

## 2021-04-29 NOTE — Patient Instructions (Signed)
Visit Information  Ms. Parma was given information about Medicaid Managed Care team care coordination services as a part of their [facility] Community Plan Medicaid benefit. Celine Mans Oesterle verbally consented to engagement with the [facility] Managed Care team.   If you are experiencing a medical emergency, please call 911 or report to your local emergency department or urgent care.   If you have a non-emergency medical problem during routine business hours, please contact your provider's office and ask to speak with a nurse.   For questions related to your [facility], please call: [phone] or visit the homepage here: [email or web address]  If you would like to schedule transportation through your [facility], please call the following number at least 2 days in advance of your appointment: [phone].   Call the Behavioral Health Crisis Line at [phone], at any time, 24 hours a day, 7 days a week. If you are in danger or need immediate medical attention call 911.  If you would like help to quit smoking, call 1-800-QUIT-NOW (425-704-4405) OR Espaol: 1-855-Djelo-Ya (3-295-188-4166) o para ms informacin haga clic aqu or Text READY to 063-016 to register via text  Ms. Bedgood - following are the goals we discussed in your visit today:   Goals Addressed             This Visit's Progress    Track and Manage My Symptoms-COPD       Timeframe:  Long-Range Goal Priority:  High Start Date:  04/09/21                           Expected End Date: 07/10/21                      Follow Up Date 05/17/21    - keep scheduled appointment with PCP and Pulmonlogy - work with [facility] Pharmacist, Harrold Donath for medication management - follow up with requested referral for ophthalmology - begin a symptom diary - keep follow-up appointments    Why is this important?   Tracking your  symptoms and other information about your health helps your doctor plan your care.  Write down the symptoms, the time of day, what you were doing and what medicine you are taking.  You will soon learn how to manage your symptoms.             Please see education materials related to COPD, migraines and smoking provided by MyChart link.  Patient verbalizes understanding of instructions provided today.   Telephone follow up appointment with Managed Medicaid care management team member scheduled for:05/17/21 @ 9am  [name] RN, BSN [employer]  [facility] RN Care Coordinator   Following is a copy of your plan of care:  Patient Care Plan: COPD (Adult)     Problem Identified: Disease Progression (COPD)      Goal: Disease Progression Minimized or Managed   Start Date: 04/09/2021  Expected End Date: 07/10/2021  Recent Progress: On track  Priority: High  Note:   Current Barriers:  Knowledge deficits related to basic COPD self care/management-Ms. Saltos desires to manage her health at home. She has been diagnosed with COPD and has a consult with Pulmonology on 04/30/21. Ms. Flanigan is a smoker and smokes 3-4PPD. She does not desire to quit or cut back. Patient wears glasses and contacts, but has not had an eye exam since 2018.-Update-Ms. Eklund declined referral for  Ophthalmology appointment, due to next available appointment being 6 months out. She scheduled with a provider in [place] for 05/16/21. Ms. Diggins is aware of Pulmonology appointment 8/16 and plans to attend. She has her own transportation. She is concerned re: insurance declining sumatriptan coverage. Per patient, this medication is the only medication that works for her migraines. Limited Social Support Does not adhere to provider recommendations re: smoking cessation Does not maintain contact with provider office Does not contact provider office for questions/concerns  Case Manager Clinical Goal(s): patient  will verbalize basic understanding of COPD disease process and self care activities  Patient will attend pulmonology consult on 04/30/21 Interventions:  Inter-disciplinary care team collaboration (see longitudinal plan of care) Provided patient with basic written and verbal COPD education on self care/management/and exacerbation prevention  Provided patient with COPD action plan and reinforced importance of daily self assessment Advised patient to call PCP to provide information re: sumatriptan and what medications she has tried in the past Reviewed upcoming appointments  Patient Goals/Self-Care Activities:  - keep scheduled appointment with PCP and Pulmonlogy - work with MM Pharmacist, Harrold Donath for medication management - follow up with requested referral for ophthalmology - begin a symptom diary - keep follow-up appointments  Follow Up Plan: Telephone follow up appointment with care management team member scheduled for:05/17/21 @ 9am     Patient Care Plan: Medication Management     Problem Identified: Health Promotion or Disease Self-Management (General Plan of Care)      Goal: Medication Management   Note:   Current Barriers:  Does not adhere to prescribed medication regimen Does not maintain contact with provider office Does not contact provider office for questions/concerns   Pharmacist Clinical Goal(s):  Over the next 180 days, patient will contact provider office for questions/concerns as evidenced notation of same in electronic health record through collaboration with PharmD and provider.    Interventions: Inter-disciplinary care team collaboration (see longitudinal plan of care) Comprehensive medication review performed; medication list updated in electronic medical record    Patient Goals/Self-Care Activities Over the next 180 days, patient will:  - take medications as prescribed collaborate with provider on medication access solutions  Follow Up Plan: The patient  has been provided with contact information for the care management team and has been advised to call with any health related questions or concerns.

## 2021-04-30 ENCOUNTER — Telehealth: Payer: Self-pay

## 2021-04-30 ENCOUNTER — Encounter: Payer: Self-pay | Admitting: Internal Medicine

## 2021-04-30 ENCOUNTER — Ambulatory Visit (INDEPENDENT_AMBULATORY_CARE_PROVIDER_SITE_OTHER): Payer: Medicaid Other | Admitting: Internal Medicine

## 2021-04-30 ENCOUNTER — Other Ambulatory Visit: Payer: Self-pay

## 2021-04-30 DIAGNOSIS — F17210 Nicotine dependence, cigarettes, uncomplicated: Secondary | ICD-10-CM | POA: Diagnosis not present

## 2021-04-30 DIAGNOSIS — J449 Chronic obstructive pulmonary disease, unspecified: Secondary | ICD-10-CM | POA: Diagnosis not present

## 2021-04-30 DIAGNOSIS — R058 Other specified cough: Secondary | ICD-10-CM

## 2021-04-30 DIAGNOSIS — R918 Other nonspecific abnormal finding of lung field: Secondary | ICD-10-CM

## 2021-04-30 MED ORDER — ACETAMINOPHEN-CODEINE #3 300-30 MG PO TABS: 1.0000 | ORAL_TABLET | ORAL | 0 refills | Status: DC | PRN

## 2021-04-30 MED ORDER — STIOLTO RESPIMAT 2.5-2.5 MCG/ACT IN AERS: 2.0000 | INHALATION_SPRAY | Freq: Every day | RESPIRATORY_TRACT | 0 refills | Status: DC

## 2021-04-30 MED ORDER — AZITHROMYCIN 250 MG PO TABS: ORAL_TABLET | ORAL | 0 refills | Status: DC

## 2021-04-30 MED ORDER — FAMOTIDINE 20 MG PO TABS: ORAL_TABLET | ORAL | 11 refills | Status: DC

## 2021-04-30 MED ORDER — PANTOPRAZOLE SODIUM 40 MG PO TBEC: 40.0000 mg | DELAYED_RELEASE_TABLET | Freq: Every day | ORAL | 2 refills | Status: DC

## 2021-04-30 MED ORDER — PREDNISONE 10 MG PO TABS: ORAL_TABLET | ORAL | 0 refills | Status: DC

## 2021-04-30 NOTE — Patient Instructions (Addendum)
For cough >  mucinex dm  1200 mg every 12 hours and supplement tylenol #3  1-2 every 4 hours until cough is better controlled for at least 3 days   Pantoprazole (protonix) 40 mg   Take  30-60 min before first meal of the day and Pepcid (famotidine)  20 mg after supper until return to office - this is the best way to tell whether stomach acid is contributing to your problem.    Continues stiolto 2 puffs each am (it can't hurt)   Only use your albuterol as a rescue medication to be used if you can't catch your breath by resting or doing a relaxed purse lip breathing pattern.  - The less you use it, the better it will work when you need it. - Ok to use up to 2 puffs  every 4 hours if you must but call for immediate appointment if use goes up over your usual need - Don't leave home without it !!  (think of it like the spare tire for your car)    Prednisone 10 mg take  4 each am x 2 days,   2 each am x 2 days,  1 each am x 2 days and stop   Zpak   GERD (REFLUX)  is an extremely common cause of respiratory symptoms just like yours , many times with no obvious heartburn at all.    It can be treated with medication, but also with lifestyle changes including elevation of the head of your bed (ideally with 6 -8inch blocks under the headboard of your bed),  Smoking cessation, avoidance of late meals, excessive alcohol, and avoid fatty foods, chocolate, peppermint, colas, red wine, and acidic juices such as orange juice.  NO MINT OR MENTHOL PRODUCTS SO NO COUGH DROPS  USE SUGARLESS CANDY INSTEAD (Jolley ranchers or Stover's or Life Savers) or even ice chips will also do - the key is to swallow to prevent all throat clearing. NO OIL BASED VITAMINS - use powdered substitutes.  Avoid fish oil when coughing.   The key is to stop smoking completely before smoking completely stops you!    Please schedule a follow up office visit Sept 9th  call sooner if needed with all medications /inhalers/ solutions in  hand so we can verify exactly what you are taking. This includes all medications from all doctors and over the counters

## 2021-04-30 NOTE — Progress Notes (Signed)
Kim [name] [place], female    DOB: 02-01-1966    MRN: [id number]   Brief patient profile:  16 yowf active smoker with cough x 2018 developed while in [place]  referred to pulmonary clinic 04/30/2021 by Kim [name]  for eval of Lung nodule new since 11/2020      History of Present Illness  04/30/2021  Pulmonary/ 1st office eval/ Kim [name] / [facility] Complaint  Patient presents with   Consult    Lung nodule- coughing with phlegm (yellow and clear occasionally has bloody phlegm)  ,wheezing.  Dyspnea:  worse x 2 gradually to point where struggle to get to [facility] and back, struggles to unload groceries, has [facility] parking  Cough: x one year p lies down every night x ropy with green / sometimes bloody then vomit  Sleep: bed is flat with 3 pillows  SABA use: no need / on stiolto then breztri and worse on latter  Gen cp from coughing  Very allergic to "a cousin of pcn" given years ago at [facility] "swelled all over"    No obvious day to day or daytime variability or assoc  mucus plugs cp or chest tightness, subjective wheeze or overt sinus or hb symptoms.     Also denies any obvious fluctuation of symptoms with weather or environmental changes or other aggravating or alleviating factors except as outlined above   No unusual exposure hx or h/o childhood pna/ asthma or knowledge of premature birth.  Current Allergies, Complete Past Medical History, Past Surgical History, Family History, and Social History were reviewed in [employer] record.  ROS  The following are not active complaints unless bolded Hoarseness, sore throat, dysphagia, dental problems, itching, sneezing,  nasal congestion or discharge of excess mucus or purulent secretions, ear ache,   fever, chills, sweats, unintended wt loss or wt gain, classically pleuritic or exertional cp,  orthopnea pnd or arm/hand swelling  or leg swelling, presyncope, palpitations, abdominal pain, anorexia, nausea, vomiting,  diarrhea  or change in bowel habits or change in bladder habits, change in stools or change in urine, dysuria, hematuria,  rash, arthralgias, visual complaints, headache, numbness, weakness or ataxia or problems with walking or coordination,  change in mood or  memory.            Past Medical History:  Diagnosis Date   Arthritis    Bipolar disorder (HCC)    COPD (chronic obstructive pulmonary disease) (HCC)    GERD (gastroesophageal reflux disease)    Leg pain    bilateral   Migraine    Restless leg syndrome     Outpatient Medications Prior to Visit  Medication Sig Dispense Refill   albuterol (VENTOLIN HFA) 108 (90 Base) MCG/ACT inhaler Inhale 2 puffs into the lungs every 6 (six) hours as needed for wheezing or shortness of breath. 18 g 2   fluticasone (FLONASE) 50 MCG/ACT nasal spray SHAKE LIQUID AND USE 1 SPRAY IN EACH NOSTRIL DAILY 16 g 2   ondansetron (ZOFRAN-ODT) 8 MG disintegrating tablet Take 1 tablet (8 mg total) by mouth every 8 (eight) hours as needed for nausea or vomiting. 90 tablet 2   rOPINIRole (REQUIP) 4 MG tablet TAKE 1 TABLET(4 MG) BY MOUTH FIVE TIMES DAILY 150 tablet 1   STIOLTO RESPIMAT 2.5-2.5 MCG/ACT AERS INHALE 2 PUFFS INTO THE LUNGS DAILY 4 g 1   SUMAtriptan 6 MG/0.5ML SOAJ INJECT 0.5 ML INTO THE SKIN AT ONSET OF HEADACHE. MAY REPEAT AFTER 1 HOUR  IF NO BETTER. MAXIMUM OF 2 DOSES IN 24 HOURS 10 mL 1   No facility-administered medications prior to visit.     Objective:     BP 110/82 (BP Location: Left Arm, Patient Position: Sitting, Cuff Size: Normal)   Pulse 74   Temp (!) 97.4 F (36.3 C) (Oral)   Ht 5\' 6"  (1.676 m)   Wt 198 lb 9.6 oz (90.1 kg)   SpO2 98%   BMI 32.05 kg/m   SpO2: 98 %  Hoarse amb wf / congested cough    HEENT : pt wearing mask not removed for exam due to covid -19 concerns.    NECK :  without JVD/Nodes/TM/ nl carotid upstrokes bilaterally   LUNGS: no acc muscle use,  Nl contour chest which is clear to A and P bilaterally  without cough on insp or exp maneuvers   CV:  RRR  no s3 or murmur or increase in P2, and no edema   ABD:  soft and nontender with nl inspiratory excursion in the supine position. No bruits or organomegaly appreciated, bowel sounds nl  MS:  Nl gait/ ext warm without deformities, calf tenderness, cyanosis or clubbing No obvious joint restrictions   SKIN: warm and dry without lesions    NEURO:  alert, approp, nl sensorium with  no motor or cerebellar deficits apparent.    I personally reviewed images and agree with radiology impression as follows:   Chest CT LDSCT  03/07/21  Lungs/Pleura: Advanced changes of paraseptal and moderate changes of centrilobular emphysema identified bilaterally. No signs of pleural effusion, atelectasis, or pneumothorax. There is a new subpleural area of airspace consolidation within the anterior basal right upper lobe abutting the major fissure. This area has a mean derived diameter of 17.8 mm. Subsegmental atelectasis or scarring with volume loss in the medial right middle lobe is noted. Previously noted pulmonary nodules are stable.    Assessment   Multiple pulmonary nodules determined by computed tomography of lung 03/07/21 (vs 12/04/20) new subpleural area of airspace consolidation within the anterior basal right upper lobe abutting the major fissure. This area has a mean derived diameter of 17.8 mm  Of all her problems this is of the least concern at present and is likely inflammatory as absent 3 m ago   Discussed in detail all the  indications, usual  risks and alternatives  relative to the benefits with patient who agrees to proceed with conservative f/u with another scan in another 3 months    COPD GOLD ? / active smoker Active smoker - 04/30/2021  After extensive coaching inhaler device,  effectiveness =    90% with smi > resume stiolto  I don't believe she's actually having aecopd as is more of Pt is Group B in terms of symptom/risk and  laba/lama therefore appropriate rx at this point >>>  stiolto approp  Re  Saba: I spent extra time with pt today reviewing appropriate use of albuterol for prn use on exertion with the following points: 1) saba is for relief of sob that does not improve by walking a slower pace or resting but rather if the pt does not improve after trying this first. 2) If the pt is convinced, as many are, that saba helps recover from activity faster then it's easy to tell if this is the case by re-challenging : ie stop, take the inhaler, then p 5 minutes try the exact same activity (intensity of workload) that just caused the symptoms and see if  they are substantially diminished or not after saba 3) if there is an activity that reproducibly causes the symptoms, try the saba 15 min before the activity on alternate days   If in fact the saba really does help, then fine to continue to use it prn but advised may need to look closer at the maintenance regimen being used to achieve better control of airways disease with exertion.       Upper airway cough syndrome Onset 2021  - cyclical cough rx  04/30/2021 >>>  Of the three most common causes of  Sub-acute / recurrent or chronic cough, only one (GERD)  can actually contribute to/ trigger  the other two (asthma and post nasal drip syndrome)  and perpetuate the cylce of cough.  While not intuitively obvious, many patients with chronic low grade reflux do not cough until there is a primary insult that disturbs the protective epithelial barrier and exposes sensitive nerve endings.   This is typically viral but can due to PNDS and  either may apply here.   The point is that once this occurs, it is difficult to eliminate the cycle  using anything but a maximally effective acid suppression regimen at least in the short run, accompanied by an appropriate diet to address non acid GERD and control / eliminate the cough itself for at least 3 days with codeine  >>> also so added 6  day taper off  Prednisone starting at 40 mg per day in case of component of Th-2 driven upper or lower airways inflammation (if cough responds short term only to relapse before return while will on full rx for uacs (as above), then  that would point to allergic rhinitis/ asthma or eos bronchitis as alternative dx)   zpak since appears to have failed doxy and allergic to pcn and "it's cousin"           Each maintenance medication was reviewed in detail including emphasizing most importantly the difference between maintenance and prns and under what circumstances the prns are to be triggered using an action plan format where appropriate.  Total time for H and P, chart review, counseling, reviewing smi/hfa device(s) and generating customized AVS unique to this office visit / same day charting = > 60 min        Cigarette smoker 4-5 min discussion re active cigarette smoking in addition to office E&M  Ask about tobacco use:  Ongoing  Advise quitting   I took an extended  opportunity with this patient to outline the consequences of continued cigarette use  in airway disorders based on all the data we have from the multiple national lung health studies (perfomed over decades at millions of dollars in cost)  indicating that smoking cessation, not choice of inhalers or physicians, is the most important aspect of her care.   Assess willingness:  Not committed at this point Assist in quit attempt:  Per PCP when ready Arrange follow up:   Follow up per Primary Care planned  For smoking cessation classes call [phone]        Sandrea Hughs, MD 04/30/2021

## 2021-05-01 ENCOUNTER — Telehealth: Payer: Self-pay | Admitting: Internal Medicine

## 2021-05-01 ENCOUNTER — Encounter: Payer: Self-pay | Admitting: Internal Medicine

## 2021-05-01 ENCOUNTER — Telehealth: Payer: Self-pay

## 2021-05-01 DIAGNOSIS — R918 Other nonspecific abnormal finding of lung field: Secondary | ICD-10-CM

## 2021-05-01 DIAGNOSIS — F17210 Nicotine dependence, cigarettes, uncomplicated: Secondary | ICD-10-CM | POA: Insufficient documentation

## 2021-05-01 DIAGNOSIS — R058 Other specified cough: Secondary | ICD-10-CM

## 2021-05-01 HISTORY — DX: Nicotine dependence, cigarettes, uncomplicated: F17.210

## 2021-05-01 HISTORY — DX: Other specified cough: R05.8

## 2021-05-01 HISTORY — DX: Other nonspecific abnormal finding of lung field: R91.8

## 2021-05-01 NOTE — Assessment & Plan Note (Signed)
Active smoker - 04/30/2021  After extensive coaching inhaler device,  effectiveness =    90% with smi > resume stiolto  I don't believe she's actually having aecopd as is more of Pt is Group B in terms of symptom/risk and laba/lama therefore appropriate rx at this point >>>  stiolto approp  Re  Kim [name]: I spent extra time with pt today reviewing appropriate use of albuterol for prn use on exertion with the following points: 1) saba is for relief of sob that does not improve by walking a slower pace or resting but rather if the pt does not improve after trying this first. 2) If the pt is convinced, as many are, that saba helps recover from activity faster then it's easy to tell if this is the case by re-challenging : ie stop, take the inhaler, then p 5 minutes try the exact same activity (intensity of workload) that just caused the symptoms and see if they are substantially diminished or not after saba 3) if there is an activity that reproducibly causes the symptoms, try the saba 15 min before the activity on alternate days   If in fact the saba really does help, then fine to continue to use it prn but advised may need to look closer at the maintenance regimen being used to achieve better control of airways disease with exertion.

## 2021-05-01 NOTE — Assessment & Plan Note (Signed)
03/07/21 (vs 12/04/20) new subpleural area of airspace consolidation within the anterior basal right upper lobe abutting the major fissure. This area has a mean derived diameter of 17.8 mm  Of all her problems this is of the least concern at present and is likely inflammatory as absent 3 m ago   Discussed in detail all the  indications, usual  risks and alternatives  relative to the benefits with patient who agrees to proceed with conservative f/u with another scan in another 3 months

## 2021-05-01 NOTE — Telephone Encounter (Signed)
Created in error

## 2021-05-01 NOTE — Assessment & Plan Note (Addendum)
Onset 2021  - cyclical cough rx  04/30/2021 >>>  Of the three most common causes of  Sub-acute / recurrent or chronic cough, only one (GERD)  can actually contribute to/ trigger  the other two (asthma and post nasal drip syndrome)  and perpetuate the cylce of cough.  While not intuitively obvious, many patients with chronic low grade reflux do not cough until there is a primary insult that disturbs the protective epithelial barrier and exposes sensitive nerve endings.   This is typically viral but can due to PNDS and  either may apply here.   The point is that once this occurs, it is difficult to eliminate the cycle  using anything but a maximally effective acid suppression regimen at least in the short run, accompanied by an appropriate diet to address non acid GERD and control / eliminate the cough itself for at least 3 days with codeine  >>> also so added 6 day taper off  Prednisone starting at 40 mg per day in case of component of Th-2 driven upper or lower airways inflammation (if cough responds short term only to relapse before return while will on full rx for uacs (as above), then  that would point to allergic rhinitis/ asthma or eos bronchitis as alternative dx)   zpak since appears to have failed doxy and allergic to pcn and "it's cousin"           Each maintenance medication was reviewed in detail including emphasizing most importantly the difference between maintenance and prns and under what circumstances the prns are to be triggered using an action plan format where appropriate.  Total time for H and P, chart review, counseling, reviewing smi/hfa device(s) and generating customized AVS unique to this office visit / same day charting = > 60 min

## 2021-05-01 NOTE — Telephone Encounter (Signed)
Just FYI  Pt called in very upset about her recent visit with Dr Sherene Sires. Patient states " the notes from the office visit were completely incorrect and that she has never had a provider ever be that rude to her" pt would also like to know if she should keep her upcoming appointment. I expressed my apologies  and stated that it is totally up to her if she would like to keep this appointment. Patient states she is willing to try all medications he prescribed.  Patient stated she will come to next appointment on 9/9

## 2021-05-01 NOTE — Assessment & Plan Note (Signed)
4-5 min discussion re active cigarette smoking in addition to office E&M  Ask about tobacco use:  Ongoing  Advise quitting   I took an extended  opportunity with this patient to outline the consequences of continued cigarette use  in airway disorders based on all the data we have from the multiple national lung health studies (perfomed over decades at millions of dollars in cost)  indicating that smoking cessation, not choice of inhalers or physicians, is the most important aspect of her care.   Assess willingness:  Not committed at this point Assist in quit attempt:  Per PCP when ready Arrange follow up:   Follow up per Primary Care planned  For smoking cessation classes call [phone]

## 2021-05-02 ENCOUNTER — Telehealth: Payer: Self-pay | Admitting: *Deleted

## 2021-05-02 NOTE — Patient Outreach (Signed)
Care Coordination  05/02/2021  Murel Wigle [facility] [birth year] [id number]  RNCM returning call to Ms. Tobey. RNCM LM, including contact information. RNCM will reach out to patient again at next scheduled telephone outreach  [name] RN, BSN [place]  [facility] RN Care Coordinator

## 2021-05-03 ENCOUNTER — Ambulatory Visit: Payer: Medicaid Other

## 2021-05-08 DIAGNOSIS — F314 Bipolar disorder, current episode depressed, severe, without psychotic features: Secondary | ICD-10-CM | POA: Diagnosis not present

## 2021-05-08 DIAGNOSIS — F4312 Post-traumatic stress disorder, chronic: Secondary | ICD-10-CM | POA: Diagnosis not present

## 2021-05-08 DIAGNOSIS — G4700 Insomnia, unspecified: Secondary | ICD-10-CM | POA: Diagnosis not present

## 2021-05-13 ENCOUNTER — Other Ambulatory Visit: Payer: Self-pay | Admitting: Nurse Practitioner

## 2021-05-13 ENCOUNTER — Other Ambulatory Visit: Payer: Self-pay | Admitting: Internal Medicine

## 2021-05-13 NOTE — Telephone Encounter (Signed)
Requested Prescriptions  Pending Prescriptions Disp Refills  . rOPINIRole (REQUIP) 4 MG tablet [Pharmacy Med Name: ropinirole 4 mg tablet] 150 tablet 2    Sig: TAKE ONE TABLET BY MOUTH FIVE TIMES DAILY     Neurology:  Parkinsonian Agents Passed - 05/13/2021  3:33 PM      Passed - Last BP in normal range    BP Readings from Last 1 Encounters:  04/30/21 110/82         Passed - Valid encounter within last 12 months    Recent Outpatient Visits          3 weeks ago Centrilobular emphysema (HCC)   [facility] Larae Grooms, NP   2 months ago Pulmonary emphysema, unspecified emphysema type (HCC)   [facility] Larae Grooms, NP   2 months ago Centrilobular emphysema (HCC)   [facility] [place], [place] T, NP   3 months ago Chronic obstructive pulmonary disease, unspecified COPD type (HCC)   [facility] Larae Grooms, NP   7 months ago Rib pain   [facility] [place], Oralia Rud, DO      Future Appointments            In 1 week Sherene Sires, Charlaine Dalton, MD [facility] [place]   In 5 months Larae Grooms, NP [facility]           . fluticasone (FLONASE) 50 MCG/ACT nasal spray [Pharmacy Med Name: fluticasone propionate 50 mcg/actuation nasal spray,suspension] 16 g 2    Sig: shake liquid AND INSTILL 1 SPRAY IN EACH NOSTRIL DAILY     Ear, Nose, and Throat: Nasal Preparations - Corticosteroids Passed - 05/13/2021  3:33 PM      Passed - Valid encounter within last 12 months    Recent Outpatient Visits          3 weeks ago Centrilobular emphysema (HCC)   [facility] Larae Grooms, NP   2 months ago Pulmonary emphysema, unspecified emphysema type (HCC)   [facility] Larae Grooms, NP   2 months ago Centrilobular emphysema (HCC)   [facility] [place], [place] T, NP   3 months ago Chronic obstructive pulmonary disease, unspecified COPD type (HCC)    [facility] Larae Grooms, NP   7 months ago Rib pain   [facility] [place], Oralia Rud, DO      Future Appointments            In 1 week Sherene Sires, Charlaine Dalton, MD [facility] [place]   In 5 months Larae Grooms, NP [facility], [facility]

## 2021-05-16 DIAGNOSIS — S22000A Wedge compression fracture of unspecified thoracic vertebra, initial encounter for closed fracture: Secondary | ICD-10-CM | POA: Diagnosis not present

## 2021-05-16 DIAGNOSIS — R32 Unspecified urinary incontinence: Secondary | ICD-10-CM | POA: Diagnosis not present

## 2021-05-17 ENCOUNTER — Other Ambulatory Visit: Payer: Self-pay | Admitting: *Deleted

## 2021-05-17 NOTE — Patient Outreach (Signed)
Care Coordination  05/17/2021  Mikenna Bunkley [facility] 1965-11-04 [id number]   Medicaid Managed Care   Unsuccessful Outreach Note  05/17/2021 Name: Kim [name] MRN: [id number] DOB: February 21, 1966  Referred by: Larae Grooms, NP Reason for referral : High Risk Managed Medicaid (Unsuccessful RNCM follow up outreach)   An unsuccessful telephone outreach was attempted today. The patient was referred to the case management team for assistance with care management and care coordination.   Follow Up Plan: A HIPAA compliant phone message was left for the patient providing contact information and requesting a return call.   [name] RN, BSN [place]  [facility]

## 2021-05-17 NOTE — Patient Instructions (Signed)
Visit Information  [name]  - as a part of your Medicaid benefit, you are eligible for care management and care coordination services at no cost or copay. I was unable to reach you by phone today but would be happy to help you with your health related needs. Please feel free to call me @ [phone].   A member of the Managed Medicaid care management team will reach out to you again over the next 14 days.   Kim Bieri RN, BSN [place]  [facility] RN Care Coordinator   

## 2021-05-22 ENCOUNTER — Other Ambulatory Visit: Payer: Self-pay | Admitting: Internal Medicine

## 2021-05-22 NOTE — Telephone Encounter (Signed)
Please send refill if appropriate. Pt has appt pending 05/24/21. Thanks!

## 2021-05-23 NOTE — Telephone Encounter (Signed)
Has apt 05/24/21 so can discuss then

## 2021-05-24 ENCOUNTER — Ambulatory Visit: Payer: Medicaid Other | Admitting: Internal Medicine

## 2021-05-28 ENCOUNTER — Ambulatory Visit: Payer: Medicaid Other | Admitting: Internal Medicine

## 2021-05-30 ENCOUNTER — Other Ambulatory Visit: Payer: Self-pay | Admitting: *Deleted

## 2021-05-30 ENCOUNTER — Other Ambulatory Visit: Payer: Self-pay

## 2021-05-30 NOTE — Patient Instructions (Signed)
Visit Information  Kim Kim [name] was given information about Medicaid Managed Care team care coordination services as a part of their [place] Medicaid benefit. Kim Kim [name] verbally consented to engagement with the [facility] Managed Care team.   If you are experiencing a medical emergency, please call 911 or report to your local emergency department or urgent care.   If you have a non-emergency medical problem during routine business hours, please contact your provider's office and ask to speak with a nurse.   For questions related to your [facility], please call: [phone] or visit the homepage here: [email or web address]  If you would like to schedule transportation through your [facility], please call the following number at least 2 days in advance of your appointment: [phone].   Call the [place] at [phone], at any time, 24 hours a day, 7 days a week. If you are in danger or need immediate medical attention call 911.  If you would like help to quit smoking, call 1-800-QUIT-NOW 9736884089) OR Espaol: 1-855-Djelo-Ya (5-003-704-8889) o para ms informacin haga clic aqu or Text READY to 200-400 to register via text  Kim Kim [name] - following are the goals we discussed in your visit today:   Goals Addressed             This Visit's Progress    Track and Manage My Symptoms-COPD       Timeframe:  Long-Range Goal Priority:  High Start Date:  04/09/21                           Expected End Date: 07/10/21                      Follow Up Date 06/14/21    - schedule in person PCP appointment - request a second opinion for Pulmonology - work on cutting back on smoking  - follow up with requested referral for ophthalmology - begin a symptom diary - keep follow-up appointments    Why Kim [name] this important?   Tracking your  symptoms and other information about your health helps your doctor plan your care.  Write down the symptoms, the time of day, what you were doing and what medicine you are taking.  You will soon learn how to manage your symptoms.             Please see education materials related to migraines and smoking cessation provided by MyChart link.  Patient has access to MyChart and can view provided information  Telephone follow up appointment with Managed Medicaid care management team member scheduled for:06/14/21 @ 10:30am  Lurena Joiner RN, BSN [place] RN Care Coordinator   Following Kim [name] a copy of your plan of care:  Patient Care Plan: COPD (Adult)     Problem Identified: Disease Progression (COPD)      Goal: Disease Progression Minimized or Managed   Start Date: 04/09/2021  Expected End Date: 07/10/2021  Recent Progress: On track  Priority: High  Note:   Current Barriers:  Knowledge deficits related to basic COPD self care/management-Kim Kim [name] desires to manage her health at home. She has been diagnosed with COPD and has a consult with Pulmonology on 04/30/21. Kim Kim [name] a smoker and smokes 3-4PPD. She does not desire to quit or cut back. Patient wears glasses and contacts, but has not had an eye exam  since 2018. Kim Kim [name] declined referral for Ophthalmology appointment, due to next available appointment being 6 months out. She scheduled with a provider in [place] for 05/16/21. Kim Kim [name] Kim [name] aware of Pulmonology appointment 8/16 and plans to attend. She has her own transportation. She Kim [name] concerned re: insurance declining sumatriptan coverage. Per patient, this medication Kim [name] the only medication that works for her migraines.-Update-Kim Kim [name] attended Pulmonology appointment, she did not agree with plan of care provided and would like a second opinion. She did complete the prescribed medication, with no relief to her coughing. She reports her coughing Kim [name]  getting worse. Her next PCP visit Kim [name] scheduled for 10/2021.  Limited Social Support Does not adhere to provider recommendations re: smoking cessation Does not maintain contact with provider office Does not contact provider office for questions/concerns  Case Manager Clinical Goal(s): patient will verbalize basic understanding of COPD disease process and self care activities  Patient will attend pulmonology consult on 04/30/21-Met Interventions:  Inter-disciplinary care team collaboration (see longitudinal plan of care) Provided patient with basic written and verbal COPD education on self care/management/and exacerbation prevention  Provided patient with COPD action plan and reinforced importance of daily self assessment Advised patient to call PCP for in person visit, per note 02/2021 she needs to have spirometry. Discuss needing a second opinion for Pulmonology Discussed the importance of smoking cessation and encouraged Kim Kim [name] to start cutting back on the number of cigarettes a day  Provided patient with the number to Office of Patient Experience [phone] Provided therapeutic listening Reviewed upcoming appointments  Patient Goals/Self-Care Activities:  - schedule in person PCP appointment - request a second opinion for Pulmonology - work with MM Pharmacist, for medication management - follow up with requested referral for ophthalmology - begin a symptom diary - keep follow-up appointments  Follow Up Plan: Telephone follow up appointment with care management team member scheduled for:06/14/21 @ 10:30am     Patient Care Plan: Medication Management     Problem Identified: Health Promotion or Disease Self-Management (General Plan of Care)      Goal: Medication Management   Note:   Current Barriers:  Does not adhere to prescribed medication regimen Does not maintain contact with provider office Does not contact provider office for questions/concerns   Pharmacist Clinical  Goal(s):  Over the next 180 days, patient will contact provider office for questions/concerns as evidenced notation of same in electronic health record through collaboration with PharmD and provider.    Interventions: Inter-disciplinary care team collaboration (see longitudinal plan of care) Comprehensive medication review performed; medication list updated in electronic medical record    Patient Goals/Self-Care Activities Over the next 180 days, patient will:  - take medications as prescribed collaborate with provider on medication access solutions  Follow Up Plan: The patient has been provided with contact information for the care management team and has been advised to call with any health related questions or concerns.

## 2021-05-30 NOTE — Patient Outreach (Signed)
Medicaid Managed Care   Nurse Care Manager Note  05/30/2021 Name:  Kim [name] MRN:  [id number] DOB:  24-May-1966  Kim [name] is an 55 y.o. year old female who is a primary patient of Kim Billings, NP.  The [facility] Managed Care Coordination team was consulted for assistance with:    Pulmonary Disease  Kim [name] was given information about Medicaid Managed Care Coordination team services today. Kim [name] Patient agreed to services and verbal consent obtained.  Engaged with patient by telephone for follow up visit in response to provider referral for case management and/or care coordination services.   Assessments/Interventions:  Review of past medical history, allergies, medications, health status, including review of consultants reports, laboratory and other test data, was performed as part of comprehensive evaluation and provision of chronic care management services.  SDOH (Social Determinants of Health) assessments and interventions performed:   Care Plan  Allergies  Allergen Reactions   Penicillins Other (See Comments)    Unknown childhood reaction   Sulfa Antibiotics Swelling    Medications Reviewed Today     Reviewed by Kim Montane, RN (Registered Nurse) on 05/30/21 at 1023  Med List Status: <None>   Medication Order Taking? Sig Documenting Provider Last Dose Status Informant  acetaminophen-codeine (TYLENOL #3) 300-30 MG tablet [id number] No Take 1 tablet by mouth every 4 (four) hours as needed (cough).  Patient not taking: Reported on 05/30/2021   [name], MD Not Taking Active   albuterol (VENTOLIN HFA) 108 (90 Base) MCG/ACT inhaler [id number] Yes Inhale 2 puffs into the lungs every 6 (six) hours as needed for wheezing or shortness of breath. Kim Billings, NP Taking Active   Aspirin-Salicylamide-Caffeine [facility] HEADACHE POWDER PO) [id number] Yes Take by mouth. [provider] Taking Active            Med Note (Lakena Sparlin,  [name] A   Thu May 30, 2021 10:23 AM) Taking 50 a week  azithromycin (ZITHROMAX) 250 MG tablet [id number]  2 on day one then 1 daily [name], MD  Active            Med Note Kim [name], Kim Alred A   Thu May 30, 2021 10:12 AM) completed  famotidine (PEPCID) 20 MG tablet [id number]  One after supper [name], MD  Active   fluticasone [facility]) 50 MCG/ACT nasal spray [id number] Yes shake liquid AND INSTILL 1 SPRAY IN EACH NOSTRIL DAILY Kim Billings, NP Taking Active   ondansetron (ZOFRAN-ODT) 8 MG disintegrating tablet [id number] Yes Take 1 tablet (8 mg total) by mouth every 8 (eight) hours as needed for nausea or vomiting. Kim Billings, NP Taking Active   pantoprazole (PROTONIX) 40 MG tablet [id number]  TAKE ONE TABLET BY MOUTH EVERY MORNING. Take 30-73mns BEFORE first meal of THE DAY WTanda Rockers MD  Active   predniSONE (DELTASONE) 10 MG tablet [id number] Take  4 each am x 2 days,   2 each am x 2 days,  1 each am x 2 days and stop WTanda Rockers MD  Active            Med Note (Kim [name] Kim [name] A   Thu May 30, 2021 10:15 AM) completed  rOPINIRole (REQUIP) 4 MG tablet [id number] TAKE ONE TABLET BY MOUTH FIVE TIMES DAILY HJon Billings NP Taking Active   STIOLTO RESPIMAT 2.5-2.5 MCG/ACT AERS [id number] INHALE 2 PUFFS INTO THE LUNGS DAILY HJon Billings NP Taking Active   SUMAtriptan 6 MG/0.5ML SOAJ  [id number] Yes INJECT 0.5 ML INTO THE SKIN AT ONSET OF HEADACHE. MAY REPEAT AFTER 1 HOUR IF NO BETTER. MAXIMUM OF 2 DOSES IN 24 HOURS [place], Kim Glad, NP Taking Active   Tiotropium Bromide-Olodaterol (STIOLTO RESPIMAT) 2.5-2.5 MCG/ACT AERS [id number]  Inhale 2 puffs into the lungs daily. [name], MD  Active             Patient Active Problem List   Diagnosis Date Noted   Upper airway cough syndrome 05/01/2021   Cigarette smoker 05/01/2021   Multiple pulmonary nodules determined by computed tomography of lung 05/01/2021   Rectal bleeding 02/19/2021   History  of ileus 02/19/2021   Aortic atherosclerosis ([place]) 01/14/2021   Headache disorder 09/20/2020   Rebound headache 09/20/2020   Lumbar radiculopathy 08/01/2020   Cervical radiculopathy 08/01/2020   Compression fracture of T12 vertebra ([place]) 08/01/2020   PTSD (post-traumatic stress disorder) 04/05/2020   GERD (gastroesophageal reflux disease)    Centrilobular emphysema (Kim [name]) 02/21/2016   RLS (restless legs syndrome) 07/02/2015   Migraines 07/02/2015   COPD GOLD ? / active smoker 06/25/2011   Nicotine dependence, cigarettes, w unsp disorders 06/25/2011    Conditions to be addressed/monitored per PCP order:  Pulmonary Disease  Care Plan : COPD (Adult)  Updates made by Kim Montane, RN since 05/30/2021 12:00 AM     Problem: Disease Progression (COPD)      Goal: Disease Progression Minimized or Managed   Start Date: 04/09/2021  Expected End Date: 07/10/2021  Recent Progress: On track  Priority: High  Note:   Current Barriers:  Knowledge deficits related to basic COPD self care/management-Kim [name] desires to manage her health at home. She has been diagnosed with COPD and has a consult with Pulmonology on 04/30/21. Kim [name] is a smoker and smokes 3-4PPD. She does not desire to quit or cut back. Patient wears glasses and contacts, but has not had an eye exam since 2018. Ms. Adeyemi declined referral for Ophthalmology appointment, due to next available appointment being 6 months out. She scheduled with a provider in [place] for 05/16/21. Kim [name] is aware of Pulmonology appointment 8/16 and plans to attend. She has her own transportation. She is concerned re: insurance declining sumatriptan coverage. Per patient, this medication is the only medication that works for her migraines.-Update-Kim [name] attended Pulmonology appointment, she did not agree with plan of care provided and would like a second opinion. She did complete the prescribed medication, with no relief to her coughing. She  reports her coughing is getting worse. Her next PCP visit is scheduled for 10/2021.  Limited Social Support Does not adhere to provider recommendations re: smoking cessation Does not maintain contact with provider office Does not contact provider office for questions/concerns  Case Manager Clinical Goal(s): patient will verbalize basic understanding of COPD disease process and self care activities  Patient will attend pulmonology consult on 04/30/21-Met Interventions:  Inter-disciplinary care team collaboration (see longitudinal plan of care) Provided patient with basic written and verbal COPD education on self care/management/and exacerbation prevention  Provided patient with COPD action plan and reinforced importance of daily self assessment Advised patient to call PCP for in person visit, per note 02/2021 she needs to have spirometry. Discuss needing a second opinion for Pulmonology Discussed the importance of smoking cessation and encouraged Ms. Bierlein to start cutting back on the number of cigarettes a day  Provided patient with the number to Office of Patient Experience [phone] Provided therapeutic listening Reviewed upcoming appointments  Patient  Goals/Self-Care Activities:  - schedule in person PCP appointment - request a second opinion for Pulmonology - work with MM Pharmacist, for medication management - follow up with requested referral for ophthalmology - begin a symptom diary - keep follow-up appointments  Follow Up Plan: Telephone follow up appointment with care management team member scheduled for:06/14/21 @ 10:30am      Follow Up:  Patient agrees to Care Plan and Follow-up.  Plan: The Managed Medicaid care management team will reach out to the patient again over the next 15 days.  Date/time of next scheduled RN care management/care coordination outreach:  06/14/21 @ 10:30am  Lurena Joiner RN, [place] RN Care Coordinator

## 2021-06-05 NOTE — Progress Notes (Signed)
BP 114/73   Pulse 83   Temp 98.1 F (36.7 C) (Oral)   Ht 5' 6.5" (1.689 m)   Wt 200 lb 9.6 oz (91 kg)   SpO2 96%   BMI 31.89 kg/m    Subjective:    Patient ID: Kim [name], female    DOB: 10/31/1965, 56 y.o.   MRN: [id number]  HPI: [place] Dimmitt Fagin is a 55 y.o. female  Chief Complaint  Patient presents with   Follow-up    Patient is here for a follow up. Patient would like to discuss a new referral to a different Lung Specialist.    COPD COPD status: uncontrolled Satisfied with current treatment?: no Oxygen use: no Dyspnea frequency:  Cough frequency: everyday sometimes constant if she doesn't have a drink to calm it down. Rescue inhaler frequency:   Limitation of activity: yes Productive cough:  Last Spirometry:  Pneumovax: Not up to date Influenza: Not up to Date Does have wheezing some days  MIGRAINES Patient states the Immitrex injections have been denied by her in insurance company due to the neurologist putting she has rebound headaches.  Patient only has two injections left which is worrisome since she relies on it when she has a migraine.   Relevant past medical, surgical, family and social history reviewed and updated as indicated. Interim medical history since our last visit reviewed. Allergies and medications reviewed and updated.  Review of Systems  Respiratory:  Positive for cough, shortness of breath and wheezing.   Neurological:  Positive for headaches.   Per HPI unless specifically indicated above     Objective:    BP 114/73   Pulse 83   Temp 98.1 F (36.7 C) (Oral)   Ht 5' 6.5" (1.689 m)   Wt 200 lb 9.6 oz (91 kg)   SpO2 96%   BMI 31.89 kg/m   Wt Readings from Last 3 Encounters:  06/06/21 200 lb 9.6 oz (91 kg)  04/30/21 198 lb 9.6 oz (90.1 kg)  02/19/21 191 lb 2 oz (86.7 kg)    Physical Exam Vitals and nursing note reviewed.  Constitutional:      General: She is not in acute distress.    Appearance: Normal  appearance. She is normal weight. She is not ill-appearing, toxic-appearing or diaphoretic.  HENT:     Head: Normocephalic.     Right Ear: External ear normal.     Left Ear: External ear normal.     Nose: Nose normal.     Mouth/Throat:     Mouth: Mucous membranes are moist.     Pharynx: Oropharynx is clear.  Eyes:     General:        Right eye: No discharge.        Left eye: No discharge.     Extraocular Movements: Extraocular movements intact.     Conjunctiva/sclera: Conjunctivae normal.     Pupils: Pupils are equal, round, and reactive to light.  Cardiovascular:     Rate and Rhythm: Normal rate and regular rhythm.     Heart sounds: No murmur heard. Pulmonary:     Effort: Pulmonary effort is normal. No respiratory distress.     Breath sounds: Normal breath sounds. No wheezing or rales.  Musculoskeletal:     Cervical back: Normal range of motion and neck supple.  Skin:    General: Skin is warm and dry.     Capillary Refill: Capillary refill takes less than 2 seconds.  Neurological:  General: No focal deficit present.     Mental Status: She is alert and oriented to person, place, and time. Mental status is at baseline.  Psychiatric:        Mood and Affect: Mood normal.        Behavior: Behavior normal.        Thought Content: Thought content normal.        Judgment: Judgment normal.    Results for orders placed or performed in visit on 02/19/21  Comprehensive metabolic panel  Result Value Ref Range   Glucose 80 65 - 99 mg/dL   BUN 14 6 - 24 mg/dL   Creatinine, Ser 0.87 0.57 - 1.00 mg/dL   eGFR 79 >59 mL/min/1.73   BUN/Creatinine Ratio 16 9 - 23   Sodium 145 (H) 134 - 144 mmol/L   Potassium 5.2 3.5 - 5.2 mmol/L   Chloride 104 96 - 106 mmol/L   CO2 23 20 - 29 mmol/L   Calcium 9.8 8.7 - 10.2 mg/dL   Total Protein 7.2 6.0 - 8.5 g/dL   Albumin 4.3 3.8 - 4.9 g/dL   Globulin, Total 2.9 1.5 - 4.5 g/dL   Albumin/Globulin Ratio 1.5 1.2 - 2.2   Bilirubin Total 0.3 0.0 -  1.2 mg/dL   Alkaline Phosphatase 119 44 - 121 IU/L   AST 22 0 - 40 IU/L   ALT 21 0 - 32 IU/L  TSH  Result Value Ref Range   TSH 1.130 0.450 - 4.500 uIU/mL  Iron, TIBC and Ferritin Panel  Result Value Ref Range   Total Iron Binding Capacity 352 250 - 450 ug/dL   UIBC 273 131 - 425 ug/dL   Iron 79 27 - 159 ug/dL   Iron Saturation 22 15 - 55 %   Ferritin 59 15 - 150 ng/mL  CBC With Differential/Platelet  Result Value Ref Range   WBC 7.6 3.4 - 10.8 x10E3/uL   RBC 4.43 3.77 - 5.28 x10E6/uL   Hemoglobin 14.8 11.1 - 15.9 g/dL   Hematocrit 42.6 34.0 - 46.6 %   MCV 96 79 - 97 fL   MCH 33.4 (H) 26.6 - 33.0 pg   MCHC 34.7 31.5 - 35.7 g/dL   RDW 15.5 (H) 11.7 - 15.4 %   Platelets 249 150 - 450 x10E3/uL   Neutrophils 73 Not Estab. %   Lymphs 20 Not Estab. %   MID 7 Not Estab. %   Neutrophils Absolute 5.6 1.4 - 7.0 x10E3/uL   Lymphocytes Absolute 1.5 0.7 - 3.1 x10E3/uL   MID (Absolute) 0.5 0.1 - 1.6 X10E3/uL      Assessment & Plan:   Problem List Items Addressed This Visit       Cardiovascular and Mediastinum   Migraines    Chronic. Ongoing. States she does have headaches daily but only uses the Immitrex when she has a migraine.  It has helped her migraines for years. Will resend Immitrex to initiate the prior auth to see if medication can be approved.  Will also reach out to see if Neurology NP has returned in order to get new appointment with her in case the prior Josem Kaufmann is denied. Will need another plan if Immitrex is denied. Discussed with patient that is may not be on insurance formulary and we may need to find another option.      Relevant Medications   SUMAtriptan 6 MG/0.5ML SOAJ     Respiratory   Centrilobular emphysema ([place])    Ongoing. Not well controlled.  Saw Pulmonology whom she felt didn't help her cough.  Requesting new referral to see a different Pulmonologist.  Referral was placed today during the visit.       Relevant Medications   BREZTRI AEROSPHERE 160-9-4.8  MCG/ACT AERO   Other Visit Diagnoses     Cough    -  Primary   Relevant Orders   Ambulatory referral to Pulmonology        Follow up plan: Return in about 3 months (around 09/05/2021) for Lung Check .  A total of 30 minutes were spent on this encounter today.  When total time is documented, this includes both the face-to-face and non-face-to-face time personally spent before, during and after the visit on the date of the encounter discussing plan of care moving forward for Migraines in case Immitrex is not covered.

## 2021-06-06 ENCOUNTER — Ambulatory Visit (INDEPENDENT_AMBULATORY_CARE_PROVIDER_SITE_OTHER): Payer: Medicaid Other | Admitting: Nurse Practitioner

## 2021-06-06 ENCOUNTER — Encounter: Payer: Self-pay | Admitting: Nurse Practitioner

## 2021-06-06 ENCOUNTER — Other Ambulatory Visit: Payer: Self-pay

## 2021-06-06 VITALS — BP 114/73 | HR 83 | Temp 98.1°F | Ht 66.5 in | Wt 200.6 lb

## 2021-06-06 DIAGNOSIS — J432 Centrilobular emphysema: Secondary | ICD-10-CM | POA: Diagnosis not present

## 2021-06-06 DIAGNOSIS — G43009 Migraine without aura, not intractable, without status migrainosus: Secondary | ICD-10-CM

## 2021-06-06 DIAGNOSIS — R059 Cough, unspecified: Secondary | ICD-10-CM | POA: Diagnosis not present

## 2021-06-06 MED ORDER — SUMATRIPTAN SUCCINATE 6 MG/0.5ML ~~LOC~~ SOAJ: SUBCUTANEOUS | 1 refills | Status: DC

## 2021-06-06 NOTE — Assessment & Plan Note (Signed)
Chronic. Ongoing. States she does have headaches daily but only uses the Immitrex when she has a migraine.  It has helped her migraines for years. Will resend Immitrex to initiate the prior auth to see if medication can be approved.  Will also reach out to see if Neurology NP has returned in order to get new appointment with her in case the prior Berkley Harvey is denied. Will need another plan if Immitrex is denied. Discussed with patient that is may not be on insurance formulary and we may need to find another option.

## 2021-06-06 NOTE — Assessment & Plan Note (Signed)
Ongoing. Not well controlled.  Saw Pulmonology whom she felt didn't help her cough.  Requesting new referral to see a different Pulmonologist.  Referral was placed today during the visit.

## 2021-06-07 ENCOUNTER — Other Ambulatory Visit: Payer: Self-pay | Admitting: Nurse Practitioner

## 2021-06-14 ENCOUNTER — Other Ambulatory Visit: Payer: Self-pay | Admitting: *Deleted

## 2021-06-14 ENCOUNTER — Other Ambulatory Visit: Payer: Self-pay

## 2021-06-14 ENCOUNTER — Telehealth: Payer: Self-pay

## 2021-06-14 NOTE — Patient Outreach (Signed)
Medicaid Managed Care   Nurse Care Manager Note  06/14/2021 Name:  Kim [name] MRN:  [id number] DOB:  1966/08/11  Kim [name] is an 55 y.o. year old female who is a primary patient of Kim Billings, NP.  [facility] Managed Care Coordination team was consulted for assistance with:    COPD Chronic pain  [name] was given information about Medicaid Managed Care Coordination team services today. Kim [name] Patient agreed to services and verbal consent obtained.  Engaged with patient by telephone for follow up visit in response to provider referral for case management and/or care coordination services.   Assessments/Interventions:  Review of past medical history, allergies, medications, health status, including review of consultants reports, laboratory and other test data, was performed as part of comprehensive evaluation and provision of chronic care management services.  SDOH (Social Determinants of Health) assessments and interventions performed: SDOH Interventions    Flowsheet Row Most Recent Value  SDOH Interventions   Social Connections Interventions Patient Refused       Care Plan  Allergies  Allergen Reactions   Penicillins Other (See Comments)    Unknown childhood reaction   Sulfa Antibiotics Swelling    Medications Reviewed Today     Reviewed by Kim Montane, RN (Registered Nurse) on 06/14/21 at 71  Med List Status: <None>   Medication Order Taking? Sig Documenting Provider Last Dose Status Informant  Aspirin-Salicylamide-Caffeine (BC HEADACHE POWDER PO) [id number] Yes Take by mouth. [provider] Taking Active            Med Note (Haylei Cobin A   Thu May 30, 2021 10:23 AM) Taking 50 a week  BREZTRI AEROSPHERE 160-9-4.8 MCG/ACT AERO [id number] No Inhale 2 puffs into the lungs 2 (two) times daily.  Patient not taking: Reported on 06/14/2021   [provider] Not Taking Active   fluticasone (FLONASE) 50 MCG/ACT  nasal spray [id number] Yes shake liquid AND INSTILL 1 SPRAY IN EACH NOSTRIL DAILY Kim Billings, NP Taking Active   ondansetron (ZOFRAN-ODT) 8 MG disintegrating tablet [id number] Yes Take 1 tablet (8 mg total) by mouth every 8 (eight) hours as needed for nausea or vomiting. Kim Billings, NP Taking Active   PROAIR HFA 108 [phone] Base) MCG/ACT inhaler [id number] Yes INHALE 2 PUFFS INTO THE LUNGS EVERY 6 HOURS AS NEEDED FOR WHEEZING OR SHORTNESS OF Kim Raw, NP Taking Active   rOPINIRole (REQUIP) 4 MG tablet [id number] Yes TAKE ONE TABLET BY MOUTH FIVE TIMES DAILY Kim Billings, NP Taking Active   STIOLTO RESPIMAT 2.5-2.5 MCG/ACT AERS [id number] Yes INHALE 2 PUFFS INTO THE LUNGS DAILY Kim Billings, NP Taking Active   SUMAtriptan 6 MG/0.5ML Kim [name] [id number] Yes INJECT 0.5 ML INTO THE SKIN AT ONSET OF HEADACHE. MAY REPEAT AFTER 1 HOUR IF NO BETTER. MAXIMUM OF 2 DOSES IN 5 HOURS Kim Billings, NP Taking Active             Patient Active Problem List   Diagnosis Date Noted   Upper airway cough syndrome 05/01/2021   Cigarette smoker 05/01/2021   Multiple pulmonary nodules determined by computed tomography of lung 05/01/2021   Rectal bleeding 02/19/2021   History of ileus 02/19/2021   Aortic atherosclerosis ([place]) 01/14/2021   Headache disorder 09/20/2020   Rebound headache 09/20/2020   Lumbar radiculopathy 08/01/2020   Cervical radiculopathy 08/01/2020   Compression fracture of T12 vertebra ([place]) 08/01/2020   PTSD (post-traumatic stress disorder) 04/05/2020   GERD (gastroesophageal reflux disease)  Centrilobular emphysema ([place]) 02/21/2016   RLS (restless legs syndrome) 07/02/2015   Migraines 07/02/2015   COPD GOLD ? / active smoker 06/25/2011   Nicotine dependence, cigarettes, w unsp disorders 06/25/2011    Conditions to be addressed/monitored per PCP order:  COPD and chronic pain  Care Plan : COPD (Adult)  Updates made by Kim Montane, RN since  06/14/2021 12:00 AM     Problem: Disease Progression (COPD)      Long-Range Goal: Disease Progression Minimized or Managed   Start Date: 04/09/2021  Expected End Date: 07/15/2021  Recent Progress: On track  Priority: High  Note:   Current Barriers:  Knowledge deficits related to basic COPD self care/management-Ms. Doyel desires to manage her health at home. She has been diagnosed with COPD and has a consult with Pulmonology on 04/30/21. Ms. Dorman is a smoker and smokes 3-4PPD. She does not desire to quit or cut back. Patient wears glasses and contacts, but has not had an eye exam since 2018. Ms. Hilton declined referral for Ophthalmology appointment, due to next available appointment being 6 months out. She scheduled with a provider in [place] for 05/16/21. Ms. Hansell is aware of Pulmonology appointment 8/16 and plans to attend. She has her own transportation. She is concerned re: insurance declining sumatriptan coverage. Per patient, this medication is the only medication that works for her migraines. Ms. Atha attended Pulmonology appointment, she did not agree with plan of care provided and would like a second opinion. She did complete the prescribed medication, with no relief to her coughing. She reports her coughing is getting worse. Her next PCP visit is scheduled for 08/2021. -Update-Ms. Bartee attended in person PCP appointment, referral placed for second opinion with Pulmonology. Ms. Canizales reports daily headaches, back and throat pain 24/7. She drinks, applies heat, and takes BC powder to manage the pain. She is glad that Sumatriptan was finally approved for severe headaches. She meets with a counselor with [facility] [employer] and has questions regarding possible medications to help her sleep. Limited Social Support Does not adhere to provider recommendations re: smoking cessation Does not maintain contact with provider office Does not contact provider office for questions/concerns  Case  Manager Clinical Goal(s): patient will verbalize basic understanding of COPD disease process and self care activities  Patient will attend pulmonology consult on 04/30/21-Met Interventions:  Inter-disciplinary care team collaboration (see longitudinal plan of care) Provided patient with basic written and verbal COPD education on self care/management/and exacerbation prevention  Provided patient with COPD action plan and reinforced importance of daily self assessment Advised patient to call [facility] Pulmonology [phone] to follow up on referral/schedule appointment, inquire whether she will have spirometry with Pulmonologist or if she should have it with PCP Discussed the importance of smoking cessation and encouraged Ms. Nebergall to start cutting back on the number of cigarettes a day  Collaborate with PCP for pain management Provided therapeutic listening Reviewed upcoming appointments  Patient Goals/Self-Care Activities:  - call [facility] Pulmonology [phone] to follow up on referral/schedule appointment - attend scheduled appointments - request a second opinion for Pulmonology - work with MM Pharmacist, for medication management - follow up with requested referral for ophthalmology - begin a symptom diary - keep follow-up appointments  Follow Up Plan: Telephone follow up appointment with care management team member scheduled for:07/15/21 @ 10:30am      Follow Up:  Patient agrees to Care Plan and Follow-up.  Plan: The Managed Medicaid care management team will reach out  to the patient again over the next 30 days.  Date/time of next scheduled RN care management/care coordination outreach:  07/15/21 @ 10:30am  Lurena Joiner RN, [place] RN Care Coordinator

## 2021-06-14 NOTE — Telephone Encounter (Signed)
Copied from CRM [id number]. Topic: Referral - Status >> Jun 14, 2021 11:19 AM Kim [name] wrote: Reason for CRM: Pt stated Kim [name] is with [facility] and they no longer accept [facility]. Pt stated she needs a referral to a location that accepts [facility].  Routing to referral coordinator to check into this.

## 2021-06-14 NOTE — Patient Instructions (Signed)
Visit Information  Ms. Lave was given information about Medicaid Managed Care team care coordination services as a part of their [place] Medicaid benefit. Gershon Crane Etchison verbally consented to engagement with the [facility] Managed Care team.   If you are experiencing a medical emergency, please call 911 or report to your local emergency department or urgent care.   If you have a non-emergency medical problem during routine business hours, please contact your provider's office and ask to speak with a nurse.   For questions related to your [facility], please call: [phone] or visit the homepage here: [email or web address]  If you would like to schedule transportation through your [facility], please call the following number at least 2 days in advance of your appointment: [phone].   Call the [place] at [phone], at any time, 24 hours a day, 7 days a week. If you are in danger or need immediate medical attention call 911.  If you would like help to quit smoking, call 1-800-QUIT-NOW 531-083-9396) OR Espaol: 1-855-Djelo-Ya (9-509-326-7124) o para ms informacin haga clic aqu or Text READY to 200-400 to register via text  Kim [name] - following are the goals we discussed in your visit today:   Goals Addressed             This Visit's Progress    Track and Manage My Symptoms-COPD       Timeframe:  Long-Range Goal Priority:  High Start Date:  04/09/21                           Expected End Date: 07/15/21                      Follow Up Date 07/15/21    - call [facility] Pulmonology [phone] to follow up on referral/schedule appointment - attend scheduled appointments - request a second opinion for Pulmonology - work on cutting back on smoking  - follow up with requested referral for ophthalmology - begin a  symptom diary - keep follow-up appointments    Why is this important?   Tracking your symptoms and other information about your health helps your doctor plan your care.  Write down the symptoms, the time of day, what you were doing and what medicine you are taking.  You will soon learn how to manage your symptoms.             Please see education materials related to COPD and back pain provided by MyChart link.  Patient has access to MyChart and can view provided education  Telephone follow up appointment with Managed Medicaid care management team member scheduled for:07/15/21 @ 10:30am  Lurena Joiner RN, BSN [place] RN Care Coordinator   Following is a copy of your plan of care:  Patient Care Plan: COPD (Adult)     Problem Identified: Disease Progression (COPD)      Long-Range Goal: Disease Progression Minimized or Managed   Start Date: 04/09/2021  Expected End Date: 07/15/2021  Recent Progress: On track  Priority: High  Note:   Current Barriers:  Knowledge deficits related to basic COPD self care/management-Ms. Rivere desires to manage her health at home. She has been diagnosed with COPD and has a consult with Pulmonology on 04/30/21. Ms. Cornman is a smoker and smokes 3-4PPD. She does not desire to quit or cut back. Patient  wears glasses and contacts, but has not had an eye exam since 2018. Ms. Loredo declined referral for Ophthalmology appointment, due to next available appointment being 6 months out. She scheduled with a provider in [place] for 05/16/21. Ms. Laden is aware of Pulmonology appointment 8/16 and plans to attend. She has her own transportation. She is concerned re: insurance declining sumatriptan coverage. Per patient, this medication is the only medication that works for her migraines. Ms. Teng attended Pulmonology appointment, she did not agree with plan of care provided and would like a second opinion. She did complete the  prescribed medication, with no relief to her coughing. She reports her coughing is getting worse. Her next PCP visit is scheduled for 08/2021. -Update-Ms. Eager attended in person PCP appointment, referral placed for second opinion with Pulmonology. Ms. Vetrano reports daily headaches, back and throat pain 24/7. She drinks, applies heat, and takes BC powder to manage the pain. She is glad that Sumatriptan was finally approved for severe headaches. She meets with a counselor with [employer] and has questions regarding possible medications to help her sleep. Limited Social Support Does not adhere to provider recommendations re: smoking cessation Does not maintain contact with provider office Does not contact provider office for questions/concerns  Case Manager Clinical Goal(s): patient will verbalize basic understanding of COPD disease process and self care activities  Patient will attend pulmonology consult on 04/30/21-Met Interventions:  Inter-disciplinary care team collaboration (see longitudinal plan of care) Provided patient with basic written and verbal COPD education on self care/management/and exacerbation prevention  Provided patient with COPD action plan and reinforced importance of daily self assessment Advised patient to call [facility] Pulmonology [phone] to follow up on referral/schedule appointment, inquire whether she will have spirometry with Pulmonologist or if she should have it with PCP Discussed the importance of smoking cessation and encouraged Ms. Sprecher to start cutting back on the number of cigarettes a day  Collaborate with PCP for pain management Provided therapeutic listening Reviewed upcoming appointments  Patient Goals/Self-Care Activities:  - call [facility] Pulmonology [phone] to follow up on referral/schedule appointment - attend scheduled appointments - request a second opinion for Pulmonology - work with MM Pharmacist, for medication  management - follow up with requested referral for ophthalmology - begin a symptom diary - keep follow-up appointments  Follow Up Plan: Telephone follow up appointment with care management team member scheduled for:07/15/21 @ 10:30am     Patient Care Plan: Medication Management     Problem Identified: Health Promotion or Disease Self-Management (General Plan of Care)      Goal: Medication Management   Note:   Current Barriers:  Does not adhere to prescribed medication regimen Does not maintain contact with provider office Does not contact provider office for questions/concerns   Pharmacist Clinical Goal(s):  Over the next 180 days, patient will contact provider office for questions/concerns as evidenced notation of same in electronic health record through collaboration with PharmD and provider.    Interventions: Inter-disciplinary care team collaboration (see longitudinal plan of care) Comprehensive medication review performed; medication list updated in electronic medical record    Patient Goals/Self-Care Activities Over the next 180 days, patient will:  - take medications as prescribed collaborate with provider on medication access solutions  Follow Up Plan: The patient has been provided with contact information for the care management team and has been advised to call with any health related questions or concerns.

## 2021-06-15 DIAGNOSIS — S22000A Wedge compression fracture of unspecified thoracic vertebra, initial encounter for closed fracture: Secondary | ICD-10-CM | POA: Diagnosis not present

## 2021-06-15 DIAGNOSIS — R32 Unspecified urinary incontinence: Secondary | ICD-10-CM | POA: Diagnosis not present

## 2021-06-21 ENCOUNTER — Other Ambulatory Visit: Payer: Self-pay | Admitting: Internal Medicine

## 2021-06-21 DIAGNOSIS — R918 Other nonspecific abnormal finding of lung field: Secondary | ICD-10-CM

## 2021-07-03 NOTE — Progress Notes (Signed)
BP 114/74   Pulse 90   Temp 98.3 F (36.8 C) (Oral)   Wt 200 lb (90.7 kg)   SpO2 97%   BMI 31.80 kg/m    Subjective:    Patient ID: Kim [name], female    DOB: 02/13/1966, 55 y.o.   MRN: [id number]  HPI: Kim [name] is a 55 y.o. female  Chief Complaint  Patient presents with   Pain    Patient is here to follow up on Pain. Patient states her back pain is worse and she is having issues with walking. Patient states she has used warm compression to help, but nothing seems to help it.    Patient states about a week ago she fell and hit the threshold of the building and since then has been having pain.  Patient states she is having a hard time bending over, walking and can't drive.  She has been using a heating pad.  She uses BC's but they haven't been working.     Relevant past medical, surgical, family and social history reviewed and updated as indicated. Interim medical history since our last visit reviewed. Allergies and medications reviewed and updated.  Review of Systems  Musculoskeletal:  Positive for back pain.   Per HPI unless specifically indicated above     Objective:    BP 114/74   Pulse 90   Temp 98.3 F (36.8 C) (Oral)   Wt 200 lb (90.7 kg)   SpO2 97%   BMI 31.80 kg/m   Wt Readings from Last 3 Encounters:  07/04/21 200 lb (90.7 kg)  06/06/21 200 lb 9.6 oz (91 kg)  04/30/21 198 lb 9.6 oz (90.1 kg)    Physical Exam Vitals and nursing note reviewed.  Constitutional:      General: She is not in acute distress.    Appearance: Normal appearance. She is normal weight. She is not ill-appearing, toxic-appearing or diaphoretic.  HENT:     Head: Normocephalic.     Right Ear: External ear normal.     Left Ear: External ear normal.     Nose: Nose normal.     Mouth/Throat:     Mouth: Mucous membranes are moist.     Pharynx: Oropharynx is clear.  Eyes:     General:        Right eye: No discharge.        Left eye: No discharge.      Extraocular Movements: Extraocular movements intact.     Conjunctiva/sclera: Conjunctivae normal.     Pupils: Pupils are equal, round, and reactive to light.  Cardiovascular:     Rate and Rhythm: Normal rate and regular rhythm.     Heart sounds: No murmur heard. Pulmonary:     Effort: Pulmonary effort is normal. No respiratory distress.     Breath sounds: Normal breath sounds. No wheezing or rales.  Musculoskeletal:     Cervical back: Normal range of motion and neck supple.     Lumbar back: Spasms and tenderness present. Decreased range of motion.  Skin:    General: Skin is warm and dry.     Capillary Refill: Capillary refill takes less than 2 seconds.  Neurological:     General: No focal deficit present.     Mental Status: She is alert and oriented to person, place, and time. Mental status is at baseline.  Psychiatric:        Mood and Affect: Mood normal.  Behavior: Behavior normal.        Thought Content: Thought content normal.        Judgment: Judgment normal.    Results for orders placed or performed in visit on 02/19/21  Comprehensive metabolic panel  Result Value Ref Range   Glucose 80 65 - 99 mg/dL   BUN 14 6 - 24 mg/dL   Creatinine, Ser 0.87 0.57 - 1.00 mg/dL   eGFR 79 >59 mL/min/1.73   BUN/Creatinine Ratio 16 9 - 23   Sodium 145 (H) 134 - 144 mmol/L   Potassium 5.2 3.5 - 5.2 mmol/L   Chloride 104 96 - 106 mmol/L   CO2 23 20 - 29 mmol/L   Calcium 9.8 8.7 - 10.2 mg/dL   Total Protein 7.2 6.0 - 8.5 g/dL   Albumin 4.3 3.8 - 4.9 g/dL   Globulin, Total 2.9 1.5 - 4.5 g/dL   Albumin/Globulin Ratio 1.5 1.2 - 2.2   Bilirubin Total 0.3 0.0 - 1.2 mg/dL   Alkaline Phosphatase 119 44 - 121 IU/L   AST 22 0 - 40 IU/L   ALT 21 0 - 32 IU/L  TSH  Result Value Ref Range   TSH 1.130 0.450 - 4.500 uIU/mL  Iron, TIBC and Ferritin Panel  Result Value Ref Range   Total Iron Binding Capacity 352 250 - 450 ug/dL   UIBC 273 131 - 425 ug/dL   Iron 79 27 - 159 ug/dL   Iron  Saturation 22 15 - 55 %   Ferritin 59 15 - 150 ng/mL  CBC With Differential/Platelet  Result Value Ref Range   WBC 7.6 3.4 - 10.8 x10E3/uL   RBC 4.43 3.77 - 5.28 x10E6/uL   Hemoglobin 14.8 11.1 - 15.9 g/dL   Hematocrit 42.6 34.0 - 46.6 %   MCV 96 79 - 97 fL   MCH 33.4 (H) 26.6 - 33.0 pg   MCHC 34.7 31.5 - 35.7 g/dL   RDW 15.5 (H) 11.7 - 15.4 %   Platelets 249 150 - 450 x10E3/uL   Neutrophils 73 Not Estab. %   Lymphs 20 Not Estab. %   MID 7 Not Estab. %   Neutrophils Absolute 5.6 1.4 - 7.0 x10E3/uL   Lymphocytes Absolute 1.5 0.7 - 3.1 x10E3/uL   MID (Absolute) 0.5 0.1 - 1.6 X10E3/uL      Assessment & Plan:   Problem List Items Addressed This Visit       Nervous and Auditory   Chronic bilateral low back pain with sciatica - Primary    Chronic. Exacerbated due to fall.  Will give toradol in office today. Will give steroids and flexeril to help with muscle spasm.  Toradol given for patient to take at home.  Do not start oral toradol until tomorrow due to receiving IM toradol in office today. Referral placed for pain management for on going treatment of back pain.         Relevant Medications   cyclobenzaprine (FLEXERIL) 10 MG tablet   predniSONE (DELTASONE) 10 MG tablet   ketorolac (TORADOL) 10 MG tablet   ketorolac (TORADOL) injection 60 mg   Other Relevant Orders   Ambulatory referral to Pain Clinic     Follow up plan: Return if symptoms worsen or fail to improve.

## 2021-07-04 ENCOUNTER — Other Ambulatory Visit: Payer: Self-pay | Admitting: Nurse Practitioner

## 2021-07-04 ENCOUNTER — Encounter: Payer: Self-pay | Admitting: Nurse Practitioner

## 2021-07-04 ENCOUNTER — Other Ambulatory Visit: Payer: Self-pay

## 2021-07-04 ENCOUNTER — Ambulatory Visit (INDEPENDENT_AMBULATORY_CARE_PROVIDER_SITE_OTHER): Payer: Medicaid Other | Admitting: Nurse Practitioner

## 2021-07-04 VITALS — BP 114/74 | HR 90 | Temp 98.3°F | Wt 200.0 lb

## 2021-07-04 DIAGNOSIS — G8929 Other chronic pain: Secondary | ICD-10-CM

## 2021-07-04 DIAGNOSIS — M5440 Lumbago with sciatica, unspecified side: Secondary | ICD-10-CM | POA: Diagnosis not present

## 2021-07-04 HISTORY — DX: Other chronic pain: G89.29

## 2021-07-04 MED ORDER — PREDNISONE 10 MG PO TABS: 10.0000 mg | ORAL_TABLET | Freq: Every day | ORAL | 0 refills | Status: DC

## 2021-07-04 MED ORDER — CYCLOBENZAPRINE HCL 10 MG PO TABS: 10.0000 mg | ORAL_TABLET | Freq: Three times a day (TID) | ORAL | 0 refills | Status: DC | PRN

## 2021-07-04 MED ORDER — KETOROLAC TROMETHAMINE 10 MG PO TABS: 10.0000 mg | ORAL_TABLET | Freq: Four times a day (QID) | ORAL | 0 refills | Status: DC | PRN

## 2021-07-04 MED ORDER — KETOROLAC TROMETHAMINE 60 MG/2ML IM SOLN
60.0000 mg | Freq: Once | INTRAMUSCULAR | Status: AC
  Administered 2021-07-04: 60 mg via INTRAMUSCULAR

## 2021-07-04 NOTE — Assessment & Plan Note (Signed)
Chronic. Exacerbated due to fall.  Will give toradol in office today. Will give steroids and flexeril to help with muscle spasm.  Toradol given for patient to take at home.  Do not start oral toradol until tomorrow due to receiving IM toradol in office today. Referral placed for pain management for on going treatment of back pain.

## 2021-07-04 NOTE — Telephone Encounter (Signed)
Requested medication (s) are due for refill today: Yes  Requested medication (s) are on the active medication list: Yes  Last refill:  04/17/21  Future visit scheduled: Yes  Notes to clinic:  See request.    Requested Prescriptions  Pending Prescriptions Disp Refills   ondansetron (ZOFRAN-ODT) 8 MG disintegrating tablet [Pharmacy Med Name: ondansetron 8 mg disintegrating tablet] 90 tablet 2    Sig: TAKE ONE TABLET BY MOUTH EVERY 8 HOURS AS NEEDED FOR NAUSEA AND VOMITING     Not Delegated - Gastroenterology: Antiemetics Failed - 07/04/2021  8:01 AM      Failed - This refill cannot be delegated      Passed - Valid encounter within last 6 months    Recent Outpatient Visits           4 weeks ago Cough   [facility] Larae Grooms, NP   2 months ago Centrilobular emphysema (HCC)   [facility] Larae Grooms, NP   3 months ago Pulmonary emphysema, unspecified emphysema type (HCC)   [facility] Larae Grooms, NP   4 months ago Centrilobular emphysema (HCC)   [facility] [place] T, NP   5 months ago Chronic obstructive pulmonary disease, unspecified COPD type (HCC)   [facility] Larae Grooms, NP       Future Appointments             Today Larae Grooms, NP [facility]   In 2 months Larae Grooms, NP [facility]   In 3 months Larae Grooms, NP [facility], [facility]

## 2021-07-05 MED ORDER — METHOCARBAMOL 500 MG PO TABS: 500.0000 mg | ORAL_TABLET | Freq: Four times a day (QID) | ORAL | 0 refills | Status: DC

## 2021-07-08 DIAGNOSIS — G8929 Other chronic pain: Secondary | ICD-10-CM

## 2021-07-10 DIAGNOSIS — M545 Low back pain, unspecified: Secondary | ICD-10-CM

## 2021-07-11 ENCOUNTER — Ambulatory Visit
Admission: RE | Admit: 2021-07-11 | Discharge: 2021-07-11 | Disposition: A | Discharge status: Home / Self Care | Payer: Medicaid Other | Source: Ambulatory Visit | Attending: Nurse Practitioner | Admitting: Nurse Practitioner

## 2021-07-11 ENCOUNTER — Ambulatory Visit: Payer: Medicaid Other

## 2021-07-11 ENCOUNTER — Other Ambulatory Visit: Payer: Self-pay

## 2021-07-11 ENCOUNTER — Ambulatory Visit
Admission: RE | Admit: 2021-07-11 | Discharge: 2021-07-11 | Disposition: A | Discharge status: Home / Self Care | Payer: Medicaid Other | Source: Home / Self Care | Attending: Nurse Practitioner | Admitting: Nurse Practitioner

## 2021-07-11 DIAGNOSIS — G8929 Other chronic pain: Secondary | ICD-10-CM | POA: Insufficient documentation

## 2021-07-11 DIAGNOSIS — M545 Low back pain, unspecified: Secondary | ICD-10-CM | POA: Insufficient documentation

## 2021-07-15 ENCOUNTER — Other Ambulatory Visit: Payer: Self-pay

## 2021-07-15 ENCOUNTER — Other Ambulatory Visit: Payer: Self-pay | Admitting: *Deleted

## 2021-07-15 DIAGNOSIS — S22000A Wedge compression fracture of unspecified thoracic vertebra, initial encounter for closed fracture: Secondary | ICD-10-CM | POA: Diagnosis not present

## 2021-07-15 DIAGNOSIS — R32 Unspecified urinary incontinence: Secondary | ICD-10-CM | POA: Diagnosis not present

## 2021-07-15 NOTE — Patient Outreach (Signed)
Medicaid Managed Care   Nurse Care Manager Note  07/15/2021 Name:  Kim [name] MRN:  [id number] DOB:  31-Oct-1965  Kim [name] Weight is an 55 y.o. year old female who is a primary patient of Kim Billings, NP.  [facility] Managed Care Coordination team was consulted for assistance with:    COPD pain  Kim [name] was given information about Medicaid Managed Care Coordination team services today. Kim [name] Furnari Patient agreed to services and verbal consent obtained.  Engaged with patient by telephone for follow up visit in response to provider referral for case management and/or care coordination services.   Assessments/Interventions:  Review of past medical history, allergies, medications, health status, including review of consultants reports, laboratory and other test data, was performed as part of comprehensive evaluation and provision of chronic care management services.  SDOH (Social Determinants of Health) assessments and interventions performed: SDOH Interventions    Flowsheet Row Most Recent Value  SDOH Interventions   Housing Interventions Intervention Not Indicated  Transportation Interventions Intervention Not Indicated       Care Plan  Allergies  Allergen Reactions   Penicillins Other (See Comments)    Unknown childhood reaction   Sulfa Antibiotics Swelling    Medications Reviewed Today     Reviewed by Melissa Montane, RN (Registered Nurse) on 07/15/21 at 24  Med List Status: <None>   Medication Order Taking? Sig Documenting Provider Last Dose Status Informant  Aspirin-Salicylamide-Caffeine (BC HEADACHE POWDER PO) [id number] Yes Take by mouth. [provider] Taking Active            Med Note (Sheriden Archibeque A   Thu May 30, 2021 10:23 AM) Taking 50 a week  cyclobenzaprine (FLEXERIL) 10 MG tablet [id number] No Take 1 tablet (10 mg total) by mouth 3 (three) times daily as needed for muscle spasms.  Patient not taking: Reported on  07/15/2021   Kim Billings, NP Not Taking Active   fluticasone [facility]) 50 MCG/ACT nasal spray [id number] Yes shake liquid AND INSTILL 1 SPRAY IN EACH NOSTRIL DAILY Kim Billings, NP Taking Active   ketorolac (TORADOL) 10 MG tablet [id number] No Take 1 tablet (10 mg total) by mouth every 6 (six) hours as needed.  Patient not taking: Reported on 07/15/2021   Kim Billings, NP Not Taking Active            Med Note [name], [name]   Mon Jul 15, 2021 10:57 AM) Ran out  methocarbamol (ROBAXIN) 500 MG tablet [id number] No Take 1 tablet (500 mg total) by mouth 4 (four) times daily.  Patient not taking: Reported on 07/15/2021   Kim Billings, NP Not Taking Active            Med Note ([name]   Mon Jul 15, 2021 10:55 AM) Ran out  ondansetron (ZOFRAN-ODT) 8 MG disintegrating tablet [id number] Yes TAKE ONE TABLET BY MOUTH EVERY 8 HOURS AS NEEDED FOR NAUSEA AND VOMITING Kim Billings, NP Taking Active   predniSONE (DELTASONE) 10 MG tablet [id number] No Take 1 tablet (10 mg total) by mouth daily with breakfast. Take 6 tabs today, 5 tomorrow and decrease by 1 each day until course is complete.  Patient not taking: Reported on 07/15/2021   Kim Billings, NP Not Taking Active            Med Note [name]   Mon Jul 15, 2021 10:59 AM) completed  PROAIR HFA 108 [phone] Base) MCG/ACT inhaler [id number] No INHALE 2 PUFFS  INTO THE LUNGS EVERY 6 HOURS AS NEEDED FOR WHEEZING OR SHORTNESS OF BREATH  Patient not taking: Reported on 07/15/2021   Kim Billings, NP Not Taking Active   rOPINIRole (REQUIP) 4 MG tablet [id number] Yes TAKE ONE TABLET BY MOUTH FIVE TIMES DAILY Kim Billings, NP Taking Active   STIOLTO RESPIMAT 2.5-2.5 MCG/ACT AERS [id number] No INHALE 2 PUFFS INTO THE LUNGS DAILY  Patient not taking: Reported on 07/15/2021   Kim Billings, NP Not Taking Active   SUMAtriptan 6 MG/0.5ML [name] [id number] Yes INJECT 0.5 ML INTO THE SKIN AT ONSET OF HEADACHE. MAY REPEAT  AFTER 1 HOUR IF NO BETTER. MAXIMUM OF 2 DOSES IN 24 HOURS Kim Billings, NP Taking Active             Patient Active Problem List   Diagnosis Date Noted   Chronic bilateral low back pain with sciatica 07/04/2021   Upper airway cough syndrome 05/01/2021   Cigarette smoker 05/01/2021   Multiple pulmonary nodules determined by computed tomography of lung 05/01/2021   Rectal bleeding 02/19/2021   History of ileus 02/19/2021   Aortic atherosclerosis ([place]) 01/14/2021   Headache disorder 09/20/2020   Rebound headache 09/20/2020   Lumbar radiculopathy 08/01/2020   Cervical radiculopathy 08/01/2020   Compression fracture of T12 vertebra ([place]) 08/01/2020   PTSD (post-traumatic stress disorder) 04/05/2020   GERD (gastroesophageal reflux disease)    Centrilobular emphysema ([place]) 02/21/2016   RLS (restless legs syndrome) 07/02/2015   Migraines 07/02/2015   COPD GOLD ? / active smoker 06/25/2011   Nicotine dependence, cigarettes, w unsp disorders 06/25/2011    Conditions to be addressed/monitored per PCP order:  COPD and pain  Care Plan : COPD (Adult)  Updates made by Melissa Montane, RN since 07/15/2021 12:00 AM     Problem: Disease Progression (COPD)      Long-Range Goal: Disease Progression Minimized or Managed   Start Date: 04/09/2021  Expected End Date: 08/14/2021  Recent Progress: On track  Priority: High  Note:   Current Barriers:  Knowledge deficits related to basic COPD self care/management-Ms. Lemaire desires to manage her health at home. She has been diagnosed with COPD and has a consult with Pulmonology on 04/30/21. Kim [name] is a smoker and smokes 3-4PPD. She does not desire to quit or cut back. Patient wears glasses and contacts, but has not had an eye exam since 2018. Ms. Parrack declined referral for Ophthalmology appointment, due to next available appointment being 6 months out. She scheduled with a provider in [place] for 05/16/21. Ms. Zucker is aware of Pulmonology  appointment 8/16 and plans to attend. She has her own transportation. She is concerned re: insurance declining sumatriptan coverage. Per patient, this medication is the only medication that works for her migraines. Ms. Mckamey attended Pulmonology appointment, she did not agree with plan of care provided and would like a second opinion. She did complete the prescribed medication, with no relief to her coughing. She reports her coughing is getting worse. Her next PCP visit is scheduled for 08/2021. Ms. Choung attended in person PCP appointment, referral placed for second opinion with Pulmonology. Ms. Ivins reports daily headaches, back and throat pain 24/7. She drinks, applies heat, and takes BC powder to manage the pain. She is glad that Sumatriptan was finally approved for severe headaches. She meets with a counselor with [facility] and has questions regarding possible medications to help her sleep.-Update-Ms. Heiny reports hip, knee and low back pain that is unrelieved with toradol and robaxin.  She had an x-ray done last week and has questions regarding the results. A chest CT is scheduled for 11/2 and she is planning to attend. Limited Social Support Does not adhere to provider recommendations re: smoking cessation Does not maintain contact with provider office Does not contact provider office for questions/concerns  Case Manager Clinical Goal(s): patient will verbalize basic understanding of COPD disease process and self care activities  Patient will attend pulmonology consult on 04/30/21-Met Interventions:  Inter-disciplinary care team collaboration (see longitudinal plan of care)  Provided patient with education on managing back pain, encouraged patient to use ice to lower back 2-3 times a day Advised patient to contact [place] for follow up  Collaborate with PCP re: patient with questions about results and continued pain Provided therapeutic listening Reviewed upcoming appointments:  07/17/21 for CT and 12/2 with [facility] Patient Goals/Self-Care Activities:  - attend CT on 07/17/21 and Ortho 08/16/21 - Ice lower back 2-3 times a day - follow up with provider with questions or concerns - call [place] [phone] to follow up on referral/schedule appointment - attend scheduled appointments - request a second opinion for Pulmonology - work with [facility] Pharmacist, for medication management - follow up with requested referral for ophthalmology - begin a symptom diary - keep follow-up appointments  Follow Up Plan: Telephone follow up appointment with care management team member scheduled for:08/14/21 @ 10:30am      Follow Up:  Patient agrees to Care Plan and Follow-up.  Plan: The Managed Medicaid care management team will reach out to the patient again over the next 30 days.  Date/time of next scheduled RN care management/care coordination outreach:  08/14/21 @ 10:30a  Lurena Joiner RN, [place] RN Care Coordinator

## 2021-07-15 NOTE — Patient Instructions (Signed)
Visit Information  Kim [name] was given information about Medicaid Managed Care team care coordination services as a part of their [place] Medicaid benefit. [name] verbally consented to engagement with the [facility] Managed Care team.   If you are experiencing a medical emergency, please call 911 or report to your local emergency department or urgent care.   If you have a non-emergency medical problem during routine business hours, please contact your provider's office and ask to speak with a nurse.   For questions related to your [facility], please call: [phone] or visit the homepage here: [email or web address]  If you would like to schedule transportation through your [facility], please call the following number at least 2 days in advance of your appointment: [phone].   Call the [place] at [phone], at any time, 24 hours a day, 7 days a week. If you are in danger or need immediate medical attention call 911.  If you would like help to quit smoking, call 1-800-QUIT-NOW 812-113-9964) OR Espaol: 1-855-Djelo-Ya (5-809-983-3825) o para ms informacin haga clic aqu or Text READY to 200-400 to register via text  Kim [name] - following are the goals we discussed in your visit today:   Goals Addressed             This Visit's Progress    Track and Manage My Symptoms-COPD       Timeframe:  Long-Range Goal Priority:  High Start Date:  04/09/21                           Expected End Date: 08/14/21                      Follow Up Date 08/14/21    - attend CT on 07/17/21 - Ice lower back 2-3 times a day - follow up with provider with questions or concerns - call [facility] [phone] to follow up on referral/schedule appointment - attend scheduled appointments - request a second opinion for  Pulmonology - work on cutting back on smoking  - follow up with requested referral for ophthalmology - begin a symptom diary - keep follow-up appointments    Why is this important?   Tracking your symptoms and other information about your health helps your doctor plan your care.  Write down the symptoms, the time of day, what you were doing and what medicine you are taking.  You will soon learn how to manage your symptoms.             Please see education materials related to back pain provided by MyChart link.  Patient has access to MyChart and can view provided education  Telephone follow up appointment with Managed Medicaid care management team member scheduled for:08/14/21 @ 10:30am  Lurena Joiner RN, BSN [place] RN Care Coordinator   Following is a copy of your plan of care:  Patient Care Plan: COPD (Adult)     Problem Identified: Disease Progression (COPD)      Long-Range Goal: Disease Progression Minimized or Managed   Start Date: 04/09/2021  Expected End Date: 08/14/2021  Recent Progress: On track  Priority: High  Note:   Current Barriers:  Knowledge deficits related to basic COPD self care/management-Kim [name] desires to manage her health at home. She has been diagnosed with COPD and has a consult with Pulmonology  on 04/30/21. Kim [name] is a smoker and smokes 3-4PPD. She does not desire to quit or cut back. Patient wears glasses and contacts, but has not had an eye exam since 2018. [name] declined referral for Ophthalmology appointment, due to next available appointment being 6 months out. She scheduled with a provider in [place] for 05/16/21. Kim [name] is aware of Pulmonology appointment 8/16 and plans to attend. She has her own transportation. She is concerned re: insurance declining sumatriptan coverage. Per patient, this medication is the only medication that works for her migraines. [name] attended Pulmonology appointment,  she did not agree with plan of care provided and would like a second opinion. She did complete the prescribed medication, with no relief to her coughing. She reports her coughing is getting worse. Her next PCP visit is scheduled for 08/2021. Kim [name] attended in person PCP appointment, referral placed for second opinion with Pulmonology. Kim [name] reports daily headaches, back and throat pain 24/7. She drinks, applies heat, and takes BC powder to manage the pain. She is glad that Sumatriptan was finally approved for severe headaches. She meets with a counselor with [facility] and has questions regarding possible medications to help her sleep.-Update-Kim [name] reports hip, knee and low back pain that is unrelieved with toradol and robaxin. She had an x-ray done last week and has questions regarding the results. A chest CT is scheduled for 11/2 and she is planning to attend. Limited Social Support Does not adhere to provider recommendations re: smoking cessation Does not maintain contact with provider office Does not contact provider office for questions/concerns  Case Manager Clinical Goal(s): patient will verbalize basic understanding of COPD disease process and self care activities  Patient will attend pulmonology consult on 04/30/21-Met Interventions:  Inter-disciplinary care team collaboration (see longitudinal plan of care)  Provided patient with education on managing back pain, encouraged patient to use ice to lower back 2-3 times a day Advised patient to contact [place] for follow up  Collaborate with PCP re: patient with questions about results and continued pain Provided therapeutic listening Reviewed upcoming appointments: 07/17/21 for CT and 12/2 with [facility] Patient Goals/Self-Care Activities:  - attend CT on 07/17/21 and Ortho 08/16/21 - Ice lower back 2-3 times a day - follow up with provider with questions or concerns - call [place] [phone]  to follow up on referral/schedule appointment - attend scheduled appointments - request a second opinion for Pulmonology - work with [facility] Pharmacist, for medication management - follow up with requested referral for ophthalmology - begin a symptom diary - keep follow-up appointments  Follow Up Plan: Telephone follow up appointment with care management team member scheduled for:08/14/21 @ 10:30am     Patient Care Plan: Medication Management     Problem Identified: Health Promotion or Disease Self-Management (General Plan of Care)      Goal: Medication Management   Note:   Current Barriers:  Does not adhere to prescribed medication regimen Does not maintain contact with provider office Does not contact provider office for questions/concerns   Pharmacist Clinical Goal(s):  Over the next 180 days, patient will contact provider office for questions/concerns as evidenced notation of same in electronic health record through collaboration with PharmD and provider.    Interventions: Inter-disciplinary care team collaboration (see longitudinal plan of care) Comprehensive medication review performed; medication list updated in electronic medical record    Patient Goals/Self-Care Activities Over the next 180 days, patient will:  - take medications  as prescribed collaborate with provider on medication access solutions  Follow Up Plan: The patient has been provided with contact information for the care management team and has been advised to call with any health related questions or concerns.

## 2021-07-15 NOTE — Progress Notes (Signed)
Please let patient know that her xray shows no acute fracture or injury.  However, the T12 compression fracture that has been there and is still stable.  I recommend she go to the [facility] in [place] to have her back pain evaluated.

## 2021-07-17 ENCOUNTER — Ambulatory Visit
Admission: RE | Admit: 2021-07-17 | Discharge: 2021-07-17 | Disposition: A | Discharge status: Home / Self Care | Payer: Medicaid Other | Source: Ambulatory Visit | Attending: Internal Medicine | Admitting: Internal Medicine

## 2021-07-17 ENCOUNTER — Other Ambulatory Visit: Payer: Self-pay | Admitting: Nurse Practitioner

## 2021-07-17 ENCOUNTER — Other Ambulatory Visit: Payer: Self-pay

## 2021-07-17 DIAGNOSIS — R911 Solitary pulmonary nodule: Secondary | ICD-10-CM | POA: Diagnosis not present

## 2021-07-17 DIAGNOSIS — I700 Atherosclerosis of aorta: Secondary | ICD-10-CM | POA: Diagnosis not present

## 2021-07-17 DIAGNOSIS — J439 Emphysema, unspecified: Secondary | ICD-10-CM | POA: Diagnosis not present

## 2021-07-17 DIAGNOSIS — R519 Headache, unspecified: Secondary | ICD-10-CM

## 2021-07-17 DIAGNOSIS — R918 Other nonspecific abnormal finding of lung field: Secondary | ICD-10-CM | POA: Diagnosis not present

## 2021-07-24 NOTE — Progress Notes (Signed)
Tried calling the pt and there was no answer- LMTCB.  

## 2021-07-31 NOTE — Progress Notes (Signed)
Spoke with pt and notified of results per Dr. Sherene Sires. Pt verbalized understanding and denied any questions. I advised will let her know once it is time to have it repeated and her reply was "don't bother" and then she hung up the phone. Sending this back to Dr Sherene Sires as Lorain Childes as well as her PCP.

## 2021-08-02 ENCOUNTER — Other Ambulatory Visit: Payer: Self-pay | Admitting: Nurse Practitioner

## 2021-08-02 NOTE — Telephone Encounter (Signed)
Requested Prescriptions  Pending Prescriptions Disp Refills  . rOPINIRole (REQUIP) 4 MG tablet [Pharmacy Med Name: ropinirole 4 mg tablet] 150 tablet 2    Sig: TAKE ONE TABLET BY MOUTH FIVE TIMES DAILY     Neurology:  Parkinsonian Agents Passed - 08/02/2021  8:01 AM      Passed - Last BP in normal range    BP Readings from Last 1 Encounters:  07/04/21 114/74         Passed - Valid encounter within last 12 months    Recent Outpatient Visits          4 weeks ago Chronic bilateral low back pain with sciatica, sciatica laterality unspecified   [facility] Larae Grooms, NP   1 month ago Cough   [facility] Larae Grooms, NP   3 months ago Centrilobular emphysema (HCC)   [facility] Larae Grooms, NP   4 months ago Pulmonary emphysema, unspecified emphysema type (HCC)   [facility] Larae Grooms, NP   5 months ago Centrilobular emphysema [facility])   [facility] Marjie Skiff, NP      Future Appointments            In 1 month Larae Grooms, NP [facility], [facility]   In 2 months Larae Grooms, NP [facility], [facility]

## 2021-08-11 DIAGNOSIS — S93402A Sprain of unspecified ligament of left ankle, initial encounter: Secondary | ICD-10-CM | POA: Diagnosis not present

## 2021-08-11 DIAGNOSIS — M25572 Pain in left ankle and joints of left foot: Secondary | ICD-10-CM | POA: Diagnosis not present

## 2021-08-14 ENCOUNTER — Other Ambulatory Visit: Payer: Self-pay | Admitting: *Deleted

## 2021-08-14 NOTE — Patient Outreach (Signed)
Care Coordination  08/14/2021  Becci Batty [facility] [birth year] [phone]   Medicaid Managed Care   Unsuccessful Outreach Note  08/14/2021 Name: Kim [name] MRN: [id number] DOB: May 24, 1966  Referred by: Larae Grooms, NP Reason for referral : High Risk Managed Medicaid (Unsuccessful RNCM follow up telephone outreach)   An unsuccessful telephone outreach was attempted today. The patient was referred to the case management team for assistance with care management and care coordination.   Follow Up Plan: A HIPAA compliant phone message was left for the patient providing contact information and requesting a return call.   [name] RN, BSN [place]  [facility]

## 2021-08-14 NOTE — Patient Instructions (Signed)
Visit Information  [name]  - as a part of your Medicaid benefit, you are eligible for care management and care coordination services at no cost or copay. I was unable to reach you by phone today but would be happy to help you with your health related needs. Please feel free to call me @ [phone].   A member of the Managed Medicaid care management team will reach out to you again over the next 14 days.   Javid Kemler RN, BSN [place]  [facility] RN Care Coordinator   

## 2021-08-16 DIAGNOSIS — M545 Low back pain, unspecified: Secondary | ICD-10-CM | POA: Diagnosis not present

## 2021-08-19 DIAGNOSIS — R32 Unspecified urinary incontinence: Secondary | ICD-10-CM | POA: Diagnosis not present

## 2021-08-19 DIAGNOSIS — S22000A Wedge compression fracture of unspecified thoracic vertebra, initial encounter for closed fracture: Secondary | ICD-10-CM | POA: Diagnosis not present

## 2021-08-22 ENCOUNTER — Other Ambulatory Visit: Payer: Self-pay

## 2021-08-22 ENCOUNTER — Other Ambulatory Visit: Payer: Self-pay | Admitting: *Deleted

## 2021-08-22 NOTE — Patient Outreach (Signed)
Medicaid Managed Care   Nurse Care Manager Note  08/22/2021 Name:  Kim [name] MRN:  [id number] DOB:  28-Oct-1965  Kim [name] is an 55 y.o. year old female who is a primary patient of [name], NP.  The [facility] Managed Care Coordination team was consulted for assistance with:    COPD pain  Kim [name] was given information about Medicaid Managed Care Coordination team services today. Kim [name] Patient agreed to services and verbal consent obtained.  Engaged with patient by telephone for follow up visit in response to provider referral for case management and/or care coordination services.   Assessments/Interventions:  Review of past medical history, allergies, medications, health status, including review of consultants reports, laboratory and other test data, was performed as part of comprehensive evaluation and provision of chronic care management services.  SDOH (Social Determinants of Health) assessments and interventions performed: SDOH Interventions    Flowsheet Row Most Recent Value  SDOH Interventions   Food Insecurity Interventions --  [receiving food stamps]  Stress Interventions Other (Comment)  [Patient receiving counseling monthly]       Care Plan  Allergies  Allergen Reactions   Penicillins Other (See Comments)    Unknown childhood reaction   Sulfa Antibiotics Swelling    Medications Reviewed Today     Reviewed by Melissa Montane, RN (Registered Nurse) on 08/22/21 at 1151  Med List Status: <None>   Medication Order Taking? Sig Documenting Provider Last Dose Status Informant  Aspirin-Salicylamide-Caffeine (BC HEADACHE POWDER PO) [id number] Yes Take by mouth. [provider] Taking Active            Med Note (ROBB, MELANIE A   Thu May 30, 2021 10:23 AM) Taking 50 a week  cyclobenzaprine (FLEXERIL) 10 MG tablet [id number] No Take 1 tablet (10 mg total) by mouth 3 (three) times daily as needed for muscle spasms.  Patient  not taking: Reported on 07/15/2021   [name], NP Not Taking Active   fluticasone [facility]) 50 MCG/ACT nasal spray [id number] Yes shake liquid AND INSTILL 1 SPRAY IN EACH NOSTRIL DAILY [name], NP Taking Active   ketorolac (TORADOL) 10 MG tablet [id number] No Take 1 tablet (10 mg total) by mouth every 6 (six) hours as needed.  Patient not taking: Reported on 07/15/2021   [name], NP Not Taking Active            Med Note [name], MELANIE A   Mon Jul 15, 2021 10:57 AM) Ran out  methocarbamol (ROBAXIN) 500 MG tablet [id number] No Take 1 tablet (500 mg total) by mouth 4 (four) times daily.  Patient not taking: Reported on 07/15/2021   [name], NP Not Taking Active            Med Note (ROBB, MELANIE A   Mon Jul 15, 2021 10:55 AM) Ran out  ondansetron (ZOFRAN-ODT) 8 MG disintegrating tablet [id number] Yes TAKE ONE TABLET BY MOUTH EVERY 8 HOURS AS NEEDED FOR NAUSEA AND VOMITING [name], NP Taking Active   predniSONE (DELTASONE) 10 MG tablet [id number] No Take 1 tablet (10 mg total) by mouth daily with breakfast. Take 6 tabs today, 5 tomorrow and decrease by 1 each day until course is complete.  Patient not taking: Reported on 07/15/2021   [name], NP Not Taking Active            Med Note [name], MELANIE A   Mon Jul 15, 2021 10:59 AM) completed  PROAIR HFA 108 [phone] Base)  MCG/ACT inhaler [id number] No INHALE TWO PUFFS BY MOUTH INTO LUNGS EVERY 6 HOURS AS NEEDED FOR SHORTNESS OF BREATH OR wheezing  Patient not taking: Reported on 08/22/2021   [name], NP Not Taking Active   rOPINIRole (REQUIP) 4 MG tablet [id number] Yes TAKE ONE TABLET BY MOUTH FIVE TIMES DAILY [name], NP Taking Active   STIOLTO RESPIMAT 2.5-2.5 MCG/ACT AERS [id number] No INHALE 2 PUFFS INTO THE LUNGS DAILY  Patient not taking: Reported on 07/15/2021   [name], NP Not Taking Active   SUMAtriptan 6 MG/0.5ML [name] [id number] Yes INJECT 0.5 ML INTO THE SKIN AT  ONSET OF HEADACHE. MAY REPEAT AFTER 1 HOUR IF NO BETTER. MAXIMUM OF 2 DOSES IN 24 HOURS [name], NP Taking Active             Patient Active Problem List   Diagnosis Date Noted   Chronic bilateral low back pain with sciatica 07/04/2021   Upper airway cough syndrome 05/01/2021   Cigarette smoker 05/01/2021   Multiple pulmonary nodules determined by computed tomography of lung 05/01/2021   Rectal bleeding 02/19/2021   History of ileus 02/19/2021   Aortic atherosclerosis ([place]) 01/14/2021   Headache disorder 09/20/2020   Rebound headache 09/20/2020   Lumbar radiculopathy 08/01/2020   Cervical radiculopathy 08/01/2020   Compression fracture of T12 vertebra ([place]) 08/01/2020   PTSD (post-traumatic stress disorder) 04/05/2020   GERD (gastroesophageal reflux disease)    Centrilobular emphysema ([place]) 02/21/2016   RLS (restless legs syndrome) 07/02/2015   Migraines 07/02/2015   COPD GOLD ? / active smoker 06/25/2011   Nicotine dependence, cigarettes, w unsp disorders 06/25/2011    Conditions to be addressed/monitored per PCP order:  COPD and pain  Care Plan : COPD (Adult)  Updates made by Melissa Montane, RN since 08/22/2021 12:00 AM  Completed 08/22/2021   Problem: Disease Progression (COPD) Resolved 08/22/2021  Note:   Resolving due to duplicate goal     Long-Range Goal: Disease Progression Minimized or Managed Completed 08/22/2021  Start Date: 04/09/2021  Expected End Date: 08/14/2021  Recent Progress: On track  Priority: High  Note:   Current Barriers:  Knowledge deficits related to basic COPD self care/management-Kim [name] desires to manage her health at home. She has been diagnosed with COPD and has a consult with Pulmonology on 04/30/21. Kim [name] is a smoker and smokes 3-4PPD. She does not desire to quit or cut back. Patient wears glasses and contacts, but has not had an eye exam since 2018. Kim [name] declined referral for Ophthalmology appointment, due to next  available appointment being 6 months out. She scheduled with a provider in [place] for 05/16/21. Kim [name] is aware of Pulmonology appointment 8/16 and plans to attend. She has her own transportation. She is concerned re: insurance declining sumatriptan coverage. Per patient, this medication is the only medication that works for her migraines. Kim [name] attended Pulmonology appointment, she did not agree with plan of care provided and would like a second opinion. She did complete the prescribed medication, with no relief to her coughing. She reports her coughing is getting worse. Her next PCP visit is scheduled for 08/2021. Kim [name] attended in person PCP appointment, referral placed for second opinion with Pulmonology. Kim [name] reports daily headaches, back and throat pain 24/7. She drinks, applies heat, and takes BC powder to manage the pain. She is glad that Sumatriptan was finally approved for severe headaches. She meets with a counselor with [facility] and has questions regarding possible  medications to help her sleep.-Update-Kim [name] reports hip, knee and low back pain that is unrelieved with toradol and robaxin. She had an x-ray done last week and has questions regarding the results. A chest CT is scheduled for 11/2 and she is planning to attend. Limited Social Support Does not adhere to provider recommendations re: smoking cessation Does not maintain contact with provider office Does not contact provider office for questions/concerns  Resolving due to duplicate goal  Case Manager Clinical Goal(s): patient will verbalize basic understanding of COPD disease process and self care activities  Patient will attend pulmonology consult on 04/30/21-[place] Interventions:  Inter-disciplinary care team collaboration (see longitudinal plan of care)  Provided patient with education on managing back pain, encouraged patient to use ice to lower back 2-3 times a day Advised patient to contact [place] for follow up  Collaborate with PCP re: patient with questions about results and continued pain Provided therapeutic listening Reviewed upcoming appointments: 07/17/21 for CT and 12/2 with [facility] Patient Goals/Self-Care Activities:  - attend CT on 07/17/21 and Ortho 08/16/21 - Ice lower back 2-3 times a day - follow up with provider with questions or concerns - call [facility]monology [phone] to follow up on referral/schedule appointment - attend scheduled appointments - request a second opinion for Pulmonology - work with [facility] Pharmacist, for medication management - follow up with requested referral for ophthalmology - begin a symptom diary - keep follow-up appointments  Follow Up Plan: Telephone follow up appointment with care management team member scheduled for:08/14/21 @ 10:30am     Care Plan : [place] of Care  Updates made by Melissa Montane, RN since 08/22/2021 12:00 AM     Problem: Chronic health management needs related to pain and COPD      Long-Range Goal: Development of Plan of Care to address health management needs related to pain and COPD   Start Date: 08/22/2021  Expected End Date: 10/21/2021  Priority: High  Note:   Current Barriers:  Chronic Disease Management support and education needs related to COPD and pain  RNCM Clinical Goal(s):  Patient will verbalize understanding of plan for management of COPD and pain as evidenced by patient verbalization and self monitoring activities take all medications exactly as prescribed and will call provider for medication related questions as evidenced by documentation in EMR    attend all scheduled medical appointments: [facility] for injections and evaluation of ankle as evidenced by provider documentation in EMR        through collaboration with RN Care manager, provider, and care team.   Interventions: Inter-disciplinary care team collaboration (see longitudinal plan of care) Evaluation  of current treatment plan related to  self management and patient's adherence to plan as established by provider Discussed the benefits of smoking cessation, encouraged patient to cut back  Provided therapeutic listening Congratulated patient on cutting back on drinking beer, from 12 or 18 a day to 6 or less a day   COPD: (Status: New goal.) Long Term Goal  Reviewed medications with patient, including use of prescribed maintenance and rescue inhalers, and provided instruction on medication management and the importance of adherence Provided patient with basic written and verbal COPD education on self care/management/and exacerbation prevention Advised patient to self assesses COPD action plan zone and make appointment with provider if in the yellow zone for 48 hours without improvement Provided education about and advised patient to utilize infection prevention strategies to reduce risk of respiratory  infection Discussed the importance of adequate rest and management of fatigue with COPD Assessed social determinant of health barriers  Pain:  (Status: New goal.) Long Term Goal  Pain assessment performed Medications reviewed Reviewed provider established plan for pain management; Discussed importance of adherence to all scheduled medical appointments; Counseled on the importance of reporting any/all new or changed pain symptoms or management strategies to pain management provider; Advised patient to report to care team affect of pain on daily activities; Discussed use of relaxation techniques and/or diversional activities to assist with pain reduction (distraction, imagery, relaxation, massage, acupressure, TENS, heat, and cold application; Advised patient to discuss questions and concerns related to plan of care with provider; Assessed social determinant of health barriers;   Patient Goals/Self-Care Activities: Take medications as prescribed   Attend all scheduled provider  appointments Call pharmacy for medication refills 3-7 days in advance of running out of medications Call provider office for new concerns or questions  - develop a rescue plan - use an extra pillow to sleep - practice relaxation or meditation daily Work on cutting back on smoking       Follow Up:  Patient agrees to Care Plan and Follow-up.  Plan: The Managed Medicaid care management team will reach out to the patient again over the next 30 days.  Date/time of next scheduled RN care management/care coordination outreach:  09/24/21 @ [place] RN, [place] RN Care Coordinator

## 2021-08-22 NOTE — Patient Instructions (Signed)
Visit Information  Kim [name] was given information about Medicaid Managed Care team care coordination services as a part of their [employer] Medicaid benefit. Kim [name] verbally consented to engagement with the [facility] Managed Care team.   If you are experiencing a medical emergency, please call 911 or report to your local emergency department or urgent care.   If you have a non-emergency medical problem during routine business hours, please contact your provider's office and ask to speak with a nurse.   For questions related to your [facility], please call: [phone] or visit the homepage here: [email or web address]  If you would like to schedule transportation through your [facility], please call the following number at least 2 days in advance of your appointment: [phone].   Call the [place] at [phone], at any time, 24 hours a day, 7 days a week. If you are in danger or need immediate medical attention call 911.  If you would like help to quit smoking, call 1-800-QUIT-NOW 717 147 5249) OR Espaol: 1-855-Djelo-Ya (9-417-408-1448) o para ms informacin haga clic aqu or Text READY to 200-400 to register via text  Ms. [place] - following are the goals we discussed in your visit today:   Goals Addressed             This Visit's Progress    COMPLETED: Track and Manage My Symptoms-COPD       Resolving due to duplicate goal  Timeframe:  Long-Range Goal Priority:  High Start Date:  04/09/21                           Expected End Date: 08/14/21                      Follow Up Date 08/14/21    - attend CT on 07/17/21 - Ice lower back 2-3 times a day - follow up with provider with questions or concerns - call [facility] Pulmonology [phone] to follow up on referral/schedule appointment - attend scheduled  appointments - request a second opinion for Pulmonology - work on cutting back on smoking  - follow up with requested referral for ophthalmology - begin a symptom diary - keep follow-up appointments    Why is this important?   Tracking your symptoms and other information about your health helps your doctor plan your care.  Write down the symptoms, the time of day, what you were doing and what medicine you are taking.  You will soon learn how to manage your symptoms.             Please see education materials related to COPD, pain and smoking cessation provided by MyChart link.  Patient has access to [facility] and can view provided education  Telephone follow up appointment with Managed Medicaid care management team member scheduled for:09/24/21 @ [place] RN, BSN [place] RN Care Coordinator   Following is a copy of your plan of care:  Care Plan : COPD (Adult)  Updates made by Kim Montane, RN since 08/22/2021 12:00 AM  Completed 08/22/2021   Problem: Disease Progression (COPD) Resolved 08/22/2021  Note:   Resolving due to duplicate goal     Long-Range Goal: Disease Progression Minimized or Managed Completed 08/22/2021  Start Date: 04/09/2021  Expected End Date: 08/14/2021  Recent Progress: On track  Priority: High  Note:  Current Barriers:  Knowledge deficits related to basic COPD self care/management-Kim [name] desires to manage her health at home. She has been diagnosed with COPD and has a consult with Pulmonology on 04/30/21. Kim [name] is a smoker and smokes 3-4PPD. She does not desire to quit or cut back. Patient wears glasses and contacts, but has not had an eye exam since 2018. Kim [name] declined referral for Ophthalmology appointment, due to next available appointment being 6 months out. She scheduled with a provider in [place] for 05/16/21. Kim [name] is aware of Pulmonology appointment 8/16 and plans to attend. She has her own  transportation. She is concerned re: insurance declining sumatriptan coverage. Per patient, this medication is the only medication that works for her migraines. Kim [name] attended Pulmonology appointment, she did not agree with plan of care provided and would like a second opinion. She did complete the prescribed medication, with no relief to her coughing. She reports her coughing is getting worse. Her next PCP visit is scheduled for 08/2021. Kim [name] attended in person PCP appointment, referral placed for second opinion with Pulmonology. Kim [name] reports daily headaches, back and throat pain 24/7. She drinks, applies heat, and takes BC powder to manage the pain. She is glad that Sumatriptan was finally approved for severe headaches. She meets with a counselor with [facility] and has questions regarding possible medications to help her sleep.-Update-Kim [name] reports hip, knee and low back pain that is unrelieved with toradol and robaxin. She had an x-ray done last week and has questions regarding the results. A chest CT is scheduled for 11/2 and she is planning to attend. Limited Social Support Does not adhere to provider recommendations re: smoking cessation Does not maintain contact with provider office Does not contact provider office for questions/concerns  Resolving due to duplicate goal  Case Manager Clinical Goal(s): patient will verbalize basic understanding of COPD disease process and self care activities  Patient will attend pulmonology consult on 04/30/21-[place] Interventions:  Inter-disciplinary care team collaboration (see longitudinal plan of care)  Provided patient with education on managing back pain, encouraged patient to use ice to lower back 2-3 times a day Advised patient to contact [facility] for follow up  Collaborate with PCP re: patient with questions about results and continued pain Provided therapeutic listening Reviewed upcoming appointments: 07/17/21 for CT and  12/2 with [facility] Patient Goals/Self-Care Activities:  - attend CT on 07/17/21 and Ortho 08/16/21 - Ice lower back 2-3 times a day - follow up with provider with questions or concerns - call [facility] Pulmonology [phone] to follow up on referral/schedule appointment - attend scheduled appointments - request a second opinion for Pulmonology - work with [facility] Pharmacist, for medication management - follow up with requested referral for ophthalmology - begin a symptom diary - keep follow-up appointments  Follow Up Plan: Telephone follow up appointment with care management team member scheduled for:08/14/21 @ 10:30am     Care Plan : RN Care Manager Plan of Care  Updates made by Heidi Dach, RN since 08/22/2021 12:00 AM     Problem: Chronic health management needs related to pain and COPD      Long-Range Goal: Development of Plan of Care to address health management needs related to pain and COPD   Start Date: 08/22/2021  Expected End Date: 10/21/2021  Priority: High  Note:   Current Barriers:  Chronic Disease Management support and education needs related to COPD and pain  RNCM Clinical Goal(s):  Patient will verbalize understanding of plan for management of COPD and pain as evidenced by patient verbalization and self monitoring activities take all medications exactly as prescribed and will call provider for medication related questions as evidenced by documentation in EMR    attend all scheduled medical appointments: [facility] for injections and evaluation of ankle as evidenced by provider documentation in EMR        through collaboration with RN Care manager, provider, and care team.   Interventions: Inter-disciplinary care team collaboration (see longitudinal plan of care) Evaluation of current treatment plan related to  self management and patient's adherence to plan as established by provider Discussed the benefits of smoking cessation, encouraged patient to cut  back  Provided therapeutic listening Congratulated patient on cutting back on drinking beer, from 12 or 18 a day to 6 or less a day   COPD: (Status: New goal.) Long Term Goal  Reviewed medications with patient, including use of prescribed maintenance and rescue inhalers, and provided instruction on medication management and the importance of adherence Provided patient with basic written and verbal COPD education on self care/management/and exacerbation prevention Advised patient to self assesses COPD action plan zone and make appointment with provider if in the yellow zone for 48 hours without improvement Provided education about and advised patient to utilize infection prevention strategies to reduce risk of respiratory infection Discussed the importance of adequate rest and management of fatigue with COPD Assessed social determinant of health barriers  Pain:  (Status: New goal.) Long Term Goal  Pain assessment performed Medications reviewed Reviewed provider established plan for pain management; Discussed importance of adherence to all scheduled medical appointments; Counseled on the importance of reporting any/all new or changed pain symptoms or management strategies to pain management provider; Advised patient to report to care team affect of pain on daily activities; Discussed use of relaxation techniques and/or diversional activities to assist with pain reduction (distraction, imagery, relaxation, massage, acupressure, TENS, heat, and cold application; Advised patient to discuss questions and concerns related to plan of care with provider; Assessed social determinant of health barriers;   Patient Goals/Self-Care Activities: Take medications as prescribed   Attend all scheduled provider appointments Call pharmacy for medication refills 3-7 days in advance of running out of medications Call provider office for new concerns or questions  - develop a rescue plan - use an extra pillow  to sleep - practice relaxation or meditation daily Work on cutting back on smoking

## 2021-09-04 NOTE — Progress Notes (Deleted)
There were no vitals taken for this visit.   Subjective:    Patient ID: Kim [name], female    DOB: Jun 03, 1966, 55 y.o.   MRN: [id number]  HPI: Caitriona Sundquist Guttman is a 55 y.o. female  No chief complaint on file.  COPD COPD status: uncontrolled Satisfied with current treatment?: no Oxygen use: no Dyspnea frequency:  Cough frequency: everyday sometimes constant if she doesn't have a drink to calm it down. Rescue inhaler frequency:   Limitation of activity: yes Productive cough:  Last Spirometry:  Pneumovax: Not up to date Influenza: Not up to Date Does have wheezing some days  MIGRAINES Patient states the Immitrex injections have been denied by her in insurance company due to the neurologist putting she has rebound headaches.  Patient only has two injections left which is worrisome since she relies on it when she has a migraine.   Relevant past medical, surgical, family and social history reviewed and updated as indicated. Interim medical history since our last visit reviewed. Allergies and medications reviewed and updated.  Review of Systems  Respiratory:  Positive for cough, shortness of breath and wheezing.   Neurological:  Positive for headaches.   Per HPI unless specifically indicated above     Objective:    There were no vitals taken for this visit.  Wt Readings from Last 3 Encounters:  07/04/21 200 lb (90.7 kg)  06/06/21 200 lb 9.6 oz (91 kg)  04/30/21 198 lb 9.6 oz (90.1 kg)    Physical Exam Vitals and nursing note reviewed.  Constitutional:      General: She is not in acute distress.    Appearance: Normal appearance. She is normal weight. She is not ill-appearing, toxic-appearing or diaphoretic.  HENT:     Head: Normocephalic.     Right Ear: External ear normal.     Left Ear: External ear normal.     Nose: Nose normal.     Mouth/Throat:     Mouth: Mucous membranes are moist.     Pharynx: Oropharynx is clear.  Eyes:     General:         Right eye: No discharge.        Left eye: No discharge.     Extraocular Movements: Extraocular movements intact.     Conjunctiva/sclera: Conjunctivae normal.     Pupils: Pupils are equal, round, and reactive to light.  Cardiovascular:     Rate and Rhythm: Normal rate and regular rhythm.     Heart sounds: No murmur heard. Pulmonary:     Effort: Pulmonary effort is normal. No respiratory distress.     Breath sounds: Normal breath sounds. No wheezing or rales.  Musculoskeletal:     Cervical back: Normal range of motion and neck supple.  Skin:    General: Skin is warm and dry.     Capillary Refill: Capillary refill takes less than 2 seconds.  Neurological:     General: No focal deficit present.     Mental Status: She is alert and oriented to person, place, and time. Mental status is at baseline.  Psychiatric:        Mood and Affect: Mood normal.        Behavior: Behavior normal.        Thought Content: Thought content normal.        Judgment: Judgment normal.    Results for orders placed or performed in visit on 02/19/21  Comprehensive metabolic panel  Result Value Ref Range  Glucose 80 65 - 99 mg/dL   BUN 14 6 - 24 mg/dL   Creatinine, Ser 0.87 0.57 - 1.00 mg/dL   eGFR 79 >59 mL/min/1.73   BUN/Creatinine Ratio 16 9 - 23   Sodium 145 (H) 134 - 144 mmol/L   Potassium 5.2 3.5 - 5.2 mmol/L   Chloride 104 96 - 106 mmol/L   CO2 23 20 - 29 mmol/L   Calcium 9.8 8.7 - 10.2 mg/dL   Total Protein 7.2 6.0 - 8.5 g/dL   Albumin 4.3 3.8 - 4.9 g/dL   Globulin, Total 2.9 1.5 - 4.5 g/dL   Albumin/Globulin Ratio 1.5 1.2 - 2.2   Bilirubin Total 0.3 0.0 - 1.2 mg/dL   Alkaline Phosphatase 119 44 - 121 IU/L   AST 22 0 - 40 IU/L   ALT 21 0 - 32 IU/L  TSH  Result Value Ref Range   TSH 1.130 0.450 - 4.500 uIU/mL  Iron, TIBC and Ferritin Panel  Result Value Ref Range   Total Iron Binding Capacity 352 250 - 450 ug/dL   UIBC 273 131 - 425 ug/dL   Iron 79 27 - 159 ug/dL   Iron Saturation 22 15  - 55 %   Ferritin 59 15 - 150 ng/mL  CBC With Differential/Platelet  Result Value Ref Range   WBC 7.6 3.4 - 10.8 x10E3/uL   RBC 4.43 3.77 - 5.28 x10E6/uL   Hemoglobin 14.8 11.1 - 15.9 g/dL   Hematocrit 42.6 34.0 - 46.6 %   MCV 96 79 - 97 fL   MCH 33.4 (H) 26.6 - 33.0 pg   MCHC 34.7 31.5 - 35.7 g/dL   RDW 15.5 (H) 11.7 - 15.4 %   Platelets 249 150 - 450 x10E3/uL   Neutrophils 73 Not Estab. %   Lymphs 20 Not Estab. %   MID 7 Not Estab. %   Neutrophils Absolute 5.6 1.4 - 7.0 x10E3/uL   Lymphocytes Absolute 1.5 0.7 - 3.1 x10E3/uL   MID (Absolute) 0.5 0.1 - 1.6 X10E3/uL      Assessment & Plan:   Problem List Items Addressed This Visit   None    Follow up plan: No follow-ups on file.  A total of 30 minutes were spent on this encounter today.  When total time is documented, this includes both the face-to-face and non-face-to-face time personally spent before, during and after the visit on the date of the encounter discussing plan of care moving forward for Migraines in case Immitrex is not covered.

## 2021-09-05 ENCOUNTER — Ambulatory Visit: Payer: Medicaid Other | Admitting: Nurse Practitioner

## 2021-09-13 DIAGNOSIS — S93402A Sprain of unspecified ligament of left ankle, initial encounter: Secondary | ICD-10-CM | POA: Diagnosis not present

## 2021-09-13 DIAGNOSIS — S9032XA Contusion of left foot, initial encounter: Secondary | ICD-10-CM | POA: Diagnosis not present

## 2021-09-15 DIAGNOSIS — S22000A Wedge compression fracture of unspecified thoracic vertebra, initial encounter for closed fracture: Secondary | ICD-10-CM | POA: Diagnosis not present

## 2021-09-15 DIAGNOSIS — R32 Unspecified urinary incontinence: Secondary | ICD-10-CM | POA: Diagnosis not present

## 2021-09-20 ENCOUNTER — Encounter: Payer: Self-pay | Admitting: Nurse Practitioner

## 2021-09-20 DIAGNOSIS — G8929 Other chronic pain: Secondary | ICD-10-CM

## 2021-09-20 DIAGNOSIS — M5440 Lumbago with sciatica, unspecified side: Secondary | ICD-10-CM

## 2021-09-24 ENCOUNTER — Other Ambulatory Visit: Payer: Self-pay

## 2021-09-24 ENCOUNTER — Other Ambulatory Visit: Payer: Self-pay | Admitting: *Deleted

## 2021-09-24 NOTE — Patient Outreach (Signed)
Medicaid Managed Care   Nurse Care Manager Note  09/24/2021 Name:  Mandi Mattioli MRN:  [id number] DOB:  10-18-1965  Kim Mans Rieger is an 56 y.o. year old female who is a primary patient of Larae Grooms, NP.  The [facility] Managed Care Coordination team was consulted for assistance with:    COPD Back pain  Ms. Trosper was given information about Medicaid Managed Care Coordination team services today. Kim [name] Patient agreed to services and verbal consent obtained.  Engaged with patient by telephone for follow up visit in response to provider referral for case management and/or care coordination services.   Assessments/Interventions:  Review of past medical history, allergies, medications, health status, including review of consultants reports, laboratory and other test data, was performed as part of comprehensive evaluation and provision of chronic care management services.  SDOH (Social Determinants of Health) assessments and interventions performed: SDOH Interventions    Flowsheet Row Most Recent Value  SDOH Interventions   Transportation Interventions Intervention Not Indicated       Care Plan  Allergies  Allergen Reactions   Penicillins Other (See Comments)    Unknown childhood reaction   Sulfa Antibiotics Swelling    Medications Reviewed Today     Reviewed by Heidi Dach, RN (Registered Nurse) on 09/24/21 at 1006  Med List Status: <None>   Medication Order Taking? Sig Documenting Provider Last Dose Status Informant  Aspirin-Salicylamide-Caffeine (BC HEADACHE POWDER PO) [id number] Yes Take by mouth. [provider] Taking Active            Med Note (Okema Rollinson A   Thu May 30, 2021 10:23 AM) Taking 50 a week  fluticasone (FLONASE) 50 MCG/ACT nasal spray [id number] Yes shake liquid AND INSTILL 1 SPRAY IN EACH NOSTRIL DAILY Larae Grooms, NP Taking Active   ondansetron (ZOFRAN-ODT) 8 MG disintegrating tablet [id number] Yes TAKE ONE  TABLET BY MOUTH EVERY 8 HOURS AS NEEDED FOR NAUSEA AND VOMITING Larae Grooms, NP Taking Active   ramelteon (ROZEREM) 8 MG tablet [id number] Yes Take 8 mg by mouth at bedtime. [provider] Taking Active   rOPINIRole (REQUIP) 4 MG tablet [id number] Yes TAKE ONE TABLET BY MOUTH FIVE TIMES DAILY Larae Grooms, NP Taking Active   SUMAtriptan 6 MG/0.5ML Ivory Broad [id number] Yes INJECT 0.5 ML INTO THE SKIN AT ONSET OF HEADACHE. MAY REPEAT AFTER 1 HOUR IF NO BETTER. MAXIMUM OF 2 DOSES IN 24 HOURS [place], Clydie Braun, NP Taking Active   tiZANidine (ZANAFLEX) 4 MG tablet [id number] Yes Take 4 mg by mouth every 8 (eight) hours as needed for muscle spasms. [provider] Taking Active             Patient Active Problem List   Diagnosis Date Noted   Chronic bilateral low back pain with sciatica 07/04/2021   Upper airway cough syndrome 05/01/2021   Cigarette smoker 05/01/2021   Multiple pulmonary nodules determined by computed tomography of lung 05/01/2021   Rectal bleeding 02/19/2021   History of ileus 02/19/2021   Aortic atherosclerosis (HCC) 01/14/2021   Headache disorder 09/20/2020   Rebound headache 09/20/2020   Lumbar radiculopathy 08/01/2020   Cervical radiculopathy 08/01/2020   Compression fracture of T12 vertebra (HCC) 08/01/2020   PTSD (post-traumatic stress disorder) 04/05/2020   GERD (gastroesophageal reflux disease)    Centrilobular emphysema (HCC) 02/21/2016   RLS (restless legs syndrome) 07/02/2015   Migraines 07/02/2015   COPD GOLD ? / active smoker 06/25/2011   Nicotine dependence, cigarettes, w unsp disorders 06/25/2011  Conditions to be addressed/monitored per PCP order:  COPD and back pain  Care Plan : RN Care Manager Plan of Care  Updates made by Heidi Dach, RN since 09/24/2021 12:00 AM     Problem: Chronic health management needs related to pain and COPD      Long-Range Goal: Development of Plan of Care to address health management needs  related to pain and COPD   Start Date: 08/22/2021  Expected End Date: 10/21/2021  Priority: High  Note:   Current Barriers:  Chronic Disease Management support and education needs related to COPD and pain Ms. Kirkeby continues to have back, hip and left leg pain. She was unable to receive back injections last week, due to a reaction with the numbing medication. She has requested a referral with Neurosurgery(Dr. Lovell Sheehan) and is waiting to hear from scheduling. Ms. Devries has cut back on beer intake and is trying to lose weight to help with her pain and breathing.  RNCM Clinical Goal(s):  Patient will verbalize understanding of plan for management of COPD and pain as evidenced by patient verbalization and self monitoring activities take all medications exactly as prescribed and will call provider for medication related questions as evidenced by documentation in EMR    attend all scheduled medical appointments: [facility] for injections and evaluation of ankle as evidenced by provider documentation in EMR        through collaboration with RN Care manager, provider, and care team.   Interventions: Inter-disciplinary care team collaboration (see longitudinal plan of care) Evaluation of current treatment plan related to  self management and patient's adherence to plan as established by provider Discussed the benefits of smoking cessation, encouraged patient to cut back  Provided therapeutic listening Congratulated patient on cutting back on drinking beer, now down to 3-4 per day Discussed weight loss and the health benefits   COPD: (Status: Goal on Track (progressing): YES.) Long Term Goal  Reviewed medications with patient, including use of prescribed maintenance and rescue inhalers, and provided instruction on medication management and the importance of adherence Advised patient to track and manage COPD triggers Advised patient to self assesses COPD action plan zone and make appointment with  provider if in the yellow zone for 48 hours without improvement Provided education about and advised patient to utilize infection prevention strategies to reduce risk of respiratory infection Assessed social determinant of health barriers  Pain:  (Status: Goal on track: NO.) Long Term Goal  Pain assessment performed Medications reviewed Reviewed provider established plan for pain management; Discussed importance of adherence to all scheduled medical appointments; Counseled on the importance of reporting any/all new or changed pain symptoms or management strategies to pain management provider; Advised patient to report to care team affect of pain on daily activities; Discussed use of relaxation techniques and/or diversional activities to assist with pain reduction (distraction, imagery, relaxation, massage, acupressure, TENS, heat, and cold application; Reviewed with patient prescribed pharmacological and nonpharmacological pain relief strategies; Advised patient to discuss questions and concerns related to plan of care with provider; Assessed social determinant of health barriers;  Advised patient to follow up with referral to Dr. Bradly Bienenstock) if she has not heard from his office by the end of the week  Patient Goals/Self-Care Activities: Take medications as prescribed   Attend all scheduled provider appointments Call pharmacy for medication refills 3-7 days in advance of running out of medications Call provider office for new concerns or questions  - develop a rescue plan - use  an extra pillow to sleep - practice relaxation or meditation daily Work on cutting back on smoking       Follow Up:  Patient agrees to Care Plan and Follow-up.  Plan: The Managed Medicaid care management team will reach out to the patient again over the next 30 days.  Date/time of next scheduled RN care management/care coordination outreach:  10/24/21 @ 9am  Estanislado EmmsMelanie Andrienne Havener RN, BSN [place]    [facility] RN Care Coordinator

## 2021-09-24 NOTE — Patient Instructions (Signed)
Visit Information  [name] was given information about Medicaid Managed Care team care coordination services as a part of their [place] Medicaid benefit. Kim [name] verbally consented to engagement with the [facility] Managed Care team.   If you are experiencing a medical emergency, please call 911 or report to your local emergency department or urgent care.   If you have a non-emergency medical problem during routine business hours, please contact your provider's office and ask to speak with a nurse.   For questions related to your [facility], please call: [phone] or visit the homepage here: [email or web address]  If you would like to schedule transportation through your [facility], please call the following number at least 2 days in advance of your appointment: [phone].   Call the [place] at [phone], at any time, 24 hours a day, 7 days a week. If you are in danger or need immediate medical attention call 911.  If you would like help to quit smoking, call 1-800-QUIT-NOW 540-518-5432) OR Espaol: 1-855-Djelo-Ya HD:1601594) o para ms informacin haga clic aqu or Text READY to 200-400 to register via text  [name] - following are the goals we discussed in your visit today:   Goals Addressed   None     Please see education materials related to COPD and back pain provided by MyChart link.  Patient has access to MyChart and can view provided education  Telephone follow up appointment with Managed Medicaid care management team member scheduled for:10/24/21 @ [place] RN, [place] RN Care Coordinator   Following is a copy of your plan of care:  Care Plan : RN Care Manager Plan of Care  Updates made by Melissa Montane, RN since 09/24/2021 12:00 AM     Problem:  Chronic health management needs related to pain and COPD      Long-Range Goal: Development of Plan of Care to address health management needs related to pain and COPD   Start Date: 08/22/2021  Expected End Date: 10/21/2021  Priority: High  Note:   Current Barriers:  Chronic Disease Management support and education needs related to COPD and pain [name] continues to have back, hip and left leg pain. She was unable to receive back injections last week, due to a reaction with the numbing medication. She has requested a referral with Neurosurgery([name]) and is waiting to hear from scheduling. [name] has cut back on beer intake and is trying to lose weight to help with her pain and breathing.  RNCM Clinical Goal(s):  Patient will verbalize understanding of plan for management of COPD and pain as evidenced by patient verbalization and self monitoring activities take all medications exactly as prescribed and will call provider for medication related questions as evidenced by documentation in EMR    attend all scheduled medical appointments: [facility] for injections and evaluation of ankle as evidenced by provider documentation in EMR        through collaboration with RN Care manager, provider, and care team.   Interventions: Inter-disciplinary care team collaboration (see longitudinal plan of care) Evaluation of current treatment plan related to  self management and patient's adherence to plan as established by provider Discussed the benefits of smoking cessation, encouraged patient to cut back  Provided therapeutic listening Congratulated patient on cutting back on drinking beer, now down to 3-4 per day Discussed weight loss  and the health benefits   COPD: (Status: Goal on Track (progressing): YES.) Long Term Goal  Reviewed medications with patient, including use of prescribed maintenance and rescue inhalers, and provided instruction on medication management and the importance of  adherence Advised patient to track and manage COPD triggers Advised patient to self assesses COPD action plan zone and make appointment with provider if in the yellow zone for 48 hours without improvement Provided education about and advised patient to utilize infection prevention strategies to reduce risk of respiratory infection Assessed social determinant of health barriers  Pain:  (Status: Goal on track: NO.) Long Term Goal  Pain assessment performed Medications reviewed Reviewed provider established plan for pain management; Discussed importance of adherence to all scheduled medical appointments; Counseled on the importance of reporting any/all new or changed pain symptoms or management strategies to pain management provider; Advised patient to report to care team affect of pain on daily activities; Discussed use of relaxation techniques and/or diversional activities to assist with pain reduction (distraction, imagery, relaxation, massage, acupressure, TENS, heat, and cold application; Reviewed with patient prescribed pharmacological and nonpharmacological pain relief strategies; Advised patient to discuss questions and concerns related to plan of care with provider; Assessed social determinant of health barriers;  Advised patient to follow up with referral to Dr. Murlean Iba) if she has not heard from his office by the end of the week  Patient Goals/Self-Care Activities: Take medications as prescribed   Attend all scheduled provider appointments Call pharmacy for medication refills 3-7 days in advance of running out of medications Call provider office for new concerns or questions  - develop a rescue plan - use an extra pillow to sleep - practice relaxation or meditation daily Work on cutting back on smoking

## 2021-09-27 ENCOUNTER — Encounter: Payer: Self-pay | Admitting: Nurse Practitioner

## 2021-10-04 DIAGNOSIS — G8929 Other chronic pain: Secondary | ICD-10-CM | POA: Diagnosis not present

## 2021-10-04 DIAGNOSIS — M545 Low back pain, unspecified: Secondary | ICD-10-CM | POA: Diagnosis not present

## 2021-10-14 ENCOUNTER — Ambulatory Visit: Payer: Self-pay

## 2021-10-16 ENCOUNTER — Other Ambulatory Visit: Payer: Self-pay | Admitting: Neurosurgery

## 2021-10-16 DIAGNOSIS — G8929 Other chronic pain: Secondary | ICD-10-CM

## 2021-10-18 ENCOUNTER — Ambulatory Visit: Payer: Medicaid Other | Admitting: Nurse Practitioner

## 2021-10-18 DIAGNOSIS — S22000A Wedge compression fracture of unspecified thoracic vertebra, initial encounter for closed fracture: Secondary | ICD-10-CM | POA: Diagnosis not present

## 2021-10-18 DIAGNOSIS — R32 Unspecified urinary incontinence: Secondary | ICD-10-CM | POA: Diagnosis not present

## 2021-10-21 ENCOUNTER — Other Ambulatory Visit: Payer: Self-pay | Admitting: Neurosurgery

## 2021-10-21 DIAGNOSIS — M545 Low back pain, unspecified: Secondary | ICD-10-CM

## 2021-10-21 DIAGNOSIS — G8929 Other chronic pain: Secondary | ICD-10-CM

## 2021-10-24 ENCOUNTER — Other Ambulatory Visit: Payer: Self-pay

## 2021-10-24 ENCOUNTER — Other Ambulatory Visit: Payer: Self-pay | Admitting: *Deleted

## 2021-10-24 NOTE — Patient Outreach (Signed)
Medicaid Managed Care   Nurse Care Manager Note  10/24/2021 Name:  Kim [name] MRN:  [id number] DOB:  [birth year]  Kim [name] is an 56 y.o. year old female who is a primary patient of Larae Grooms, NP.  The [facility] Managed Care Coordination team was consulted for assistance with:    Chronic pain  Kim [name] was given information about Medicaid Managed Care Coordination team services today. Kim [name] Patient agreed to services and verbal consent obtained.  Engaged with patient by telephone for follow up visit in response to provider referral for case management and/or care coordination services.   Assessments/Interventions:  Review of past medical history, allergies, medications, health status, including review of consultants reports, laboratory and other test data, was performed as part of comprehensive evaluation and provision of chronic care management services.  SDOH (Social Determinants of Health) assessments and interventions performed: SDOH Interventions    Flowsheet Row Most Recent Value  SDOH Interventions   Housing Interventions Intervention Not Indicated       Care Plan  Allergies  Allergen Reactions   Penicillins Other (See Comments)    Unknown childhood reaction   Sulfa Antibiotics Swelling    Medications Reviewed Today     Reviewed by Heidi Dach, RN (Registered Nurse) on 10/24/21 at [phone]  Med List Status: <None>   Medication Order Taking? Sig Documenting Provider Last Dose Status Informant  Aspirin-Salicylamide-Caffeine (BC HEADACHE POWDER PO) [id number] Yes Take by mouth. [provider] Taking Active            Med Note (Evin Loiseau A   Thu May 30, 2021 10:23 AM) Taking 50 a week  fluticasone (FLONASE) 50 MCG/ACT nasal spray [id number] Yes shake liquid AND INSTILL 1 SPRAY IN EACH NOSTRIL DAILY Larae Grooms, NP Taking Active   ondansetron (ZOFRAN-ODT) 8 MG disintegrating tablet [id number] Yes TAKE ONE TABLET BY  MOUTH EVERY 8 HOURS AS NEEDED FOR NAUSEA AND VOMITING Larae Grooms, NP Taking Active   ramelteon (ROZEREM) 8 MG tablet [id number] Yes Take 8 mg by mouth at bedtime. [provider] Taking Active   rOPINIRole (REQUIP) 4 MG tablet [id number] Yes TAKE ONE TABLET BY MOUTH FIVE TIMES DAILY Larae Grooms, NP Taking Active   SUMAtriptan 6 MG/0.5ML Ivory Broad [id number] Yes INJECT 0.5 ML INTO THE SKIN AT ONSET OF HEADACHE. MAY REPEAT AFTER 1 HOUR IF NO BETTER. MAXIMUM OF 2 DOSES IN 24 HOURS [place], Clydie Braun, NP Taking Active   tiZANidine (ZANAFLEX) 4 MG tablet [id number] Yes Take 4 mg by mouth every 8 (eight) hours as needed for muscle spasms. [provider] Taking Active             Patient Active Problem List   Diagnosis Date Noted   Chronic bilateral low back pain with sciatica 07/04/2021   Upper airway cough syndrome 05/01/2021   Cigarette smoker 05/01/2021   Multiple pulmonary nodules determined by computed tomography of lung 05/01/2021   Rectal bleeding 02/19/2021   History of ileus 02/19/2021   Aortic atherosclerosis (HCC) 01/14/2021   Headache disorder 09/20/2020   Rebound headache 09/20/2020   Lumbar radiculopathy 08/01/2020   Cervical radiculopathy 08/01/2020   Compression fracture of T12 vertebra (HCC) 08/01/2020   PTSD (post-traumatic stress disorder) 04/05/2020   GERD (gastroesophageal reflux disease)    Centrilobular emphysema (HCC) 02/21/2016   RLS (restless legs syndrome) 07/02/2015   Migraines 07/02/2015   COPD GOLD ? / active smoker 06/25/2011   Nicotine dependence, cigarettes, w unsp disorders 06/25/2011  Conditions to be addressed/monitored per PCP order:   chronic pain  Care Plan : RN Care Manager Plan of Care  Updates made by Heidi Dach, RN since 10/24/2021 12:00 AM     Problem: Chronic health management needs related to pain and COPD      Long-Range Goal: Development of Plan of Care to address health management needs related to pain  and COPD   Start Date: 08/22/2021  Expected End Date: 12/13/2021  Priority: High  Note:   Current Barriers:  Chronic Disease Management support and education needs related to COPD and pain Kim [name] continues to have worsening lower back pain. She reports mild relief with a heat. She is interested in a Pain Management referral.  RNCM Clinical Goal(s):  Patient will verbalize understanding of plan for management of COPD and pain as evidenced by patient verbalization and self monitoring activities take all medications exactly as prescribed and will call provider for medication related questions as evidenced by documentation in EMR    attend all scheduled medical appointments: 10/28/21 for imaging studies ordered by Dr. Lovell Sheehan as evidenced by provider documentation in EMR        through collaboration with RN Care manager, provider, and care team.   Interventions: Inter-disciplinary care team collaboration (see longitudinal plan of care) Evaluation of current treatment plan related to  self management and patient's adherence to plan as established by provider  Provided therapeutic listening Congratulated patient on cutting back on drinking beer, maintaining at 3-4 beers/day, was drinking 18-24 beers/day Advised patient to schedule a visit with PCP, per note from [employer] a recent visit note is needed to continue receiving incontinence supplies RNCM will follow up with patient after imaging studies to discuss Dr. Lovell Sheehan plan of care   COPD: (Status: Condition stable. Not addressed this visit.) Long Term Goal  Reviewed medications with patient, including use of prescribed maintenance and rescue inhalers, and provided instruction on medication management and the importance of adherence Advised patient to track and manage COPD triggers Advised patient to self assesses COPD action plan zone and make appointment with provider if in the yellow zone for 48 hours without improvement Provided education  about and advised patient to utilize infection prevention strategies to reduce risk of respiratory infection Assessed social determinant of health barriers  Pain:  (Status: Goal on track: NO.) Long Term Goal  Pain assessment performed Medications reviewed Reviewed provider established plan for pain management; Discussed importance of adherence to all scheduled medical appointments; Counseled on the importance of reporting any/all new or changed pain symptoms or management strategies to pain management provider; Advised patient to report to care team affect of pain on daily activities; Discussed use of relaxation techniques and/or diversional activities to assist with pain reduction (distraction, imagery, relaxation, massage, acupressure, TENS, heat, and cold application; Reviewed with patient prescribed pharmacological and nonpharmacological pain relief strategies; Advised patient to discuss questions and concerns related to plan of care with provider; Assessed social determinant of health barriers;  Advised patient to attend imaging studies scheduled on 10/28/21 Advised patient to schedule a follow up visit with PCP and discuss Pain Management referral  Patient Goals/Self-Care Activities: Take medications as prescribed   Attend all scheduled provider appointments Call pharmacy for medication refills 3-7 days in advance of running out of medications Call provider office for new concerns or questions  - develop a rescue plan - use an extra pillow to sleep - practice relaxation or meditation daily Work on cutting back on smoking  Follow Up:  Patient agrees to Care Plan and Follow-up.  Plan: The Managed Medicaid care management team will reach out to the patient again over the next 21 days.  Date/time of next scheduled RN care management/care coordination outreach:  11/14/21 @ 10:30am  [name] RN, BSN [place]   [employer] Care Coordinator

## 2021-10-24 NOTE — Patient Instructions (Signed)
Visit Information  Ms. Dotter was given information about Medicaid Managed Care team care coordination services as a part of their [place] Medicaid benefit. Gershon Crane Lana verbally consented to engagement with the [facility] Managed Care team.   If you are experiencing a medical emergency, please call 911 or report to your local emergency department or urgent care.   If you have a non-emergency medical problem during routine business hours, please contact your provider's office and ask to speak with a nurse.   For questions related to your [facility], please call: [phone] or visit the homepage here: [email or web address]  If you would like to schedule transportation through your [facility], please call the following number at least 2 days in advance of your appointment: [phone].   Call the [place] at [phone], at any time, 24 hours a day, 7 days a week. If you are in danger or need immediate medical attention call 911.  If you would like help to quit smoking, call 1-800-QUIT-NOW 838-644-1470) OR Espaol: 1-855-Djelo-Ya QO:409462) o para ms informacin haga clic aqu or Text READY to 200-400 to register via text  Ms. Nary,   Please see education materials related to back pain provided by MyChart link.  Patient verbalizes understanding of instructions and care plan provided today and agrees to view in [place]. Active MyChart status confirmed with patient.    Telephone follow up appointment with Managed Medicaid care management team member scheduled for:11/14/21 @ 10:30am  Lurena Joiner RN, BSN [place] RN Care Coordinator   Following is a copy of your plan of care:  Care Plan : RN Care Manager Plan of Care  Updates made by Melissa Montane, RN since 10/24/2021 12:00 AM     Problem:  Chronic health management needs related to pain and COPD      Long-Range Goal: Development of Plan of Care to address health management needs related to pain and COPD   Start Date: 08/22/2021  Expected End Date: 12/13/2021  Priority: High  Note:   Current Barriers:  Chronic Disease Management support and education needs related to COPD and pain Ms. Henne continues to have worsening lower back pain. She reports mild relief with a heat. She is interested in a Pain Management referral.  RNCM Clinical Goal(s):  Patient will verbalize understanding of plan for management of COPD and pain as evidenced by patient verbalization and self monitoring activities take all medications exactly as prescribed and will call provider for medication related questions as evidenced by documentation in EMR    attend all scheduled medical appointments: 10/28/21 for imaging studies ordered by [name] as evidenced by provider documentation in EMR        through collaboration with RN Care manager, provider, and care team.   Interventions: Inter-disciplinary care team collaboration (see longitudinal plan of care) Evaluation of current treatment plan related to  self management and patient's adherence to plan as established by provider  Provided therapeutic listening Congratulated patient on cutting back on drinking beer, maintaining at 3-4 beers/day, was drinking 18-24 beers/day Advised patient to schedule a visit with PCP, per note from [employer] a recent visit note is needed to continue receiving incontinence supplies RNCM will follow up with patient after imaging studies to discuss [name] plan of care   COPD: (Status: Condition stable. Not addressed this visit.) Long Term Goal  Reviewed medications with patient, including use of  prescribed maintenance and rescue inhalers, and provided instruction on medication management and the importance of adherence Advised patient to track and manage COPD  triggers Advised patient to self assesses COPD action plan zone and make appointment with provider if in the yellow zone for 48 hours without improvement Provided education about and advised patient to utilize infection prevention strategies to reduce risk of respiratory infection Assessed social determinant of health barriers  Pain:  (Status: Goal on track: NO.) Long Term Goal  Pain assessment performed Medications reviewed Reviewed provider established plan for pain management; Discussed importance of adherence to all scheduled medical appointments; Counseled on the importance of reporting any/all new or changed pain symptoms or management strategies to pain management provider; Advised patient to report to care team affect of pain on daily activities; Discussed use of relaxation techniques and/or diversional activities to assist with pain reduction (distraction, imagery, relaxation, massage, acupressure, TENS, heat, and cold application; Reviewed with patient prescribed pharmacological and nonpharmacological pain relief strategies; Advised patient to discuss questions and concerns related to plan of care with provider; Assessed social determinant of health barriers;  Advised patient to attend imaging studies scheduled on 10/28/21 Advised patient to schedule a follow up visit with PCP and discuss Pain Management referral  Patient Goals/Self-Care Activities: Take medications as prescribed   Attend all scheduled provider appointments Call pharmacy for medication refills 3-7 days in advance of running out of medications Call provider office for new concerns or questions  - develop a rescue plan - use an extra pillow to sleep - practice relaxation or meditation daily Work on cutting back on smoking

## 2021-10-28 ENCOUNTER — Encounter: Payer: Self-pay | Admitting: Nurse Practitioner

## 2021-10-28 ENCOUNTER — Ambulatory Visit
Admission: RE | Admit: 2021-10-28 | Discharge: 2021-10-28 | Disposition: A | Discharge status: Home / Self Care | Payer: Medicaid Other | Source: Ambulatory Visit | Attending: Neurosurgery | Admitting: Neurosurgery

## 2021-10-28 DIAGNOSIS — M545 Low back pain, unspecified: Secondary | ICD-10-CM

## 2021-10-28 DIAGNOSIS — G8929 Other chronic pain: Secondary | ICD-10-CM

## 2021-10-28 DIAGNOSIS — M4316 Spondylolisthesis, lumbar region: Secondary | ICD-10-CM | POA: Diagnosis not present

## 2021-10-29 ENCOUNTER — Encounter: Payer: Self-pay | Admitting: Nurse Practitioner

## 2021-11-01 ENCOUNTER — Ambulatory Visit (INDEPENDENT_AMBULATORY_CARE_PROVIDER_SITE_OTHER): Payer: Medicaid Other | Admitting: Nurse Practitioner

## 2021-11-01 ENCOUNTER — Other Ambulatory Visit: Payer: Self-pay

## 2021-11-01 ENCOUNTER — Encounter: Payer: Self-pay | Admitting: Nurse Practitioner

## 2021-11-01 VITALS — BP 105/69 | HR 71 | Temp 97.9°F | Wt 211.8 lb

## 2021-11-01 DIAGNOSIS — I700 Atherosclerosis of aorta: Secondary | ICD-10-CM | POA: Diagnosis not present

## 2021-11-01 DIAGNOSIS — M5440 Lumbago with sciatica, unspecified side: Secondary | ICD-10-CM

## 2021-11-01 DIAGNOSIS — G2581 Restless legs syndrome: Secondary | ICD-10-CM | POA: Diagnosis not present

## 2021-11-01 DIAGNOSIS — N393 Stress incontinence (female) (male): Secondary | ICD-10-CM

## 2021-11-01 DIAGNOSIS — J432 Centrilobular emphysema: Secondary | ICD-10-CM | POA: Diagnosis not present

## 2021-11-01 DIAGNOSIS — G8929 Other chronic pain: Secondary | ICD-10-CM | POA: Diagnosis not present

## 2021-11-01 HISTORY — DX: Stress incontinence (female) (male): N39.3

## 2021-11-01 NOTE — Assessment & Plan Note (Addendum)
Noted on past CT lung screening.  Recommend use of statin for cholesterol lowering an prevention, she refuses.  Recommend daily ASA, refused. 

## 2021-11-01 NOTE — Progress Notes (Signed)
BP 105/69    Pulse 71    Temp 97.9 F (36.6 C) (Oral)    Wt 211 lb 12.8 oz (96.1 kg)    SpO2 99%    BMI 33.67 kg/m    Subjective:    Patient ID: Kim [name], female    DOB: Jul 19, 1966, 56 y.o.   MRN: [id number]  HPI: Kim [name] is a 56 y.o. female  Chief Complaint  Patient presents with   Back Pain   Urinary Incontinence   BACK PAIN Patient presents to clinic to discuss her MRI results.  She went to see a neurosurgery and spine in [place], [name].    URINARY INCONTINENCE Patient states that coughing and sometimes walking causes her to have incontinence.  She states that she isn't able to control her bladder.  The supplies she receives helps to keep from making a mess.  Denies concerns.  The supplies help her.  Relevant past medical, surgical, family and social history reviewed and updated as indicated. Interim medical history since our last visit reviewed. Allergies and medications reviewed and updated.  Review of Systems  Genitourinary:  Positive for urgency.  Musculoskeletal:  Positive for back pain.   Per HPI unless specifically indicated above     Objective:    BP 105/69    Pulse 71    Temp 97.9 F (36.6 C) (Oral)    Wt 211 lb 12.8 oz (96.1 kg)    SpO2 99%    BMI 33.67 kg/m   Wt Readings from Last 3 Encounters:  11/01/21 211 lb 12.8 oz (96.1 kg)  07/04/21 200 lb (90.7 kg)  06/06/21 200 lb 9.6 oz (91 kg)    Physical Exam Vitals and nursing note reviewed.  Constitutional:      General: She is not in acute distress.    Appearance: Normal appearance. She is normal weight. She is not ill-appearing, toxic-appearing or diaphoretic.  HENT:     Head: Normocephalic.     Right Ear: External ear normal.     Left Ear: External ear normal.     Nose: Nose normal.     Mouth/Throat:     Mouth: Mucous membranes are moist.     Pharynx: Oropharynx is clear.  Eyes:     General:        Right eye: No discharge.        Left eye: No discharge.      Extraocular Movements: Extraocular movements intact.     Conjunctiva/sclera: Conjunctivae normal.     Pupils: Pupils are equal, round, and reactive to light.  Cardiovascular:     Rate and Rhythm: Normal rate and regular rhythm.     Heart sounds: No murmur heard. Pulmonary:     Effort: Pulmonary effort is normal. No respiratory distress.     Breath sounds: Normal breath sounds. No wheezing or rales.  Musculoskeletal:     Cervical back: Normal range of motion and neck supple.  Skin:    General: Skin is warm and dry.     Capillary Refill: Capillary refill takes less than 2 seconds.  Neurological:     General: No focal deficit present.     Mental Status: She is alert and oriented to person, place, and time. Mental status is at baseline.  Psychiatric:        Mood and Affect: Mood normal.        Behavior: Behavior normal.        Thought Content: Thought content  normal.        Judgment: Judgment normal.    Results for orders placed or performed in visit on 02/19/21  Comprehensive metabolic panel  Result Value Ref Range   Glucose 80 65 - 99 mg/dL   BUN 14 6 - 24 mg/dL   Creatinine, Ser 0.87 0.57 - 1.00 mg/dL   eGFR 79 >59 mL/min/1.73   BUN/Creatinine Ratio 16 9 - 23   Sodium 145 (H) 134 - 144 mmol/L   Potassium 5.2 3.5 - 5.2 mmol/L   Chloride 104 96 - 106 mmol/L   CO2 23 20 - 29 mmol/L   Calcium 9.8 8.7 - 10.2 mg/dL   Total Protein 7.2 6.0 - 8.5 g/dL   Albumin 4.3 3.8 - 4.9 g/dL   Globulin, Total 2.9 1.5 - 4.5 g/dL   Albumin/Globulin Ratio 1.5 1.2 - 2.2   Bilirubin Total 0.3 0.0 - 1.2 mg/dL   Alkaline Phosphatase 119 44 - 121 IU/L   AST 22 0 - 40 IU/L   ALT 21 0 - 32 IU/L  TSH  Result Value Ref Range   TSH 1.130 0.450 - 4.500 uIU/mL  Iron, TIBC and Ferritin Panel  Result Value Ref Range   Total Iron Binding Capacity 352 250 - 450 ug/dL   UIBC 273 131 - 425 ug/dL   Iron 79 27 - 159 ug/dL   Iron Saturation 22 15 - 55 %   Ferritin 59 15 - 150 ng/mL  CBC With  Differential/Platelet  Result Value Ref Range   WBC 7.6 3.4 - 10.8 x10E3/uL   RBC 4.43 3.77 - 5.28 x10E6/uL   Hemoglobin 14.8 11.1 - 15.9 g/dL   Hematocrit 42.6 34.0 - 46.6 %   MCV 96 79 - 97 fL   MCH 33.4 (H) 26.6 - 33.0 pg   MCHC 34.7 31.5 - 35.7 g/dL   RDW 15.5 (H) 11.7 - 15.4 %   Platelets 249 150 - 450 x10E3/uL   Neutrophils 73 Not Estab. %   Lymphs 20 Not Estab. %   MID 7 Not Estab. %   Neutrophils Absolute 5.6 1.4 - 7.0 x10E3/uL   Lymphocytes Absolute 1.5 0.7 - 3.1 x10E3/uL   MID (Absolute) 0.5 0.1 - 1.6 X10E3/uL      Assessment & Plan:   Problem List Items Addressed This Visit       Cardiovascular and Mediastinum   Aortic atherosclerosis ([place])    Noted on past CT lung screening.  Recommend use of statin for cholesterol lowering an prevention, she refuses.  Recommend daily ASA, refused.      Relevant Orders   Comp Met (CMET)   Lipid Profile     Respiratory   Centrilobular emphysema (HCC)    Chronic, uncontrolled.  Continue with Breztri inhaler.  Discussed use of albuterol PRN for SOB.  Follow up in 6 months for reevaluation.  Patient smokes about 4 packs of cigarettes daily.  Not ready to quit.      Relevant Orders   Comp Met (CMET)   Lipid Profile     Nervous and Auditory   Chronic bilateral low back pain with sciatica - Primary    Reviewed MRI results with patient during visit.  Disc bulge at L3-L4, traversing nerve root (goes across the disc) and stenosis at L3.  This is fairly unchanged since 2021. Another bulging disc L5-S1, nerve root impingement, arthritis again no much change since 2021.  There is edema noted in the L5-S1 area. Also 25% height change.  Relevant Orders   Ambulatory referral to Pain Clinic     Other   RLS (restless legs syndrome)    Chronic. Ongoing.  Continue with Requip 26m 5x daily.  Patient denies concerns at visit today. Follow up in 6 months for reevaluation.  Labs ordered today.       Stress incontinence    Chronic.   Needs incontinence supplies due to coughing, sneezing, or moving too quickly causing her to lose control of bladder.  Follow up in 6 months.  Call sooner if concerns arise.         Follow up plan: Return in about 6 months (around 05/01/2022) for HTN, HLD, DM2 FU.   A total of 30 minutes were spent on this encounter today.  When total time is documented, this includes both the face-to-face and non-face-to-face time personally spent before, during and after the visit on the date of the encounter reviewing results, ordering labs, and discussing medications.

## 2021-11-01 NOTE — Assessment & Plan Note (Signed)
Reviewed MRI results with patient during visit.  Disc bulge at L3-L4, traversing nerve root (goes across the disc) and stenosis at L3.  This is fairly unchanged since 2021. Another bulging disc L5-S1, nerve root impingement, arthritis again no much change since 2021.  There is edema noted in the L5-S1 area. Also 25% height change.

## 2021-11-01 NOTE — Assessment & Plan Note (Signed)
Chronic.  Needs incontinence supplies due to coughing, sneezing, or moving too quickly causing her to lose control of bladder.  Follow up in 6 months.  Call sooner if concerns arise.

## 2021-11-01 NOTE — Assessment & Plan Note (Signed)
Chronic. Ongoing.  Continue with Requip 4mg  5x daily.  Patient denies concerns at visit today. Follow up in 6 months for reevaluation.  Labs ordered today.

## 2021-11-01 NOTE — Assessment & Plan Note (Signed)
Chronic, uncontrolled.  Continue with Breztri inhaler.  Discussed use of albuterol PRN for SOB.  Follow up in 6 months for reevaluation.  Patient smokes about 4 packs of cigarettes daily.  Not ready to quit.

## 2021-11-02 LAB — COMPREHENSIVE METABOLIC PANEL
ALT: 23 IU/L (ref 0–32)
AST: 33 IU/L (ref 0–40)
Albumin/Globulin Ratio: 1.8 (ref 1.2–2.2)
Albumin: 4.8 g/dL (ref 3.8–4.9)
Alkaline Phosphatase: 135 IU/L — ABNORMAL HIGH (ref 44–121)
BUN/Creatinine Ratio: 14 (ref 9–23)
BUN: 11 mg/dL (ref 6–24)
Bilirubin Total: <0.2 mg/dL (ref 0.0–1.2)
CO2: 21 mmol/L (ref 20–29)
Calcium: 9.6 mg/dL (ref 8.7–10.2)
Chloride: 106 mmol/L (ref 96–106)
Creatinine, Ser: 0.8 mg/dL (ref 0.57–1.00)
Globulin, Total: 2.7 g/dL (ref 1.5–4.5)
Glucose: 98 mg/dL (ref 70–99)
Potassium: 4.4 mmol/L (ref 3.5–5.2)
Sodium: 143 mmol/L (ref 134–144)
Total Protein: 7.5 g/dL (ref 6.0–8.5)
eGFR: 87 mL/min/{1.73_m2} (ref >59)

## 2021-11-02 LAB — LIPID PANEL
Chol/HDL Ratio: 2.7 ratio (ref 0.0–4.4)
Cholesterol, Total: 178 mg/dL (ref 100–199)
HDL: 67 mg/dL (ref >39)
LDL Chol Calc (NIH): 95 mg/dL (ref 0–99)
Triglycerides: 85 mg/dL (ref 0–149)
VLDL Cholesterol Cal: 16 mg/dL (ref 5–40)

## 2021-11-03 ENCOUNTER — Encounter: Payer: Self-pay | Admitting: Nurse Practitioner

## 2021-11-04 NOTE — Progress Notes (Signed)
Hi Darlean.  Your lab work looks good.  Your Kidneys and electrolytes look good.  You liver enzymes are slightly elevated but consistent with prior.  We will continue to monitor this at future visits.  Your cholesterol is well controlled.  Although your cholesterol is controlled you would benefit from a statin since it was seen that you have the atherosclerosis.  This would be Crestor 5mg  daily.  We would increase it to 20mg  if you are able to tolerate the medication.  If you agree, I can send it to the pharmacy for you.  Having the atherosclerosis increases your risk of a stroke or heart attack.  The Statin helps reduce the risk.  If you have any questions, please let me know.

## 2021-11-06 ENCOUNTER — Encounter: Payer: Self-pay | Admitting: Nurse Practitioner

## 2021-11-06 MED ORDER — ROSUVASTATIN CALCIUM 5 MG PO TABS: 5.0000 mg | ORAL_TABLET | Freq: Every day | ORAL | 1 refills | Status: DC

## 2021-11-07 ENCOUNTER — Encounter: Payer: Self-pay | Admitting: Nurse Practitioner

## 2021-11-13 DIAGNOSIS — S22000A Wedge compression fracture of unspecified thoracic vertebra, initial encounter for closed fracture: Secondary | ICD-10-CM | POA: Diagnosis not present

## 2021-11-13 DIAGNOSIS — R32 Unspecified urinary incontinence: Secondary | ICD-10-CM | POA: Diagnosis not present

## 2021-11-14 ENCOUNTER — Other Ambulatory Visit: Payer: Self-pay

## 2021-11-14 ENCOUNTER — Other Ambulatory Visit: Payer: Self-pay | Admitting: *Deleted

## 2021-11-14 NOTE — Patient Instructions (Signed)
Visit Information ? ?Kim [name] was given information about Medicaid Managed Care team care coordination services as a part of their [facility] Community Plan Medicaid benefit. Kim [name] verbally consented to engagement with the [facility] Managed Care team.  ? ?If you are experiencing a medical emergency, please call 911 or report to your local emergency department or urgent care.  ? ?If you have a non-emergency medical problem during routine business hours, please contact your provider's office and ask to speak with a nurse.  ? ?For questions related to your [facility], please call: [phone] or visit the homepage here: [email or web address] ? ?If you would like to schedule transportation through your [facility], please call the following number at least 2 days in advance of your appointment: [phone]. ? Rides for urgent appointments can also be made after hours by calling Member Services. ? ?Call the Behavioral Health Crisis Line at [phone], at any time, 24 hours a day, 7 days a week. If you are in danger or need immediate medical attention call 911. ? ?If you would like help to quit smoking, call 1-800-QUIT-NOW (601-277-0685) OR Espa?ol: 1-855-D?jelo-Ya 940-472-2477) o para m?s informaci?n haga clic aqu? or Text READY to 200-400 to register via text ? ?Kim [name], ? ? ?Please see education materials related to managing back pain and atherosclerosis provided by MyChart link. ? ?Patient verbalizes understanding of instructions and care plan provided today and agrees to view in MyChart. Active MyChart status confirmed with patient.   ? ?Telephone follow up appointment with Managed Medicaid care management team member scheduled for:12/05/21 @ 10:30am ? ?[name] RN, BSN ?[place]  [facility] ?RN Care Coordinator ? ? ?Following is a copy of your plan of care:   ?Care Plan : RN Care Manager Plan of Care  ?Updates made by Heidi Dach, RN since 11/14/2021 12:00 AM  ?  ? ?Problem: Chronic health management needs related to pain and COPD   ?  ? ?Long-Range Goal: Development of Plan of Care to address health management needs related to pain and COPD   ?Start Date: 08/22/2021  ?Expected End Date: 12/13/2021  ?Priority: High  ?Note:   ?Current Barriers:  ?Chronic Disease Management support and education needs related to COPD and pain ?Kim [name] continues to have daily headache and back pain. She is scheduled with Dr. Lovell Sheehan to review MRI results on 11/15/21 and Pain Management on 12/23/21. She has been visiting a friend in [place], [place] and may possibly be moving there. This is a less stressful environment and physically less intense, which seems to be helping with her back pain. Kim [name] declines follow up with Pulmonology until she has back pain managed ? ?RNCM Clinical Goal(s):  ?Patient will verbalize understanding of plan for management of COPD and pain as evidenced by patient verbalization and self monitoring activities ?take all medications exactly as prescribed and will call provider for medication related questions as evidenced by documentation in EMR    ?attend all scheduled medical appointments: 11/15/21 with Dr. Lovell Sheehan and 12/23/21 with Pain Management as evidenced by provider documentation in EMR        through collaboration with RN Care manager, provider, and care team.  ? ?Interventions: ?Inter-disciplinary care team collaboration (see longitudinal plan of care) ?Evaluation of current treatment plan related to  self management and patient's adherence to plan as established by provider  ?Provided therapeutic listening ?Congratulated patient on cutting back on drinking beer, maintaining  at 3-4 beers/day, was drinking 18-24 beers/day ?RNCM will follow up with patient her visit with Dr. Lovell Sheehan to review plan of care ? ? ?COPD: (Status: Goal on track: NO.) Long Term Goal   ?Reviewed medications with patient, including use of prescribed maintenance and rescue inhalers, and provided instruction on medication management and the importance of adherence ?Advised patient to self assesses COPD action plan zone and make appointment with provider if in the yellow zone for 48 hours without improvement ?Discussed the importance of adequate rest and management of fatigue with COPD ?Assessed social determinant of health barriers ?Discussed the importance of follow up with Pulmonology once she has a back pain managed ? ?Pain:  (Status: Goal on Track (progressing): YES.) Long Term Goal  ?Pain assessment performed ?Medications reviewed ?Reviewed provider established plan for pain management; ?Discussed importance of adherence to all scheduled medical appointments; ?Counseled on the importance of reporting any/all new or changed pain symptoms or management strategies to pain management provider; ?Advised patient to report to care team affect of pain on daily activities; ?Discussed use of relaxation techniques and/or diversional activities to assist with pain reduction (distraction, imagery, relaxation, massage, acupressure, TENS, heat, and cold application; ?Reviewed with patient prescribed pharmacological and nonpharmacological pain relief strategies; ?Advised patient to discuss questions and concerns related to plan of care with provider; ?Assessed social determinant of health barriers;  ?Advised patient to attend scheduled appointments with Dr. Lovell Sheehan and Pain Management ? ? ?Patient Goals/Self-Care Activities: ?Take medications as prescribed   ?Attend all scheduled provider appointments ?Call pharmacy for medication refills 3-7 days in advance of running out of medications ?Call provider office for new concerns or questions  ?- develop a rescue plan ?- use an extra pillow to sleep ?- practice relaxation or meditation daily ?Work on cutting back on smoking ? ? ?  ?  ?

## 2021-11-14 NOTE — Patient Outreach (Signed)
?Medicaid Managed Care   ?Nurse Care Manager Note ? ?11/14/2021 ?Name:  Kim [name] MRN:  [id number] DOB:  July 19, 1966 ? ?Kim [name] is an 56 y.o. year old female who is a primary patient of Kim Billings, NP.  The [facility] Managed Care Coordination team was consulted for assistance with:    ?COPD ?pain ? ?Kim [name] was given information about Medicaid Managed Care Coordination team services today. Kim [name] Patient agreed to services and verbal consent obtained. ? ?Engaged with patient by telephone for follow up visit in response to provider referral for case management and/or care coordination services.  ? ?Assessments/Interventions:  Review of past medical history, allergies, medications, health status, including review of consultants reports, laboratory and other test data, was performed as part of comprehensive evaluation and provision of chronic care management services. ? ?SDOH (Social Determinants of Health) assessments and interventions performed: ?SDOH Interventions   ? ?Flowsheet Row Most Recent Value  ?SDOH Interventions   ?Food Insecurity Interventions Intervention Not Indicated  ?Transportation Interventions Intervention Not Indicated  ? ?  ? ? ?Care Plan ? ?Allergies  ?Allergen Reactions  ? Penicillins Other (See Comments)  ?  Unknown childhood reaction  ? Sulfa Antibiotics Swelling  ? ? ?Medications Reviewed Today   ? ? Reviewed by Kim Montane, RN (Registered Nurse) on 11/14/21 at 21  Med List Status: <None>  ? ?Medication Order Taking? Sig Documenting Provider Last Dose Status Informant  ?Aspirin-Salicylamide-Caffeine (BC HEADACHE POWDER PO) [id number] Yes Take by mouth. [provider] Taking Active   ?         ?Med Note ([name] A   Thu Nov 14, 2021 10:46 AM) Taking 25 a week  ?fluticasone (FLONASE) 50 MCG/ACT nasal spray [id number] Yes shake liquid AND INSTILL 1 SPRAY IN EACH NOSTRIL DAILY Kim Billings, NP Taking Active   ?ondansetron  (ZOFRAN-ODT) 8 MG disintegrating tablet [id number] Yes TAKE ONE TABLET BY MOUTH EVERY 8 HOURS AS NEEDED FOR NAUSEA AND VOMITING Kim Billings, NP Taking Active   ?ramelteon (ROZEREM) 8 MG tablet [id number] Yes Take 8 mg by mouth at bedtime. [provider] Taking Active   ?rOPINIRole (REQUIP) 4 MG tablet [id number] Yes TAKE ONE TABLET BY MOUTH FIVE TIMES DAILY Kim Billings, NP Taking Active   ?rosuvastatin (CRESTOR) 5 MG tablet [id number] Yes Take 1 tablet (5 mg total) by mouth daily. Kim Billings, NP Taking Active   ?SUMAtriptan 6 MG/0.5ML SOAJ [id number] Yes INJECT 0.5 ML INTO THE SKIN AT ONSET OF HEADACHE. MAY REPEAT AFTER 1 HOUR IF NO BETTER. MAXIMUM OF 2 DOSES IN [place], [name], NP Taking Active   ? ?  ?  ? ?  ? ? ?Patient Active Problem List  ? Diagnosis Date Noted  ? Stress incontinence 11/01/2021  ? Chronic bilateral low back pain with sciatica 07/04/2021  ? Upper airway cough syndrome 05/01/2021  ? Cigarette smoker 05/01/2021  ? Multiple pulmonary nodules determined by computed tomography of lung 05/01/2021  ? Rectal bleeding 02/19/2021  ? History of ileus 02/19/2021  ? Aortic atherosclerosis ([place]) 01/14/2021  ? Headache disorder 09/20/2020  ? Rebound headache 09/20/2020  ? Lumbar radiculopathy 08/01/2020  ? Cervical radiculopathy 08/01/2020  ? Compression fracture of T12 vertebra ([place]) 08/01/2020  ? PTSD (post-traumatic stress disorder) 04/05/2020  ? GERD (gastroesophageal reflux disease)   ? Centrilobular emphysema (Kim [name]) 02/21/2016  ? RLS (restless legs syndrome) 07/02/2015  ? Migraines 07/02/2015  ? COPD GOLD ? / active smoker 06/25/2011  ? Nicotine  dependence, cigarettes, w unsp disorders 06/25/2011  ? ? ?Conditions to be addressed/monitored per PCP order:  COPD and pain ? ?Care Plan : RN Care Manager Plan of Care  ?Updates made by Kim Montane, RN since 11/14/2021 12:00 AM  ?  ? ?Problem: Chronic health management needs related to pain and COPD   ?  ? ?Long-Range Goal:  Development of Plan of Care to address health management needs related to pain and COPD   ?Start Date: 08/22/2021  ?Expected End Date: 12/13/2021  ?Priority: High  ?Note:   ?Current Barriers:  ?Chronic Disease Management support and education needs related to COPD and pain ?[name] continues to have daily headache and back pain. She is scheduled with [name] to review MRI results on 11/15/21 and Pain Management on 12/23/21. She has been visiting a friend in [place], [place] and may possibly be moving there. This is a less stressful environment and physically less intense, which seems to be helping with her back pain. [name] declines follow up with Pulmonology until she has back pain managed ? ?RNCM Clinical Goal(s):  ?Patient will verbalize understanding of plan for management of COPD and pain as evidenced by patient verbalization and self monitoring activities ?take all medications exactly as prescribed and will call provider for medication related questions as evidenced by documentation in EMR    ?attend all scheduled medical appointments: 11/15/21 with [name] and 12/23/21 with Pain Management as evidenced by provider documentation in EMR        through collaboration with RN Care manager, provider, and care team.  ? ?Interventions: ?Inter-disciplinary care team collaboration (see longitudinal plan of care) ?Evaluation of current treatment plan related to  self management and patient's adherence to plan as established by provider  ?Provided therapeutic listening ?Congratulated patient on cutting back on drinking beer, maintaining at 3-4 beers/day, was drinking 18-24 beers/day ?RNCM will follow up with patient her visit with [name] to review plan of care ? ? ?COPD: (Status: Goal on track: NO.) Long Term Goal  ?Reviewed medications with patient, including use of prescribed maintenance and rescue inhalers, and provided instruction on medication management and the importance of adherence ?Advised patient to self  assesses COPD action plan zone and make appointment with provider if in the yellow zone for 48 hours without improvement ?Discussed the importance of adequate rest and management of fatigue with COPD ?Assessed social determinant of health barriers ?Discussed the importance of follow up with Pulmonology once she has a back pain managed ? ?Pain:  (Status: Goal on Track (progressing): YES.) Long Term Goal  ?Pain assessment performed ?Medications reviewed ?Reviewed provider established plan for pain management; ?Discussed importance of adherence to all scheduled medical appointments; ?Counseled on the importance of reporting any/all new or changed pain symptoms or management strategies to pain management provider; ?Advised patient to report to care team affect of pain on daily activities; ?Discussed use of relaxation techniques and/or diversional activities to assist with pain reduction (distraction, imagery, relaxation, massage, acupressure, TENS, heat, and cold application; ?Reviewed with patient prescribed pharmacological and nonpharmacological pain relief strategies; ?Advised patient to discuss questions and concerns related to plan of care with provider; ?Assessed social determinant of health barriers;  ?Advised patient to attend scheduled appointments with [name] and Pain Management ? ? ?Patient Goals/Self-Care Activities: ?Take medications as prescribed   ?Attend all scheduled provider appointments ?Call pharmacy for medication refills 3-7 days in advance of running out of medications ?Call provider office for new concerns  or questions  ?- develop a rescue plan ?- use an extra pillow to sleep ?- practice relaxation or meditation daily ?Work on cutting back on smoking ? ? ?  ? ? ?Follow Up:  Patient agrees to Care Plan and Follow-up. ? ?Plan: The Managed Medicaid care management team will reach out to the patient again over the next 30 days. ? ?Date/time of next scheduled RN care management/care coordination  outreach:  12/05/21 @ 10:30am ? ?Lurena Joiner RN, BSN ?[place] ?RN Care Coordinator ? ?

## 2021-11-15 ENCOUNTER — Encounter: Payer: Self-pay | Admitting: Nurse Practitioner

## 2021-11-15 ENCOUNTER — Other Ambulatory Visit: Payer: Self-pay | Admitting: Nurse Practitioner

## 2021-11-15 DIAGNOSIS — R0781 Pleurodynia: Secondary | ICD-10-CM

## 2021-11-15 DIAGNOSIS — M545 Low back pain, unspecified: Secondary | ICD-10-CM | POA: Diagnosis not present

## 2021-11-15 DIAGNOSIS — M5136 Other intervertebral disc degeneration, lumbar region: Secondary | ICD-10-CM | POA: Diagnosis not present

## 2021-11-15 DIAGNOSIS — M5137 Other intervertebral disc degeneration, lumbosacral region: Secondary | ICD-10-CM | POA: Diagnosis not present

## 2021-11-15 MED ORDER — ROPINIROLE HCL 4 MG PO TABS: ORAL_TABLET | ORAL | 1 refills | Status: DC

## 2021-11-15 NOTE — Telephone Encounter (Signed)
Requested Prescriptions  ?Pending Prescriptions Disp Refills  ?? rOPINIRole (REQUIP) 4 MG tablet [Pharmacy Med Name: ROPINIROLE 4MG  TABLETS] 450 tablet   ?  Sig: TAKE 1 TABLET BY MOUTH FIVE TIMES DAILY  ?  ? Neurology:  Parkinsonian Agents Passed - 11/15/2021  9:31 AM  ?  ?  Passed - Last BP in normal range  ?  BP Readings from Last 1 Encounters:  ?11/01/21 105/69  ?   ?  ?  Passed - Last Heart Rate in normal range  ?  Pulse Readings from Last 1 Encounters:  ?11/01/21 71  ?   ?  ?  Passed - Valid encounter within last 12 months  ?  Recent Outpatient Visits   ?      ? 2 weeks ago Chronic bilateral low back pain with sciatica, sciatica laterality unspecified  ? [facility], NP  ? 4 months ago Chronic bilateral low back pain with sciatica, sciatica laterality unspecified  ? [facility], NP  ? 5 months ago Cough  ? [facility], NP  ? 7 months ago Centrilobular emphysema (HCC)  ? [facility], NP  ? 8 months ago Pulmonary emphysema, unspecified emphysema type (HCC)  ? [facility], NP  ?  ?  ?Future Appointments   ?        ? In 5 months Larae Grooms, NP [facility]  ?  ? ?  ?  ?  ? ? ?

## 2021-11-16 DIAGNOSIS — M549 Dorsalgia, unspecified: Secondary | ICD-10-CM | POA: Diagnosis not present

## 2021-11-16 DIAGNOSIS — R053 Chronic cough: Secondary | ICD-10-CM | POA: Diagnosis not present

## 2021-11-17 ENCOUNTER — Other Ambulatory Visit: Payer: Self-pay | Admitting: Nurse Practitioner

## 2021-11-18 NOTE — Telephone Encounter (Signed)
Requested medication (s) are due for refill today: yes ? ?Requested medication (s) are on the active medication list: yes ? ?Last refill:  07/04/21 #90 with 2 RF ? ?Future visit scheduled: 05/01/22 ? ?Notes to clinic: This medication can not be delegated, please assess.  ? ? ? ?  ? ?Requested Prescriptions  ?Pending Prescriptions Disp Refills  ? ondansetron (ZOFRAN-ODT) 8 MG disintegrating tablet [Pharmacy Med Name: ONDANSETRON ODT 8MG  TABLETS] 90 tablet 2  ?  Sig: DISSOLVE 1 TABLET BY MOUTH EVERY 8 HOURS AS NEEDED FOR NAUSEA OR VOMITING  ?  ? Not Delegated - Gastroenterology: Antiemetics - ondansetron Failed - 11/17/2021  3:21 AM  ?  ?  Failed - This refill cannot be delegated  ?  ?  Passed - AST in normal range and within 360 days  ?  AST  ?Date Value Ref Range Status  ?11/01/2021 33 0 - 40 IU/L Final  ? ?SGOT(AST)  ?Date Value Ref Range Status  ?11/25/2013 26 15 - 37 Unit/L Final  ?  ?  ?  ?  Passed - ALT in normal range and within 360 days  ?  ALT  ?Date Value Ref Range Status  ?11/01/2021 23 0 - 32 IU/L Final  ? ?SGPT (ALT)  ?Date Value Ref Range Status  ?11/25/2013 19 12 - 78 U/L Final  ?  ?  ?  ?  Passed - Valid encounter within last 6 months  ?  Recent Outpatient Visits   ? ?      ? 2 weeks ago Chronic bilateral low back pain with sciatica, sciatica laterality unspecified  ? [facility], NP  ? 4 months ago Chronic bilateral low back pain with sciatica, sciatica laterality unspecified  ? [facility], NP  ? 5 months ago Cough  ? [facility], NP  ? 7 months ago Centrilobular emphysema (HCC)  ? [facility], NP  ? 8 months ago Pulmonary emphysema, unspecified emphysema type (HCC)  ? [facility], NP  ? ?  ?  ?Future Appointments   ? ?        ? In 5 months Larae Grooms, NP [facility]  ? ?  ? ?  ?  ?  ? ? ?

## 2021-12-01 ENCOUNTER — Encounter: Payer: Self-pay | Admitting: Nurse Practitioner

## 2021-12-05 ENCOUNTER — Other Ambulatory Visit: Payer: Self-pay

## 2021-12-05 ENCOUNTER — Other Ambulatory Visit: Payer: Self-pay | Admitting: *Deleted

## 2021-12-05 NOTE — Patient Instructions (Signed)
Visit Information ? ?Ms. Branham was given information about Medicaid Managed Care team care coordination services as a part of their [place] Medicaid benefit. Gershon Crane Govoni verbally consented to engagement with the [facility] Managed Care team.  ? ?If you are experiencing a medical emergency, please call 911 or report to your local emergency department or urgent care.  ? ?If you have a non-emergency medical problem during routine business hours, please contact your provider's office and ask to speak with a nurse.  ? ?For questions related to your [facility], please call: [phone] or visit the homepage here: [email or web address] ? ?If you would like to schedule transportation through your [facility], please call the following number at least 2 days in advance of your appointment: [phone]. ? Rides for urgent appointments can also be made after hours by calling Member Services. ? ?Call the [place] at [phone], at any time, 24 hours a day, 7 days a week. If you are in danger or need immediate medical attention call 911. ? ?If you would like help to quit smoking, call 1-800-QUIT-NOW 806-487-9012) OR Espa?ol: 1-855-D?jelo-Ya 504-571-6187) o para m?s informaci?n haga clic aqu? or Text READY to 200-400 to register via text ? ?Ms. Leavy, ? ? ?Please see education materials related to managing back pain and smoking provided by MyChart link. ? ?Patient verbalizes understanding of instructions and care plan provided today and agrees to view in [place]. Active MyChart status confirmed with patient.   ? ?Telephone follow up appointment with Managed Medicaid care management team member scheduled for:01/06/22 @ 10:30am ? ?Lurena Joiner RN, BSN ?[place] ?RN Care Coordinator ? ? ?Following is a copy of your plan of care:  ?Care Plan  : [place] of Care  ?Updates made by Melissa Montane, RN since 12/05/2021 12:00 AM  ?  ? ?Problem: Chronic health management needs related to pain and COPD   ?  ? ?Long-Range Goal: Development of Plan of Care to address health management needs related to pain and COPD   ?Start Date: 08/22/2021  ?Expected End Date: 01/10/2022  ?Priority: High  ?Note:   ?Current Barriers:  ?Chronic Disease Management support and education needs related to COPD and pain ?Ms. Kertesz continues to have daily headache and back pain. She is scheduled with [name] to review MRI results on 11/15/21 and Pain Management on 12/23/21. [name] is unable to surgically help Ms. Kuper. She is visiting in [place] for a few weeks, which is a less stressful environment and physically less intense. This seems to be helping with her back pain and she feels better than she has in a while. Ms. Trevino was recently treated for pneumonia, now her rib and upper back pain is resolved. She plans to follow up with Pulmonology when she returns home to [facility]. ? ?RNCM Clinical Goal(s):  ?Patient will verbalize understanding of plan for management of COPD and pain as evidenced by patient verbalization and self monitoring activities ?take all medications exactly as prescribed and will call provider for medication related questions as evidenced by documentation in EMR    ?attend all scheduled medical appointments:  12/23/21 with Pain Management as evidenced by provider documentation in EMR        through collaboration with RN Care manager, provider, and care team.  ? ?Interventions: ?Inter-disciplinary care team collaboration (see longitudinal plan of care) ?Evaluation of current treatment plan related  to  self management and patient's adherence to plan as established by provider  ?Provided therapeutic listening ?Congratulated patient on cutting back on drinking beer, maintaining at 3-4 beers/day, was drinking 18-24 beers/day ? ? ?COPD: (Status: Goal on  track: NO.) Long Term Goal  ?Reviewed medications with patient, including use of prescribed maintenance and rescue inhalers, and provided instruction on medication management and the importance of adherence ?Advised patient to self assesses COPD action plan zone and make appointment with provider if in the yellow zone for 48 hours without improvement ?Discussed the importance of adequate rest and management of fatigue with COPD ?Assessed social determinant of health barriers ?Discussed the importance of follow up with Pulmonology once she has a back pain managed ? ?Pain:  (Status: Goal on Track (progressing): YES.) Long Term Goal  ?Pain assessment performed ?Medications reviewed ?Reviewed provider established plan for pain management; ?Discussed importance of adherence to all scheduled medical appointments; ?Counseled on the importance of reporting any/all new or changed pain symptoms or management strategies to pain management provider; ?Advised patient to report to care team affect of pain on daily activities; ?Discussed use of relaxation techniques and/or diversional activities to assist with pain reduction (distraction, imagery, relaxation, massage, acupressure, TENS, heat, and cold application; ?Reviewed with patient prescribed pharmacological and nonpharmacological pain relief strategies; ?Advised patient to discuss questions and concerns related to plan of care with provider; ?Assessed social determinant of health barriers;  ? ? ? ?Patient Goals/Self-Care Activities: ?Take medications as prescribed   ?Attend all scheduled provider appointments ?Call pharmacy for medication refills 3-7 days in advance of running out of medications ?Call provider office for new concerns or questions  ?- develop a rescue plan ?- use an extra pillow to sleep ?- practice relaxation or meditation daily ?Work on cutting back on smoking ? ? ?  ?  ?

## 2021-12-05 NOTE — Patient Outreach (Signed)
?Medicaid Managed Care   ?Nurse Care Manager Note ? ?12/05/2021 ?Name:  Kim FretMary Darlean [name] MRN:  [id number] DOB:  [birth year] ? ?Kim [name] is an 56 y.o. year old female who is [name] primary patient of Kim GroomsHoldsworth, [name], NP.  [facility] for assistance with:    ?COPD ?Chronic pain ? ?[name] was given information about Medicaid Managed Care Coordination team services today. Kim [name] Patient agreed to services and verbal consent obtained. ? ?Engaged with patient by telephone for follow up visit in response to provider referral for case management and/or care coordination services.  ? ?Assessments/Interventions:  Review of past medical history, allergies, medications, health status, including review of consultants reports, laboratory and other test data, was performed as part of comprehensive evaluation and provision of chronic care management services. ? ?SDOH (Social Determinants of Health) assessments and interventions performed: ?SDOH Interventions   ? ?Flowsheet Row Most Recent Value  ?SDOH Interventions   ?Housing Interventions Patient Refused  ?Physical Activity Interventions Intervention Not Indicated  ? ?  ? ? ?Care Plan ? ?Allergies  ?Allergen Reactions  ? Penicillins Other (See Comments)  ?  Unknown childhood reaction  ? Sulfa Antibiotics Swelling  ? ? ?Medications Reviewed Today   ? ? Reviewed by Kim [name], Kim Lunde A, RN (Registered Nurse) on 12/05/21 at 1042  Med List Status: <None>  ? ?Medication Order Taking? Sig Documenting Provider Last Dose Status Informant  ?Aspirin-Salicylamide-Caffeine (BC HEADACHE POWDER PO) [id number] Yes Take by mouth. [provider] Taking Active   ?         ?Med Note (Owynn Mosqueda [name]   Thu Nov 14, 2021 10:46 AM) Taking 25 [name] week  ?fluticasone (FLONASE) 50 MCG/ACT nasal spray [id number] Yes shake liquid AND INSTILL 1 SPRAY IN EACH NOSTRIL DAILY Kim GroomsHoldsworth, [name], NP Taking Active   ?ondansetron (ZOFRAN-ODT) 8 MG  disintegrating tablet [id number] Yes DISSOLVE 1 TABLET BY MOUTH EVERY 8 HOURS AS NEEDED FOR NAUSEA OR VOMITING Kim GroomsHoldsworth, [name], NP Taking Active   ?ramelteon (ROZEREM) 8 MG tablet [id number] Yes Take 8 mg by mouth at bedtime. [provider] Taking Active   ?rOPINIRole (REQUIP) 4 MG tablet [id number] Yes TAKE 1 TABLET BY MOUTH FIVE TIMES DAILY Kim GroomsHoldsworth, [name], NP Taking Active   ?rosuvastatin (CRESTOR) 5 MG tablet [id number] Yes Take 1 tablet (5 mg total) by mouth daily. Kim GroomsHoldsworth, [name], NP Taking Active   ?SUMAtriptan 6 MG/0.5ML SOAJ [id number] Yes INJECT 0.5 ML INTO THE SKIN AT ONSET OF HEADACHE. MAY REPEAT AFTER 1 HOUR IF NO BETTER. MAXIMUM OF 2 DOSES IN 24 HOURS Kim GroomsHoldsworth, [name], NP Taking Active   ? ?  ?  ? ?  ? ? ?Patient Active Problem List  ? Diagnosis Date Noted  ? Stress incontinence 11/01/2021  ? Chronic bilateral low back pain with sciatica 07/04/2021  ? Upper airway cough syndrome 05/01/2021  ? Cigarette smoker 05/01/2021  ? Multiple pulmonary nodules determined by computed tomography of lung 05/01/2021  ? Rectal bleeding 02/19/2021  ? History of ileus 02/19/2021  ? Aortic atherosclerosis (HCC) 01/14/2021  ? Headache disorder 09/20/2020  ? Rebound headache 09/20/2020  ? Lumbar radiculopathy 08/01/2020  ? Cervical radiculopathy 08/01/2020  ? Compression fracture of T12 vertebra (HCC) 08/01/2020  ? PTSD (post-traumatic stress disorder) 04/05/2020  ? GERD (gastroesophageal reflux disease)   ? Centrilobular emphysema (HCC) 02/21/2016  ? RLS (restless legs syndrome) 07/02/2015  ? Migraines 07/02/2015  ? COPD GOLD ? / active smoker 06/25/2011  ? Nicotine  dependence, cigarettes, w unsp disorders 06/25/2011  ? ? ?Conditions to be addressed/monitored per PCP order:  COPD and chronic pain ? ?Care Plan : RN Care Manager Plan of Care  ?Updates made by Kim Dach, RN since 12/05/2021 12:00 AM  ?  ? ?Problem: Chronic health management needs related to pain and COPD   ?  ? ?Long-Range Goal: Development  of Plan of Care to address health management needs related to pain and COPD   ?Start Date: 08/22/2021  ?Expected End Date: 01/10/2022  ?Priority: High  ?Note:   ?Current Barriers:  ?Chronic Disease Management support and education needs related to COPD and pain ?[name] continues to have daily headache and back pain. She is scheduled with Dr. Lovell Sheehan to review MRI results on 11/15/21 and Pain Management on 12/23/21. Dr. Lovell Sheehan is unable to surgically help [name]. She is visiting in [place] for [name] few weeks, which is [name] less stressful environment and physically less intense. This seems to be helping with her back pain and she feels better than she has in [name] while. [name] was recently treated for pneumonia, now her rib and upper back pain is resolved. She plans to follow up with Pulmonology when she returns home to [facility] ? ?RNCM Clinical Goal(s):  ?Patient will verbalize understanding of plan for management of COPD and pain as evidenced by patient verbalization and self monitoring activities ?take all medications exactly as prescribed and will call provider for medication related questions as evidenced by documentation in EMR    ?attend all scheduled medical appointments:  12/23/21 with Pain Management as evidenced by provider documentation in EMR        through collaboration with RN Care manager, provider, and care team.  ? ?Interventions: ?Inter-disciplinary care team collaboration (see longitudinal plan of care) ?Evaluation of current treatment plan related to  self management and patient's adherence to plan as established by provider  ?Provided therapeutic listening ?Congratulated patient on cutting back on drinking beer, maintaining at 3-4 beers/day, was drinking 18-24 beers/day ? ? ?COPD: (Status: Goal on track: NO.) Long Term Goal  ?Reviewed medications with patient, including use of prescribed maintenance and rescue inhalers, and provided instruction on medication management and the importance of  adherence ?Advised patient to self assesses COPD action plan zone and make appointment with provider if in the yellow zone for 48 hours without improvement ?Discussed the importance of adequate rest and management of fatigue with COPD ?Assessed social determinant of health barriers ?Discussed the importance of follow up with Pulmonology once she has [name] back pain managed ? ?Pain:  (Status: Goal on Track (progressing): YES.) Long Term Goal  ?Pain assessment performed ?Medications reviewed ?Reviewed provider established plan for pain management; ?Discussed importance of adherence to all scheduled medical appointments; ?Counseled on the importance of reporting any/all new or changed pain symptoms or management strategies to pain management provider; ?Advised patient to report to care team affect of pain on daily activities; ?Discussed use of relaxation techniques and/or diversional activities to assist with pain reduction (distraction, imagery, relaxation, massage, acupressure, TENS, heat, and cold application; ?Reviewed with patient prescribed pharmacological and nonpharmacological pain relief strategies; ?Advised patient to discuss questions and concerns related to plan of care with provider; ?Assessed social determinant of health barriers;  ? ? ? ?Patient Goals/Self-Care Activities: ?Take medications as prescribed   ?Attend all scheduled provider appointments ?Call pharmacy for medication refills 3-7 days in advance of running out of medications ?Call provider office for new concerns  or questions  ?- develop [name] rescue plan ?- use an extra pillow to sleep ?- practice relaxation or meditation daily ?Work on cutting back on smoking ? ? ?  ? ? ?Follow Up:  Patient agrees to Care Plan and Follow-up. ? ?Plan: The Managed Medicaid care management team will reach out to the patient again over the next 30 days. ? ?Date/time of next scheduled RN care management/care coordination outreach:  01/06/22 @ 10:30am ? ?[name] RN,  BSN ?[facility] ?RN Care Coordinator ? ?

## 2021-12-12 ENCOUNTER — Other Ambulatory Visit: Payer: Self-pay | Admitting: Nurse Practitioner

## 2021-12-13 ENCOUNTER — Other Ambulatory Visit: Payer: Self-pay | Admitting: Nurse Practitioner

## 2021-12-13 NOTE — Telephone Encounter (Signed)
Duplicate request for 90 day supply- denied by office today ?Requested Prescriptions  ?Pending Prescriptions Disp Refills  ?? rOPINIRole (REQUIP) 4 MG tablet [Pharmacy Med Name: ROPINIROLE 4MG  TABLETS] 450 tablet 1  ?  Sig: TAKE 1 TABLET BY MOUTH FIVE TIMES DAILY  ?  ? Neurology:  Parkinsonian Agents Passed - 12/13/2021 11:15 AM  ?  ?  Passed - Last BP in normal range  ?  BP Readings from Last 1 Encounters:  ?11/01/21 105/69  ?   ?  ?  Passed - Last Heart Rate in normal range  ?  Pulse Readings from Last 1 Encounters:  ?11/01/21 71  ?   ?  ?  Passed - Valid encounter within last 12 months  ?  Recent Outpatient Visits   ?      ? 1 month ago Chronic bilateral low back pain with sciatica, sciatica laterality unspecified  ? [facility], NP  ? 5 months ago Chronic bilateral low back pain with sciatica, sciatica laterality unspecified  ? [facility], NP  ? 6 months ago Cough  ? [facility], NP  ? 8 months ago Centrilobular emphysema (HCC)  ? [facility], NP  ? 9 months ago Pulmonary emphysema, unspecified emphysema type (HCC)  ? [facility], NP  ?  ?  ?Future Appointments   ?        ? In 4 months Larae Grooms, NP [facility]  ?  ? ?  ?  ?  ? ?

## 2021-12-13 NOTE — Telephone Encounter (Signed)
Requested medication (s) are due for refill today - no ? ?Requested medication (s) are on the active medication list -yes ? ?Future visit scheduled -yes ? ?Last refill: 11/15/21 #450 1 RF ? ?Notes to clinic: [place]- sent for review of request- not sure if ok to forward out of state ? ?Requested Prescriptions  ?Pending Prescriptions Disp Refills  ? rOPINIRole (REQUIP) 4 MG tablet [Pharmacy Med Name: ROPINIROLE 4MG  TABLETS] 450 tablet 1  ?  Sig: TAKE 1 TABLET BY MOUTH FIVE TIMES DAILY  ?  ? Neurology:  Parkinsonian Agents Passed - 12/12/2021  1:03 PM  ?  ?  Passed - Last BP in normal range  ?  BP Readings from Last 1 Encounters:  ?11/01/21 105/69  ?  ?  ?  ?  Passed - Last Heart Rate in normal range  ?  Pulse Readings from Last 1 Encounters:  ?11/01/21 71  ?  ?  ?  ?  Passed - Valid encounter within last 12 months  ?  Recent Outpatient Visits   ? ?      ? 1 month ago Chronic bilateral low back pain with sciatica, sciatica laterality unspecified  ? [facility], NP  ? 5 months ago Chronic bilateral low back pain with sciatica, sciatica laterality unspecified  ? [facility], NP  ? 6 months ago Cough  ? [facility], NP  ? 8 months ago Centrilobular emphysema (HCC)  ? [facility], NP  ? 9 months ago Pulmonary emphysema, unspecified emphysema type (HCC)  ? [facility], NP  ? ?  ?  ?Future Appointments   ? ?        ? In 4 months Larae Grooms, NP [facility]  ? ?  ? ?  ?  ?  ? ? ? ?Requested Prescriptions  ?Pending Prescriptions Disp Refills  ? rOPINIRole (REQUIP) 4 MG tablet [Pharmacy Med Name: ROPINIROLE 4MG  TABLETS] 450 tablet 1  ?  Sig: TAKE 1 TABLET BY MOUTH FIVE TIMES DAILY  ?  ? Neurology:  Parkinsonian Agents Passed - 12/12/2021  1:03 PM  ?  ?  Passed - Last BP in normal range  ?  BP Readings from Last 1  Encounters:  ?11/01/21 105/69  ?  ?  ?  ?  Passed - Last Heart Rate in normal range  ?  Pulse Readings from Last 1 Encounters:  ?11/01/21 71  ?  ?  ?  ?  Passed - Valid encounter within last 12 months  ?  Recent Outpatient Visits   ? ?      ? 1 month ago Chronic bilateral low back pain with sciatica, sciatica laterality unspecified  ? [facility] 11/03/21, NP  ? 5 months ago Chronic bilateral low back pain with sciatica, sciatica laterality unspecified  ? [facility] Larae Grooms, NP  ? 6 months ago Cough  ? [facility] Larae Grooms, NP  ? 8 months ago Centrilobular emphysema (HCC)  ? [facility] Larae Grooms, NP  ? 9 months ago Pulmonary emphysema, unspecified emphysema type (HCC)  ? [facility] Larae Grooms, NP  ? ?  ?  ?Future Appointments   ? ?        ? In 4 months [facility], NP [facility]  ? ?  ? ?  ?  ?  ? ? ? ?

## 2021-12-13 NOTE — Telephone Encounter (Signed)
Duplicate request- denied by office today ?Requested Prescriptions  ?Pending Prescriptions Disp Refills  ?? rOPINIRole (REQUIP) 4 MG tablet [Pharmacy Med Name: ROPINIROLE 4MG  TABLETS] 450 tablet 1  ?  Sig: TAKE 1 TABLET BY MOUTH FIVE TIMES DAILY  ?  ? Neurology:  Parkinsonian Agents Passed - 12/13/2021 11:09 AM  ?  ?  Passed - Last BP in normal range  ?  BP Readings from Last 1 Encounters:  ?11/01/21 105/69  ?   ?  ?  Passed - Last Heart Rate in normal range  ?  Pulse Readings from Last 1 Encounters:  ?11/01/21 71  ?   ?  ?  Passed - Valid encounter within last 12 months  ?  Recent Outpatient Visits   ?      ? 1 month ago Chronic bilateral low back pain with sciatica, sciatica laterality unspecified  ? [facility], NP  ? 5 months ago Chronic bilateral low back pain with sciatica, sciatica laterality unspecified  ? [facility], NP  ? 6 months ago Cough  ? [facility], NP  ? 8 months ago Centrilobular emphysema (HCC)  ? [facility], NP  ? 9 months ago Pulmonary emphysema, unspecified emphysema type (HCC)  ? [facility], NP  ?  ?  ?Future Appointments   ?        ? In 4 months Larae Grooms, NP [facility]  ?  ? ?  ?  ?  ? ?

## 2021-12-14 DIAGNOSIS — S22000A Wedge compression fracture of unspecified thoracic vertebra, initial encounter for closed fracture: Secondary | ICD-10-CM | POA: Diagnosis not present

## 2021-12-14 DIAGNOSIS — R32 Unspecified urinary incontinence: Secondary | ICD-10-CM | POA: Diagnosis not present

## 2021-12-23 ENCOUNTER — Ambulatory Visit (HOSPITAL_BASED_OUTPATIENT_CLINIC_OR_DEPARTMENT_OTHER): Payer: Medicaid Other | Admitting: Pain Medicine

## 2021-12-23 DIAGNOSIS — M899 Disorder of bone, unspecified: Secondary | ICD-10-CM

## 2021-12-23 DIAGNOSIS — M5134 Other intervertebral disc degeneration, thoracic region: Secondary | ICD-10-CM | POA: Insufficient documentation

## 2021-12-23 DIAGNOSIS — R937 Abnormal findings on diagnostic imaging of other parts of musculoskeletal system: Secondary | ICD-10-CM | POA: Insufficient documentation

## 2021-12-23 DIAGNOSIS — G894 Chronic pain syndrome: Secondary | ICD-10-CM

## 2021-12-23 DIAGNOSIS — M4807 Spinal stenosis, lumbosacral region: Secondary | ICD-10-CM | POA: Insufficient documentation

## 2021-12-23 DIAGNOSIS — M4310 Spondylolisthesis, site unspecified: Secondary | ICD-10-CM

## 2021-12-23 DIAGNOSIS — Z789 Other specified health status: Secondary | ICD-10-CM | POA: Insufficient documentation

## 2021-12-23 DIAGNOSIS — M51379 Other intervertebral disc degeneration, lumbosacral region without mention of lumbar back pain or lower extremity pain: Secondary | ICD-10-CM

## 2021-12-23 DIAGNOSIS — Z79899 Other long term (current) drug therapy: Secondary | ICD-10-CM | POA: Insufficient documentation

## 2021-12-23 DIAGNOSIS — Z1239 Encounter for other screening for malignant neoplasm of breast: Secondary | ICD-10-CM | POA: Insufficient documentation

## 2021-12-23 DIAGNOSIS — M47817 Spondylosis without myelopathy or radiculopathy, lumbosacral region: Secondary | ICD-10-CM | POA: Insufficient documentation

## 2021-12-23 DIAGNOSIS — M48061 Spinal stenosis, lumbar region without neurogenic claudication: Secondary | ICD-10-CM

## 2021-12-23 DIAGNOSIS — M47892 Other spondylosis, cervical region: Secondary | ICD-10-CM

## 2021-12-23 DIAGNOSIS — M5030 Other cervical disc degeneration, unspecified cervical region: Secondary | ICD-10-CM

## 2021-12-23 DIAGNOSIS — M5137 Other intervertebral disc degeneration, lumbosacral region: Secondary | ICD-10-CM

## 2021-12-23 DIAGNOSIS — Z91199 Patient's noncompliance with other medical treatment and regimen due to unspecified reason: Secondary | ICD-10-CM

## 2021-12-23 HISTORY — DX: Spinal stenosis, lumbosacral region: M48.07

## 2021-12-23 HISTORY — DX: Other spondylosis, cervical region: M47.892

## 2021-12-23 HISTORY — DX: Other intervertebral disc degeneration, lumbosacral region without mention of lumbar back pain or lower extremity pain: M51.379

## 2021-12-23 HISTORY — DX: Other cervical disc degeneration, unspecified cervical region: M50.30

## 2021-12-23 HISTORY — DX: Other specified health status: Z78.9

## 2021-12-23 HISTORY — DX: Disorder of bone, unspecified: M89.9

## 2021-12-23 HISTORY — DX: Other intervertebral disc degeneration, lumbosacral region: M51.37

## 2021-12-23 HISTORY — DX: Chronic pain syndrome: G89.4

## 2021-12-23 HISTORY — DX: Spondylosis without myelopathy or radiculopathy, lumbosacral region: M47.817

## 2021-12-23 HISTORY — DX: Spinal stenosis, lumbar region without neurogenic claudication: M48.061

## 2021-12-23 HISTORY — DX: Other long term (current) drug therapy: Z79.899

## 2021-12-23 HISTORY — DX: Other intervertebral disc degeneration, thoracic region: M51.34

## 2021-12-23 HISTORY — DX: Abnormal findings on diagnostic imaging of other parts of musculoskeletal system: R93.7

## 2021-12-23 HISTORY — DX: Spondylolisthesis, site unspecified: M43.10

## 2021-12-23 NOTE — Progress Notes (Deleted)
NO-SHOW to initial evaluation (12/23/2021) ?

## 2021-12-23 NOTE — Patient Instructions (Signed)
____________________________________________________________________________________________  General Risks and Possible Complications  Patient Responsibilities: It is important that you read this as it is part of your informed consent. It is our duty to inform you of the risks and possible complications associated with treatments offered to you. It is your responsibility as a patient to read this and to ask questions about anything that is not clear or that you believe was not covered in this document.  Patient's Rights: You have the right to refuse treatment. You also have the right to change your mind, even after initially having agreed to have the treatment done. However, under this last option, if you wait until the last second to change your mind, you may be charged for the materials used up to that point.  Introduction: Medicine is not an exact science. Everything in Medicine, including the lack of treatment(s), carries the potential for danger, harm, or loss (which is by definition: Risk). In Medicine, a complication is a secondary problem, condition, or disease that can aggravate an already existing one. All treatments carry the risk of possible complications. The fact that a side effects or complications occurs, does not imply that the treatment was conducted incorrectly. It must be clearly understood that these can happen even when everything is done following the highest safety standards.  No treatment: You can choose not to proceed with the proposed treatment alternative. The "PRO(s)" would include: avoiding the risk of complications associated with the therapy. The "CON(s)" would include: not getting any of the treatment benefits. These benefits fall under one of three categories: diagnostic; therapeutic; and/or palliative. Diagnostic benefits include: getting information which can ultimately lead to improvement of the disease or symptom(s). Therapeutic benefits are those associated with  the successful treatment of the disease. Finally, palliative benefits are those related to the decrease of the primary symptoms, without necessarily curing the condition (example: decreasing the pain from a flare-up of a chronic condition, such as incurable terminal cancer).  General Risks and Complications: These are associated to most interventional treatments. They can occur alone, or in combination. They fall under one of the following six (6) categories: no benefit or worsening of symptoms; bleeding; infection; nerve damage; allergic reactions; and/or death. No benefits or worsening of symptoms: In Medicine there are no guarantees, only probabilities. No healthcare provider can ever guarantee that a medical treatment will work, they can only state the probability that it may. Furthermore, there is always the possibility that the condition may worsen, either directly, or indirectly, as a consequence of the treatment. Bleeding: This is more common if the patient is taking a blood thinner, either prescription or over the counter (example: Goody Powders, Fish oil, Aspirin, Garlic, etc.), or if suffering a condition associated with impaired coagulation (example: Hemophilia, cirrhosis of the liver, low platelet counts, etc.). However, even if you do not have one on these, it can still happen. If you have any of these conditions, or take one of these drugs, make sure to notify your treating physician. Infection: This is more common in patients with a compromised immune system, either due to disease (example: diabetes, cancer, human immunodeficiency virus [HIV], etc.), or due to medications or treatments (example: therapies used to treat cancer and rheumatological diseases). However, even if you do not have one on these, it can still happen. If you have any of these conditions, or take one of these drugs, make sure to notify your treating physician. Nerve Damage: This is more common when the treatment is an    invasive one, but it can also happen with the use of medications, such as those used in the treatment of cancer. The damage can occur to small secondary nerves, or to large primary ones, such as those in the spinal cord and brain. This damage may be temporary or permanent and it may lead to impairments that can range from temporary numbness to permanent paralysis and/or brain death. Allergic Reactions: Any time a substance or material comes in contact with our body, there is the possibility of an allergic reaction. These can range from a mild skin rash (contact dermatitis) to a severe systemic reaction (anaphylactic reaction), which can result in death. Death: In general, any medical intervention can result in death, most of the time due to an unforeseen complication. ____________________________________________________________________________________________    

## 2021-12-24 NOTE — Progress Notes (Signed)
This patient cancelled, rescheduled, or was a "NO SHOW" to this appointment. ?

## 2022-01-06 ENCOUNTER — Other Ambulatory Visit: Payer: Self-pay | Admitting: *Deleted

## 2022-01-06 ENCOUNTER — Encounter: Payer: Self-pay | Admitting: Nurse Practitioner

## 2022-01-06 MED ORDER — ROPINIROLE HCL 4 MG PO TABS: ORAL_TABLET | ORAL | 1 refills | Status: DC

## 2022-01-06 NOTE — Patient Instructions (Signed)
Visit Information ? ?Ms. Robeck was given information about Medicaid Managed Care team care coordination services as a part of their [facility] Medicaid benefit. Celine Mans Oertel verbally consented to engagement with the [facility] Managed Care team.  ? ?If you are experiencing a medical emergency, please call 911 or report to your local emergency department or urgent care.  ? ?If you have a non-emergency medical problem during routine business hours, please contact your provider's office and ask to speak with a nurse.  ? ?For questions related to your [facility], please call: [phone] or visit the homepage here: [email or web address] ? ?If you would like to schedule transportation through your [facility], please call the following number at least 2 days in advance of your appointment: [phone]. ? Rides for urgent appointments can also be made after hours by calling Member Services. ? ?Call the Behavioral Health Crisis Line at [phone], at any time, 24 hours a day, 7 days a week. If you are in danger or need immediate medical attention call 911. ? ?If you would like help to quit smoking, call 1-800-QUIT-NOW (862-683-5017) OR Espa?ol: 1-855-D?jelo-Ya 5757663553) o para m?s informaci?n haga clic aqu? or Text READY to 200-400 to register via text ? ?Ms. Brusca, ? ? ?Please see education materials related to smoking and COPD provided by MyChart link. ? ?Patient verbalizes understanding of instructions and care plan provided today and agrees to view in MyChart. Active MyChart status confirmed with patient.   ? ?Telephone follow up appointment with Managed Medicaid care management team member scheduled for:02/05/22 @ 11:15am ? ?[name] RN, BSN ?[place] ?RN Care Coordinator ? ? ?Following is a copy of your plan of care:  ?Care Plan : RN Care  Manager Plan of Care  ?Updates made by Heidi Dach, RN since 01/06/2022 12:00 AM  ?  ? ?Problem: Chronic health management needs related to pain and COPD   ?  ? ?Long-Range Goal: Development of Plan of Care to address health management needs related to pain and COPD   ?Start Date: 08/22/2021  ?Expected End Date: 02/12/2022  ?Priority: High  ?Note:   ?Current Barriers:  ?Chronic Disease Management support and education needs related to COPD and pain ?Ms. Wieber is having increased back pain since returning to [place] from [place]. She was unable to attend appointment with Pain Management and needs to reschedule. She was unaware of referral to [facility] Pulmon[facility].  ? ?RNCM Clinical Goal(s):  ?Patient will verbalize understanding of plan for management of COPD and pain as evidenced by patient verbalization and self monitoring activities ?take all medications exactly as prescribed and will call provider for medication related questions as evidenced by documentation in EMR    ?attend all scheduled medical appointments:  Rescheduling with Pain Management and scheduling with [facility] Pulmonology as evidenced by provider documentation in EMR        through collaboration with RN Care manager, provider, and care team.  ? ?Interventions: ?Inter-disciplinary care team collaboration (see longitudinal plan of care) ?Evaluation of current treatment plan related to  self management and patient's adherence to plan as established by provider  ?Provided therapeutic listening ? ? ? ?COPD: (Status: Goal on track: NO.) Long Term Goal  ?Reviewed medications with patient, including use of prescribed maintenance and rescue inhalers, and provided instruction on medication management and the importance of adherence ?Advised patient to track and manage COPD triggers ?Provided education about and  advised patient to utilize infection prevention strategies to reduce risk of respiratory infection ?Discussed the importance of adequate rest and management of  fatigue with COPD ?Assessed social determinant of health barriers ?Provided patient with [facility] [phone], referral sent by PCP ?Discussed smoking cessation, education provided-patient is not ready to work toward smoking cessation ? ?Pain:  (Status: Goal on track: NO.) Long Term Goal  ?Pain assessment performed ?Medications reviewed ?Reviewed provider established plan for pain management; ?Discussed importance of adherence to all scheduled medical appointments; ?Counseled on the importance of reporting any/all new or changed pain symptoms or management strategies to pain management provider; ?Discussed use of relaxation techniques and/or diversional activities to assist with pain reduction (distraction, imagery, relaxation, massage, acupressure, TENS, heat, and cold application; ?Reviewed with patient prescribed pharmacological and nonpharmacological pain relief strategies; ?Assessed social determinant of health barriers;  ?Advised patient to call to reschedule with Pain Management Clinic ? ? ?Patient Goals/Self-Care Activities: ?Take medications as prescribed   ?Attend all scheduled provider appointments ?Call pharmacy for medication refills 3-7 days in advance of running out of medications ?Call provider office for new concerns or questions  ?- develop a rescue plan ?- use an extra pillow to sleep ?- practice relaxation or meditation daily ?Work on cutting back on smoking ? ? ?  ?  ?

## 2022-01-06 NOTE — Patient Outreach (Signed)
?Medicaid Managed Care   ?Nurse Care Manager Note ? ?01/06/2022 ?Name:  Kim [name] MRN:  [id number] DOB:  [birth year] ? ?Kim [name] is an 56 y.o. year old female who is [name] primary patient of Kim Billings, NP.  The [facility] Managed Care Coordination team was consulted for assistance with:    ?COPD ?Chronic pain ? ?Kim [name] was given information about Medicaid Managed Care Coordination team services today. Kim [name] Patient agreed to services and verbal consent obtained. ? ?Engaged with patient by telephone for follow up visit in response to provider referral for case management and/or care coordination services.  ? ?Assessments/Interventions:  Review of past medical history, allergies, medications, health status, including review of consultants reports, laboratory and other test data, was performed as part of comprehensive evaluation and provision of chronic care management services. ? ?SDOH (Social Determinants of Health) assessments and interventions performed: ? ? ?Care Plan ? ?Allergies  ?Allergen Reactions  ? Penicillins Other (See Comments)  ?  Unknown childhood reaction  ? Sulfa Antibiotics Swelling  ? ? ?Medications Reviewed Today   ? ? Reviewed by Kim Montane, RN (Registered Nurse) on 01/06/22 at 1044  Med List Status: <None>  ? ?Medication Order Taking? Sig Documenting Provider Last Dose Status Informant  ?Aspirin-Salicylamide-Caffeine (BC HEADACHE POWDER PO) [id number] Yes Take by mouth. [provider] Taking Active   ?         ?Med Note (Kim [name]   Thu Nov 14, 2021 10:46 AM) Taking 25 [name] week  ?fluticasone (FLONASE) 50 MCG/ACT nasal spray [id number] Yes shake liquid AND INSTILL 1 SPRAY IN EACH NOSTRIL DAILY Kim Billings, NP Taking Active   ?ondansetron (ZOFRAN-ODT) 8 MG disintegrating tablet [id number] Yes DISSOLVE 1 TABLET BY MOUTH EVERY 8 HOURS AS NEEDED FOR NAUSEA OR VOMITING Kim Billings, NP Taking Active   ?ramelteon (ROZEREM) 8 MG tablet  [id number] Yes Take 8 mg by mouth at bedtime. [provider] Taking Active   ?rOPINIRole (REQUIP) 4 MG tablet [id number] Yes TAKE 1 TABLET BY MOUTH FIVE TIMES DAILY Kim Billings, NP Taking Active   ?rosuvastatin (CRESTOR) 5 MG tablet [id number] Yes Take 1 tablet (5 mg total) by mouth daily. Kim Billings, NP Taking Active   ?SUMAtriptan 6 MG/0.5ML SOAJ [id number] Yes INJECT 0.5 ML INTO THE SKIN AT ONSET OF HEADACHE. MAY REPEAT AFTER 1 HOUR IF NO BETTER. MAXIMUM OF 2 DOSES IN [name], [name], NP Taking Active   ? ?  ?  ? ?  ? ? ?Patient Active Problem List  ? Diagnosis Date Noted  ? Chronic pain syndrome 12/23/2021  ? Pharmacologic therapy 12/23/2021  ? Disorder of skeletal system 12/23/2021  ? Problems influencing health status 12/23/2021  ? Abnormal MRI, lumbar spine (10/28/2021) 12/23/2021  ? Other spondylosis, cervical region (C4/C5 and C5/C6) 12/23/2021  ? Grade 1 Retrolisthesis of cervical region (2 mm) (C4/C5) 12/23/2021  ? DDD (degenerative disc disease), cervical 12/23/2021  ? DDD (degenerative disc disease), lumbosacral 12/23/2021  ? DDD (degenerative disc disease), thoracic 12/23/2021  ? Lumbosacral facet arthropathy (Multilevel) (Bilateral) 12/23/2021  ? Lumbosacral foraminal stenosis 12/23/2021  ? Lumbar lateral recess stenosis 12/23/2021  ? Stress incontinence 11/01/2021  ? Chronic bilateral low back pain with sciatica 07/04/2021  ? Upper airway cough syndrome 05/01/2021  ? Cigarette smoker 05/01/2021  ? Multiple pulmonary nodules determined by computed tomography of lung 05/01/2021  ? Rectal bleeding 02/19/2021  ? History of ileus 02/19/2021  ? Aortic atherosclerosis ([place]) 01/14/2021  ?  Headache disorder 09/20/2020  ? Rebound headache 09/20/2020  ? Lumbar radiculopathy 08/01/2020  ? Cervical radiculopathy 08/01/2020  ? T12 compression fracture, sequela 08/01/2020  ? PTSD (post-traumatic stress disorder) 04/05/2020  ? GERD (gastroesophageal reflux disease)   ? Centrilobular  emphysema ([place]) 02/21/2016  ? RLS (restless legs syndrome) 07/02/2015  ? Migraines 07/02/2015  ? COPD GOLD ? / active smoker 06/25/2011  ? Nicotine dependence, cigarettes, w unsp disorders 06/25/2011  ? ? ?Conditions to be addressed/monitored per PCP order:  COPD and chronic pain ? ?Care Plan : RN Care Manager Plan of Care  ?Updates made by Kim Montane, RN since 01/06/2022 12:00 AM  ?  ? ?Problem: Chronic health management needs related to pain and COPD   ?  ? ?Long-Range Goal: Development of Plan of Care to address health management needs related to pain and COPD   ?Start Date: 08/22/2021  ?Expected End Date: 02/12/2022  ?Priority: High  ?Note:   ?Current Barriers:  ?Chronic Disease Management support and education needs related to COPD and pain ?Kim [name] is having increased back pain since returning to [place] from [place]. She was unable to attend appointment with Pain Management and needs to reschedule. She was unaware of referral to [place].  ? ?RNCM Clinical Goal(s):  ?Patient will verbalize understanding of plan for management of COPD and pain as evidenced by patient verbalization and self monitoring activities ?take all medications exactly as prescribed and will call provider for medication related questions as evidenced by documentation in EMR    ?attend all scheduled medical appointments:  Rescheduling with Pain Management and scheduling with [facility] Pulmonology as evidenced by provider documentation in EMR        through collaboration with RN Care manager, provider, and care team.  ? ?Interventions: ?Inter-disciplinary care team collaboration (see longitudinal plan of care) ?Evaluation of current treatment plan related to  self management and patient's adherence to plan as established by provider  ?Provided therapeutic listening ? ? ? ?COPD: (Status: Goal on track: NO.) Long Term Goal  ?Reviewed medications with patient, including use of prescribed maintenance and rescue inhalers, and provided  instruction on medication management and the importance of adherence ?Advised patient to track and manage COPD triggers ?Provided education about and advised patient to utilize infection prevention strategies to reduce risk of respiratory infection ?Discussed the importance of adequate rest and management of fatigue with COPD ?Assessed social determinant of health barriers ?Provided patient with [facility] [phone], referral sent by PCP ?Discussed smoking cessation, education provided-patient is not ready to work toward smoking cessation ? ?Pain:  (Status: Goal on track: NO.) Long Term Goal  ?Pain assessment performed ?Medications reviewed ?Reviewed provider established plan for pain management; ?Discussed importance of adherence to all scheduled medical appointments; ?Counseled on the importance of reporting any/all new or changed pain symptoms or management strategies to pain management provider; ?Discussed use of relaxation techniques and/or diversional activities to assist with pain reduction (distraction, imagery, relaxation, massage, acupressure, TENS, heat, and cold application; ?Reviewed with patient prescribed pharmacological and nonpharmacological pain relief strategies; ?Assessed social determinant of health barriers;  ?Advised patient to call to reschedule with Pain Management Clinic ? ? ?Patient Goals/Self-Care Activities: ?Take medications as prescribed   ?Attend all scheduled provider appointments ?Call pharmacy for medication refills 3-7 days in advance of running out of medications ?Call provider office for new concerns or questions  ?- develop [name] rescue plan ?- use an extra pillow to sleep ?- practice relaxation or meditation  daily ?Work on cutting back on smoking ? ? ?  ? ? ?Follow Up:  Patient agrees to Care Plan and Follow-up. ? ?Plan: The Managed Medicaid care management team will reach out to the patient again over the next 30 days. ? ?Date/time of next scheduled RN care  management/care coordination outreach:  02/05/22 @ 11:15am ? ?Lurena Joiner RN, BSN ?[place] ?RN Care Coordinator ? ?

## 2022-01-08 ENCOUNTER — Encounter: Payer: Self-pay | Admitting: Nurse Practitioner

## 2022-01-08 ENCOUNTER — Other Ambulatory Visit: Payer: Self-pay

## 2022-01-08 ENCOUNTER — Encounter: Payer: Self-pay | Admitting: Pain Medicine

## 2022-01-08 ENCOUNTER — Ambulatory Visit: Payer: Medicaid Other | Attending: Pain Medicine | Admitting: Pain Medicine

## 2022-01-08 VITALS — BP 122/86 | HR 104 | Temp 97.4°F | Resp 18 | Ht 66.0 in | Wt 211.0 lb

## 2022-01-08 DIAGNOSIS — M25551 Pain in right hip: Secondary | ICD-10-CM | POA: Diagnosis not present

## 2022-01-08 DIAGNOSIS — G4486 Cervicogenic headache: Secondary | ICD-10-CM

## 2022-01-08 DIAGNOSIS — M25511 Pain in right shoulder: Secondary | ICD-10-CM | POA: Diagnosis present

## 2022-01-08 DIAGNOSIS — Z79899 Other long term (current) drug therapy: Secondary | ICD-10-CM | POA: Insufficient documentation

## 2022-01-08 DIAGNOSIS — M47894 Other spondylosis, thoracic region: Secondary | ICD-10-CM | POA: Insufficient documentation

## 2022-01-08 DIAGNOSIS — M25552 Pain in left hip: Secondary | ICD-10-CM

## 2022-01-08 DIAGNOSIS — G894 Chronic pain syndrome: Secondary | ICD-10-CM | POA: Diagnosis not present

## 2022-01-08 DIAGNOSIS — G5603 Carpal tunnel syndrome, bilateral upper limbs: Secondary | ICD-10-CM | POA: Diagnosis present

## 2022-01-08 DIAGNOSIS — G8929 Other chronic pain: Secondary | ICD-10-CM | POA: Insufficient documentation

## 2022-01-08 DIAGNOSIS — Z789 Other specified health status: Secondary | ICD-10-CM | POA: Diagnosis not present

## 2022-01-08 DIAGNOSIS — M542 Cervicalgia: Secondary | ICD-10-CM

## 2022-01-08 DIAGNOSIS — M5136 Other intervertebral disc degeneration, lumbar region: Secondary | ICD-10-CM

## 2022-01-08 DIAGNOSIS — M4807 Spinal stenosis, lumbosacral region: Secondary | ICD-10-CM | POA: Insufficient documentation

## 2022-01-08 DIAGNOSIS — M47816 Spondylosis without myelopathy or radiculopathy, lumbar region: Secondary | ICD-10-CM | POA: Insufficient documentation

## 2022-01-08 DIAGNOSIS — R200 Anesthesia of skin: Secondary | ICD-10-CM | POA: Insufficient documentation

## 2022-01-08 DIAGNOSIS — M899 Disorder of bone, unspecified: Secondary | ICD-10-CM | POA: Insufficient documentation

## 2022-01-08 DIAGNOSIS — M545 Low back pain, unspecified: Secondary | ICD-10-CM

## 2022-01-08 DIAGNOSIS — M5134 Other intervertebral disc degeneration, thoracic region: Secondary | ICD-10-CM | POA: Insufficient documentation

## 2022-01-08 DIAGNOSIS — R519 Headache, unspecified: Secondary | ICD-10-CM

## 2022-01-08 DIAGNOSIS — S22080S Wedge compression fracture of T11-T12 vertebra, sequela: Secondary | ICD-10-CM | POA: Diagnosis not present

## 2022-01-08 DIAGNOSIS — M5030 Other cervical disc degeneration, unspecified cervical region: Secondary | ICD-10-CM | POA: Diagnosis not present

## 2022-01-08 DIAGNOSIS — M549 Dorsalgia, unspecified: Secondary | ICD-10-CM | POA: Diagnosis present

## 2022-01-08 DIAGNOSIS — M25512 Pain in left shoulder: Secondary | ICD-10-CM | POA: Insufficient documentation

## 2022-01-08 DIAGNOSIS — R202 Paresthesia of skin: Secondary | ICD-10-CM | POA: Insufficient documentation

## 2022-01-08 DIAGNOSIS — M4310 Spondylolisthesis, site unspecified: Secondary | ICD-10-CM | POA: Insufficient documentation

## 2022-01-08 DIAGNOSIS — M47812 Spondylosis without myelopathy or radiculopathy, cervical region: Secondary | ICD-10-CM

## 2022-01-08 DIAGNOSIS — M51369 Other intervertebral disc degeneration, lumbar region without mention of lumbar back pain or lower extremity pain: Secondary | ICD-10-CM

## 2022-01-08 DIAGNOSIS — M546 Pain in thoracic spine: Secondary | ICD-10-CM

## 2022-01-08 DIAGNOSIS — M79605 Pain in left leg: Secondary | ICD-10-CM | POA: Diagnosis not present

## 2022-01-08 HISTORY — DX: Spinal stenosis, lumbosacral region: M48.07

## 2022-01-08 HISTORY — DX: Other spondylosis, thoracic region: M47.894

## 2022-01-08 HISTORY — DX: Other intervertebral disc degeneration, lumbar region without mention of lumbar back pain or lower extremity pain: M51.369

## 2022-01-08 HISTORY — DX: Anesthesia of skin: R20.0

## 2022-01-08 HISTORY — DX: Carpal tunnel syndrome, bilateral upper limbs: G56.03

## 2022-01-08 HISTORY — DX: Cervicogenic headache: G44.86

## 2022-01-08 HISTORY — DX: Headache, unspecified: R51.9

## 2022-01-08 HISTORY — DX: Cervicalgia: M54.2

## 2022-01-08 HISTORY — DX: Spondylosis without myelopathy or radiculopathy, cervical region: M47.812

## 2022-01-08 HISTORY — DX: Other intervertebral disc degeneration, lumbar region: M51.36

## 2022-01-08 HISTORY — DX: Other chronic pain: G89.29

## 2022-01-08 HISTORY — DX: Spondylosis without myelopathy or radiculopathy, lumbar region: M47.816

## 2022-01-08 HISTORY — DX: Spondylolisthesis, site unspecified: M43.10

## 2022-01-08 NOTE — Progress Notes (Signed)
Patient: Kim [name]  Service Category: E/M  Provider: Oswaldo Done, MD  ?DOB: September 04, 1966  DOS: 01/08/2022  Referring Provider: Larae Grooms, [name]  ?MRN: [id number]  Setting: Ambulatory outpatient  PCP: Kim [name]  ?Type: New Patient  Specialty: Interventional Pain Management    ?Location: Office  Delivery: Face-to-face    ? ?Primary Reason(s) for Visit: Encounter for initial evaluation of one or more chronic problems (new to examiner) potentially causing chronic pain, and posing a threat to normal musculoskeletal function. (Level of risk: High) ?CC: Back Pain (Whole spine) and Hip Pain (Bilateral -left is worse) ? ?HPI  ?Kim [name] is a 56 y.o. year old, female patient, who comes for the first time to our practice referred by Kim [name] for our initial evaluation of her chronic pain. She has RLS (restless legs syndrome); Migraines; COPD GOLD ? / active smoker; Nicotine dependence, cigarettes, w unsp disorders; Centrilobular emphysema (HCC); GERD (gastroesophageal reflux disease); PTSD (post-traumatic stress disorder); Lumbar radiculopathy; Cervical radiculopathy; T12 compression fracture, sequela; Aortic atherosclerosis (HCC); Rectal bleeding; History of ileus; Headache disorder; Upper airway cough syndrome; Cigarette smoker; Multiple pulmonary nodules determined by computed tomography of lung; Chronic low back pain (1ry area of Pain) (Bilateral) (L>R) w/o sciatica; Stress incontinence; Chronic pain syndrome; Pharmacologic therapy; Disorder of skeletal system; Problems influencing health status; Abnormal MRI, lumbar spine (10/28/2021); Other spondylosis, cervical region (C4/C5 and C5/C6); Grade 1 Retrolisthesis of cervical region (2 mm) (C4/C5); DDD (degenerative disc disease), cervical; DDD (degenerative disc disease), lumbosacral; DDD (degenerative disc disease), thoracic; Lumbosacral facet arthropathy (Multilevel) (Bilateral); 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)  (Bilateral: L5-S1); Lumbar lateral recess stenosis (L3-4) (Left); Grade 1 Retrolisthesis of L2/L3 and L3/L4; Lumbar disc annular tears; Lumbosacral lateral recess stenosis (L3-4, L5-S1) (Left); Chronic hip pain (2ry area of Pain) (Bilateral) (L>R); Chronic lower extremity pain (3ry area of Pain) (Intermittent) (Left); Chronic thoracic back pain (4th area of Pain) (Midline) (Bilateral); Chronic neck and back pain (5th area of Pain) (Midline); Cervicalgia; Cervicogenic headache; Occipital headache (Bilateral); Chronic shoulder pain (6th area of Pain) (Bilateral); Carpal tunnel syndrome (Bilateral); Numbness and tingling in hands (Bilateral); Lumbar facet syndrome (Bilateral); Thoracic facet syndrome; and Cervical facet syndrome on their problem list. Today she comes in for evaluation of her Back Pain (Whole spine) and Hip Pain (Bilateral -left is worse) ? ?Pain Assessment: ?Location: Upper, Mid, Lower Back ?Radiating: bilateral hips ?Onset: More than a month ago ?Duration: Chronic pain ?Quality: Constant, Aching, Dull, Sharp ?Severity: 6 /10 (subjective, self-reported pain score)  ?Effect on ADL:   ?Timing: Constant ?Modifying factors: heat, BC powder, rest, activity in the morning ?BP: 122/86  HR: (!) 104 ? ?Onset and Duration: Present longer than 3 months ?Cause of pain: Unknown ?Severity: Getting worse, NAS-11 at its worse: 10/10, NAS-11 at its best: 7/10, NAS-11 now: 7/10, and NAS-11 on the average: 7/10 ?Timing: Morning, Afternoon, Night, During activity or exercise, After activity or exercise, and After a period of immobility ?Aggravating Factors: Bending, Bowel movements, Climbing, Kneeling, Lifiting, Motion, Prolonged sitting, Prolonged standing, Squatting, Stooping , Twisting, Walking, Walking uphill, Walking downhill, and Working ?Alleviating Factors: Hot packs ?Associated Problems: Dizziness, Fatigue, Inability to concentrate, Inability to control bladder (urine), Inability to control bowel, Nausea,  Numbness, Personality changes, Spasms, Sweating, Swelling, Temperature changes, Tingling, Vomiting , Weakness, Pain that wakes patient up, and Pain that does not allow patient to sleep ?Quality of Pain: Aching, Agonizing, Annoying, Deep, Disabling, Exhausting, Feeling of constriction, Feeling of weight, Getting longer, Nagging, Pressure-like, Sharp,  Shooting, Stabbing, Tender, Throbbing, Tingling, Toothache-like, and Uncomfortable ?Previous Examinations or Tests: MRI scan and X-rays ?Previous Treatments: The patient denies any previous treatments ? ?According to the patient the primary area of pain is that of the lower back (Bilateral) (L>R).  She denies any prior surgeries but she admits to recently having had an MRI of the lumbar spine and also having tried some physical therapy which only lasted 1 day due to severe pain upon attempting to do that physical therapy.  The patient also indicates having had 1 nerve block [name] at [facility] in [place].  The benefit only lasted 2 days and she did not go back for follow-up after that. ? ?The patient's second area pain is that of the hips (Bilateral) (L>R).  The patient denies any prior surgeries, recent x-rays, nerve blocks, joint injections, or physical therapy for this hip pain. ? ?The patient's third area pain is that of the left lower extremity.  However, this pain is described to be intermittent and currently she is not experiencing any lower extremity pain.  When she does not get the pain she describes this pain as going all the way down to her foot.  She describes that when he gets into the foot it is usually over the top and the outside of the foot while also given her numbness in the bottom of the foot.  This would suggest intermittent irritation of the left L5 and left S1 nerve roots. ? ?The fourth area pain is described to be that of the upper back (Midline) (Bilateral).  She describes this pain to reach all the way up to the neck area.  She denies any  radicular component to this pain. ? ?The patient's fifth area pain is that of the neck (posterior) (Midline).  The patient indicates that this pain is usually associated with headaches that she refers started in the occipital region and go to the top of the head and what appears to be the distribution of the greater occipital nerve bilaterally.  She refers that she has been treating this for many years with Imitrex injections which she gets at a practice in [place].  The headaches seem to be bilateral with the right side being just as bad as the left. ? ?The patient's sixth area pain is that of the shoulder (Bilateral).  She denies any shoulder surgeries, injections, physical therapy, or any recent x-rays. ? ?The patient's seventh area pain is that of the hands (Bilateral) where she describes having numbness in her fingers, especially when she is driving and occasionally having also pain.  She has a distant history of having had a right-sided carpal tunnel surgery. ? ?Pharmacotherapy: The patient indicates trying to control her pain by taking BC powders.  She refers taking more than 25 BCs per week. ? ?Physical exam: The patient was able to heel walk and toe walk without any problems or deficits.  Straight leg raise was negative but with significant decreased range of motion bilaterally and exquisite tenderness and pain in the lower back but no radicular or dermatomal pain upon attempting the maneuver.  Hyperextension or rotation maneuver of the lumbar spine as well as Marden Noble maneuver were both positive bilaterally for facet joint arthralgia.  Maneuvers trigger pain ipsilaterally.  Provocative Patrick maneuver was positive bilaterally for sacroiliac joint arthralgia and positive on the left side for hip joint arthralgia.  Evaluation of the carpal tunnel syndrome was positive bilaterally for Phalen's test and Tinel's test.  The patient denies ever  having had any nerve conduction test of the lower or upper  extremities. ? ?Today I took the time to provide the patient with information regarding my pain practice. The patient was informed that my practice is divided into two sections: an interventional pain management section, as wel

## 2022-01-08 NOTE — Progress Notes (Signed)
Safety precautions to be maintained throughout the outpatient stay will include: orient to surroundings, keep bed in low position, maintain call bell within reach at all times, provide assistance with transfer out of bed and ambulation.  

## 2022-01-09 ENCOUNTER — Ambulatory Visit
Admission: RE | Admit: 2022-01-09 | Discharge: 2022-01-09 | Disposition: A | Discharge status: Home / Self Care | Payer: Medicaid Other | Source: Ambulatory Visit | Attending: Pain Medicine | Admitting: Pain Medicine

## 2022-01-09 DIAGNOSIS — G8929 Other chronic pain: Secondary | ICD-10-CM | POA: Insufficient documentation

## 2022-01-09 DIAGNOSIS — M25511 Pain in right shoulder: Secondary | ICD-10-CM | POA: Insufficient documentation

## 2022-01-09 DIAGNOSIS — M25551 Pain in right hip: Secondary | ICD-10-CM | POA: Diagnosis not present

## 2022-01-09 DIAGNOSIS — M25552 Pain in left hip: Secondary | ICD-10-CM | POA: Diagnosis not present

## 2022-01-09 DIAGNOSIS — M4312 Spondylolisthesis, cervical region: Secondary | ICD-10-CM | POA: Diagnosis not present

## 2022-01-09 DIAGNOSIS — M25512 Pain in left shoulder: Secondary | ICD-10-CM | POA: Diagnosis not present

## 2022-01-09 DIAGNOSIS — M4310 Spondylolisthesis, site unspecified: Secondary | ICD-10-CM | POA: Insufficient documentation

## 2022-01-09 DIAGNOSIS — M48061 Spinal stenosis, lumbar region without neurogenic claudication: Secondary | ICD-10-CM | POA: Diagnosis not present

## 2022-01-09 DIAGNOSIS — M47816 Spondylosis without myelopathy or radiculopathy, lumbar region: Secondary | ICD-10-CM | POA: Diagnosis not present

## 2022-01-11 LAB — COMPLIANCE DRUG ANALYSIS, UR

## 2022-01-13 DIAGNOSIS — R32 Unspecified urinary incontinence: Secondary | ICD-10-CM | POA: Diagnosis not present

## 2022-01-13 DIAGNOSIS — S22000A Wedge compression fracture of unspecified thoracic vertebra, initial encounter for closed fracture: Secondary | ICD-10-CM | POA: Diagnosis not present

## 2022-01-15 LAB — COMP. METABOLIC PANEL (12)
AST: 21 IU/L (ref 0–40)
Albumin/Globulin Ratio: 1.6 (ref 1.2–2.2)
Albumin: 4.6 g/dL (ref 3.8–4.9)
Alkaline Phosphatase: 137 IU/L — ABNORMAL HIGH (ref 44–121)
BUN/Creatinine Ratio: 16 (ref 9–23)
BUN: 13 mg/dL (ref 6–24)
Bilirubin Total: 0.4 mg/dL (ref 0.0–1.2)
Calcium: 9.9 mg/dL (ref 8.7–10.2)
Chloride: 103 mmol/L (ref 96–106)
Creatinine, Ser: 0.82 mg/dL (ref 0.57–1.00)
Globulin, Total: 2.8 g/dL (ref 1.5–4.5)
Glucose: 123 mg/dL — ABNORMAL HIGH (ref 70–99)
Potassium: 4.9 mmol/L (ref 3.5–5.2)
Sodium: 141 mmol/L (ref 134–144)
Total Protein: 7.4 g/dL (ref 6.0–8.5)
eGFR: 84 mL/min/{1.73_m2} (ref >59)

## 2022-01-15 LAB — VITAMIN B12: Vitamin B-12: 436 pg/mL (ref 232–1245)

## 2022-01-15 LAB — SEDIMENTATION RATE: Sed Rate: 22 mm/hr (ref 0–40)

## 2022-01-15 LAB — MAGNESIUM: Magnesium: 2.1 mg/dL (ref 1.6–2.3)

## 2022-01-15 LAB — 25-HYDROXY VITAMIN D LCMS D2+D3
25-Hydroxy, Vitamin D-2: <1 ng/mL
25-Hydroxy, Vitamin D-3: 35 ng/mL
25-Hydroxy, Vitamin D: 35 ng/mL

## 2022-01-15 LAB — C-REACTIVE PROTEIN: CRP: 2 mg/L (ref 0–10)

## 2022-01-23 ENCOUNTER — Ambulatory Visit
Admission: RE | Admit: 2022-01-23 | Discharge: 2022-01-23 | Disposition: A | Discharge status: Home / Self Care | Payer: Medicaid Other | Source: Ambulatory Visit | Attending: Pain Medicine | Admitting: Pain Medicine

## 2022-01-23 DIAGNOSIS — M4310 Spondylolisthesis, site unspecified: Secondary | ICD-10-CM | POA: Diagnosis present

## 2022-01-23 DIAGNOSIS — S22080S Wedge compression fracture of T11-T12 vertebra, sequela: Secondary | ICD-10-CM | POA: Insufficient documentation

## 2022-01-23 DIAGNOSIS — M542 Cervicalgia: Secondary | ICD-10-CM | POA: Diagnosis not present

## 2022-01-23 DIAGNOSIS — M5030 Other cervical disc degeneration, unspecified cervical region: Secondary | ICD-10-CM

## 2022-01-23 DIAGNOSIS — M549 Dorsalgia, unspecified: Secondary | ICD-10-CM | POA: Diagnosis not present

## 2022-01-23 DIAGNOSIS — M5412 Radiculopathy, cervical region: Secondary | ICD-10-CM | POA: Diagnosis not present

## 2022-01-23 DIAGNOSIS — M5134 Other intervertebral disc degeneration, thoracic region: Secondary | ICD-10-CM | POA: Diagnosis present

## 2022-01-23 DIAGNOSIS — M40204 Unspecified kyphosis, thoracic region: Secondary | ICD-10-CM | POA: Diagnosis not present

## 2022-02-05 ENCOUNTER — Ambulatory Visit: Payer: Self-pay | Admitting: *Deleted

## 2022-02-05 ENCOUNTER — Other Ambulatory Visit: Payer: Self-pay | Admitting: *Deleted

## 2022-02-05 ENCOUNTER — Encounter: Payer: Self-pay | Admitting: Nurse Practitioner

## 2022-02-05 NOTE — Telephone Encounter (Signed)
Reason for Disposition  [1] MODERATE pain (e.g., interferes with normal activities) AND [2] present > 3 days  Answer Assessment - Initial Assessment Questions 1. ONSET: "When did the pain start?"     My hand pain is getting worse.  It's swelling and painful into my wrist.   Left hand.  She put my on Lyrica for the pain it did not help.   2. LOCATION: "Where is the pain located?"     Left thumb 3. PAIN: "How bad is the pain?" (Scale 1-10; or mild, moderate, severe)   - MILD (1-3): doesn't interfere with normal activities   - MODERATE (4-7): interferes with normal activities (e.g., work or school) or awakens from sleep   - SEVERE (8-10): excruciating pain, unable to use hand at all     *No Answer* 4. WORK OR EXERCISE: "Has there been any recent work or exercise that involved this part (i.e., hand or wrist) of the body?"     *No Answer* 5. CAUSE: "What do you think is causing the pain?"     *No Answer* 6. AGGRAVATING FACTORS: "What makes the pain worse?" (e.g., using computer)     *No Answer* 7. OTHER SYMPTOMS: "Do you have any other symptoms?" (e.g., neck pain, swelling, rash, numbness, fever)     *No Answer* 8. PREGNANCY: "Is there any chance you are pregnant?" "When was your last menstrual period?"     *No Answer*  Protocols used: Hand and Wrist Pain-A-AH

## 2022-02-05 NOTE — Patient Instructions (Signed)
Visit Information  Kim [name] was given information about Medicaid Managed Care team care coordination services as a part of their [place] Medicaid benefit. Kim [name] verbally consented to engagement with the [facility] Managed Care team.   If you are experiencing a medical emergency, please call 911 or report to your local emergency department or urgent care.   If you have a non-emergency medical problem during routine business hours, please contact your provider's office and ask to speak with a nurse.   For questions related to your [facility], please call: [phone] or visit the homepage here: [email or web address]  If you would like to schedule transportation through your [facility], please call the following number at least 2 days in advance of your appointment: [phone].  Rides for urgent appointments can also be made after hours by calling Member Services.  Call the [place] at [phone], at any time, 24 hours a day, 7 days a week. If you are in danger or need immediate medical attention call 911.  If you would like help to quit smoking, call 1-800-QUIT-NOW (813)328-3804) OR Espaol: 1-855-Djelo-Ya QO:409462) o para ms informacin haga clic aqu or Text READY to 200-400 to register via text  Kim [name],   Please see education materials related to COPD and carpal tunnel provided by MyChart link.  Patient verbalizes understanding of instructions and care plan provided today and agrees to view in [place]. Active MyChart status and patient understanding of how to access instructions and care plan via MyChart confirmed with patient.     Telephone follow up appointment with Managed Medicaid care management team member scheduled for:03/13/22 @ [place] RN, [place] RN Care  Coordinator   Following is a copy of your plan of care:  Care Plan : RN Care Manager Plan of Care  Updates made by Melissa Montane, RN since 02/05/2022 12:00 AM     Problem: Chronic health management needs related to pain and COPD      Long-Range Goal: Development of Plan of Care to address health management needs related to pain and COPD   Start Date: 08/22/2021  Expected End Date: 03/14/2022  Priority: High  Note:   Current Barriers:  Chronic Disease Management support and education needs related to COPD and pain Kim [name] attended appointment with Pain Management on 01/09/22. She completed labs and imaging studies and will follow up on 02/26/22. She scheduled with [facility] Pulmonology on 02/19/22. She will call to follow up with PCP re: left hand/thumb joint pain.  RNCM Clinical Goal(s):  Patient will verbalize understanding of plan for management of COPD and pain as evidenced by patient verbalization and self monitoring activities take all medications exactly as prescribed and will call provider for medication related questions as evidenced by documentation in EMR    attend all scheduled medical appointments:  Rescheduling with Pain Management and scheduling with [facility] Pulmonology as evidenced by provider documentation in EMR        through collaboration with RN Care manager, provider, and care team.   Interventions: Inter-disciplinary care team collaboration (see longitudinal plan of care) Evaluation of current treatment plan related to  self management and patient's adherence to plan as established by provider  Provided therapeutic listening   COPD: (Status: Goal on Track (progressing): YES.) Long Term Goal  Reviewed medications with patient, including use of prescribed maintenance and rescue inhalers, and provided instruction  on medication management and the importance of adherence Provided patient with basic written and verbal COPD education on self care/management/and exacerbation  prevention Discussed upcoming [facility] Pulmonology appointment/consult on 02/19/22 Discussed smoking cessation, education provided-patient is not ready to work toward smoking cessation  Pain:  (Status: Goal on Track (progressing): YES.) Long Term Goal  Pain assessment performed Medications reviewed Reviewed provider established plan for pain management; Discussed importance of adherence to all scheduled medical appointments; Counseled on the importance of reporting any/all new or changed pain symptoms or management strategies to pain management provider; Reviewed with patient prescribed pharmacological and nonpharmacological pain relief strategies; Assessed social determinant of health barriers;  Reviewed and discussed Pain Management visit    Patient Goals/Self-Care Activities: Take medications as prescribed   Attend all scheduled provider appointments Call pharmacy for medication refills 3-7 days in advance of running out of medications Call provider office for new concerns or questions  - develop a rescue plan - use an extra pillow to sleep - practice relaxation or meditation daily Work on cutting back on smoking

## 2022-02-05 NOTE — Patient Outreach (Signed)
Medicaid Managed Care   Nurse Care Manager Note  02/05/2022 Name:  Kim FretMary Darlean [name] MRN:  [id number] DOB:  08-05-1966  Kim [name] is an 56 y.o. year old female who is a primary patient of Larae GroomsHoldsworth, [name], NP.  [facility] team was consulted for assistance with:    COPD Chronic pain  Kim [name] was given information about Medicaid Managed Care Coordination team services today. Kim [name] Patient agreed to services and verbal consent obtained.  Engaged with patient by telephone for follow up visit in response to provider referral for case management and/or care coordination services.   Assessments/Interventions:  Review of past medical history, allergies, medications, health status, including review of consultants reports, laboratory and other test data, was performed as part of comprehensive evaluation and provision of chronic care management services.  SDOH (Social Determinants of Health) assessments and interventions performed:   Care Plan  Allergies  Allergen Reactions   Penicillins Other (See Comments)    Unknown childhood reaction   Sulfa Antibiotics Swelling    Medications Reviewed Today     Reviewed by Heidi Dachobb, Helder Crisafulli A, RN (Registered Nurse) on 02/05/22 at 1110  Med List Status: <None>   Medication Order Taking? Sig Documenting Provider Last Dose Status Informant  Aspirin-Salicylamide-Caffeine (BC HEADACHE POWDER PO) [id number] Yes Take by mouth. [provider] Taking Active            Med Note (Kierah Goatley A   Thu Nov 14, 2021 10:46 AM) Taking 25 a week  fluticasone (FLONASE) 50 MCG/ACT nasal spray [id number] Yes shake liquid AND INSTILL 1 SPRAY IN EACH NOSTRIL DAILY Larae GroomsHoldsworth, [name], NP Taking Active   ondansetron (ZOFRAN-ODT) 8 MG disintegrating tablet [id number] Yes DISSOLVE 1 TABLET BY MOUTH EVERY 8 HOURS AS NEEDED FOR NAUSEA OR VOMITING Larae GroomsHoldsworth, [name], NP Taking Active   ramelteon (ROZEREM) 8 MG tablet  [id number] Yes Take 8 mg by mouth at bedtime. [provider] Taking Active   rOPINIRole (REQUIP) 4 MG tablet [id number] Yes TAKE 1 TABLET BY MOUTH FIVE TIMES DAILY Larae GroomsHoldsworth, [name], NP Taking Active   rosuvastatin (CRESTOR) 5 MG tablet [id number] No Take 1 tablet (5 mg total) by mouth daily.  Patient not taking: Reported on 02/05/2022   Larae GroomsHoldsworth, [name], NP Not Taking Active   SUMAtriptan 6 MG/0.5ML Ivory BroadSOAJ [id number] Yes INJECT 0.5 ML INTO THE SKIN AT ONSET OF HEADACHE. MAY REPEAT AFTER 1 HOUR IF NO BETTER. MAXIMUM OF 2 DOSES IN 24 HOURS Larae GroomsHoldsworth, [name], NP Taking Active   Med List Note Rickey Barbara(Shatley, Jennifer C, RN 01/08/22 1132): New patient UDS 01/08/2022            Patient Active Problem List   Diagnosis Date Noted   Grade 1 Retrolisthesis of L2/L3 and L3/L4 01/08/2022   Lumbar disc annular tears 01/08/2022   Lumbosacral lateral recess stenosis (L3-4, L5-S1) (Left) 01/08/2022   Chronic hip pain (2ry area of Pain) (Bilateral) (L>R) 01/08/2022   Chronic lower extremity pain (3ry area of Pain) (Intermittent) (Left) 01/08/2022   Chronic thoracic back pain (4th area of Pain) (Midline) (Bilateral) 01/08/2022   Chronic neck and back pain (5th area of Pain) (Midline) 01/08/2022   Cervicalgia 01/08/2022   Cervicogenic headache 01/08/2022   Occipital headache (Bilateral) 01/08/2022   Chronic shoulder pain (6th area of Pain) (Bilateral) 01/08/2022   Carpal tunnel syndrome (Bilateral) 01/08/2022   Numbness and tingling in hands (Bilateral) 01/08/2022   Lumbar facet syndrome (Bilateral) 01/08/2022   Thoracic facet syndrome 01/08/2022  Cervical facet syndrome 01/08/2022   Chronic pain syndrome 12/23/2021   Pharmacologic therapy 12/23/2021   Disorder of skeletal system 12/23/2021   Problems influencing health status 12/23/2021   Abnormal MRI, lumbar spine (10/28/2021) 12/23/2021   Other spondylosis, cervical region (C4/C5 and C5/C6) 12/23/2021   Grade 1 Retrolisthesis of cervical region  (2 mm) (C4/C5) 12/23/2021   DDD (degenerative disc disease), cervical 12/23/2021   DDD (degenerative disc disease), lumbosacral 12/23/2021   DDD (degenerative disc disease), thoracic 12/23/2021   Lumbosacral facet arthropathy (Multilevel) (Bilateral) 12/23/2021   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) (Bilateral: L5-S1) 12/23/2021   Lumbar lateral recess stenosis (L3-4) (Left) 12/23/2021   Stress incontinence 11/01/2021   Chronic low back pain (1ry area of Pain) (Bilateral) (L>R) w/o sciatica 07/04/2021   Upper airway cough syndrome 05/01/2021   Cigarette smoker 05/01/2021   Multiple pulmonary nodules determined by computed tomography of lung 05/01/2021   Rectal bleeding 02/19/2021   History of ileus 02/19/2021   Aortic atherosclerosis (HCC) 01/14/2021   Headache disorder 09/20/2020   Lumbar radiculopathy 08/01/2020   Cervical radiculopathy 08/01/2020   T12 compression fracture, sequela 08/01/2020   PTSD (post-traumatic stress disorder) 04/05/2020   GERD (gastroesophageal reflux disease)    Centrilobular emphysema (HCC) 02/21/2016   RLS (restless legs syndrome) 07/02/2015   Migraines 07/02/2015   COPD GOLD ? / active smoker 06/25/2011   Nicotine dependence, cigarettes, w unsp disorders 06/25/2011    Conditions to be addressed/monitored per PCP order:  COPD and Chronic pain  Care Plan : RN Care Manager Plan of Care  Updates made by Heidi Dach, RN since 02/05/2022 12:00 AM     Problem: Chronic health management needs related to pain and COPD      Long-Range Goal: Development of Plan of Care to address health management needs related to pain and COPD   Start Date: 08/22/2021  Expected End Date: 03/14/2022  Priority: High  Note:   Current Barriers:  Chronic Disease Management support and education needs related to COPD and pain Kim [name] attended appointment with Pain Management on 01/09/22. She completed labs and imaging studies and will follow up on 02/26/22. She  scheduled with [facility] Pulmonology on 02/19/22. She will call to follow up with PCP re: left hand/thumb joint pain.  RNCM Clinical Goal(s):  Patient will verbalize understanding of plan for management of COPD and pain as evidenced by patient verbalization and self monitoring activities take all medications exactly as prescribed and will call provider for medication related questions as evidenced by documentation in EMR    attend all scheduled medical appointments:  Rescheduling with Pain Management and scheduling with [facility] Pulmonology as evidenced by provider documentation in EMR        through collaboration with RN Care manager, provider, and care team.   Interventions: Inter-disciplinary care team collaboration (see longitudinal plan of care) Evaluation of current treatment plan related to  self management and patient's adherence to plan as established by provider  Provided therapeutic listening   COPD: (Status: Goal on Track (progressing): YES.) Long Term Goal  Reviewed medications with patient, including use of prescribed maintenance and rescue inhalers, and provided instruction on medication management and the importance of adherence Provided patient with basic written and verbal COPD education on self care/management/and exacerbation prevention Discussed upcoming [facility] Pulmonology appointment/consult on 02/19/22 Discussed smoking cessation, education provided-patient is not ready to work toward smoking cessation  Pain:  (Status: Goal on Track (progressing): YES.) Long Term Goal  Pain assessment performed Medications  reviewed Reviewed provider established plan for pain management; Discussed importance of adherence to all scheduled medical appointments; Counseled on the importance of reporting any/all new or changed pain symptoms or management strategies to pain management provider; Reviewed with patient prescribed pharmacological and nonpharmacological pain relief strategies; Assessed social  determinant of health barriers;  Reviewed and discussed Pain Management visit    Patient Goals/Self-Care Activities: Take medications as prescribed   Attend all scheduled provider appointments Call pharmacy for medication refills 3-7 days in advance of running out of medications Call provider office for new concerns or questions  - develop a rescue plan - use an extra pillow to sleep - practice relaxation or meditation daily Work on cutting back on smoking       Follow Up:  Patient agrees to Care Plan and Follow-up.  Plan: The Managed Medicaid care management team will reach out to the patient again over the next 30 days.  Date/time of next scheduled RN care management/care coordination outreach:  03/13/22 @ 9am  [name] RN, BSN [place]  [employer]

## 2022-02-05 NOTE — Telephone Encounter (Signed)
  Chief Complaint: Left hand pain.    Symptoms: Pain in thumb and going into hand and into wrist.  Getting hard to use her hand due to pain Frequency:   Most of the time   Lyrica not helping which is why they put me on it. Pertinent Negatives: Patient denies N/A Disposition: [] ED /[] Urgent Care (no appt availability in office) / [x] Appointment(In office/virtual)/ []  [place] [facility]/ [] Home Care/ [] Refused Recommended Disposition /[] [place] [facility]/ []  Follow-up with PCP Additional Notes: Appt made with , PA for 02/06/2022 at 8:20.

## 2022-02-06 ENCOUNTER — Ambulatory Visit
Admission: RE | Admit: 2022-02-06 | Discharge: 2022-02-06 | Disposition: A | Discharge status: Home / Self Care | Payer: Medicaid Other | Source: Ambulatory Visit | Attending: Physician Assistant | Admitting: Physician Assistant

## 2022-02-06 ENCOUNTER — Encounter: Payer: Self-pay | Admitting: Physician Assistant

## 2022-02-06 ENCOUNTER — Ambulatory Visit (INDEPENDENT_AMBULATORY_CARE_PROVIDER_SITE_OTHER): Payer: Medicaid Other | Admitting: Physician Assistant

## 2022-02-06 ENCOUNTER — Ambulatory Visit
Admission: RE | Admit: 2022-02-06 | Discharge: 2022-02-06 | Disposition: A | Discharge status: Home / Self Care | Payer: Medicaid Other | Attending: Physician Assistant | Admitting: Physician Assistant

## 2022-02-06 VITALS — BP 130/83 | HR 89 | Temp 97.7°F | Wt 204.6 lb

## 2022-02-06 DIAGNOSIS — M654 Radial styloid tenosynovitis [de Quervain]: Secondary | ICD-10-CM | POA: Diagnosis not present

## 2022-02-06 DIAGNOSIS — R911 Solitary pulmonary nodule: Secondary | ICD-10-CM

## 2022-02-06 DIAGNOSIS — S298XXA Other specified injuries of thorax, initial encounter: Secondary | ICD-10-CM

## 2022-02-06 DIAGNOSIS — R0781 Pleurodynia: Secondary | ICD-10-CM | POA: Diagnosis not present

## 2022-02-06 DIAGNOSIS — J9811 Atelectasis: Secondary | ICD-10-CM | POA: Diagnosis not present

## 2022-02-06 NOTE — Progress Notes (Signed)
Established Patient Office Visit  Name: Kim [name]   MRN: [id number]    DOB: [birth year]   Date:02/06/2022  Today's Provider: Talitha Givens, MHS, PA-C Introduced myself to the patient as a PA-C and provided education on APPs in clinical practice.         Subjective  Chief Complaint  Chief Complaint  Patient presents with   Hand Pain    Pt states she has been having L hand and wrist pain for the last 2 weeks. States she does not remember injuring herself. Rating pain at 7   Fall    Pt states she had a fall about 4 days ago. States she has been hurting in her side and back, especially when she coughs of laughs. States she has a large bruise and is rating pain at a 10. States the handle of the wheel barrel hit her in her L breast area.     Hand Pain   Fall    Hand and wrist pain: states she has left thumb and wrist pain Reports she does not recall an injury or trauma to the area Pain level: 7/10 aching  Aggravating: working in garden  Alleviating: [employer] gel seems to provide relief     Fall  Reports she fell about 4 days ago and landed against her wheelbarrow States it hit just about her left breast/ pectoral area Reports soreness and tenderness with palpation States she feels some radiation under her arm  Talking and laughing seems to aggravate it  Reports pain with deep inspiration  States lifting her left arm causes pain along her pectoral muscle Admits to frequent coughing from extreme cigarette use - coughing causes increased pain     Patient Active Problem List   Diagnosis Date Noted   Grade 1 Retrolisthesis of L2/L3 and L3/L4 01/08/2022   Lumbar disc annular tears 01/08/2022   Lumbosacral lateral recess stenosis (L3-4, L5-S1) (Left) 01/08/2022   Chronic hip pain (2ry area of Pain) (Bilateral) (L>R) 01/08/2022   Chronic lower extremity pain (3ry area of Pain) (Intermittent) (Left) 01/08/2022   Chronic thoracic back pain (4th area of Pain)  (Midline) (Bilateral) 01/08/2022   Chronic neck and back pain (5th area of Pain) (Midline) 01/08/2022   Cervicalgia 01/08/2022   Cervicogenic headache 01/08/2022   Occipital headache (Bilateral) 01/08/2022   Chronic shoulder pain (6th area of Pain) (Bilateral) 01/08/2022   Carpal tunnel syndrome (Bilateral) 01/08/2022   Numbness and tingling in hands (Bilateral) 01/08/2022   Lumbar facet syndrome (Bilateral) 01/08/2022   Thoracic facet syndrome 01/08/2022   Cervical facet syndrome 01/08/2022   Chronic pain syndrome 12/23/2021   Pharmacologic therapy 12/23/2021   Disorder of skeletal system 12/23/2021   Problems influencing health status 12/23/2021   Abnormal MRI, lumbar spine (10/28/2021) 12/23/2021   Other spondylosis, cervical region (C4/C5 and C5/C6) 12/23/2021   Grade 1 Retrolisthesis of cervical region (2 mm) (C4/C5) 12/23/2021   DDD (degenerative disc disease), cervical 12/23/2021   DDD (degenerative disc disease), lumbosacral 12/23/2021   DDD (degenerative disc disease), thoracic 12/23/2021   Lumbosacral facet arthropathy (Multilevel) (Bilateral) 12/23/2021   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) (Bilateral: L5-S1) 12/23/2021   Lumbar lateral recess stenosis (L3-4) (Left) 12/23/2021   Stress incontinence 11/01/2021   Chronic low back pain (1ry area of Pain) (Bilateral) (L>R) w/o sciatica 07/04/2021   Upper airway cough syndrome 05/01/2021   Cigarette smoker 05/01/2021   Multiple pulmonary nodules determined by computed tomography of lung  05/01/2021   Rectal bleeding 02/19/2021   History of ileus 02/19/2021   Aortic atherosclerosis ([place]) 01/14/2021   Headache disorder 09/20/2020   Lumbar radiculopathy 08/01/2020   Cervical radiculopathy 08/01/2020   T12 compression fracture, sequela 08/01/2020   PTSD (post-traumatic stress disorder) 04/05/2020   GERD (gastroesophageal reflux disease)    Centrilobular emphysema (HCC) 02/21/2016   RLS (restless legs syndrome)  07/02/2015   Migraines 07/02/2015   COPD GOLD ? / active smoker 06/25/2011   Nicotine dependence, cigarettes, w unsp disorders 06/25/2011    Past Surgical History:  Procedure Laterality Date   APPENDECTOMY     breast surgey     CARPAL TUNNEL RELEASE     COLOSTOMY REVISION  2014   TOTAL ABDOMINAL HYSTERECTOMY W/ BILATERAL SALPINGOOPHORECTOMY     endometriosis    Family History  Problem Relation Age of Onset   Alcohol abuse Father    Heart attack Father    Heart disease Brother    Heart attack Brother    Seizures Daughter    Heart attack Maternal Grandmother    Heart attack Paternal Grandmother     Social History   Tobacco Use   Smoking status: Every Day    Packs/day: 4.00    Years: 39.00    Pack years: 156.00    Types: Cigarettes   Smokeless tobacco: Never   Tobacco comments:    3.5-4pdd 04/30/2021  Substance Use Topics   Alcohol use: Yes    Alcohol/week: 42.0 standard drinks    Types: 42 Cans of beer per week     Current Outpatient Medications:    Aspirin-Salicylamide-Caffeine (BC HEADACHE POWDER PO), Take by mouth., Disp: , Rfl:    fluticasone (FLONASE) 50 MCG/ACT nasal spray, shake liquid AND INSTILL 1 SPRAY IN EACH NOSTRIL DAILY, Disp: 16 g, Rfl: 2   ondansetron (ZOFRAN-ODT) 8 MG disintegrating tablet, DISSOLVE 1 TABLET BY MOUTH EVERY 8 HOURS AS NEEDED FOR NAUSEA OR VOMITING, Disp: 90 tablet, Rfl: 2   ramelteon (ROZEREM) 8 MG tablet, Take 8 mg by mouth at bedtime., Disp: , Rfl:    rOPINIRole (REQUIP) 4 MG tablet, TAKE 1 TABLET BY MOUTH FIVE TIMES DAILY, Disp: 450 tablet, Rfl: 1   SUMAtriptan 6 MG/0.5ML SOAJ, INJECT 0.5 ML INTO THE SKIN AT ONSET OF HEADACHE. MAY REPEAT AFTER 1 HOUR IF NO BETTER. MAXIMUM OF 2 DOSES IN 24 HOURS, Disp: 10 mL, Rfl: 1   rosuvastatin (CRESTOR) 5 MG tablet, Take 1 tablet (5 mg total) by mouth daily. (Patient not taking: Reported on 02/05/2022), Disp: 90 tablet, Rfl: 1  Allergies  Allergen Reactions   Penicillins Other (See Comments)     Unknown childhood reaction   Sulfa Antibiotics Swelling     Review of Systems  Respiratory:  Positive for cough and shortness of breath.   Cardiovascular:  Negative for palpitations and leg swelling.  Musculoskeletal:  Positive for joint pain and myalgias.     Objective  Vitals:   02/06/22 0810  BP: 130/83  Pulse: 89  Temp: 97.7 F (36.5 C)  TempSrc: Oral  SpO2: 94%  Weight: 204 lb 9.6 oz (92.8 kg)    Body mass index is 33.02 kg/m.  Physical Exam Vitals reviewed.  Constitutional:      General: She is awake.     Appearance: She is obese.  Cardiovascular:     Rate and Rhythm: Regular rhythm. Tachycardia present.     Pulses: Normal pulses.     Heart sounds: Normal heart sounds.  Pulmonary:  Breath sounds: Decreased air movement present. Examination of the right-upper field reveals rhonchi. Examination of the left-upper field reveals rhonchi. Decreased breath sounds and rhonchi present. No wheezing.     Comments: Rhonchi on auscultation of anterior chest wall  Chest:     Chest wall: Tenderness present.       Comments: Tenderness to palpation over left pectoral muscle and into left axilla   Musculoskeletal:     Right wrist: No snuff box tenderness.     Left wrist: Tenderness and snuff box tenderness present. Decreased range of motion. Normal pulse.     Left hand: Tenderness present. Decreased strength of thumb/finger opposition. Normal pulse.     Comments: Pain with making a fist  Positive Finkelstein test on left side  Phalen's testing resulted in tingling in left thumb and left middle finger   Neurological:     Mental Status: She is alert.  Psychiatric:        Attention and Perception: Attention normal.        Mood and Affect: Mood normal.        Speech: Speech normal.        Behavior: Behavior normal. Behavior is cooperative.     Recent Results (from the past 2160 hour(s))  Comp. Metabolic Panel (12)     Status: Abnormal   Collection Time:  01/08/22 11:30 AM  Result Value Ref Range   Glucose 123 (H) 70 - 99 mg/dL   BUN 13 6 - 24 mg/dL   Creatinine, Ser 0.82 0.57 - 1.00 mg/dL   eGFR 84 >59 mL/min/1.73   BUN/Creatinine Ratio 16 9 - 23   Sodium 141 134 - 144 mmol/L   Potassium 4.9 3.5 - 5.2 mmol/L   Chloride 103 96 - 106 mmol/L   Calcium 9.9 8.7 - 10.2 mg/dL   Total Protein 7.4 6.0 - 8.5 g/dL   Albumin 4.6 3.8 - 4.9 g/dL   Globulin, Total 2.8 1.5 - 4.5 g/dL   Albumin/Globulin Ratio 1.6 1.2 - 2.2   Bilirubin Total 0.4 0.0 - 1.2 mg/dL   Alkaline Phosphatase 137 (H) 44 - 121 IU/L   AST 21 0 - 40 IU/L  Magnesium     Status: None   Collection Time: 01/08/22 11:30 AM  Result Value Ref Range   Magnesium 2.1 1.6 - 2.3 mg/dL  Vitamin B12     Status: None   Collection Time: 01/08/22 11:30 AM  Result Value Ref Range   Vitamin B-12 436 232 - 1,245 pg/mL  Sedimentation rate     Status: None   Collection Time: 01/08/22 11:30 AM  Result Value Ref Range   Sed Rate 22 0 - 40 mm/hr  25-Hydroxy vitamin D Lcms D2+D3     Status: None   Collection Time: 01/08/22 11:30 AM  Result Value Ref Range   25-Hydroxy, Vitamin D 35 ng/mL    Comment: Reference Range: All Ages: Target levels 30 - 100    25-Hydroxy, Vitamin D-2 <1.0 ng/mL    Comment: This test was developed and its performance characteristics determined by [facility]. It has not been cleared or approved by the Food and Drug Administration.    25-Hydroxy, Vitamin D-3 35 ng/mL    Comment: This test was developed and its performance characteristics determined by [facility]. It has not been cleared or approved by the Food and Drug Administration.   C-reactive protein     Status: None   Collection Time: 01/08/22 11:30 AM  Result Value Ref Range  CRP 2 0 - 10 mg/L  Compliance Drug Analysis, Ur     Status: None   Collection Time: 01/08/22  2:57 PM  Result Value Ref Range   Summary Note     Comment: ==================================================================== Compliance  Drug Analysis, Ur ==================================================================== Test                             Result       Flag       Units  Drug Present not Declared for Prescription Verification   Carboxy-THC                    46           UNEXPECTED ng/mg creat    Carboxy-THC is a metabolite of tetrahydrocannabinol (THC). Source of    THC is most commonly herbal marijuana or marijuana-based products,    but THC is also present in a scheduled prescription medication.    Trace amounts of THC can be present in hemp and cannabidiol (CBD)    products. This test is not intended to distinguish between delta-9-    tetrahydrocannabinol, the predominant form of THC in most herbal or    marijuana-based products, and delta-8-tetrahydrocannabinol.  Drug Absent but Declared for Prescription Verification   Salicylate                     Not Detected UNEXPECTED    Salicylate, as indicated in  the declared medication list, is not    always detected even when used as directed.     Aspirin, as indicated in the declared medication list, is not always    detected even when used as directed.  ==================================================================== Test                      Result    Flag   Units      Ref Range   Creatinine              125              mg/dL      >=20 ==================================================================== Declared Medications:  The flagging and interpretation on this report are based on the  following declared medications.  Unexpected results may arise from  inaccuracies in the declared medications.   **Note: The testing scope of this panel does not include small to  moderate amounts of these reported medications:   Aspirin  Salicylate (Salicylamide)   **Note: The testing scope of this panel does not include the  following reported medications:   Caffeine  Fluticasone (Flonase)  Ondansetron (Zofran)  Ramelteon  (Rozerem)  Ropinirole  (Requip)  Rosuvastatin (Crestor)  Sumatriptan ==================================================================== For clinical consultation, please call [phone]. ====================================================================      PHQ2/9:    01/08/2022   10:57 AM 11/01/2021    2:56 PM 01/14/2021    8:12 AM 06/28/2020    9:18 AM 11/04/2019   11:56 AM  Depression screen PHQ 2/9  Decreased Interest 0 0 0 1 0  Down, Depressed, Hopeless 0 0 0 1 0  PHQ - 2 Score 0 0 0 2 0  Altered sleeping  3 0 1 3  Tired, decreased energy  3 0 0 0  Change in appetite  2 0 1 1  Feeling bad or failure about yourself   0 0 1 0  Trouble concentrating  3 0 0 3  Moving slowly or fidgety/restless  0 0 0 0  Suicidal thoughts  0 0 0 0  PHQ-9 Score  11 0 5 7  Difficult doing work/chores  Very difficult Not difficult at all Not difficult at all       Fall Risk:    01/08/2022   10:56 AM 11/01/2021    2:56 PM 01/14/2021    8:12 AM 11/04/2019   11:55 AM  Fall Risk   Falls in the past year? 1 0 1 1  Number falls in past yr: 1 0 0 1  Injury with Fall? 1 0 0 1  Comment    knee and back pain  Risk for fall due to : Impaired balance/gait No Fall Risks    Follow up Falls prevention discussed Falls evaluation completed        Functional Status Survey:      Assessment & Plan   Problem List Items Addressed This Visit   None Visit Diagnoses     Blunt trauma to chest, initial encounter    -  Primary Acute, new problem  Reports she fell against her wheelbarrow and landed against her left breast and chest  States she has pain with inspiration and pain radiates to her left axilla  Will order CXR to evaluate for fracture or underlying trauma Results to dictate further management  Follow up as needed for persistent or progressing symptoms    Relevant Orders   DG Chest 2 View   Tenosynovitis, de Quervain     Acute, new problem Reports pain started a few days ago and is made worse with  activity Recommend using a thumb spicca splint to immobilize thumb and provide wrist stabilization Positive Phalen's sign may indicate carpal tunnel syndrome so will need to monitor for progression Recommend continuation of pain management medications as directed by her pain management provider         No follow-ups on file.   I, Erin E Mecum, PA-C, have reviewed all documentation for this visit. The documentation on 02/06/22 for the exam, diagnosis, procedures, and orders are all accurate and complete.   Talitha Givens, MHS, PA-C [facility]

## 2022-02-06 NOTE — Patient Instructions (Addendum)
For your wrist I recommend the following: Using a thumb spicca splint will help immobilize your thumb and wrist to allow it to recover If the pain and tingling continues please let us know so we can further evaluate for Carpal tunnel Wear the brace as much as you are comfortable with and especially at night Continue with your pain medications as indicated by your pain management provider   For the chest pain I am ordering a chest xray to make sure there isn't an injury to the ribs we need to further manage  We will keep you updated with the results as they are available.

## 2022-02-07 ENCOUNTER — Ambulatory Visit: Payer: Self-pay | Admitting: *Deleted

## 2022-02-07 NOTE — Telephone Encounter (Addendum)
Kim [name] with [name] called in chest x ray report.  Message sent to Kim Grooms, NP Reason for Disposition  [1] Caller requesting NON-URGENT health information AND [2] PCP's office is the best resource  Answer Assessment - Initial Assessment Questions 1. REASON FOR CALL or QUESTION: "What is your reason for calling today?" or "How can I best help you?" or "What question do you have that I can help answer?"     Kim [name] with [name] calling in chest x ray report.   Message sent to Kim Grooms NP.   Report in Epic.  Protocols used: Information Only Call - No Triage-A-AH Called into [facility] and spoke with Kim [name] letting her know of the report.   She will get it to [employer], PA-C and Kim Grooms, NP.

## 2022-02-11 NOTE — Addendum Note (Signed)
Addended by: Jacquelin Hawking on: 02/11/2022 08:29 AM   Modules accepted: Orders

## 2022-02-13 ENCOUNTER — Encounter: Payer: Self-pay | Admitting: Nurse Practitioner

## 2022-02-13 DIAGNOSIS — S22000A Wedge compression fracture of unspecified thoracic vertebra, initial encounter for closed fracture: Secondary | ICD-10-CM | POA: Diagnosis not present

## 2022-02-13 DIAGNOSIS — R32 Unspecified urinary incontinence: Secondary | ICD-10-CM | POA: Diagnosis not present

## 2022-02-17 ENCOUNTER — Ambulatory Visit: Admission: RE | Admit: 2022-02-17 | Payer: Medicaid Other | Source: Ambulatory Visit
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
## 2022-02-26 ENCOUNTER — Ambulatory Visit: Payer: Medicaid Other | Attending: Pain Medicine | Admitting: Pain Medicine

## 2022-02-26 VITALS — BP 114/87 | HR 69 | Temp 97.3°F | Resp 16 | Ht 66.0 in | Wt 204.0 lb

## 2022-02-26 DIAGNOSIS — M25551 Pain in right hip: Secondary | ICD-10-CM | POA: Diagnosis not present

## 2022-02-26 DIAGNOSIS — M542 Cervicalgia: Secondary | ICD-10-CM | POA: Diagnosis not present

## 2022-02-26 DIAGNOSIS — M25512 Pain in left shoulder: Secondary | ICD-10-CM | POA: Diagnosis present

## 2022-02-26 DIAGNOSIS — M545 Low back pain, unspecified: Secondary | ICD-10-CM | POA: Insufficient documentation

## 2022-02-26 DIAGNOSIS — M25552 Pain in left hip: Secondary | ICD-10-CM | POA: Diagnosis present

## 2022-02-26 DIAGNOSIS — R748 Abnormal levels of other serum enzymes: Secondary | ICD-10-CM

## 2022-02-26 DIAGNOSIS — M4310 Spondylolisthesis, site unspecified: Secondary | ICD-10-CM | POA: Insufficient documentation

## 2022-02-26 DIAGNOSIS — M546 Pain in thoracic spine: Secondary | ICD-10-CM | POA: Insufficient documentation

## 2022-02-26 DIAGNOSIS — F1290 Cannabis use, unspecified, uncomplicated: Secondary | ICD-10-CM

## 2022-02-26 DIAGNOSIS — F1291 Cannabis use, unspecified, in remission: Secondary | ICD-10-CM | POA: Insufficient documentation

## 2022-02-26 DIAGNOSIS — R937 Abnormal findings on diagnostic imaging of other parts of musculoskeletal system: Secondary | ICD-10-CM | POA: Insufficient documentation

## 2022-02-26 DIAGNOSIS — M47817 Spondylosis without myelopathy or radiculopathy, lumbosacral region: Secondary | ICD-10-CM

## 2022-02-26 DIAGNOSIS — M549 Dorsalgia, unspecified: Secondary | ICD-10-CM | POA: Insufficient documentation

## 2022-02-26 DIAGNOSIS — R892 Abnormal level of other drugs, medicaments and biological substances in specimens from other organs, systems and tissues: Secondary | ICD-10-CM | POA: Insufficient documentation

## 2022-02-26 DIAGNOSIS — M25511 Pain in right shoulder: Secondary | ICD-10-CM | POA: Diagnosis not present

## 2022-02-26 DIAGNOSIS — G8929 Other chronic pain: Secondary | ICD-10-CM

## 2022-02-26 DIAGNOSIS — M79605 Pain in left leg: Secondary | ICD-10-CM | POA: Diagnosis not present

## 2022-02-26 DIAGNOSIS — M533 Sacrococcygeal disorders, not elsewhere classified: Secondary | ICD-10-CM | POA: Insufficient documentation

## 2022-02-26 DIAGNOSIS — M47816 Spondylosis without myelopathy or radiculopathy, lumbar region: Secondary | ICD-10-CM | POA: Insufficient documentation

## 2022-02-26 DIAGNOSIS — M5137 Other intervertebral disc degeneration, lumbosacral region: Secondary | ICD-10-CM | POA: Diagnosis not present

## 2022-02-26 HISTORY — DX: Abnormal levels of other serum enzymes: R74.8

## 2022-02-26 HISTORY — DX: Abnormal level of other drugs, medicaments and biological substances in specimens from other organs, systems and tissues: R89.2

## 2022-02-26 HISTORY — DX: Spondylosis without myelopathy or radiculopathy, lumbosacral region: M47.817

## 2022-02-26 HISTORY — DX: Cannabis use, unspecified, uncomplicated: F12.90

## 2022-02-26 HISTORY — DX: Abnormal findings on diagnostic imaging of other parts of musculoskeletal system: R93.7

## 2022-02-26 HISTORY — DX: Cannabis use, unspecified, in remission: F12.91

## 2022-02-26 HISTORY — DX: Other chronic pain: G89.29

## 2022-02-26 NOTE — Patient Instructions (Addendum)
______________________________________________________________________  Preparing for Procedure with Sedation  NOTICE: Due to recent regulatory changes, starting on April 15, 2021, procedures requiring intravenous (IV) sedation will no longer be performed at the [facility].  These types of procedures are required to be performed at [facility] ambulatory surgery facility.  We are very sorry for the inconvenience.  Procedure appointments are limited to planned procedures: No Prescription Refills. No disability issues will be discussed. No medication changes will be discussed.  Instructions: Oral Intake: Do not eat or drink anything for at least 8 hours prior to your procedure. (Exception: Blood Pressure Medication. See below.) Transportation: A driver is required. You may not drive yourself after the procedure. Blood Pressure Medicine: Do not forget to take your blood pressure medicine with a sip of water the morning of the procedure. If your Diastolic (lower reading) is above 100 mmHg, elective cases will be cancelled/rescheduled. Blood thinners: These will need to be stopped for procedures. Notify our staff if you are taking any blood thinners. Depending on which one you take, there will be specific instructions on how and when to stop it. Diabetics on insulin: Notify the staff so that you can be scheduled 1st case in the morning. If your diabetes requires high dose insulin, take only  of your normal insulin dose the morning of the procedure and notify the staff that you have done so. Preventing infections: Shower with an antibacterial soap the morning of your procedure. Build-up your immune system: Take 1000 mg of Vitamin C with every meal (3 times a day) the day prior to your procedure. Antibiotics: Inform the staff if you have a condition or reason that requires you to take antibiotics before dental procedures. Pregnancy: If you are pregnant, call and cancel the procedure. Sickness: If  you have a cold, fever, or any active infections, call and cancel the procedure. Arrival: You must be in the facility at least 30 minutes prior to your scheduled procedure. Children: Do not bring children with you. Dress appropriately: There is always the possibility that your clothing may get soiled. Valuables: Do not bring any jewelry or valuables.  Reasons to call and reschedule or cancel your procedure: (Following these recommendations will minimize the risk of a serious complication.) Surgeries: Avoid having procedures within 2 weeks of any surgery. (Avoid for 2 weeks before or after any surgery). Flu Shots: Avoid having procedures within 2 weeks of a flu shots. (Avoid for 2 weeks before or after immunizations). Barium: Avoid having a procedure within 7-10 days after having had a radiological study involving the use of radiological contrast. (Myelograms, Barium swallow or enema study). Heart attacks: Avoid any elective procedures or surgeries for the initial 6 months after a "Myocardial Infarction" (Heart Attack). Blood thinners: It is imperative that you stop these medications before procedures. Let us know if you if you take any blood thinner.  Infection: Avoid procedures during or within two weeks of an infection (including chest colds or gastrointestinal problems). Symptoms associated with infections include: Localized redness, fever, chills, night sweats or profuse sweating, burning sensation when voiding, cough, congestion, stuffiness, runny nose, sore throat, diarrhea, nausea, vomiting, cold or Flu symptoms, recent or current infections. It is specially important if the infection is over the area that we intend to treat. Heart and lung problems: Symptoms that may suggest an active cardiopulmonary problem include: cough, chest pain, breathing difficulties or shortness of breath, dizziness, ankle swelling, uncontrolled high or unusually low blood pressure, and/or palpitations. If you are    experiencing any of these symptoms, cancel your procedure and contact your primary care physician for an evaluation.  Remember:  Regular Business hours are:  Monday to Thursday 8:00 AM to 4:00 PM  Provider's Schedule: Francisco Naveira, MD:  Procedure days: Tuesday and Thursday 7:30 AM to 4:00 PM  Bilal Lateef, MD:  Procedure days: Monday and Wednesday 7:30 AM to 4:00 PM ______________________________________________________________________  ____________________________________________________________________________________________  General Risks and Possible Complications  Patient Responsibilities: It is important that you read this as it is part of your informed consent. It is our duty to inform you of the risks and possible complications associated with treatments offered to you. It is your responsibility as a patient to read this and to ask questions about anything that is not clear or that you believe was not covered in this document.  Patient's Rights: You have the right to refuse treatment. You also have the right to change your mind, even after initially having agreed to have the treatment done. However, under this last option, if you wait until the last second to change your mind, you may be charged for the materials used up to that point.  Introduction: Medicine is not an exact science. Everything in Medicine, including the lack of treatment(s), carries the potential for danger, harm, or loss (which is by definition: Risk). In Medicine, a complication is a secondary problem, condition, or disease that can aggravate an already existing one. All treatments carry the risk of possible complications. The fact that a side effects or complications occurs, does not imply that the treatment was conducted incorrectly. It must be clearly understood that these can happen even when everything is done following the highest safety standards.  No treatment: You can choose not to proceed with the  proposed treatment alternative. The "PRO(s)" would include: avoiding the risk of complications associated with the therapy. The "CON(s)" would include: not getting any of the treatment benefits. These benefits fall under one of three categories: diagnostic; therapeutic; and/or palliative. Diagnostic benefits include: getting information which can ultimately lead to improvement of the disease or symptom(s). Therapeutic benefits are those associated with the successful treatment of the disease. Finally, palliative benefits are those related to the decrease of the primary symptoms, without necessarily curing the condition (example: decreasing the pain from a flare-up of a chronic condition, such as incurable terminal cancer).  General Risks and Complications: These are associated to most interventional treatments. They can occur alone, or in combination. They fall under one of the following six (6) categories: no benefit or worsening of symptoms; bleeding; infection; nerve damage; allergic reactions; and/or death. No benefits or worsening of symptoms: In Medicine there are no guarantees, only probabilities. No healthcare provider can ever guarantee that a medical treatment will work, they can only state the probability that it may. Furthermore, there is always the possibility that the condition may worsen, either directly, or indirectly, as a consequence of the treatment. Bleeding: This is more common if the patient is taking a blood thinner, either prescription or over the counter (example: Goody Powders, Fish oil, Aspirin, Garlic, etc.), or if suffering a condition associated with impaired coagulation (example: Hemophilia, cirrhosis of the liver, low platelet counts, etc.). However, even if you do not have one on these, it can still happen. If you have any of these conditions, or take one of these drugs, make sure to notify your treating physician. Infection: This is more common in patients with a compromised  immune system, either due to disease (example:   diabetes, cancer, human immunodeficiency virus [HIV], etc.), or due to medications or treatments (example: therapies used to treat cancer and rheumatological diseases). However, even if you do not have one on these, it can still happen. If you have any of these conditions, or take one of these drugs, make sure to notify your treating physician. Nerve Damage: This is more common when the treatment is an invasive one, but it can also happen with the use of medications, such as those used in the treatment of cancer. The damage can occur to small secondary nerves, or to large primary ones, such as those in the spinal cord and brain. This damage may be temporary or permanent and it may lead to impairments that can range from temporary numbness to permanent paralysis and/or brain death. Allergic Reactions: Any time a substance or material comes in contact with our body, there is the possibility of an allergic reaction. These can range from a mild skin rash (contact dermatitis) to a severe systemic reaction (anaphylactic reaction), which can result in death. Death: In general, any medical intervention can result in death, most of the time due to an unforeseen complication. ____________________________________________________________________________________________ Facet Blocks Patient Information  Description: The facets are joints in the spine between the vertebrae.  Like any joints in the body, facets can become irritated and painful.  Arthritis can also effect the facets.  By injecting steroids and local anesthetic in and around these joints, we can temporarily block the nerve supply to them.  Steroids act directly on irritated nerves and tissues to reduce selling and inflammation which often leads to decreased pain.  Facet blocks may be done anywhere along the spine from the neck to the low back depending upon the location of your pain.   After numbing the skin  with local anesthetic (like Novocaine), a small needle is passed onto the facet joints under x-ray guidance.  You may experience a sensation of pressure while this is being done.  The entire block usually lasts about 15-25 minutes.   Conditions which may be treated by facet blocks:  Low back/buttock pain Neck/shoulder pain Certain types of headaches  Preparation for the injection:  Do not eat any solid food or dairy products within 8 hours of your appointment. You may drink clear liquid up to 3 hours before appointment.  Clear liquids include water, black coffee, juice or soda.  No milk or cream please. You may take your regular medication, including pain medications, with a sip of water before your appointment.  Diabetics should hold regular insulin (if taken separately) and take 1/2 normal NPH dose the morning of the procedure.  Carry some sugar containing items with you to your appointment. A driver must accompany you and be prepared to drive you home after your procedure. Bring all your current medications with you. An IV may be inserted and sedation may be given at the discretion of the physician. A blood pressure cuff, EKG and other monitors will often be applied during the procedure.  Some patients may need to have extra oxygen administered for a short period. You will be asked to provide medical information, including your allergies and medications, prior to the procedure.  We must know immediately if you are taking blood thinners (like Coumadin/Warfarin) or if you are allergic to IV iodine contrast (dye).  We must know if you could possible be pregnant.  Possible side-effects:  Bleeding from needle site Infection (rare, may require surgery) Nerve injury (rare) Numbness & tingling (temporary) Difficulty   urinating (rare, temporary) Spinal headache (a headache worse with upright posture) Light-headedness (temporary) Pain at injection site (serveral days) Decreased blood pressure  (rare, temporary) Weakness in arm/leg (temporary) Pressure sensation in back/neck (temporary)   Call if you experience:  Fever/chills associated with headache or increased back/neck pain Headache worsened by an upright position New onset, weakness or numbness of an extremity below the injection site Hives or difficulty breathing (go to the emergency room) Inflammation or drainage at the injection site(s) Severe back/neck pain greater than usual New symptoms which are concerning to you  Please note:  Although the local anesthetic injected can often make your back or neck feel good for several hours after the injection, the pain will likely return. It takes 3-7 days for steroids to work.  You may not notice any pain relief for at least one week.  If effective, we will often do a series of 2-3 injections spaced 3-6 weeks apart to maximally decrease your pain.  After the initial series, you may be a candidate for a more permanent nerve block of the facets.  If you have any questions, please call [phone] [facility] 

## 2022-03-13 ENCOUNTER — Other Ambulatory Visit: Payer: Self-pay | Admitting: *Deleted

## 2022-03-13 NOTE — Patient Outreach (Signed)
Care Coordination  03/13/2022  Sosha Shepherd [facility] 1966-08-12 [id number]   Medicaid Managed Care   Unsuccessful Outreach Note  03/13/2022 Name: Kim [name] MRN: [id number] DOB: 1966/03/26  Referred by: Larae Grooms, NP Reason for referral : High Risk Managed Medicaid (Unsuccessful RNCM follow up telephone outreach)   An unsuccessful telephone outreach was attempted today. The patient was referred to the case management team for assistance with care management and care coordination.   Follow Up Plan: A HIPAA compliant phone message was left for the patient providing contact information and requesting a return call.   [name] RN, BSN [place]  [facility]

## 2022-03-13 NOTE — Patient Instructions (Signed)
Visit Information  Ms. Tawney Darlean Tijerina  - as a part of your Medicaid benefit, you are eligible for care management and care coordination services at no cost or copay. I was unable to reach you by phone today but would be happy to help you with your health related needs. Please feel free to call me @ [phone].   A member of the Managed Medicaid care management team will reach out to you again over the next 14 days.   Lachlan Pelto RN, BSN [place]  [facility] RN Care Coordinator   

## 2022-03-14 DIAGNOSIS — R32 Unspecified urinary incontinence: Secondary | ICD-10-CM | POA: Diagnosis not present

## 2022-03-14 DIAGNOSIS — S22000A Wedge compression fracture of unspecified thoracic vertebra, initial encounter for closed fracture: Secondary | ICD-10-CM | POA: Diagnosis not present

## 2022-03-16 DIAGNOSIS — R053 Chronic cough: Secondary | ICD-10-CM

## 2022-03-16 HISTORY — DX: Chronic cough: R05.3

## 2022-03-19 ENCOUNTER — Other Ambulatory Visit: Payer: Self-pay | Admitting: *Deleted

## 2022-03-19 NOTE — Patient Outreach (Signed)
Care Coordination  03/19/2022  Dalina [facility] Aug 24, 1966 [id number]   Medicaid Managed Care   Unsuccessful Outreach Note  03/19/2022 Name: Naquita Nappier MRN: [id number] DOB: 02-20-1966  Referred by: Larae Grooms, NP Reason for referral : High Risk Managed Medicaid (Unsuccessful RNCM follow up telephone outreach, 2nd attempt)   A second unsuccessful telephone outreach was attempted today. The patient was referred to the case management team for assistance with care management and care coordination.   Follow Up Plan: A HIPAA compliant phone message was left for the patient providing contact information and requesting a return call.   [name] RN, BSN [place]  [facility]

## 2022-03-19 NOTE — Patient Instructions (Signed)
Visit Information  [name]  - as a part of your Medicaid benefit, you are eligible for care management and care coordination services at no cost or copay. I was unable to reach you by phone today but would be happy to help you with your health related needs. Please feel free to call me @ [phone].   A member of the Managed Medicaid care management team will reach out to you again over the next 14 days.   [name] RN, BSN [place]  [facility]

## 2022-03-20 ENCOUNTER — Other Ambulatory Visit: Payer: Medicaid Other | Admitting: *Deleted

## 2022-03-20 NOTE — Patient Outreach (Signed)
Care Coordination  03/20/2022  [name] [facility] [birth year] [id number]  Kim [name] was referred to the [facility] Managed Care High Risk team for assistance with care coordination and care management services. Care coordination/care management services as part of the Medicaid benefit was offered to the patient today. The patient declined assistance offered today.   Plan: The Medicaid Managed Care High Risk team is available at any time in the future to assist with care coordination/care management services upon referral.   Kim Emms RN, BSN [place]  [facility] RN Care Coordinator

## 2022-03-20 NOTE — Patient Instructions (Signed)
Kim [name],  Thank you for taking time to speak our team about care coordination and care management services available to you at no cost as part of your Medicaid benefit. These services are voluntary. Our team is available to provide assistance regarding your health care needs at any time. Please do not hesitate to reach out to me if we can be of service to you at any time in the future.   [name] RN, BSN [place]  [facility]

## 2022-03-30 NOTE — Progress Notes (Unsigned)
PROVIDER NOTE: Interpretation of information contained herein should be left to medically-trained personnel. Specific patient instructions are provided elsewhere under "Patient Instructions" section of medical record. This document was created in part using STT-dictation technology, any transcriptional errors that may result from this process are unintentional.  Patient: Kim [name] Type: Established DOB: 04-17-1966 MRN: [id number] PCP: Larae Grooms, NP  Service: Procedure DOS: 04/01/2022 Setting: Ambulatory Location: Ambulatory outpatient facility Delivery: Face-to-face Provider: Oswaldo Done, MD Specialty: Interventional Pain Management Specialty designation: 09 Location: Outpatient facility Ref. Prov.: Delano Metz, MD    Primary Reason for Visit: Interventional Pain Management Treatment. CC: No chief complaint on file.   Procedure:           Type: Lumbar Facet, Medial Branch Block(s) #1  Laterality: Bilateral  Level: L2, L3, L4, L5, & S1 Medial Branch Level(s). Injecting these levels blocks the L3-4, L4-5 and L5-S1 lumbar facet joints. Imaging: Fluoroscopic guidance Anesthesia: Local anesthesia (1-2% Lidocaine) Anxiolysis: IV Versed         Sedation: None. DOS: 04/01/2022 Performed by: Oswaldo Done, MD  Primary Purpose: Diagnostic/Therapeutic Indications: Low back pain severe enough to impact quality of life or function. No diagnosis found. NAS-11 Pain score:   Pre-procedure:  /10   Post-procedure:  /10     Position / Prep / Materials:  Position: Prone  Prep solution: DuraPrep (Iodine Povacrylex [0.7% available iodine] and Isopropyl Alcohol, 74% w/w) Area Prepped: Posterolateral Lumbosacral Spine (Wide prep: From the lower border of the scapula down to the end of the tailbone and from flank to flank.)  Materials:  Tray: Block Needle(s):  Type: Spinal  Gauge (G): 22  Length: 5-in Qty: 4  Pre-op H&P Assessment:  Kim [name] is a 56  y.o. (year old), female patient, seen today for interventional treatment. She  has a past surgical history that includes Appendectomy; Total abdominal hysterectomy w/ bilateral salpingoophorectomy; Colostomy revision (2014); Carpal tunnel release; and breast surgey. Kim [name] has a current medication list which includes the following prescription(s): aspirin-salicylamide-caffeine, fluticasone, ondansetron, ramelteon, ropinirole, rosuvastatin, and sumatriptan. Her primarily concern today is the No chief complaint on file.  Initial Vital Signs:  Pulse/HCG Rate:    Temp:   Resp:   BP:   SpO2:    BMI: Estimated body mass index is 32.93 kg/m as calculated from the following:   Height as of 02/26/22: 5\' 6"  (1.676 m).   Weight as of 02/26/22: 204 lb (92.5 kg).  Risk Assessment: Allergies: Reviewed. She is allergic to penicillins and sulfa antibiotics.  Allergy Precautions: None required Coagulopathies: Reviewed. None identified.  Blood-thinner therapy: None at this time Active Infection(s): Reviewed. None identified. Kim [name] is afebrile  Site Confirmation: Kim [name] was asked to confirm the procedure and laterality before marking the site Procedure checklist: Completed Consent: Before the procedure and under the influence of no sedative(s), amnesic(s), or anxiolytics, the patient was informed of the treatment options, risks and possible complications. To fulfill our ethical and legal obligations, as recommended by the American Medical Association's Code of Ethics, I have informed the patient of my clinical impression; the nature and purpose of the treatment or procedure; the risks, benefits, and possible complications of the intervention; the alternatives, including doing nothing; the risk(s) and benefit(s) of the alternative treatment(s) or procedure(s); and the risk(s) and benefit(s) of doing nothing. The patient was provided information about the general risks and possible complications  associated with the procedure. These may include, but are not limited to: failure to  achieve desired goals, infection, bleeding, organ or nerve damage, allergic reactions, paralysis, and death. In addition, the patient was informed of those risks and complications associated to Spine-related procedures, such as failure to decrease pain; infection (i.e.: Meningitis, epidural or intraspinal abscess); bleeding (i.e.: epidural hematoma, subarachnoid hemorrhage, or any other type of intraspinal or peri-dural bleeding); organ or nerve damage (i.e.: Any type of peripheral nerve, nerve root, or spinal cord injury) with subsequent damage to sensory, motor, and/or autonomic systems, resulting in permanent pain, numbness, and/or weakness of one or several areas of the body; allergic reactions; (i.e.: anaphylactic reaction); and/or death. Furthermore, the patient was informed of those risks and complications associated with the medications. These include, but are not limited to: allergic reactions (i.e.: anaphylactic or anaphylactoid reaction(s)); adrenal axis suppression; blood sugar elevation that in diabetics may result in ketoacidosis or comma; water retention that in patients with history of congestive heart failure may result in shortness of breath, pulmonary edema, and decompensation with resultant heart failure; weight gain; swelling or edema; medication-induced neural toxicity; particulate matter embolism and blood vessel occlusion with resultant organ, and/or nervous system infarction; and/or aseptic necrosis of one or more joints. Finally, the patient was informed that Medicine is not an exact science; therefore, there is also the possibility of unforeseen or unpredictable risks and/or possible complications that may result in a catastrophic outcome. The patient indicated having understood very clearly. We have given the patient no guarantees and we have made no promises. Enough time was given to the patient to  ask questions, all of which were answered to the patient's satisfaction. Kim [name] has indicated that she wanted to continue with the procedure. Attestation: I, the ordering provider, attest that I have discussed with the patient the benefits, risks, side-effects, alternatives, likelihood of achieving goals, and potential problems during recovery for the procedure that I have provided informed consent. Date  Time: {CHL [facility]-PAIN TIME CHOICES:21018001}  Pre-Procedure Preparation:  Monitoring: As per clinic protocol. Respiration, ETCO2, SpO2, BP, heart rate and rhythm monitor placed and checked for adequate function Safety Precautions: Patient was assessed for positional comfort and pressure points before starting the procedure. Time-out: I initiated and conducted the "Time-out" before starting the procedure, as per protocol. The patient was asked to participate by confirming the accuracy of the "Time Out" information. Verification of the correct person, site, and procedure were performed and confirmed by me, the nursing staff, and the patient. "Time-out" conducted as per Joint Commission's Universal Protocol (UP.01.01.01). Time:    Description of Procedure:          Laterality: Bilateral. The procedure was performed in identical fashion on both sides. Targeted Levels:  L2, L3, L4, L5, & S1 Medial Branch Level(s)  Safety Precautions: Aspiration looking for blood return was conducted prior to all injections. At no point did we inject any substances, as a needle was being advanced. Before injecting, the patient was told to immediately notify me if she was experiencing any new onset of "ringing in the ears, or metallic taste in the mouth". No attempts were made at seeking any paresthesias. Safe injection practices and needle disposal techniques used. Medications properly checked for expiration dates. SDV (single dose vial) medications used. After the completion of the procedure, all disposable equipment  used was discarded in the proper designated medical waste containers. Local Anesthesia: Protocol guidelines were followed. The patient was positioned over the fluoroscopy table. The area was prepped in the usual manner. The time-out was completed. The target area  was identified using fluoroscopy. A 12-in long, straight, sterile hemostat was used with fluoroscopic guidance to locate the targets for each level blocked. Once located, the skin was marked with an approved surgical skin marker. Once all sites were marked, the skin (epidermis, dermis, and hypodermis), as well as deeper tissues (fat, connective tissue and muscle) were infiltrated with a small amount of a short-acting local anesthetic, loaded on a 10cc syringe with a 25G, 1.5-in  Needle. An appropriate amount of time was allowed for local anesthetics to take effect before proceeding to the next step. Local Anesthetic: Lidocaine 2.0% The unused portion of the local anesthetic was discarded in the proper designated containers. Technical description of process:  L2 Medial Branch Nerve Block (MBB): The target area for the L2 medial branch is at the junction of the postero-lateral aspect of the superior articular process and the superior, posterior, and medial edge of the transverse process of L3. Under fluoroscopic guidance, a Quincke needle was inserted until contact was made with os over the superior postero-lateral aspect of the pedicular shadow (target area). After negative aspiration for blood, 0.5 mL of the nerve block solution was injected without difficulty or complication. The needle was removed intact. L3 Medial Branch Nerve Block (MBB): The target area for the L3 medial branch is at the junction of the postero-lateral aspect of the superior articular process and the superior, posterior, and medial edge of the transverse process of L4. Under fluoroscopic guidance, a Quincke needle was inserted until contact was made with os over the superior  postero-lateral aspect of the pedicular shadow (target area). After negative aspiration for blood, 0.5 mL of the nerve block solution was injected without difficulty or complication. The needle was removed intact. L4 Medial Branch Nerve Block (MBB): The target area for the L4 medial branch is at the junction of the postero-lateral aspect of the superior articular process and the superior, posterior, and medial edge of the transverse process of L5. Under fluoroscopic guidance, a Quincke needle was inserted until contact was made with os over the superior postero-lateral aspect of the pedicular shadow (target area). After negative aspiration for blood, 0.5 mL of the nerve block solution was injected without difficulty or complication. The needle was removed intact. L5 Medial Branch Nerve Block (MBB): The target area for the L5 medial branch is at the junction of the postero-lateral aspect of the superior articular process and the superior, posterior, and medial edge of the sacral ala. Under fluoroscopic guidance, a Quincke needle was inserted until contact was made with os over the superior postero-lateral aspect of the pedicular shadow (target area). After negative aspiration for blood, 0.5 mL of the nerve block solution was injected without difficulty or complication. The needle was removed intact. S1 Medial Branch Nerve Block (MBB): The target area for the S1 medial branch is at the posterior and inferior 6 o'clock position of the L5-S1 facet joint. Under fluoroscopic guidance, the Quincke needle inserted for the L5 MBB was redirected until contact was made with os over the inferior and postero aspect of the sacrum, at the 6 o' clock position under the L5-S1 facet joint (Target area). After negative aspiration for blood, 0.5 mL of the nerve block solution was injected without difficulty or complication. The needle was removed intact.  Once the entire procedure was completed, the treated area was cleaned,  making sure to leave some of the prepping solution back to take advantage of its long term bactericidal properties.  Illustration of the posterior view of the lumbar spine and the posterior neural structures. Laminae of L2 through S1 are labeled. DPRL5, dorsal primary ramus of L5; DPRS1, dorsal primary ramus of S1; DPR3, dorsal primary ramus of L3; FJ, facet (zygapophyseal) joint L3-L4; I, inferior articular process of L4; LB1, lateral branch of dorsal primary ramus of L1; IAB, inferior articular branches from L3 medial branch (supplies L4-L5 facet joint); IBP, intermediate branch plexus; MB3, medial branch of dorsal primary ramus of L3; NR3, third lumbar nerve root; S, superior articular process of L5; SAB, superior articular branches from L4 (supplies L4-5 facet joint also); TP3, transverse process of L3.  There were no vitals filed for this visit.   Start Time:   hrs. End Time:   hrs.  Imaging Guidance (Spinal):          Type of Imaging Technique: Fluoroscopy Guidance (Spinal) Indication(s): Assistance in needle guidance and placement for procedures requiring needle placement in or near specific anatomical locations not easily accessible without such assistance. Exposure Time: Please see nurses notes. Contrast: None used. Fluoroscopic Guidance: I was personally present during the use of fluoroscopy. "Tunnel Vision Technique" used to obtain the best possible view of the target area. Parallax error corrected before commencing the procedure. "Direction-depth-direction" technique used to introduce the needle under continuous pulsed fluoroscopy. Once target was reached, antero-posterior, oblique, and lateral fluoroscopic projection used confirm needle placement in all planes. Images permanently stored in EMR. Interpretation: No contrast injected. I personally interpreted the imaging intraoperatively. Adequate needle placement confirmed in multiple planes. Permanent images saved into the  patient's record.  Antibiotic Prophylaxis:   Anti-infectives (From admission, onward)    None      Indication(s): None identified  Post-operative Assessment:  Post-procedure Vital Signs:  Pulse/HCG Rate:    Temp:   Resp:   BP:   SpO2:    EBL: None  Complications: No immediate post-treatment complications observed by team, or reported by patient.  Note: The patient tolerated the entire procedure well. A repeat set of vitals were taken after the procedure and the patient was kept under observation following institutional policy, for this type of procedure. Post-procedural neurological assessment was performed, showing return to baseline, prior to discharge. The patient was provided with post-procedure discharge instructions, including a section on how to identify potential problems. Should any problems arise concerning this procedure, the patient was given instructions to immediately contact us, at any time, without hesitation. In any case, we plan to contact the patient by telephone for a follow-up status report regarding this interventional procedure.  Comments:  No additional relevant information.  Plan of Care  Orders:  No orders of the defined types were placed in this encounter.  Chronic Opioid Analgesic:  None MME/day: 0 mg/day   Medications ordered for procedure: No orders of the defined types were placed in this encounter.  Medications administered: Kim [name]. Kim "Darlean" had no medications administered during this visit.  See the medical record for exact dosing, route, and time of administration.  Follow-up plan:   No follow-ups on file.       Interventional Therapies  Risk  Complexity Considerations:   Estimated body mass index is 34.06 kg/m as calculated from the following:   Height as of this encounter: 5\' 6"  (1.676 m).   Weight as of this encounter: 211 lb (95.7 kg). WNL   Planned  Pending:   Request again procedure notes from [facility] in  [facility] Diagnostic bilateral lumbar facet MBB #1  Under consideration:   Diagnostic bilateral lumbar facet MBB #1  Diagnostic left L4-5 LESI #1  Diagnostic left L5 and S1 TFESI #1  Diagnostic bilateral SI joint Blk #1  Diagnostic bilateral IA hip joint inj. #1  Diagnostic bilateral thoracic facet MBB #1  Diagnostic bilateral cervical facet MBB #1  Diagnostic bilateral IA shoulder joint inj. #1  Diagnostic bilateral carpal tunnel syndrome inj. #1   Completed:   None at this time   Completed by other providers:   Nerve block by [facility] in [facility]   Therapeutic  Palliative (PRN) options:   None established     Recent Visits Date Type Provider Dept  02/26/22 Office Visit Delano Metz, MD [facility]  01/08/22 Office Visit Delano Metz, MD [facility]  Showing recent visits within past 90 days and meeting all other requirements Future Appointments Date Type Provider Dept  04/01/22 Appointment Delano Metz, MD [facility]  Showing future appointments within next 90 days and meeting all other requirements  Disposition: Discharge home  Discharge (Date  Time): 04/01/2022;   hrs.   Primary Care Physician: Larae Grooms, NP Location: [facility] Note by: Oswaldo Done, MD Date: 04/01/2022; Time: 3:07 PM  Disclaimer:  Medicine is not an Visual merchandiser. The only guarantee in medicine is that nothing is guaranteed. It is important to note that the decision to proceed with this intervention was based on the information collected from the patient. The Data and conclusions were drawn from the patient's questionnaire, the interview, and the physical examination. Because the information was provided in large part by the patient, it cannot be guaranteed that it has not been purposely or unconsciously manipulated. Every effort has been made to obtain as much relevant data as possible for this evaluation. It  is important to note that the conclusions that lead to this procedure are derived in large part from the available data. Always take into account that the treatment will also be dependent on availability of resources and existing treatment guidelines, considered by other Pain Management Practitioners as being common knowledge and practice, at the time of the intervention. For Medico-Legal purposes, it is also important to point out that variation in procedural techniques and pharmacological choices are the acceptable norm. The indications, contraindications, technique, and results of the above procedure should only be interpreted and judged by a Board-Certified Interventional Pain Specialist with extensive familiarity and expertise in the same exact procedure and technique.

## 2022-04-01 ENCOUNTER — Encounter: Payer: Self-pay | Admitting: Pain Medicine

## 2022-04-01 ENCOUNTER — Ambulatory Visit: Payer: Medicaid Other | Attending: Pain Medicine | Admitting: Pain Medicine

## 2022-04-01 ENCOUNTER — Ambulatory Visit
Admission: RE | Admit: 2022-04-01 | Discharge: 2022-04-01 | Disposition: A | Discharge status: Home / Self Care | Payer: Medicaid Other | Source: Ambulatory Visit | Attending: Pain Medicine | Admitting: Pain Medicine

## 2022-04-01 VITALS — BP 121/63 | HR 104 | Temp 97.0°F | Resp 16 | Ht 66.0 in | Wt 204.0 lb

## 2022-04-01 DIAGNOSIS — M545 Low back pain, unspecified: Secondary | ICD-10-CM | POA: Diagnosis present

## 2022-04-01 DIAGNOSIS — G8929 Other chronic pain: Secondary | ICD-10-CM | POA: Diagnosis present

## 2022-04-01 DIAGNOSIS — M4310 Spondylolisthesis, site unspecified: Secondary | ICD-10-CM | POA: Insufficient documentation

## 2022-04-01 DIAGNOSIS — M5137 Other intervertebral disc degeneration, lumbosacral region: Secondary | ICD-10-CM | POA: Diagnosis present

## 2022-04-01 DIAGNOSIS — M47816 Spondylosis without myelopathy or radiculopathy, lumbar region: Secondary | ICD-10-CM

## 2022-04-01 DIAGNOSIS — M47817 Spondylosis without myelopathy or radiculopathy, lumbosacral region: Secondary | ICD-10-CM | POA: Insufficient documentation

## 2022-04-01 MED ORDER — LIDOCAINE HCL 2 % IJ SOLN
20.0000 mL | Freq: Once | INTRAMUSCULAR | Status: AC
  Administered 2022-04-01: 400 mg

## 2022-04-01 MED ORDER — LIDOCAINE HCL 2 % IJ SOLN
INTRAMUSCULAR | Status: AC
  Filled 2022-04-01: qty 20

## 2022-04-01 MED ORDER — TRIAMCINOLONE ACETONIDE 40 MG/ML IJ SUSP
80.0000 mg | Freq: Once | INTRAMUSCULAR | Status: AC
  Administered 2022-04-01: 80 mg

## 2022-04-01 MED ORDER — MIDAZOLAM HCL 5 MG/5ML IJ SOLN
0.5000 mg | Freq: Once | INTRAMUSCULAR | Status: AC
  Administered 2022-04-01: 4 mg via INTRAVENOUS

## 2022-04-01 MED ORDER — MIDAZOLAM HCL 5 MG/5ML IJ SOLN
INTRAMUSCULAR | Status: AC
  Filled 2022-04-01: qty 5

## 2022-04-01 MED ORDER — LACTATED RINGERS IV SOLN: Freq: Once | INTRAVENOUS | Status: AC

## 2022-04-01 MED ORDER — ROPIVACAINE HCL 2 MG/ML IJ SOLN
18.0000 mL | Freq: Once | INTRAMUSCULAR | Status: AC
  Administered 2022-04-01: 18 mL via PERINEURAL

## 2022-04-01 MED ORDER — FENTANYL CITRATE (PF) 100 MCG/2ML IJ SOLN
INTRAMUSCULAR | Status: AC
  Filled 2022-04-01: qty 2

## 2022-04-01 MED ORDER — PENTAFLUOROPROP-TETRAFLUOROETH EX AERO: INHALATION_SPRAY | Freq: Once | CUTANEOUS | Status: DC

## 2022-04-01 MED ORDER — ROPIVACAINE HCL 2 MG/ML IJ SOLN
INTRAMUSCULAR | Status: AC
  Filled 2022-04-01: qty 20

## 2022-04-01 MED ORDER — TRIAMCINOLONE ACETONIDE 40 MG/ML IJ SUSP
INTRAMUSCULAR | Status: AC
  Filled 2022-04-01: qty 2

## 2022-04-01 MED ORDER — FENTANYL CITRATE (PF) 100 MCG/2ML IJ SOLN
25.0000 ug | INTRAMUSCULAR | Status: DC | PRN
  Administered 2022-04-01: 50 ug via INTRAVENOUS

## 2022-04-01 NOTE — Progress Notes (Signed)
Safety precautions to be maintained throughout the outpatient stay will include: orient to surroundings, keep bed in low position, maintain call bell within reach at all times, provide assistance with transfer out of bed and ambulation.  

## 2022-04-01 NOTE — Patient Instructions (Signed)

## 2022-04-02 ENCOUNTER — Telehealth: Payer: Self-pay

## 2022-04-02 NOTE — Telephone Encounter (Signed)
Post procedure phone call.  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

## 2022-04-03 DIAGNOSIS — S22000A Wedge compression fracture of unspecified thoracic vertebra, initial encounter for closed fracture: Secondary | ICD-10-CM | POA: Diagnosis not present

## 2022-04-03 DIAGNOSIS — R32 Unspecified urinary incontinence: Secondary | ICD-10-CM | POA: Diagnosis not present

## 2022-04-16 NOTE — Progress Notes (Deleted)
PROVIDER NOTE: Information contained herein reflects review and annotations entered in association with encounter. Interpretation of such information and data should be left to medically-trained personnel. Information provided to patient can be located elsewhere in the medical record under "Patient Instructions". Document created using STT-dictation technology, any transcriptional errors that may result from process are unintentional.    Patient: Kim [name]  Service Category: E/M  Provider: Gaspar Cola, MD  DOB: 1965-11-02  DOS: 04/17/2022  Referring Provider: [name], NP  MRN: [id number]  Specialty: Interventional Pain Management  PCP: [name], NP  Type: Established Patient  Setting: Ambulatory outpatient    Location: Office  Delivery: Face-to-face     HPI  Kim [name], a 56 y.o. year old female, is here today because of her Chronic bilateral low back pain without sciatica [M54.50, G89.29]. Kim [name] primary complain today is No chief complaint on file. Last encounter: My last encounter with her was on 04/01/2022. Pertinent problems: 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) (Bilateral: L5-S1); Lumbar lateral recess stenosis (L3-4) (Left); Grade 1 Retrolisthesis of L2/L3 and L3/L4; Lumbar disc annular tears; Lumbosacral lateral recess stenosis (L3-4, L5-S1) (Left); Chronic  hip pain (2ry area of Pain) (Bilateral) (L>R); Chronic lower extremity pain (3ry area of Pain) (Intermittent) (Left); Chronic thoracic back pain (4th area of Pain) (Midline) (Bilateral); Chronic neck and back pain (5th area of Pain) (Midline); Cervicalgia; Cervicogenic headache; Occipital headache (Bilateral); Chronic shoulder pain (6th area of Pain) (Bilateral); Carpal tunnel syndrome (Bilateral); Numbness and tingling in hands (Bilateral); Lumbar facet syndrome (Bilateral); Thoracic facet syndrome; Cervical facet syndrome; Spondylosis without myelopathy or radiculopathy, lumbosacral region; Chronic sacroiliac joint pain (Bilateral); Abnormal MRI, cervical spine (01/23/2022); and Abnormal MRI, thoracic spine (01/23/2022) on their pertinent problem list. Pain Assessment: Severity of   is reported as a  /10. Location:    / . Onset:  . Quality:  . Timing:  . Modifying factor(s):  Marland Kitchen Vitals:  vitals were not taken for this visit.   Reason for encounter: post-procedure evaluation and assessment. ***  Post-procedure evaluation   Type: Lumbar Facet, Medial Branch Block(s) #1  Laterality: Bilateral  Level: L2, L3, L4, L5, & S1 Medial Branch Level(s). Injecting these levels blocks the L3-4, L4-5 and L5-S1 lumbar facet joints. Imaging: Fluoroscopic guidance Anesthesia: Local anesthesia (1-2% Lidocaine) Anxiolysis: IV Versed 4.0 mg (High tolerance and very anxious) Sedation: Moderate conscious sedation. (Fentanyl 6m) DOS: 04/01/2022 Performed by: FGaspar Cola MD  Primary Purpose: Diagnostic/Therapeutic Indications: Low back pain severe enough to impact quality of life or function. 1. Lumbar facet syndrome (Bilateral)   2. Spondylosis without myelopathy or radiculopathy, lumbosacral region   3. Grade 1 Retrolisthesis of L2/L3 and L3/L4   4. DDD (degenerative disc disease), lumbosacral   5. Chronic low back pain (1ry area of Pain) (Bilateral) (L>R) w/o sciatica   6. Lumbosacral facet arthropathy  (Multilevel) (Bilateral)    NAS-11 Pain score:   Pre-procedure: 6 /10   Post-procedure: 0-No pain/10      Effectiveness:  Initial hour after procedure:   ***. Subsequent  4-6 hours post-procedure:   ***. Analgesia past initial 6 hours:   ***. Ongoing improvement:  Analgesic:  *** Function:    ***    ROM:    ***     Pharmacotherapy Assessment  Analgesic: None MME/day: 0 mg/day   Monitoring: [place] PMP: PDMP reviewed during this encounter.       Pharmacotherapy: No side-effects or adverse reactions reported. Compliance: No problems identified. Effectiveness: Clinically acceptable.  No notes on file  UDS:  Summary  Date Value Ref Range Status  01/08/2022 Note  Final    Comment:    ==================================================================== Compliance Drug Analysis, Ur ==================================================================== Test                             Result       Flag       Units  Drug Present not Declared for Prescription Verification   Carboxy-THC                    46           UNEXPECTED ng/mg creat    Carboxy-THC is a metabolite of tetrahydrocannabinol (THC). Source of    THC is most commonly herbal marijuana or marijuana-based products,    but THC is also present in a scheduled prescription medication.    Trace amounts of THC can be present in hemp and cannabidiol (CBD)    products. This test is not intended to distinguish between delta-9-    tetrahydrocannabinol, the predominant form of THC in most herbal or    marijuana-based products, and delta-8-tetrahydrocannabinol.  Drug Absent but Declared for Prescription Verification   Salicylate                     Not Detected UNEXPECTED    Salicylate, as indicated in the declared medication list, is not    always detected even when used as directed.     Aspirin, as indicated in the declared medication list, is not always    detected even when used as  directed.  ==================================================================== Test                      Result    Flag   Units      Ref Range   Creatinine              125              mg/dL      >=20 ==================================================================== Declared Medications:  The flagging and interpretation on this report are based on the  following declared medications.  Unexpected results may arise from  inaccuracies in the declared medications.   **Note: The testing scope of this panel does not include small to  moderate amounts of these reported medications:   Aspirin  Salicylate (Salicylamide)   **Note: The testing scope of this panel does not include the  following reported medications:   Caffeine  Fluticasone (Flonase)  Ondansetron (Zofran)  Ramelteon (Rozerem)  Ropinirole (Requip)  Rosuvastatin (Crestor)  Sumatriptan ==================================================================== For clinical consultation, please call [phone]. ====================================================================      ROS  Constitutional: Denies any fever or chills Gastrointestinal: No reported hemesis, hematochezia, vomiting, or acute GI distress Musculoskeletal: Denies any acute onset joint swelling, redness, loss of ROM, or weakness Neurological: No reported episodes of acute onset apraxia, aphasia, dysarthria, agnosia, amnesia, paralysis, loss of coordination, or loss of consciousness  Medication Review  Aspirin-Salicylamide-Caffeine, SUMAtriptan, fluticasone, ondansetron, rOPINIRole, ramelteon, and rosuvastatin  History Review  Allergy: Kim [name] is allergic to penicillins and sulfa antibiotics. Drug: Kim [name]  reports that she does not currently use drugs after having used the following drugs: Marijuana. Alcohol:  reports current alcohol use of about 42.0 standard drinks of alcohol per week. Tobacco:  reports that she has been smoking  cigarettes. She has a 156.00 pack-year smoking history. She has never used smokeless tobacco. Social: Kim [name]  reports that she has been smoking cigarettes. She has a 156.00 pack-year smoking history. She has never used smokeless tobacco. She reports current alcohol use of about 42.0 standard drinks of alcohol per week. She reports that she does not currently use drugs after having used the following drugs: Marijuana. Medical:  has a past medical history of Arthritis, Bipolar disorder ([place]), COPD (chronic obstructive pulmonary disease) ([place]), GERD (gastroesophageal reflux disease), Leg pain, Migraine, and Restless leg syndrome. Surgical: Kim [name]  has a past surgical history that includes Appendectomy; Total abdominal hysterectomy w/ bilateral salpingoophorectomy; Colostomy revision (2014); Carpal tunnel release; and breast surgey. Family: family history includes Alcohol abuse in her father; Heart attack in her brother, father, maternal grandmother, and paternal grandmother; Heart disease in her brother; Seizures in her daughter.  Laboratory Chemistry Profile   Renal Lab Results  Component Value Date   BUN 13 01/08/2022   CREATININE 0.82 01/08/2022   BCR 16 01/08/2022   GFRAA >60 12/06/2015   GFRNONAA >60 12/06/2015    Hepatic Lab Results  Component Value Date   AST 21 01/08/2022   ALT 23 11/01/2021   ALBUMIN 4.6 01/08/2022   ALKPHOS 137 (H) 01/08/2022   LIPASE 24 12/06/2015    Electrolytes Lab Results  Component Value Date   NA 141 01/08/2022   K 4.9 01/08/2022   CL 103 01/08/2022   CALCIUM 9.9 01/08/2022   MG 2.1 01/08/2022   PHOS 4.4 10/05/2012    Bone Lab Results  Component Value Date   25OHVITD1 35 01/08/2022   25OHVITD2 <1.0 01/08/2022   25OHVITD3 35 01/08/2022    Inflammation (CRP: Acute Phase) (ESR: Chronic Phase) Lab Results  Component Value Date   CRP 2 01/08/2022   ESRSEDRATE 22 01/08/2022         Note: Above Lab results reviewed.  Recent Imaging  Review  DG PAIN CLINIC C-ARM 1-60 MIN NO REPORT Fluoro was used, but no Radiologist interpretation will be provided.  Please refer to "NOTES" tab for provider progress note. Note: Reviewed        Physical Exam  General appearance: Well nourished, well developed, and well hydrated. In no apparent acute distress Mental status: Alert, oriented x 3 (person, place, & time)       Respiratory: No evidence of acute respiratory distress Eyes: PERLA Vitals: There were no vitals taken for this visit. BMI: Estimated body mass index is 32.93 kg/m as calculated from the following:   Height as of 04/01/22: _0  (1.676 m).   Weight as of 04/01/22: 204 lb (92.5 kg). Ideal: Patient weight not recorded  Assessment   Diagnosis Status  1. Chronic low back pain (1ry area of Pain) (Bilateral) (L>R) w/o sciatica   2. Chronic lower extremity pain (3ry area of Pain) (Intermittent) (Left)   3. Lumbar facet syndrome (Bilateral)   4. Spondylosis without myelopathy or radiculopathy, lumbosacral region   5. Grade 1 Retrolisthesis of L2/L3 and L3/L4   6. Lumbosacral facet arthropathy (Multilevel) (Bilateral)  7. DDD (degenerative disc disease), lumbosacral   8. Chronic hip pain (2ry area of Pain) (Bilateral) (L>R)    Controlled Controlled Controlled   Updated Problems: No problems updated.  Plan of Care  Problem-specific:  No problem-specific Assessment & Plan notes found for this encounter.  Kim [name] has a current medication list which includes the following long-term medication(s): fluticasone, ramelteon, ropinirole, rosuvastatin, and sumatriptan.  Pharmacotherapy (Medications Ordered): No orders of the defined types were placed in this encounter.  Orders:  No orders of the defined types were placed in this encounter.  Follow-up plan:   No follow-ups on file.     Interventional Therapies  Risk  Complexity Considerations:   Estimated body mass index is 34.06 kg/m as calculated  from the following:   Height as of this encounter: _0  (1.676 m).   Weight as of this encounter: 211 lb (95.7 kg). WNL   Planned  Pending:   Request again procedure notes from [facility] in [place] Diagnostic bilateral lumbar facet MBB #1    Under consideration:   Diagnostic bilateral lumbar facet MBB #1  Diagnostic left L4-5 LESI #1  Diagnostic left L5 and S1 TFESI #1  Diagnostic bilateral SI joint Blk #1  Diagnostic bilateral IA hip joint inj. #1  Diagnostic bilateral thoracic facet MBB #1  Diagnostic bilateral cervical facet MBB #1  Diagnostic bilateral IA shoulder joint inj. #1  Diagnostic bilateral carpal tunnel syndrome inj. #1   Completed:   None at this time   Completed by other providers:   Nerve block by [facility] in [facility]   Therapeutic  Palliative (PRN) options:   None established      Recent Visits Date Type Provider Dept  04/01/22 Procedure visit Milinda Pointer, MD [facility]  02/26/22 Office Visit Milinda Pointer, MD [facility]  Showing recent visits within past 90 days and meeting all other requirements Future Appointments Date Type Provider Dept  04/17/22 Appointment Milinda Pointer, MD [facility]  Showing future appointments within next 90 days and meeting all other requirements  I discussed the assessment and treatment plan with the patient. The patient was provided an opportunity to ask questions and all were answered. The patient agreed with the plan and demonstrated an understanding of the instructions.  Patient advised to call back or seek an in-person evaluation if the symptoms or condition worsens.  Duration of encounter: *** minutes.  Total time on encounter, as per AMA guidelines included both the face-to-face and non-face-to-face time personally spent by the physician and/or other qualified health care professional(s) on the day of the encounter (includes time in activities that require the  physician or other qualified health care professional and does not include time in activities normally performed by clinical staff). Physician's time may include the following activities when performed: preparing to see the patient (eg, review of tests, pre-charting review of records) obtaining and/or reviewing separately obtained history performing a medically appropriate examination and/or evaluation counseling and educating the patient/family/caregiver ordering medications, tests, or procedures referring and communicating with other health care professionals (when not separately reported) documenting clinical information in the electronic or other health record independently interpreting results (not separately reported) and communicating results to the patient/ family/caregiver care coordination (not separately reported)  Note by: Gaspar Cola, MD Date: 04/17/2022; Time: 12:00 PM

## 2022-04-17 ENCOUNTER — Ambulatory Visit: Payer: Medicaid Other | Admitting: Pain Medicine

## 2022-04-18 ENCOUNTER — Encounter: Payer: Self-pay | Admitting: Nurse Practitioner

## 2022-04-25 DIAGNOSIS — R32 Unspecified urinary incontinence: Secondary | ICD-10-CM | POA: Diagnosis not present

## 2022-04-25 DIAGNOSIS — S22000A Wedge compression fracture of unspecified thoracic vertebra, initial encounter for closed fracture: Secondary | ICD-10-CM | POA: Diagnosis not present

## 2022-04-27 NOTE — Progress Notes (Signed)
PROVIDER NOTE: Information contained herein reflects review and annotations entered in association with encounter. Interpretation of such information and data should be left to medically-trained personnel. Information provided to patient can be located elsewhere in the medical record under "Patient Instructions". Document created using STT-dictation technology, any transcriptional errors that may result from process are unintentional.    Patient: Kim [name]  Service Category: E/M  Provider: Gaspar Cola, MD  DOB: 23-Jan-1966  DOS: 04/28/2022  Referring Provider: [name], NP  MRN: [id number]  Specialty: Interventional Pain Management  PCP: [name], NP  Type: Established Patient  Setting: Ambulatory outpatient    Location: Office  Delivery: Face-to-face     HPI  [name], a 56 y.o. year old female, is here today because of her Chronic bilateral low back pain without sciatica [M54.50, G89.29]. [name] primary complain today is Back Pain Last encounter: My last encounter with her was on 04/01/2022. Pertinent problems: [name] has RLS (restless legs syndrome); Migraines; Lumbar radiculopathy; Cervical radiculopathy; T12 compression fracture, sequela; Headache disorder; Chronic low back pain (1ry area of Pain) (Bilateral) (L>R) w/o sciatica; Chronic pain syndrome; Abnormal MRI, lumbar spine (10/28/2021); Other spondylosis, cervical region (C4/C5 and C5/C6); Grade 1 Retrolisthesis of cervical region (2 mm) (C4/C5); DDD (degenerative disc disease), cervical; DDD (degenerative disc disease), lumbosacral; DDD (degenerative disc disease), thoracic; Lumbosacral facet arthropathy (Multilevel) (Bilateral); 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) (Bilateral: L5-S1); Lumbar lateral recess stenosis (L3-4) (Left); Grade 1 Retrolisthesis of L2/L3 and L3/L4; Lumbar disc annular tears; Lumbosacral lateral recess stenosis (L3-4, L5-S1) (Left); Chronic hip pain (2ry  area of Pain) (Bilateral) (L>R); Chronic lower extremity pain (3ry area of Pain) (Intermittent) (Left); Chronic thoracic back pain (4th area of Pain) (Midline) (Bilateral); Chronic neck and back pain (5th area of Pain) (Midline); Cervicalgia; Cervicogenic headache; Occipital headache (Bilateral); Chronic shoulder pain (6th area of Pain) (Bilateral); Carpal tunnel syndrome (Bilateral); Numbness and tingling in hands (Bilateral); Lumbar facet syndrome (Bilateral); Thoracic facet syndrome; Cervical facet syndrome; Spondylosis without myelopathy or radiculopathy, lumbosacral region; Chronic sacroiliac joint pain (Bilateral); Abnormal MRI, cervical spine (01/23/2022); and Abnormal MRI, thoracic spine (01/23/2022) on their pertinent problem list. Pain Assessment: Severity of Chronic pain is reported as a 0-No pain/10. Location: Back Lower/hips bilateral, down left leg to foot effects two toes in center of foot. Onset: More than a month ago. Quality: Discomfort, Aching. Timing: Intermittent. Modifying factor(s): heating ;pad x1, procedures. Vitals:  height is $RemoveB'5\' 6"'JDxpfUcG$  (1.676 m) and weight is 204 lb (92.5 kg). Her temperature is 97.6 F (36.4 C). Her blood pressure is 141/94 (abnormal) and her pulse is 98. Her respiration is 18.   Reason for encounter: post-procedure evaluation and assessment.  The patient attained 100% ongoing relief of her pain with the diagnostic lumbar facet block.  This would confirm that we are in fact dealing with a lumbar facet syndrome.  At this point since she has 100% relief, we do not need to do anything else.  The patient was instructed to give [place] a call when the pain returns, at which time we will plan on doing the diagnostic injection #2.  Today the patient was provided with some information regarding lumbar facet syndrome and the things that she needs to do to prevent flareups.  She was also informed of the possibility of radiofrequency ablation should this pain continues to return despite  conservative treatments with lumbar facet blocks.  She understood and accepted.  Post-procedure evaluation   Type: Lumbar Facet, Medial  Branch Block(s) #1  Laterality: Bilateral  Level: L2, L3, L4, L5, & S1 Medial Branch Level(s). Injecting these levels blocks the L3-4, L4-5 and L5-S1 lumbar facet joints. Imaging: Fluoroscopic guidance Anesthesia: Local anesthesia (1-2% Lidocaine) Anxiolysis: IV Versed 4.0 mg (High tolerance and very anxious) Sedation: Moderate conscious sedation. (Fentanyl 68m) DOS: 04/01/2022 Performed by: FGaspar Cola MD  Primary Purpose: Diagnostic/Therapeutic Indications: Low back pain severe enough to impact quality of life or function. 1. Lumbar facet syndrome (Bilateral)   2. Spondylosis without myelopathy or radiculopathy, lumbosacral region   3. Grade 1 Retrolisthesis of L2/L3 and L3/L4   4. DDD (degenerative disc disease), lumbosacral   5. Chronic low back pain (1ry area of Pain) (Bilateral) (L>R) w/o sciatica   6. Lumbosacral facet arthropathy (Multilevel) (Bilateral)    NAS-11 Pain score:   Pre-procedure: 6 /10   Post-procedure: 0-No pain/10       Effectiveness:  Initial hour after procedure: 100 %. Subsequent 4-6 hours post-procedure: 100 %. Analgesia past initial 6 hours: 100 % (current). Ongoing improvement:  Analgesic: The patient indicates having an ongoing 100% relief of her pain. Function: Ms. FFinkbinerreports improvement in function ROM: Ms. FCharrierreports improvement in ROM  Pharmacotherapy Assessment  Analgesic: No chronic opioid analgesics therapy prescribed by our practice. See 01/08/22 UDS. (+) THC. MME/day: 0 mg/day   Monitoring: [place] PMP: PDMP reviewed during this encounter.       Pharmacotherapy: No side-effects or adverse reactions reported. Compliance: No problems identified. Effectiveness: Clinically acceptable.  GIgnatius Specking RN  04/28/2022  9:50 AM  Sign when Signing Visit Safety precautions to be maintained  throughout the outpatient stay will include: orient to surroundings, keep bed in low position, maintain call bell within reach at all times, provide assistance with transfer out of bed and ambulation.     UDS:  Summary  Date Value Ref Range Status  01/08/2022 Note  Final    Comment:    ==================================================================== Compliance Drug Analysis, Ur ==================================================================== Test                             Result       Flag       Units  Drug Present not Declared for Prescription Verification   Carboxy-THC                    46           UNEXPECTED ng/mg creat    Carboxy-THC is a metabolite of tetrahydrocannabinol (THC). Source of    THC is most commonly herbal marijuana or marijuana-based products,    but THC is also present in a scheduled prescription medication.    Trace amounts of THC can be present in hemp and cannabidiol (CBD)    products. This test is not intended to distinguish between delta-9-    tetrahydrocannabinol, the predominant form of THC in most herbal or    marijuana-based products, and delta-8-tetrahydrocannabinol.  Drug Absent but Declared for Prescription Verification   Salicylate                     Not Detected UNEXPECTED    Salicylate, as indicated in the declared medication list, is not    always detected even when used as directed.     Aspirin, as indicated in the declared medication list, is not always    detected even when used as directed.  ==================================================================== Test  Result    Flag   Units      Ref Range   Creatinine              125              mg/dL      >=20 ==================================================================== Declared Medications:  The flagging and interpretation on this report are based on the  following declared medications.  Unexpected results may arise from  inaccuracies in the declared  medications.   **Note: The testing scope of this panel does not include small to  moderate amounts of these reported medications:   Aspirin  Salicylate (Salicylamide)   **Note: The testing scope of this panel does not include the  following reported medications:   Caffeine  Fluticasone (Flonase)  Ondansetron (Zofran)  Ramelteon (Rozerem)  Ropinirole (Requip)  Rosuvastatin (Crestor)  Sumatriptan ==================================================================== For clinical consultation, please call [phone]. ====================================================================      ROS  Constitutional: Denies any fever or chills Gastrointestinal: No reported hemesis, hematochezia, vomiting, or acute GI distress Musculoskeletal: Denies any acute onset joint swelling, redness, loss of ROM, or weakness Neurological: No reported episodes of acute onset apraxia, aphasia, dysarthria, agnosia, amnesia, paralysis, loss of coordination, or loss of consciousness  Medication Review  Aspirin-Salicylamide-Caffeine, SUMAtriptan, fluticasone, ondansetron, rOPINIRole, ramelteon, and rosuvastatin  History Review  Allergy: Ms. Mcgeehan is allergic to penicillins and sulfa antibiotics. Drug: Ms. Bermingham  reports that she does not currently use drugs after having used the following drugs: Marijuana. Alcohol:  reports current alcohol use of about 42.0 standard drinks of alcohol per week. Tobacco:  reports that she has been smoking cigarettes. She has a 156.00 pack-year smoking history. She has never used smokeless tobacco. Social: Ms. Canny  reports that she has been smoking cigarettes. She has a 156.00 pack-year smoking history. She has never used smokeless tobacco. She reports current alcohol use of about 42.0 standard drinks of alcohol per week. She reports that she does not currently use drugs after having used the following drugs: Marijuana. Medical:  has a past medical history of  Arthritis, Bipolar disorder ([place]), COPD (chronic obstructive pulmonary disease) ([place]), GERD (gastroesophageal reflux disease), Leg pain, Migraine, and Restless leg syndrome. Surgical: Ms. Urschel  has a past surgical history that includes Appendectomy; Total abdominal hysterectomy w/ bilateral salpingoophorectomy; Colostomy revision (2014); Carpal tunnel release; and breast surgey. Family: family history includes Alcohol abuse in her father; Heart attack in her brother, father, maternal grandmother, and paternal grandmother; Heart disease in her brother; Seizures in her daughter.  Laboratory Chemistry Profile   Renal Lab Results  Component Value Date   BUN 13 01/08/2022   CREATININE 0.82 01/08/2022   BCR 16 01/08/2022   GFRAA >60 12/06/2015   GFRNONAA >60 12/06/2015    Hepatic Lab Results  Component Value Date   AST 21 01/08/2022   ALT 23 11/01/2021   ALBUMIN 4.6 01/08/2022   ALKPHOS 137 (H) 01/08/2022   LIPASE 24 12/06/2015    Electrolytes Lab Results  Component Value Date   NA 141 01/08/2022   K 4.9 01/08/2022   CL 103 01/08/2022   CALCIUM 9.9 01/08/2022   MG 2.1 01/08/2022   PHOS 4.4 10/05/2012    Bone Lab Results  Component Value Date   25OHVITD1 35 01/08/2022   25OHVITD2 <1.0 01/08/2022   25OHVITD3 35 01/08/2022    Inflammation (CRP: Acute Phase) (ESR: Chronic Phase) Lab Results  Component Value Date   CRP 2 01/08/2022   ESRSEDRATE 22 01/08/2022  Note: Above Lab results reviewed.  Recent Imaging Review  DG PAIN CLINIC C-ARM 1-60 MIN NO REPORT Fluoro was used, but no Radiologist interpretation will be provided.  Please refer to "NOTES" tab for provider progress note. Note: Reviewed        Physical Exam  General appearance: Well nourished, well developed, and well hydrated. In no apparent acute distress Mental status: Alert, oriented x 3 (person, place, & time)       Respiratory: No evidence of acute respiratory distress Eyes: PERLA Vitals: BP (!)  141/94   Pulse 98   Temp 97.6 F (36.4 C)   Resp 18   Ht $R'5\' 6"'Ab$  (1.676 m)   Wt 204 lb (92.5 kg)   BMI 32.93 kg/m  BMI: Estimated body mass index is 32.93 kg/m as calculated from the following:   Height as of this encounter: $RemoveBeforeD'5\' 6"'kyxIpaLUWZHnWe$  (1.676 m).   Weight as of this encounter: 204 lb (92.5 kg). Ideal: Ideal body weight: 59.3 kg (130 lb 11.7 oz) Adjusted ideal body weight: 72.6 kg (160 lb 0.6 oz)  Assessment   Diagnosis Status  1. Chronic low back pain (1ry area of Pain) (Bilateral) (L>R) w/o sciatica   2. Lumbar facet syndrome (Bilateral)   3. Chronic hip pain (2ry area of Pain) (Bilateral) (L>R)   4. Chronic lower extremity pain (3ry area of Pain) (Intermittent) (Left)   5. Chronic thoracic back pain (4th area of Pain) (Midline) (Bilateral)   6. Grade 1 Retrolisthesis of L2/L3 and L3/L4    Controlled Controlled Controlled   Updated Problems: No problems updated.  Plan of Care  Problem-specific:  No problem-specific Assessment & Plan notes found for this encounter.  Ms. Rebecca Cairns Hosein has a current medication list which includes the following long-term medication(s): fluticasone, ramelteon, ropinirole, rosuvastatin, and sumatriptan.  Pharmacotherapy (Medications Ordered): No orders of the defined types were placed in this encounter.  Orders:  Orders Placed This Encounter  Procedures   LUMBAR FACET(MEDIAL BRANCH NERVE BLOCK) MBNB    Standing Status:   Standing    Number of Occurrences:   1    Standing Expiration Date:   04/29/2023    Scheduling Instructions:     Procedure: Lumbar facet block (AKA.: Lumbosacral medial branch nerve block) (diagnostic # 2)     Side: Bilateral     Level: L3-4 & L5-S1 Facets (L2, L3, L4, L5, & S1 Medial Branch Nerves)     Sedation: With Sedation.     Timeframe: PRN.  The patient will call when the pain returns.    Order Specific Question:   Where will this procedure be performed?    Answer:   [facility]   Follow-up plan:    Return if symptoms worsen or fail to improve.     Interventional Therapies  Risk  Complexity Considerations:   Estimated body mass index is 34.06 kg/m as calculated from the following:   Height as of this encounter: $RemoveBeforeD'5\' 6"'NDrsJcAaaSfrjI$  (1.676 m).   Weight as of this encounter: 211 lb (95.7 kg). WNL      Planned  Pending:      Under consideration:   Diagnostic bilateral lumbar facet MBB #2  Diagnostic left L4-5 LESI #1  Diagnostic left L5 and S1 TFESI #1  Diagnostic bilateral SI joint Blk #1  Diagnostic bilateral IA hip joint inj. #1  Diagnostic bilateral thoracic facet MBB #1  Diagnostic bilateral cervical facet MBB #1  Diagnostic bilateral IA shoulder joint inj. #1  Diagnostic bilateral  carpal tunnel syndrome inj. #1   Completed:   Diagnostic bilateral lumbar facet MBB x1 (04/01/2022) (100/100/100/100)    Completed by other providers:   Nerve block by [facility] in [facility]   Therapeutic  Palliative (PRN) options:   None established       Recent Visits Date Type Provider Dept  04/01/22 Procedure visit Milinda Pointer, MD [facility]  02/26/22 Office Visit Milinda Pointer, MD [facility]  Showing recent visits within past 90 days and meeting all other requirements Today's Visits Date Type Provider Dept  04/28/22 Office Visit Milinda Pointer, MD [facility]  Showing today's visits and meeting all other requirements Future Appointments No visits were found meeting these conditions. Showing future appointments within next 90 days and meeting all other requirements  I discussed the assessment and treatment plan with the patient. The patient was provided an opportunity to ask questions and all were answered. The patient agreed with the plan and demonstrated an understanding of the instructions.  Patient advised to call back or seek an in-person evaluation if the symptoms or condition worsens.  Duration of encounter: 30 minutes.  Total  time on encounter, as per AMA guidelines included both the face-to-face and non-face-to-face time personally spent by the physician and/or other qualified health care professional(s) on the day of the encounter (includes time in activities that require the physician or other qualified health care professional and does not include time in activities normally performed by clinical staff). Physician's time may include the following activities when performed: preparing to see the patient (eg, review of tests, pre-charting review of records) obtaining and/or reviewing separately obtained history performing a medically appropriate examination and/or evaluation counseling and educating the patient/family/caregiver ordering medications, tests, or procedures referring and communicating with other health care professionals (when not separately reported) documenting clinical information in the electronic or other health record independently interpreting results (not separately reported) and communicating results to the patient/ family/caregiver care coordination (not separately reported)  Note by: Gaspar Cola, MD Date: 04/28/2022; Time: 10:29 AM

## 2022-04-28 ENCOUNTER — Ambulatory Visit: Payer: Medicaid Other | Attending: Pain Medicine | Admitting: Pain Medicine

## 2022-04-28 ENCOUNTER — Encounter: Payer: Self-pay | Admitting: Pain Medicine

## 2022-04-28 VITALS — BP 141/94 | HR 98 | Temp 97.6°F | Resp 18 | Ht 66.0 in | Wt 204.0 lb

## 2022-04-28 DIAGNOSIS — M79605 Pain in left leg: Secondary | ICD-10-CM | POA: Diagnosis not present

## 2022-04-28 DIAGNOSIS — M25551 Pain in right hip: Secondary | ICD-10-CM | POA: Insufficient documentation

## 2022-04-28 DIAGNOSIS — M47816 Spondylosis without myelopathy or radiculopathy, lumbar region: Secondary | ICD-10-CM | POA: Diagnosis not present

## 2022-04-28 DIAGNOSIS — M25552 Pain in left hip: Secondary | ICD-10-CM | POA: Insufficient documentation

## 2022-04-28 DIAGNOSIS — M545 Low back pain, unspecified: Secondary | ICD-10-CM | POA: Insufficient documentation

## 2022-04-28 DIAGNOSIS — M546 Pain in thoracic spine: Secondary | ICD-10-CM | POA: Diagnosis not present

## 2022-04-28 DIAGNOSIS — M4310 Spondylolisthesis, site unspecified: Secondary | ICD-10-CM | POA: Insufficient documentation

## 2022-04-28 DIAGNOSIS — G8929 Other chronic pain: Secondary | ICD-10-CM | POA: Diagnosis not present

## 2022-04-28 NOTE — Progress Notes (Signed)
Safety precautions to be maintained throughout the outpatient stay will include: orient to surroundings, keep bed in low position, maintain call bell within reach at all times, provide assistance with transfer out of bed and ambulation.  

## 2022-05-01 ENCOUNTER — Ambulatory Visit: Payer: Medicaid Other | Admitting: Nurse Practitioner

## 2022-05-23 NOTE — Progress Notes (Unsigned)
There were no vitals taken for this visit.   Subjective:    Patient ID: Kim [name], female    DOB: Nov 25, 1965, 56 y.o.   MRN: [id number]  HPI: Kim [name] is a 56 y.o. female  No chief complaint on file.  COPD COPD status: uncontrolled Satisfied with current treatment?: no Oxygen use: no Dyspnea frequency:  Cough frequency: everyday sometimes constant if she doesn't have a drink to calm it down. Rescue inhaler frequency:   Limitation of activity: yes Productive cough:  Last Spirometry:  Pneumovax: Not up to date Influenza: Not up to Date Does have wheezing some days  MIGRAINES Patient states the Immitrex injections have been denied by her in insurance company due to the neurologist putting she has rebound headaches.  Patient only has two injections left which is worrisome since she relies on it when she has a migraine.   Relevant past medical, surgical, family and social history reviewed and updated as indicated. Interim medical history since our last visit reviewed. Allergies and medications reviewed and updated.  Review of Systems  Respiratory:  Positive for cough, shortness of breath and wheezing.   Neurological:  Positive for headaches.   Per HPI unless specifically indicated above     Objective:    There were no vitals taken for this visit.  Wt Readings from Last 3 Encounters:  04/28/22 204 lb (92.5 kg)  04/01/22 204 lb (92.5 kg)  02/26/22 204 lb (92.5 kg)    Physical Exam Vitals and nursing note reviewed.  Constitutional:      General: She is not in acute distress.    Appearance: Normal appearance. She is normal weight. She is not ill-appearing, toxic-appearing or diaphoretic.  HENT:     Head: Normocephalic.     Right Ear: External ear normal.     Left Ear: External ear normal.     Nose: Nose normal.     Mouth/Throat:     Mouth: Mucous membranes are moist.     Pharynx: Oropharynx is clear.  Eyes:     General:        Right eye: No  discharge.        Left eye: No discharge.     Extraocular Movements: Extraocular movements intact.     Conjunctiva/sclera: Conjunctivae normal.     Pupils: Pupils are equal, round, and reactive to light.  Cardiovascular:     Rate and Rhythm: Normal rate and regular rhythm.     Heart sounds: No murmur heard. Pulmonary:     Effort: Pulmonary effort is normal. No respiratory distress.     Breath sounds: Normal breath sounds. No wheezing or rales.  Musculoskeletal:     Cervical back: Normal range of motion and neck supple.  Skin:    General: Skin is warm and dry.     Capillary Refill: Capillary refill takes less than 2 seconds.  Neurological:     General: No focal deficit present.     Mental Status: She is alert and oriented to person, place, and time. Mental status is at baseline.  Psychiatric:        Mood and Affect: Mood normal.        Behavior: Behavior normal.        Thought Content: Thought content normal.        Judgment: Judgment normal.    Results for orders placed or performed in visit on 01/08/22  Compliance Drug Analysis, Ur  Result Value Ref Range   Summary  Note   Comp. Metabolic Panel (12)  Result Value Ref Range   Glucose 123 (H) 70 - 99 mg/dL   BUN 13 6 - 24 mg/dL   Creatinine, Ser 0.82 0.57 - 1.00 mg/dL   eGFR 84 >59 mL/min/1.73   BUN/Creatinine Ratio 16 9 - 23   Sodium 141 134 - 144 mmol/L   Potassium 4.9 3.5 - 5.2 mmol/L   Chloride 103 96 - 106 mmol/L   Calcium 9.9 8.7 - 10.2 mg/dL   Total Protein 7.4 6.0 - 8.5 g/dL   Albumin 4.6 3.8 - 4.9 g/dL   Globulin, Total 2.8 1.5 - 4.5 g/dL   Albumin/Globulin Ratio 1.6 1.2 - 2.2   Bilirubin Total 0.4 0.0 - 1.2 mg/dL   Alkaline Phosphatase 137 (H) 44 - 121 IU/L   AST 21 0 - 40 IU/L  Magnesium  Result Value Ref Range   Magnesium 2.1 1.6 - 2.3 mg/dL  Vitamin B12  Result Value Ref Range   Vitamin B-12 436 232 - 1,245 pg/mL  Sedimentation rate  Result Value Ref Range   Sed Rate 22 0 - 40 mm/hr  25-Hydroxy  vitamin D Lcms D2+D3  Result Value Ref Range   25-Hydroxy, Vitamin D 35 ng/mL   25-Hydroxy, Vitamin D-2 <1.0 ng/mL   25-Hydroxy, Vitamin D-3 35 ng/mL  C-reactive protein  Result Value Ref Range   CRP 2 0 - 10 mg/L      Assessment & Plan:   Problem List Items Addressed This Visit      Cardiovascular and Mediastinum   Aortic atherosclerosis ([place]) - Primary     Respiratory   Centrilobular emphysema ([place])     Follow up plan: No follow-ups on file.  A total of 30 minutes were spent on this encounter today.  When total time is documented, this includes both the face-to-face and non-face-to-face time personally spent before, during and after the visit on the date of the encounter discussing plan of care moving forward for Migraines in case Immitrex is not covered.

## 2022-05-26 ENCOUNTER — Encounter: Payer: Self-pay | Admitting: Nurse Practitioner

## 2022-05-26 ENCOUNTER — Ambulatory Visit (INDEPENDENT_AMBULATORY_CARE_PROVIDER_SITE_OTHER): Payer: Medicaid Other | Admitting: Nurse Practitioner

## 2022-05-26 VITALS — BP 124/83 | HR 78 | Temp 98.3°F | Wt 189.6 lb

## 2022-05-26 DIAGNOSIS — G894 Chronic pain syndrome: Secondary | ICD-10-CM | POA: Diagnosis not present

## 2022-05-26 DIAGNOSIS — M79642 Pain in left hand: Secondary | ICD-10-CM | POA: Diagnosis not present

## 2022-05-26 DIAGNOSIS — I700 Atherosclerosis of aorta: Secondary | ICD-10-CM | POA: Diagnosis not present

## 2022-05-26 DIAGNOSIS — M79641 Pain in right hand: Secondary | ICD-10-CM

## 2022-05-26 DIAGNOSIS — J432 Centrilobular emphysema: Secondary | ICD-10-CM | POA: Diagnosis not present

## 2022-05-26 NOTE — Assessment & Plan Note (Signed)
Chronic, uncontrolled.  Did not end up seeing Pulmonology.  Discussed use of albuterol PRN for SOB.  Follow up in 6 months for reevaluation.  Patient smokes about 4 packs of cigarettes daily.  Not ready to quit.

## 2022-05-26 NOTE — Assessment & Plan Note (Signed)
Chronic. Followed by Pain Management. Will be receiving second set of injections once insurance approves them. Continue to follow per their recommendations.  Will also check labs to rule out autoimmune disorder due to patient's hand pain.

## 2022-05-26 NOTE — Assessment & Plan Note (Signed)
Noted on past CT lung screening.  Recommend restarting Statin.  Patient agrees.  Recommend daily ASA, refused.

## 2022-05-27 DIAGNOSIS — R32 Unspecified urinary incontinence: Secondary | ICD-10-CM | POA: Diagnosis not present

## 2022-05-27 DIAGNOSIS — S22000A Wedge compression fracture of unspecified thoracic vertebra, initial encounter for closed fracture: Secondary | ICD-10-CM | POA: Diagnosis not present

## 2022-05-31 LAB — COMPREHENSIVE METABOLIC PANEL
ALT: 25 IU/L (ref 0–32)
AST: 31 IU/L (ref 0–40)
Albumin/Globulin Ratio: 1.8 (ref 1.2–2.2)
Albumin: 4.5 g/dL (ref 3.8–4.9)
Alkaline Phosphatase: 122 IU/L — ABNORMAL HIGH (ref 44–121)
BUN/Creatinine Ratio: 11 (ref 9–23)
BUN: 8 mg/dL (ref 6–24)
Bilirubin Total: 0.2 mg/dL (ref 0.0–1.2)
CO2: 21 mmol/L (ref 20–29)
Calcium: 9.6 mg/dL (ref 8.7–10.2)
Chloride: 103 mmol/L (ref 96–106)
Creatinine, Ser: 0.71 mg/dL (ref 0.57–1.00)
Globulin, Total: 2.5 g/dL (ref 1.5–4.5)
Glucose: 90 mg/dL (ref 70–99)
Potassium: 4.2 mmol/L (ref 3.5–5.2)
Sodium: 141 mmol/L (ref 134–144)
Total Protein: 7 g/dL (ref 6.0–8.5)
eGFR: 100 mL/min/{1.73_m2} (ref >59)

## 2022-05-31 LAB — CBC WITH DIFFERENTIAL/PLATELET
Basophils Absolute: 0.1 10*3/uL (ref 0.0–0.2)
Basos: 1 %
EOS (ABSOLUTE): 0.1 10*3/uL (ref 0.0–0.4)
Eos: 1 %
Hematocrit: 45.3 % (ref 34.0–46.6)
Hemoglobin: 15.8 g/dL (ref 11.1–15.9)
Immature Grans (Abs): 0 10*3/uL (ref 0.0–0.1)
Immature Granulocytes: 0 %
Lymphocytes Absolute: 1.6 10*3/uL (ref 0.7–3.1)
Lymphs: 21 %
MCH: 33.5 pg — ABNORMAL HIGH (ref 26.6–33.0)
MCHC: 34.9 g/dL (ref 31.5–35.7)
MCV: 96 fL (ref 79–97)
Monocytes Absolute: 0.5 10*3/uL (ref 0.1–0.9)
Monocytes: 6 %
Neutrophils Absolute: 5.4 10*3/uL (ref 1.4–7.0)
Neutrophils: 71 %
Platelets: 268 10*3/uL (ref 150–450)
RBC: 4.72 x10E6/uL (ref 3.77–5.28)
RDW: 13.3 % (ref 11.7–15.4)
WBC: 7.6 10*3/uL (ref 3.4–10.8)

## 2022-05-31 LAB — EHRLICHIA ANTIBODY PANEL
E. Chaffeensis (HME) IgM Titer: NEGATIVE
E.Chaffeensis (HME) IgG: NEGATIVE
HGE IgG Titer: NEGATIVE
HGE IgM Titer: NEGATIVE

## 2022-05-31 LAB — ANA+ENA+DNA/DS+ANTICH+CENTR
ANA Titer 1: NEGATIVE
Anti JO-1: <0.2 AI (ref 0.0–0.9)
Centromere Ab Screen: <0.2 AI (ref 0.0–0.9)
Chromatin Ab SerPl-aCnc: <0.2 AI (ref 0.0–0.9)
ENA RNP Ab: <0.2 AI (ref 0.0–0.9)
ENA SM Ab Ser-aCnc: <0.2 AI (ref 0.0–0.9)
ENA SSA (RO) Ab: <0.2 AI (ref 0.0–0.9)
ENA SSB (LA) Ab: 0.3 AI (ref 0.0–0.9)
Scleroderma (Scl-70) (ENA) Antibody, IgG: <0.2 AI (ref 0.0–0.9)
dsDNA Ab: <1 IU/mL (ref 0–9)

## 2022-05-31 LAB — BABESIA MICROTI ANTIBODY PANEL
Babesia microti IgG: <1:10 {titer}
Babesia microti IgM: <1:10 {titer}

## 2022-05-31 LAB — RHEUMATOID FACTOR: Rheumatoid fact SerPl-aCnc: <10 IU/mL (ref <14.0)

## 2022-05-31 LAB — CK: Total CK: 118 U/L (ref 32–182)

## 2022-05-31 LAB — URIC ACID: Uric Acid: 5.7 mg/dL (ref 3.0–7.2)

## 2022-05-31 LAB — ROCKY MTN SPOTTED FVR ABS PNL(IGG+IGM)
RMSF IgG: NEGATIVE
RMSF IgM: 0.45 index (ref 0.00–0.89)

## 2022-05-31 LAB — ANA: Anti Nuclear Antibody (ANA): NEGATIVE

## 2022-06-02 ENCOUNTER — Telehealth: Payer: Self-pay | Admitting: Pain Medicine

## 2022-06-02 ENCOUNTER — Encounter: Payer: Self-pay | Admitting: Nurse Practitioner

## 2022-06-02 NOTE — Progress Notes (Signed)
I recommend another visit to discuss options.

## 2022-06-02 NOTE — Telephone Encounter (Signed)
Patient states she called about a week ago to ask about getting more injections. Was told they have to get authorization. Can you let her know status please.

## 2022-06-02 NOTE — Progress Notes (Signed)
Good Morning.  Your lab work has come back.  No evidence of autoimmune disorder to explain your pain.  Your other lab work is consistent from prior.  No other concerns at this time.  Follow up as discussed.

## 2022-06-05 NOTE — Telephone Encounter (Signed)
Appointment has been scheduled.

## 2022-06-09 ENCOUNTER — Encounter: Payer: Self-pay | Admitting: Nurse Practitioner

## 2022-06-09 ENCOUNTER — Telehealth (INDEPENDENT_AMBULATORY_CARE_PROVIDER_SITE_OTHER): Payer: Medicaid Other | Admitting: Nurse Practitioner

## 2022-06-09 DIAGNOSIS — G2581 Restless legs syndrome: Secondary | ICD-10-CM

## 2022-06-09 MED ORDER — PREGABALIN 50 MG PO CAPS: 50.0000 mg | ORAL_CAPSULE | Freq: Two times a day (BID) | ORAL | 0 refills | Status: DC

## 2022-06-09 NOTE — Progress Notes (Unsigned)
There were no vitals taken for this visit.   Subjective:    Patient ID: Kim [name], female    DOB: 1965/10/31, 56 y.o.   MRN: [id number]  HPI: Kim [name] Ende is a 57 y.o. female  Chief Complaint  Patient presents with   Med Change Request   PAIN Patient states she has her shots scheduled with pain management for tomorrow.  Patient states she is having pain in her legs and arms.  Pain has worsened over the last several years.  She has been taking the Requip since 2007 and it is not helping her pain.  She gets the pain in her arms and feels like they are nerve jerking.  She has tried Lyrica in the past and states that it helped her pain.  She would like to restart the Lyrica.  Relevant past medical, surgical, family and social history reviewed and updated as indicated. Interim medical history since our last visit reviewed. Allergies and medications reviewed and updated.  Review of Systems  Per HPI unless specifically indicated above     Objective:    There were no vitals taken for this visit.  Wt Readings from Last 3 Encounters:  05/26/22 189 lb 9.6 oz (86 kg)  04/28/22 204 lb (92.5 kg)  04/01/22 204 lb (92.5 kg)    Physical Exam  Results for orders placed or performed in visit on 05/26/22  Rheumatoid factor  Result Value Ref Range   Rhuematoid fact SerPl-aCnc <10.0 <14.0 IU/mL  ANA  Result Value Ref Range   Anti Nuclear Antibody (ANA) Negative Negative  ANA+ENA+DNA/DS+Antich+Centr  Result Value Ref Range   ANA Titer 1 Negative    dsDNA Ab <1 0 - 9 IU/mL   ENA RNP Ab <0.2 0.0 - 0.9 AI   ENA SM Ab Ser-aCnc <0.2 0.0 - 0.9 AI   Scleroderma (Scl-70) (ENA) Antibody, IgG <0.2 0.0 - 0.9 AI   ENA SSA (RO) Ab <0.2 0.0 - 0.9 AI   ENA SSB (LA) Ab 0.3 0.0 - 0.9 AI   Chromatin Ab SerPl-aCnc <0.2 0.0 - 0.9 AI   Anti JO-1 <0.2 0.0 - 0.9 AI   Centromere Ab Screen <0.2 0.0 - 0.9 AI   See below: Comment   CK  Result Value Ref Range   Total CK 118 32 - 182 U/L   Rocky mtn spotted fvr abs pnl(IgG+IgM)  Result Value Ref Range   RMSF IgG Negative Negative   RMSF IgM 0.45 0.00 - 0.89 index  Uric acid  Result Value Ref Range   Uric Acid 5.7 3.0 - 7.2 mg/dL  Ehrlichia antibody panel  Result Value Ref Range   E.Chaffeensis (HME) IgG Negative Neg:<1:64   E. Chaffeensis (HME) IgM Titer Negative Neg:<1:20   HGE IgG Titer Negative Neg:<1:64   HGE IgM Titer Negative Neg:<1:20   Result Comment: Comment   Babesia microti Antibody Panel  Result Value Ref Range   Babesia microti IgG <1:10 Neg:<1:10   Babesia microti IgM <1:10 Neg:<1:10   Result Comment: Comment   Comprehensive metabolic panel  Result Value Ref Range   Glucose 90 70 - 99 mg/dL   BUN 8 6 - 24 mg/dL   Creatinine, Ser 0.71 0.57 - 1.00 mg/dL   eGFR 100 >59 mL/min/1.73   BUN/Creatinine Ratio 11 9 - 23   Sodium 141 134 - 144 mmol/L   Potassium 4.2 3.5 - 5.2 mmol/L   Chloride 103 96 - 106 mmol/L   CO2 21 20 -  29 mmol/L   Calcium 9.6 8.7 - 10.2 mg/dL   Total Protein 7.0 6.0 - 8.5 g/dL   Albumin 4.5 3.8 - 4.9 g/dL   Globulin, Total 2.5 1.5 - 4.5 g/dL   Albumin/Globulin Ratio 1.8 1.2 - 2.2   Bilirubin Total 0.2 0.0 - 1.2 mg/dL   Alkaline Phosphatase 122 (H) 44 - 121 IU/L   AST 31 0 - 40 IU/L   ALT 25 0 - 32 IU/L  CBC with Differential/Platelet  Result Value Ref Range   WBC 7.6 3.4 - 10.8 x10E3/uL   RBC 4.72 3.77 - 5.28 x10E6/uL   Hemoglobin 15.8 11.1 - 15.9 g/dL   Hematocrit 45.3 34.0 - 46.6 %   MCV 96 79 - 97 fL   MCH 33.5 (H) 26.6 - 33.0 pg   MCHC 34.9 31.5 - 35.7 g/dL   RDW 13.3 11.7 - 15.4 %   Platelets 268 150 - 450 x10E3/uL   Neutrophils 71 Not Estab. %   Lymphs 21 Not Estab. %   Monocytes 6 Not Estab. %   Eos 1 Not Estab. %   Basos 1 Not Estab. %   Neutrophils Absolute 5.4 1.4 - 7.0 x10E3/uL   Lymphocytes Absolute 1.6 0.7 - 3.1 x10E3/uL   Monocytes Absolute 0.5 0.1 - 0.9 x10E3/uL   EOS (ABSOLUTE) 0.1 0.0 - 0.4 x10E3/uL   Basophils Absolute 0.1 0.0 - 0.2 x10E3/uL    Immature Granulocytes 0 Not Estab. %   Immature Grans (Abs) 0.0 0.0 - 0.1 x10E3/uL      Assessment & Plan:   Problem List Items Addressed This Visit       Other   RLS (restless legs syndrome) - Primary     Follow up plan: Return in about 1 month (around 07/09/2022) for Medication Management.      

## 2022-06-10 ENCOUNTER — Other Ambulatory Visit: Payer: Self-pay

## 2022-06-10 ENCOUNTER — Ambulatory Visit
Admission: RE | Admit: 2022-06-10 | Discharge: 2022-06-10 | Disposition: A | Discharge status: Home / Self Care | Payer: Medicaid Other | Source: Ambulatory Visit | Attending: Pain Medicine | Admitting: Pain Medicine

## 2022-06-10 ENCOUNTER — Ambulatory Visit: Payer: Medicaid Other | Attending: Pain Medicine | Admitting: Pain Medicine

## 2022-06-10 ENCOUNTER — Encounter: Payer: Self-pay | Admitting: Pain Medicine

## 2022-06-10 VITALS — BP 106/66 | HR 87 | Temp 97.9°F | Resp 16 | Ht 66.0 in | Wt 189.0 lb

## 2022-06-10 DIAGNOSIS — M4310 Spondylolisthesis, site unspecified: Secondary | ICD-10-CM | POA: Diagnosis present

## 2022-06-10 DIAGNOSIS — M5137 Other intervertebral disc degeneration, lumbosacral region: Secondary | ICD-10-CM | POA: Diagnosis present

## 2022-06-10 DIAGNOSIS — M47817 Spondylosis without myelopathy or radiculopathy, lumbosacral region: Secondary | ICD-10-CM | POA: Diagnosis present

## 2022-06-10 DIAGNOSIS — M47816 Spondylosis without myelopathy or radiculopathy, lumbar region: Secondary | ICD-10-CM | POA: Insufficient documentation

## 2022-06-10 DIAGNOSIS — M545 Low back pain, unspecified: Secondary | ICD-10-CM | POA: Diagnosis present

## 2022-06-10 DIAGNOSIS — G8929 Other chronic pain: Secondary | ICD-10-CM | POA: Insufficient documentation

## 2022-06-10 DIAGNOSIS — R937 Abnormal findings on diagnostic imaging of other parts of musculoskeletal system: Secondary | ICD-10-CM | POA: Diagnosis present

## 2022-06-10 MED ORDER — LIDOCAINE HCL 2 % IJ SOLN
INTRAMUSCULAR | Status: AC
  Filled 2022-06-10: qty 20

## 2022-06-10 MED ORDER — ROPIVACAINE HCL 2 MG/ML IJ SOLN
18.0000 mL | Freq: Once | INTRAMUSCULAR | Status: AC
  Administered 2022-06-10: 18 mL via PERINEURAL

## 2022-06-10 MED ORDER — MIDAZOLAM HCL 5 MG/5ML IJ SOLN
INTRAMUSCULAR | Status: AC
  Filled 2022-06-10: qty 5

## 2022-06-10 MED ORDER — LACTATED RINGERS IV SOLN: Freq: Once | INTRAVENOUS | Status: AC

## 2022-06-10 MED ORDER — FENTANYL CITRATE (PF) 100 MCG/2ML IJ SOLN
INTRAMUSCULAR | Status: AC
  Filled 2022-06-10: qty 2

## 2022-06-10 MED ORDER — TRIAMCINOLONE ACETONIDE 40 MG/ML IJ SUSP
80.0000 mg | Freq: Once | INTRAMUSCULAR | Status: AC
  Administered 2022-06-10: 80 mg

## 2022-06-10 MED ORDER — MIDAZOLAM HCL 5 MG/5ML IJ SOLN
0.5000 mg | Freq: Once | INTRAMUSCULAR | Status: AC
  Administered 2022-06-10: 3 mg via INTRAVENOUS

## 2022-06-10 MED ORDER — ROPIVACAINE HCL 2 MG/ML IJ SOLN
INTRAMUSCULAR | Status: AC
  Filled 2022-06-10: qty 20

## 2022-06-10 MED ORDER — FENTANYL CITRATE (PF) 100 MCG/2ML IJ SOLN
25.0000 ug | INTRAMUSCULAR | Status: DC | PRN
  Administered 2022-06-10: 100 ug via INTRAVENOUS

## 2022-06-10 MED ORDER — LIDOCAINE HCL 2 % IJ SOLN
20.0000 mL | Freq: Once | INTRAMUSCULAR | Status: AC
  Administered 2022-06-10: 400 mg

## 2022-06-10 MED ORDER — TRIAMCINOLONE ACETONIDE 40 MG/ML IJ SUSP
INTRAMUSCULAR | Status: AC
  Filled 2022-06-10: qty 2

## 2022-06-10 MED ORDER — PENTAFLUOROPROP-TETRAFLUOROETH EX AERO
INHALATION_SPRAY | Freq: Once | CUTANEOUS | Status: AC
  Administered 2022-06-10: 30 via TOPICAL
  Filled 2022-06-10: qty 116

## 2022-06-10 NOTE — Progress Notes (Signed)
Safety precautions to be maintained throughout the outpatient stay will include: orient to surroundings, keep bed in low position, maintain call bell within reach at all times, provide assistance with transfer out of bed and ambulation.  

## 2022-06-10 NOTE — Assessment & Plan Note (Signed)
Chronic. Not well controlled.  On highest dose of Requip but pain is not well controlled.  Will start Lyrica 50mg  BID. Discussed decreasing dose of Requip.  Discussed side effects and benefits of medication discussed during visit.  Follow up in 1 month.  Call sooner if concerns arise.

## 2022-06-10 NOTE — Progress Notes (Signed)
PROVIDER NOTE: Interpretation of information contained herein should be left to medically-trained personnel. Specific patient instructions are provided elsewhere under "Patient Instructions" section of medical record. This document was created in part using STT-dictation technology, any transcriptional errors that may result from this process are unintentional.  Patient: Kim [name] Type: Established DOB: December 14, 1965 MRN: [id number] PCP: Larae GroomsHoldsworth, [name], NP  Service: Procedure DOS: 06/10/2022 Setting: Ambulatory Location: Ambulatory outpatient facility Delivery: Face-to-face Provider: Oswaldo DoneFrancisco A Yailen Zemaitis, MD Specialty: Interventional Pain Management Specialty designation: 09 Location: Outpatient facility Ref. Prov.: Delano MetzNaveira, Veronica Fretz, MD    Procedure:           Type: Lumbar Facet, Medial Branch Block(s) #2  Laterality: Bilateral  Level: L2, L3, L4, L5, & S1 Medial Branch Level(s). Injecting these levels blocks the L3-4, L4-5 and L5-S1 lumbar facet joints. Imaging: Fluoroscopic guidance         Anesthesia: Local anesthesia (1-2% Lidocaine) Anxiolysis: IV Versed 3.0 mg Sedation: Moderate Sedation Fentanyl 2.0 mL (100 mcg) DOS: 06/10/2022 Performed by: Oswaldo DoneFrancisco A Jackye Dever, MD  Primary Purpose: Diagnostic/Therapeutic Indications: Low back pain severe enough to impact quality of life or function. 1. Lumbar facet syndrome (Bilateral)   2. Grade 1 Retrolisthesis of L2/L3 and L3/L4   3. Spondylosis without myelopathy or radiculopathy, lumbosacral region   4. DDD (degenerative disc disease), lumbosacral   5. Chronic low back pain (1ry area of Pain) (Bilateral) (L>R) w/o sciatica   6. Abnormal MRI, lumbar spine (10/28/2021)    NAS-11 Pain score:   Pre-procedure: 9 /10   Post-procedure: 0-No pain/10     Position / Prep / Materials:  Position: Prone  Prep solution: DuraPrep (Iodine Povacrylex [0.7% available iodine] and Isopropyl Alcohol, 74% w/w) Area Prepped: Posterolateral  Lumbosacral Spine (Wide prep: From the lower border of the scapula down to the end of the tailbone and from flank to flank.)  Materials:  Tray: Block Needle(s):  Type: Spinal  Gauge (G): 22  Length: 3.5-in Qty: 4     Pre-op H&P Assessment:  Kim [name] is a 56 y.o. (year old), female patient, seen today for interventional treatment. She  has a past surgical history that includes Appendectomy; Total abdominal hysterectomy w/ bilateral salpingoophorectomy; Colostomy revision (2014); Carpal tunnel release; and breast surgey. Kim [name] has a current medication list which includes the following prescription(s): aspirin-salicylamide-caffeine, fluticasone, ondansetron, ramelteon, ropinirole, rosuvastatin, sumatriptan, and pregabalin, and the following Facility-Administered Medications: fentanyl and lactated ringers. Her primarily concern today is the Back Pain (Lumbar bilateral )  Initial Vital Signs:  Pulse/HCG Rate: 87ECG Heart Rate: 90 (NSR) Temp: 97.9 F (36.6 C) Resp: 16 BP: (!) 117/91 SpO2: 99 %  BMI: Estimated body mass index is 30.51 kg/m as calculated from the following:   Height as of this encounter: 5\' 6"  (1.676 m).   Weight as of this encounter: 189 lb (85.7 kg).  Risk Assessment: Allergies: Reviewed. She is allergic to penicillins and sulfa antibiotics.  Allergy Precautions: None required Coagulopathies: Reviewed. None identified.  Blood-thinner therapy: None at this time Active Infection(s): Reviewed. None identified. Kim [name] is afebrile  Site Confirmation: Kim [name] was asked to confirm the procedure and laterality before marking the site Procedure checklist: Completed Consent: Before the procedure and under the influence of no sedative(s), amnesic(s), or anxiolytics, the patient was informed of the treatment options, risks and possible complications. To fulfill our ethical and legal obligations, as recommended by the American Medical Association's Code of Ethics, I  have informed the patient of my clinical impression; the  nature and purpose of the treatment or procedure; the risks, benefits, and possible complications of the intervention; the alternatives, including doing nothing; the risk(s) and benefit(s) of the alternative treatment(s) or procedure(s); and the risk(s) and benefit(s) of doing nothing. The patient was provided information about the general risks and possible complications associated with the procedure. These may include, but are not limited to: failure to achieve desired goals, infection, bleeding, organ or nerve damage, allergic reactions, paralysis, and death. In addition, the patient was informed of those risks and complications associated to Spine-related procedures, such as failure to decrease pain; infection (i.e.: Meningitis, epidural or intraspinal abscess); bleeding (i.e.: epidural hematoma, subarachnoid hemorrhage, or any other type of intraspinal or peri-dural bleeding); organ or nerve damage (i.e.: Any type of peripheral nerve, nerve root, or spinal cord injury) with subsequent damage to sensory, motor, and/or autonomic systems, resulting in permanent pain, numbness, and/or weakness of one or several areas of the body; allergic reactions; (i.e.: anaphylactic reaction); and/or death. Furthermore, the patient was informed of those risks and complications associated with the medications. These include, but are not limited to: allergic reactions (i.e.: anaphylactic or anaphylactoid reaction(s)); adrenal axis suppression; blood sugar elevation that in diabetics may result in ketoacidosis or comma; water retention that in patients with history of congestive heart failure may result in shortness of breath, pulmonary edema, and decompensation with resultant heart failure; weight gain; swelling or edema; medication-induced neural toxicity; particulate matter embolism and blood vessel occlusion with resultant organ, and/or nervous system infarction;  and/or aseptic necrosis of one or more joints. Finally, the patient was informed that Medicine is not an exact science; therefore, there is also the possibility of unforeseen or unpredictable risks and/or possible complications that may result in a catastrophic outcome. The patient indicated having understood very clearly. We have given the patient no guarantees and we have made no promises. Enough time was given to the patient to ask questions, all of which were answered to the patient's satisfaction. Kim [name] has indicated that she wanted to continue with the procedure. Attestation: I, the ordering provider, attest that I have discussed with the patient the benefits, risks, side-effects, alternatives, likelihood of achieving goals, and potential problems during recovery for the procedure that I have provided informed consent. Date  Time: 06/10/2022  9:14 AM  Pre-Procedure Preparation:  Monitoring: As per clinic protocol. Respiration, ETCO2, SpO2, BP, heart rate and rhythm monitor placed and checked for adequate function Safety Precautions: Patient was assessed for positional comfort and pressure points before starting the procedure. Time-out: I initiated and conducted the "Time-out" before starting the procedure, as per protocol. The patient was asked to participate by confirming the accuracy of the "Time Out" information. Verification of the correct person, site, and procedure were performed and confirmed by me, the nursing staff, and the patient. "Time-out" conducted as per Joint Commission's Universal Protocol (UP.01.01.01). Time: 1020  Description of Procedure:          Laterality: Bilateral. The procedure was performed in identical fashion on both sides. Targeted Levels:  L2, L3, L4, L5, & S1 Medial Branch Level(s)  Safety Precautions: Aspiration looking for blood return was conducted prior to all injections. At no point did we inject any substances, as a needle was being advanced. Before  injecting, the patient was told to immediately notify me if she was experiencing any new onset of "ringing in the ears, or metallic taste in the mouth". No attempts were made at seeking any paresthesias. Safe injection practices  and needle disposal techniques used. Medications properly checked for expiration dates. SDV (single dose vial) medications used. After the completion of the procedure, all disposable equipment used was discarded in the proper designated medical waste containers. Local Anesthesia: Protocol guidelines were followed. The patient was positioned over the fluoroscopy table. The area was prepped in the usual manner. The time-out was completed. The target area was identified using fluoroscopy. A 12-in long, straight, sterile hemostat was used with fluoroscopic guidance to locate the targets for each level blocked. Once located, the skin was marked with an approved surgical skin marker. Once all sites were marked, the skin (epidermis, dermis, and hypodermis), as well as deeper tissues (fat, connective tissue and muscle) were infiltrated with a small amount of a short-acting local anesthetic, loaded on a 10cc syringe with a 25G, 1.5-in  Needle. An appropriate amount of time was allowed for local anesthetics to take effect before proceeding to the next step. Local Anesthetic: Lidocaine 2.0% The unused portion of the local anesthetic was discarded in the proper designated containers. Technical description of process:  L2 Medial Branch Nerve Block (MBB): The target area for the L2 medial branch is at the junction of the postero-lateral aspect of the superior articular process and the superior, posterior, and medial edge of the transverse process of L3. Under fluoroscopic guidance, a Quincke needle was inserted until contact was made with os over the superior postero-lateral aspect of the pedicular shadow (target area). After negative aspiration for blood, 0.5 mL of the nerve block solution was  injected without difficulty or complication. The needle was removed intact. L3 Medial Branch Nerve Block (MBB): The target area for the L3 medial branch is at the junction of the postero-lateral aspect of the superior articular process and the superior, posterior, and medial edge of the transverse process of L4. Under fluoroscopic guidance, a Quincke needle was inserted until contact was made with os over the superior postero-lateral aspect of the pedicular shadow (target area). After negative aspiration for blood, 0.5 mL of the nerve block solution was injected without difficulty or complication. The needle was removed intact. L4 Medial Branch Nerve Block (MBB): The target area for the L4 medial branch is at the junction of the postero-lateral aspect of the superior articular process and the superior, posterior, and medial edge of the transverse process of L5. Under fluoroscopic guidance, a Quincke needle was inserted until contact was made with os over the superior postero-lateral aspect of the pedicular shadow (target area). After negative aspiration for blood, 0.5 mL of the nerve block solution was injected without difficulty or complication. The needle was removed intact. L5 Medial Branch Nerve Block (MBB): The target area for the L5 medial branch is at the junction of the postero-lateral aspect of the superior articular process and the superior, posterior, and medial edge of the sacral ala. Under fluoroscopic guidance, a Quincke needle was inserted until contact was made with os over the superior postero-lateral aspect of the pedicular shadow (target area). After negative aspiration for blood, 0.5 mL of the nerve block solution was injected without difficulty or complication. The needle was removed intact. S1 Medial Branch Nerve Block (MBB): The target area for the S1 medial branch is at the posterior and inferior 6 o'clock position of the L5-S1 facet joint. Under fluoroscopic guidance, the Quincke needle  inserted for the L5 MBB was redirected until contact was made with os over the inferior and postero aspect of the sacrum, at the 6 o' clock position under  the L5-S1 facet joint (Target area). After negative aspiration for blood, 0.5 mL of the nerve block solution was injected without difficulty or complication. The needle was removed intact.  Once the entire procedure was completed, the treated area was cleaned, making sure to leave some of the prepping solution back to take advantage of its long term bactericidal properties.         Illustration of the posterior view of the lumbar spine and the posterior neural structures. Laminae of L2 through S1 are labeled. DPRL5, dorsal primary ramus of L5; DPRS1, dorsal primary ramus of S1; DPR3, dorsal primary ramus of L3; FJ, facet (zygapophyseal) joint L3-L4; I, inferior articular process of L4; LB1, lateral branch of dorsal primary ramus of L1; IAB, inferior articular branches from L3 medial branch (supplies L4-L5 facet joint); IBP, intermediate branch plexus; MB3, medial branch of dorsal primary ramus of L3; NR3, third lumbar nerve root; S, superior articular process of L5; SAB, superior articular branches from L4 (supplies L4-5 facet joint also); TP3, transverse process of L3.  Vitals:   06/10/22 1029 06/10/22 1035 06/10/22 1045 06/10/22 1054  BP: 111/74 111/64 105/64 106/66  Pulse:      Resp: 15 16 18 16   Temp:      TempSrc:      SpO2: 100% 96% 97% 98%  Weight:      Height:         Start Time: 1020 hrs. End Time: 1029 hrs.  Imaging Guidance (Spinal):          Type of Imaging Technique: Fluoroscopy Guidance (Spinal) Indication(s): Assistance in needle guidance and placement for procedures requiring needle placement in or near specific anatomical locations not easily accessible without such assistance. Exposure Time: Please see nurses notes. Contrast: None used. Fluoroscopic Guidance: I was personally present during the use of fluoroscopy.  "Tunnel Vision Technique" used to obtain the best possible view of the target area. Parallax error corrected before commencing the procedure. "Direction-depth-direction" technique used to introduce the needle under continuous pulsed fluoroscopy. Once target was reached, antero-posterior, oblique, and lateral fluoroscopic projection used confirm needle placement in all planes. Images permanently stored in EMR. Interpretation: No contrast injected. I personally interpreted the imaging intraoperatively. Adequate needle placement confirmed in multiple planes. Permanent images saved into the patient's record.  Antibiotic Prophylaxis:   Anti-infectives (From admission, onward)    None      Indication(s): None identified  Post-operative Assessment:  Post-procedure Vital Signs:  Pulse/HCG Rate: 8762 Temp: 97.9 F (36.6 C) Resp: 16 BP: 106/66 SpO2: 98 %  EBL: None  Complications: No immediate post-treatment complications observed by team, or reported by patient.  Note: The patient tolerated the entire procedure well. A repeat set of vitals were taken after the procedure and the patient was kept under observation following institutional policy, for this type of procedure. Post-procedural neurological assessment was performed, showing return to baseline, prior to discharge. The patient was provided with post-procedure discharge instructions, including a section on how to identify potential problems. Should any problems arise concerning this procedure, the patient was given instructions to immediately contact , at any time, without hesitation. In any case, we plan to contact the patient by telephone for a follow-up status report regarding this interventional procedure.  Comments:  No additional relevant information.  Plan of Care  Orders:  Orders Placed This Encounter  Procedures   LUMBAR FACET(MEDIAL BRANCH NERVE BLOCK) MBNB    Scheduling Instructions:     Procedure: Lumbar facet block  (  AKA.: Lumbosacral medial branch nerve block)     Side: Bilateral     Level: L3-4 & L5-S1 Facets (L2, L3, L4, L5, & S1 Medial Branch Nerves)     Sedation: Patient's choice.     Timeframe: Today    Order Specific Question:   Where will this procedure be performed?    Answer:   [facility]   DG [facility] C-ARM 1-60 MIN NO REPORT    Intraoperative interpretation by procedural physician at [facility].    Standing Status:   Standing    Number of Occurrences:   1    Order Specific Question:   Reason for exam:    Answer:   Assistance in needle guidance and placement for procedures requiring needle placement in or near specific anatomical locations not easily accessible without such assistance.   Informed Consent Details: Physician/Practitioner Attestation; Transcribe to consent form and obtain patient signature    Nursing Order: Transcribe to consent form and obtain patient signature. Note: Always confirm laterality of pain with Ms. Angert, before procedure.    Order Specific Question:   Physician/Practitioner attestation of informed consent for procedure/surgical case    Answer:   I, the physician/practitioner, attest that I have discussed with the patient the benefits, risks, side effects, alternatives, likelihood of achieving goals and potential problems during recovery for the procedure that I have provided informed consent.    Order Specific Question:   Procedure    Answer:   Lumbar Facet Block  under fluoroscopic guidance    Order Specific Question:   Physician/Practitioner performing the procedure    Answer:   Bailee Thall A. Laban Emperor MD    Order Specific Question:   Indication/Reason    Answer:   Low Back Pain, with our without leg pain, due to Facet Joint Arthralgia (Joint Pain) Spondylosis (Arthritis of the Spine), without myelopathy or radiculopathy (Nerve Damage).   Provide equipment / supplies at bedside    "Block Tray" (Disposable  single use) Needle type:  SpinalSpinal Amount/quantity: 4 Size: Regular (3.5-inch) Gauge: 22G    Standing Status:   Standing    Number of Occurrences:   1    Order Specific Question:   Specify    Answer:   Block Tray   Chronic Opioid Analgesic:  No chronic opioid analgesics therapy prescribed by our practice. See 01/08/22 UDS. (+) THC. MME/day: 0 mg/day   Medications ordered for procedure: Meds ordered this encounter  Medications   lidocaine (XYLOCAINE) 2 % (with pres) injection 400 mg   pentafluoroprop-tetrafluoroeth (GEBAUERS) aerosol   lactated ringers infusion   midazolam (VERSED) 5 MG/5ML injection 0.5-2 mg    Make sure Flumazenil is available in the pyxis when using this medication. If oversedation occurs, administer 0.2 mg IV over 15 sec. If after 45 sec no response, administer 0.2 mg again over 1 min; may repeat at 1 min intervals; not to exceed 4 doses (1 mg)   fentaNYL (SUBLIMAZE) injection 25-50 mcg    Make sure Narcan is available in the pyxis when using this medication. In the event of respiratory depression (RR< 8/min): Titrate NARCAN (naloxone) in increments of 0.1 to 0.2 mg IV at 2-3 minute intervals, until desired degree of reversal.   ropivacaine (PF) 2 mg/mL (0.2%) (NAROPIN) injection 18 mL   triamcinolone acetonide (KENALOG-40) injection 80 mg   Medications administered: We administered lidocaine, pentafluoroprop-tetrafluoroeth, lactated ringers, midazolam, fentaNYL, ropivacaine (PF) 2 mg/mL (0.2%), and triamcinolone acetonide.  See the medical record for exact  dosing, route, and time of administration.  Follow-up plan:   Return in about 2 weeks (around 06/24/2022) for Proc-day (T,Th), (VV), (PPE).       Interventional Therapies  Risk  Complexity Considerations:   Estimated body mass index is 34.06 kg/m as calculated from the following:   Height as of this encounter: 5\' 6"  (1.676 m).   Weight as of this encounter: 211 lb (95.7 kg). WNL      Planned  Pending:      Under  consideration:   Diagnostic bilateral lumbar facet MBB #2  Diagnostic left L4-5 LESI #1  Diagnostic left L5 and S1 TFESI #1  Diagnostic bilateral SI joint Blk #1  Diagnostic bilateral IA hip joint inj. #1  Diagnostic bilateral thoracic facet MBB #1  Diagnostic bilateral cervical facet MBB #1  Diagnostic bilateral IA shoulder joint inj. #1  Diagnostic bilateral carpal tunnel syndrome inj. #1   Completed:   Diagnostic bilateral lumbar facet MBB x1 (04/01/2022) (100/100/100/100)    Completed by other providers:   Nerve block by [facility] in [facility]   Therapeutic  Palliative (PRN) options:   None established        Recent Visits Date Type Provider Dept  04/28/22 Office Visit 04/30/22, MD [facility]  04/01/22 Procedure visit 04/03/22, MD [facility]  Showing recent visits within past 90 days and meeting all other requirements Today's Visits Date Type Provider Dept  06/10/22 Procedure visit 06/12/22, MD [facility]  Showing today's visits and meeting all other requirements Future Appointments Date Type Provider Dept  06/24/22 Appointment 08/24/22, MD [facility]  Showing future appointments within next 90 days and meeting all other requirements  Disposition: Discharge home  Discharge (Date  Time): 06/10/2022;   hrs.   Primary Care Physician: 06/12/2022, NP Location: [facility] Note by: [facility], MD Date: 06/10/2022; Time: 10:56 AM  Disclaimer:  Medicine is not an exact science. The only guarantee in medicine is that nothing is guaranteed. It is important to note that the decision to proceed with this intervention was based on the information collected from the patient. The Data and conclusions were drawn from the patient's questionnaire, the interview, and the physical examination. Because the information was provided in large part by the patient, it  cannot be guaranteed that it has not been purposely or unconsciously manipulated. Every effort has been made to obtain as much relevant data as possible for this evaluation. It is important to note that the conclusions that lead to this procedure are derived in large part from the available data. Always take into account that the treatment will also be dependent on availability of resources and existing treatment guidelines, considered by other Pain Management Practitioners as being common knowledge and practice, at the time of the intervention. For Medico-Legal purposes, it is also important to point out that variation in procedural techniques and pharmacological choices are the acceptable norm. The indications, contraindications, technique, and results of the above procedure should only be interpreted and judged by a Board-Certified Interventional Pain Specialist with extensive familiarity and expertise in the same exact procedure and technique.

## 2022-06-10 NOTE — Patient Instructions (Signed)
____________________________________________________________________________________________  Virtual Visits   What is a "Virtual Visit"? It is a healthcare communication encounter (medical visit) that takes place on real time (NOT TEXT or E-MAIL) over the telephone or computer device (desktop, laptop, tablet, smart phone, etc.). It allows for more location flexibility between the patient and the healthcare provider.  Who decides when these types of visits will be used? The physician.  Who is eligible for these types of visits? Only those patients that can be reliably reached over the telephone.  What do you mean by reliably? We do not have time to call everyone multiple times, therefore those that tend to screen calls and then call back later are not suitable candidates for this system. We understand how people are reluctant to pickup on "unknown" calls, therefore, we suggest adding our telephone numbers to your list of "CONTACT(s)". This way, you should be able to readily identify our calls when you receive one. All of our numbers are available below.   Who is not eligible? This option is not available for medication management encounters, specially for controlled substances. Patients on pain medications that fall under the category of controlled substances have to come in for "Face-to-Face" encounters. This is required for mandatory monitoring of these substances. You may be asked to provide a sample for an unannounced urine drug screening test (UDS), and we will need to count your pain pills. Not bringing your pills to be counted may result in no refill. Obviously, neither one of these can be done over the phone.  When will this type of visits be used? You can request a virtual visit whenever you are physically unable to attend a regular appointment. The decision will be made by the physician (or healthcare provider) on a case by case basis.   At what time will I be called? This is an  excellent question. The providers will try to call you whenever they have time available. Do not expect to be called at any specific time. The secretaries will assign you a time for your virtual visit appointment, but this is done simply to keep a list of those patients that need to be called, but not for the purpose of keeping a time schedule. Be advised that the call may come in anytime during the day, between the hours of 8:00 AM and 8::00 PM, depending on provider availability. We do understand that the system is not perfect. If you are unable to be available that day on a moments notice, then request an "in-person" appointment rather than a "virtual visit".  Can I request my medication visits to be "Virtual"? Yes you may request it, but the decision is entirely up to the healthcare provider. Control substances require specific monitoring that requires Face-to-Face encounters. The number of encounters  and the extent of the monitoring is determined on a case by case basis.  Add a new contact to your smart phone and label it "PAIN CLINIC" Under this contact add the following numbers: Main: [phone] (Official Contact Number) Nurses: [phone] (These are outgoing only calling systems. Do not call this number.) Dr. Rease Swinson: [phone] or [phone] (Outgoing calls only. Do not call this number.)  ____________________________________________________________________________________________    ____________________________________________________________________________________________  Post-Procedure Discharge Instructions  Instructions: Apply ice:  Purpose: This will minimize any swelling and discomfort after procedure.  When: Day of procedure, as soon as you get home. How: Fill a plastic sandwich bag with crushed ice. Cover it with a small towel and apply   to injection site. How long: (15 min on, 15 min off) Apply for 15 minutes then remove x 15 minutes.  Repeat sequence on  day of procedure, until you go to bed. Apply heat:  Purpose: To treat any soreness and discomfort from the procedure. When: Starting the next day after the procedure. How: Apply heat to procedure site starting the day following the procedure. How long: May continue to repeat daily, until discomfort goes away. Food intake: Start with clear liquids (like water) and advance to regular food, as tolerated.  Physical activities: Keep activities to a minimum for the first 8 hours after the procedure. After that, then as tolerated. Driving: If you have received any sedation, be responsible and do not drive. You are not allowed to drive for 24 hours after having sedation. Blood thinner: (Applies only to those taking blood thinners) You may restart your blood thinner 6 hours after your procedure. Insulin: (Applies only to Diabetic patients taking insulin) As soon as you can eat, you may resume your normal dosing schedule. Infection prevention: Keep procedure site clean and dry. Shower daily and clean area with soap and water. Post-procedure Pain Diary: Extremely important that this be done correctly and accurately. Recorded information will be used to determine the next step in treatment. For the purpose of accuracy, follow these rules: Evaluate only the area treated. Do not report or include pain from an untreated area. For the purpose of this evaluation, ignore all other areas of pain, except for the treated area. After your procedure, avoid taking a long nap and attempting to complete the pain diary after you wake up. Instead, set your alarm clock to go off every hour, on the hour, for the initial 8 hours after the procedure. Document the duration of the numbing medicine, and the relief you are getting from it. Do not go to sleep and attempt to complete it later. It will not be accurate. If you received sedation, it is likely that you were given a medication that may cause amnesia. Because of this,  completing the diary at a later time may cause the information to be inaccurate. This information is needed to plan your care. Follow-up appointment: Keep your post-procedure follow-up evaluation appointment after the procedure (usually 2 weeks for most procedures, 6 weeks for radiofrequencies). DO NOT FORGET to bring you pain diary with you.   Expect: (What should I expect to see with my procedure?) From numbing medicine (AKA: Local Anesthetics): Numbness or decrease in pain. You may also experience some weakness, which if present, could last for the duration of the local anesthetic. Onset: Full effect within 15 minutes of injected. Duration: It will depend on the type of local anesthetic used. On the average, 1 to 8 hours.  From steroids (Applies only if steroids were used): Decrease in swelling or inflammation. Once inflammation is improved, relief of the pain will follow. Onset of benefits: Depends on the amount of swelling present. The more swelling, the longer it will take for the benefits to be seen. In some cases, up to 10 days. Duration: Steroids will stay in the system x 2 weeks. Duration of benefits will depend on multiple posibilities including persistent irritating factors. Side-effects: If present, they may typically last 2 weeks (the duration of the steroids). Frequent: Cramps (if they occur, drink Gatorade and take over-the-counter Magnesium 450-500 mg once to twice a day); water retention with temporary weight gain; increases in blood sugar; decreased immune system response; increased appetite. Occasional: Facial flushing (  red, warm cheeks); mood swings; menstrual changes. Uncommon: Long-term decrease or suppression of natural hormones; bone thinning. (These are more common with higher doses or more frequent use. This is why we prefer that our patients avoid having any injection therapies in other practices.)  Very Rare: Severe mood changes; psychosis; aseptic necrosis. From  procedure: Some discomfort is to be expected once the numbing medicine wears off. This should be minimal if ice and heat are applied as instructed.  Call if: (When should I call?) You experience numbness and weakness that gets worse with time, as opposed to wearing off. New onset bowel or bladder incontinence. (Applies only to procedures done in the spine)  Emergency Numbers: Durning business hours (Monday - Thursday, 8:00 AM - 4:00 PM) (Friday, 9:00 AM - 12:00 Noon): [phone] After hours: [phone] NOTE: If you are having a problem and are unable connect with, or to talk to a provider, then go to your nearest urgent care or emergency department. If the problem is serious and urgent, please call 911. ____________________________________________________________________________________________    

## 2022-06-11 ENCOUNTER — Telehealth: Payer: Self-pay

## 2022-06-11 NOTE — Telephone Encounter (Signed)
Post procedure phone call. Patient states she is doing good.  

## 2022-06-19 DIAGNOSIS — S22000A Wedge compression fracture of unspecified thoracic vertebra, initial encounter for closed fracture: Secondary | ICD-10-CM | POA: Diagnosis not present

## 2022-06-19 DIAGNOSIS — R32 Unspecified urinary incontinence: Secondary | ICD-10-CM | POA: Diagnosis not present

## 2022-06-22 NOTE — Progress Notes (Unsigned)
Patient: Kim [name]  Service Category: E/M  Provider: Gaspar Cola, MD  DOB: September 19, 1965  DOS: 06/24/2022  Location: Office  MRN: [id number]  Setting: Ambulatory outpatient  Referring Provider: [name], NP  Type: Established Patient  Specialty: Interventional Pain Management  PCP: [name], NP  Location: Remote location  Delivery: TeleHealth     Virtual Encounter - Pain Management PROVIDER NOTE: Information contained herein reflects review and annotations entered in association with encounter. Interpretation of such information and data should be left to medically-trained personnel. Information provided to patient can be located elsewhere in the medical record under "Patient Instructions". Document created using STT-dictation technology, any transcriptional errors that may result from process are unintentional.    Contact & Pharmacy Preferred: [phone] Home: [phone] (home) Mobile: [phone] (mobile) E-mail: [email or web address] [phone] - [address], [address] AT [address] [place] [address] Phone: [phone] Fax: [phone]  [place], [address] AT [address] [place] [address] Phone: [phone] Fax: [phone]   Pre-screening  Ms. Simonich offered "in-person" vs "virtual" encounter. She indicated preferring virtual for this encounter.   Reason COVID-19*  Social distancing based on CDC and AMA recommendations.   I contacted Kim Crane Albers on 06/24/2022 via telephone.      I clearly identified myself as Gaspar Cola, MD. I verified that I was speaking with the correct person using two identifiers (Name: Kim [name], and date of birth: Jun 03, 1966).  Consent I sought verbal advanced consent from Kim [name] for virtual visit interactions. I informed Kim [name]  of possible security and privacy concerns, risks, and limitations associated with providing "not-in-person" medical evaluation and management services. I also informed Kim [name] of the availability of "in-person" appointments. Finally, I informed her that there would be a charge for the virtual visit and that she could be  personally, fully or partially, financially responsible for it. Kim [name] expressed understanding and agreed to proceed.   Historic Elements   Kim [name] is a 56 y.o. year old, female patient evaluated today after our last contact on 06/10/2022. Kim [name]  has a past medical history of Arthritis, Bipolar disorder ([place]), COPD (chronic obstructive pulmonary disease) ([place]), GERD (gastroesophageal reflux disease), Leg pain, Migraine, and Restless leg syndrome. She also  has a past surgical history that includes Appendectomy; Total abdominal hysterectomy w/ bilateral salpingoophorectomy; Colostomy revision (2014); Carpal tunnel release; and breast surgey. Ms. Campise has a current medication list which includes the following prescription(s): aspirin-salicylamide-caffeine, fluticasone, ondansetron, pregabalin, ramelteon, ropinirole, rosuvastatin, and sumatriptan. She  reports that she has been smoking cigarettes. She has a 156.00 pack-year smoking history. She has never used smokeless tobacco. She reports current alcohol use of about 42.0 standard drinks of alcohol per week. She reports that she does not currently use drugs after having used the following drugs: Marijuana. Kim [name] is allergic to penicillins and sulfa antibiotics.   HPI  Today, she is being contacted for a post-procedure assessment.  Post-procedure evaluation   Type: Lumbar Facet, Medial Branch Block(s) #2  Laterality: Bilateral  Level: L2, L3, L4, L5, & S1 Medial Branch Level(s). Injecting these levels blocks the L3-4, L4-5 and L5-S1 lumbar facet joints. Imaging: Fluoroscopic guidance         Anesthesia:  Local anesthesia (1-2% Lidocaine) Anxiolysis: IV  Versed 3.0 mg Sedation: Moderate Sedation Fentanyl 2.0 mL (100 mcg) DOS: 06/10/2022 Performed by: Gaspar Cola, MD  Primary Purpose: Diagnostic/Therapeutic Indications: Low back pain severe enough to impact quality of life or function. 1. Lumbar facet syndrome (Bilateral)   2. Grade 1 Retrolisthesis of L2/L3 and L3/L4   3. Spondylosis without myelopathy or radiculopathy, lumbosacral region   4. DDD (degenerative disc disease), lumbosacral   5. Chronic low back pain (1ry area of Pain) (Bilateral) (L>R) w/o sciatica   6. Abnormal MRI, lumbar spine (10/28/2021)    NAS-11 Pain score:   Pre-procedure: 9 /10   Post-procedure: 0-No pain/10      Effectiveness:  Initial hour after procedure:   ***. Subsequent 4-6 hours post-procedure:   ***. Analgesia past initial 6 hours:   ***. Ongoing improvement:  Analgesic:  *** Function:    ***    ROM:    ***     Pharmacotherapy Assessment   Opioid Analgesic: No chronic opioid analgesics therapy prescribed by our practice. See 01/08/22 UDS. (+) THC. MME/day: 0 mg/day   Monitoring: [place] PMP: PDMP reviewed during this encounter.       Pharmacotherapy: No side-effects or adverse reactions reported. Compliance: No problems identified. Effectiveness: Clinically acceptable. Plan: Refer to "POC". UDS:  Summary  Date Value Ref Range Status  01/08/2022 Note  Final    Comment:    ==================================================================== Compliance Drug Analysis, Ur ==================================================================== Test                             Result       Flag       Units  Drug Present not Declared for Prescription Verification   Carboxy-THC                    46           UNEXPECTED ng/mg creat    Carboxy-THC is a metabolite of tetrahydrocannabinol (THC). Source of    THC is most commonly herbal marijuana or marijuana-based products,    but THC is also  present in a scheduled prescription medication.    Trace amounts of THC can be present in hemp and cannabidiol (CBD)    products. This test is not intended to distinguish between delta-9-    tetrahydrocannabinol, the predominant form of THC in most herbal or    marijuana-based products, and delta-8-tetrahydrocannabinol.  Drug Absent but Declared for Prescription Verification   Salicylate                     Not Detected UNEXPECTED    Salicylate, as indicated in the declared medication list, is not    always detected even when used as directed.     Aspirin, as indicated in the declared medication list, is not always    detected even when used as directed.  ==================================================================== Test                      Result    Flag   Units      Ref Range   Creatinine              125              mg/dL      >=20 ==================================================================== Declared Medications:  The flagging and interpretation on this report are based on the  following declared medications.  Unexpected results may arise from  inaccuracies in the declared medications.   **Note: The testing scope of this panel does not include small to  moderate amounts of these reported medications:   Aspirin  Salicylate (Salicylamide)   **Note: The testing scope of this panel does not include the  following reported medications:   Caffeine  Fluticasone (Flonase)  Ondansetron (Zofran)  Ramelteon (Rozerem)  Ropinirole (Requip)  Rosuvastatin (Crestor)  Sumatriptan ==================================================================== For clinical consultation, please call [phone]. ====================================================================    No results found for: "CBDTHCR", "D8THCCBX", "D9THCCBX"   Laboratory Chemistry Profile   Renal Lab Results  Component Value Date   BUN 8 05/26/2022   CREATININE 0.71 05/26/2022   BCR 11  05/26/2022   GFRAA >60 12/06/2015   GFRNONAA >60 12/06/2015    Hepatic Lab Results  Component Value Date   AST 31 05/26/2022   ALT 25 05/26/2022   ALBUMIN 4.5 05/26/2022   ALKPHOS 122 (H) 05/26/2022   LIPASE 24 12/06/2015    Electrolytes Lab Results  Component Value Date   NA 141 05/26/2022   K 4.2 05/26/2022   CL 103 05/26/2022   CALCIUM 9.6 05/26/2022   MG 2.1 01/08/2022   PHOS 4.4 10/05/2012    Bone Lab Results  Component Value Date   25OHVITD1 35 01/08/2022   25OHVITD2 <1.0 01/08/2022   25OHVITD3 35 01/08/2022    Inflammation (CRP: Acute Phase) (ESR: Chronic Phase) Lab Results  Component Value Date   CRP 2 01/08/2022   ESRSEDRATE 22 01/08/2022         Note: Above Lab results reviewed.  Imaging  DG PAIN CLINIC C-ARM 1-60 MIN NO REPORT Fluoro was used, but no Radiologist interpretation will be provided.  Please refer to "NOTES" tab for provider progress note.  Assessment  There were no encounter diagnoses.  Plan of Care  Problem-specific:  No problem-specific Assessment & Plan notes found for this encounter.  Kim [name] has a current medication list which includes the following long-term medication(s): fluticasone, pregabalin, ramelteon, ropinirole, rosuvastatin, and sumatriptan.  Pharmacotherapy (Medications Ordered): No orders of the defined types were placed in this encounter.  Orders:  No orders of the defined types were placed in this encounter.  Follow-up plan:   No follow-ups on file.     Interventional Therapies  Risk  Complexity Considerations:   Estimated body mass index is 34.06 kg/m as calculated from the following:   Height as of this encounter: 5' 6" (1.676 m).   Weight as of this encounter: 211 lb (95.7 kg). WNL      Planned  Pending:      Under consideration:   Diagnostic bilateral lumbar facet MBB #2  Diagnostic left L4-5 LESI #1  Diagnostic left L5 and S1 TFESI #1  Diagnostic bilateral SI joint Blk #1   Diagnostic bilateral IA hip joint inj. #1  Diagnostic bilateral thoracic facet MBB #1  Diagnostic bilateral cervical facet MBB #1  Diagnostic bilateral IA shoulder joint inj. #1  Diagnostic bilateral carpal tunnel syndrome inj. #1   Completed:   Diagnostic bilateral lumbar facet MBB x1 (04/01/2022) (100/100/100/100)    Completed by other providers:   Nerve block by [facility] in [facility]   Therapeutic  Palliative (PRN) options:   None established         Recent Visits Date Type Provider Dept  06/10/22 Procedure visit Milinda Pointer, MD [facility]  04/28/22 Office Visit Milinda Pointer, MD [facility]  04/01/22 Procedure visit Milinda Pointer, MD [facility]  Showing recent visits within past 90 days and meeting all other requirements Future Appointments Date Type Provider Dept  06/24/22 Appointment Milinda Pointer, MD [facility]  Showing future appointments within next 90 days and meeting all other requirements  I discussed the assessment and treatment plan with the patient. The patient was provided an opportunity to ask questions and all were answered. The patient agreed with the plan and demonstrated an understanding of the instructions.  Patient advised to call back or seek an in-person evaluation if the symptoms or condition worsens.  Duration of encounter: *** minutes.  Note by: Gaspar Cola, MD Date: 06/24/2022; Time: 4:42 PM

## 2022-06-24 ENCOUNTER — Encounter: Payer: Self-pay | Admitting: Nurse Practitioner

## 2022-06-24 ENCOUNTER — Ambulatory Visit: Payer: Medicaid Other | Attending: Pain Medicine | Admitting: Pain Medicine

## 2022-06-24 DIAGNOSIS — M47816 Spondylosis without myelopathy or radiculopathy, lumbar region: Secondary | ICD-10-CM | POA: Diagnosis not present

## 2022-06-24 DIAGNOSIS — G8929 Other chronic pain: Secondary | ICD-10-CM

## 2022-06-24 DIAGNOSIS — M25552 Pain in left hip: Secondary | ICD-10-CM | POA: Diagnosis not present

## 2022-06-24 DIAGNOSIS — M4310 Spondylolisthesis, site unspecified: Secondary | ICD-10-CM

## 2022-06-24 DIAGNOSIS — M545 Low back pain, unspecified: Secondary | ICD-10-CM | POA: Diagnosis not present

## 2022-06-24 DIAGNOSIS — M79605 Pain in left leg: Secondary | ICD-10-CM

## 2022-06-24 DIAGNOSIS — M25551 Pain in right hip: Secondary | ICD-10-CM | POA: Diagnosis not present

## 2022-06-24 NOTE — Progress Notes (Deleted)
There were no vitals taken for this visit.   Subjective:    Patient ID: Kim [name], female    DOB: [birth year], 56 y.o.   MRN: [id number]  HPI: Kim [name] is a 56 y.o. female  No chief complaint on file.  PAIN Patient states she has her shots scheduled with pain management for tomorrow.  Patient states she is having pain in her legs and arms.  Pain has worsened over the last several years.  She has been taking the Requip since 2007 and it is not helping her pain.  She gets the pain in her arms and feels like they are nerve jerking.  She has tried Lyrica in the past and states that it helped her pain.  She would like to restart the Lyrica.  Relevant past medical, surgical, family and social history reviewed and updated as indicated. Interim medical history since our last visit reviewed. Allergies and medications reviewed and updated.  Review of Systems  Musculoskeletal:  Positive for myalgias.    Per HPI unless specifically indicated above     Objective:    There were no vitals taken for this visit.  Wt Readings from Last 3 Encounters:  06/10/22 189 lb (85.7 kg)  05/26/22 189 lb 9.6 oz (86 kg)  04/28/22 204 lb (92.5 kg)    Physical Exam Vitals and nursing note reviewed.  HENT:     Head: Normocephalic.     Right Ear: Hearing normal.     Left Ear: Hearing normal.     Nose: Nose normal.  Eyes:     Pupils: Pupils are equal, round, and reactive to light.  Pulmonary:     Effort: Pulmonary effort is normal. No respiratory distress.  Neurological:     Mental Status: She is alert.  Psychiatric:        Mood and Affect: Mood normal.        Behavior: Behavior normal.        Thought Content: Thought content normal.        Judgment: Judgment normal.    Results for orders placed or performed in visit on 05/26/22  Rheumatoid factor  Result Value Ref Range   Rhuematoid fact SerPl-aCnc <10.0 <14.0 IU/mL  ANA  Result Value Ref Range   Anti Nuclear Antibody  (ANA) Negative Negative  ANA+ENA+DNA/DS+Antich+Centr  Result Value Ref Range   ANA Titer 1 Negative    dsDNA Ab <1 0 - 9 IU/mL   ENA RNP Ab <0.2 0.0 - 0.9 AI   ENA SM Ab Ser-aCnc <0.2 0.0 - 0.9 AI   Scleroderma (Scl-70) (ENA) Antibody, IgG <0.2 0.0 - 0.9 AI   ENA SSA (RO) Ab <0.2 0.0 - 0.9 AI   ENA SSB (LA) Ab 0.3 0.0 - 0.9 AI   Chromatin Ab SerPl-aCnc <0.2 0.0 - 0.9 AI   Anti JO-1 <0.2 0.0 - 0.9 AI   Centromere Ab Screen <0.2 0.0 - 0.9 AI   See below: Comment   CK  Result Value Ref Range   Total CK 118 32 - 182 U/L  Rocky mtn spotted fvr abs pnl(IgG+IgM)  Result Value Ref Range   RMSF IgG Negative Negative   RMSF IgM 0.45 0.00 - 0.89 index  Uric acid  Result Value Ref Range   Uric Acid 5.7 3.0 - 7.2 mg/dL  Ehrlichia antibody panel  Result Value Ref Range   E.Chaffeensis (HME) IgG Negative Neg:<1:64   E. Chaffeensis (HME) IgM Titer Negative Neg:<1:20   HGE  IgG Titer Negative Neg:<1:64   HGE IgM Titer Negative Neg:<1:20   Result Comment: Comment   Babesia microti Antibody Panel  Result Value Ref Range   Babesia microti IgG <1:10 Neg:<1:10   Babesia microti IgM <1:10 Neg:<1:10   Result Comment: Comment   Comprehensive metabolic panel  Result Value Ref Range   Glucose 90 70 - 99 mg/dL   BUN 8 6 - 24 mg/dL   Creatinine, Ser 0.71 0.57 - 1.00 mg/dL   eGFR 100 >59 mL/min/1.73   BUN/Creatinine Ratio 11 9 - 23   Sodium 141 134 - 144 mmol/L   Potassium 4.2 3.5 - 5.2 mmol/L   Chloride 103 96 - 106 mmol/L   CO2 21 20 - 29 mmol/L   Calcium 9.6 8.7 - 10.2 mg/dL   Total Protein 7.0 6.0 - 8.5 g/dL   Albumin 4.5 3.8 - 4.9 g/dL   Globulin, Total 2.5 1.5 - 4.5 g/dL   Albumin/Globulin Ratio 1.8 1.2 - 2.2   Bilirubin Total 0.2 0.0 - 1.2 mg/dL   Alkaline Phosphatase 122 (H) 44 - 121 IU/L   AST 31 0 - 40 IU/L   ALT 25 0 - 32 IU/L  CBC with Differential/Platelet  Result Value Ref Range   WBC 7.6 3.4 - 10.8 x10E3/uL   RBC 4.72 3.77 - 5.28 x10E6/uL   Hemoglobin 15.8 11.1 - 15.9 g/dL    Hematocrit 45.3 34.0 - 46.6 %   MCV 96 79 - 97 fL   MCH 33.5 (H) 26.6 - 33.0 pg   MCHC 34.9 31.5 - 35.7 g/dL   RDW 13.3 11.7 - 15.4 %   Platelets 268 150 - 450 x10E3/uL   Neutrophils 71 Not Estab. %   Lymphs 21 Not Estab. %   Monocytes 6 Not Estab. %   Eos 1 Not Estab. %   Basos 1 Not Estab. %   Neutrophils Absolute 5.4 1.4 - 7.0 x10E3/uL   Lymphocytes Absolute 1.6 0.7 - 3.1 x10E3/uL   Monocytes Absolute 0.5 0.1 - 0.9 x10E3/uL   EOS (ABSOLUTE) 0.1 0.0 - 0.4 x10E3/uL   Basophils Absolute 0.1 0.0 - 0.2 x10E3/uL   Immature Granulocytes 0 Not Estab. %   Immature Grans (Abs) 0.0 0.0 - 0.1 x10E3/uL      Assessment & Plan:   Problem List Items Addressed This Visit   None    Follow up plan: No follow-ups on file.   This visit was completed via MyChart due to the restrictions of the COVID-19 pandemic. All issues as above were discussed and addressed. Physical exam was done as above through visual confirmation on MyChart. If it was felt that the patient should be evaluated in the office, they were directed there. The patient verbally consented to this visit. Location of the patient: Home Location of the provider: Office Those involved with this call:  Provider: [name], NP CMA: Jodie Echevaria, CMA Front Desk/Registration: Lynnell Catalan This encounter was conducted via video.  I spent 30 dedicated to the care of this patient on the date of this encounter to include previsit review of medications, symptoms, and follow up, face to face time with the patient, and post visit ordering of testing.

## 2022-06-25 ENCOUNTER — Ambulatory Visit: Payer: Medicaid Other | Admitting: Nurse Practitioner

## 2022-07-16 DIAGNOSIS — R32 Unspecified urinary incontinence: Secondary | ICD-10-CM | POA: Diagnosis not present

## 2022-07-16 DIAGNOSIS — S22000A Wedge compression fracture of unspecified thoracic vertebra, initial encounter for closed fracture: Secondary | ICD-10-CM | POA: Diagnosis not present

## 2022-07-24 ENCOUNTER — Other Ambulatory Visit: Payer: Self-pay | Admitting: Nurse Practitioner

## 2022-07-24 NOTE — Telephone Encounter (Signed)
Requested Prescriptions  Pending Prescriptions Disp Refills   rOPINIRole (REQUIP) 4 MG tablet [Pharmacy Med Name: ROPINIROLE 4MG  TABLETS] 450 tablet 3    Sig: TAKE 1 TABLET BY MOUTH 5 TIMES DAILY     Neurology:  Parkinsonian Agents Passed - 07/24/2022  3:26 AM      Passed - Last BP in normal range    BP Readings from Last 1 Encounters:  06/10/22 106/66         Passed - Last Heart Rate in normal range    Pulse Readings from Last 1 Encounters:  06/10/22 87         Passed - Valid encounter within last 12 months    Recent Outpatient Visits           1 month ago RLS (restless legs syndrome)   [facility], NP   1 month ago Aortic atherosclerosis (HCC)   [facility], NP   5 months ago Blunt trauma to chest, initial encounter   [facility] Mecum, Erin E, PA-C   8 months ago Chronic bilateral low back pain with sciatica, sciatica laterality unspecified   [facility], NP   1 year ago Chronic bilateral low back pain with sciatica, sciatica laterality unspecified   [facility], NP

## 2022-08-23 DIAGNOSIS — R32 Unspecified urinary incontinence: Secondary | ICD-10-CM | POA: Diagnosis not present

## 2022-08-23 DIAGNOSIS — S22000A Wedge compression fracture of unspecified thoracic vertebra, initial encounter for closed fracture: Secondary | ICD-10-CM | POA: Diagnosis not present

## 2022-09-15 ENCOUNTER — Encounter: Payer: Self-pay | Admitting: Nurse Practitioner

## 2022-09-15 DIAGNOSIS — R32 Unspecified urinary incontinence: Secondary | ICD-10-CM | POA: Diagnosis not present

## 2022-09-15 DIAGNOSIS — S22000A Wedge compression fracture of unspecified thoracic vertebra, initial encounter for closed fracture: Secondary | ICD-10-CM | POA: Diagnosis not present

## 2022-09-16 ENCOUNTER — Other Ambulatory Visit: Payer: Self-pay | Admitting: Nurse Practitioner

## 2022-09-17 ENCOUNTER — Encounter: Payer: Self-pay | Admitting: Nurse Practitioner

## 2022-09-17 ENCOUNTER — Ambulatory Visit: Payer: Medicaid Other | Admitting: Nurse Practitioner

## 2022-09-17 NOTE — Telephone Encounter (Signed)
Requested medication (s) are due for refill today: routing for approval  Requested medication (s) are on the active medication list: yes  Last refill:  06/09/22  Future visit scheduled: no  Notes to clinic:  Unable to refill per protocol, cannot delegate.      Requested Prescriptions  Pending Prescriptions Disp Refills   pregabalin (LYRICA) 50 MG capsule [Pharmacy Med Name: PREGABALIN 50MG  CAPSULES] 60 capsule     Sig: TAKE 1 CAPSULE(50 MG) BY MOUTH TWICE DAILY     Not Delegated - Neurology:  Anticonvulsants - Controlled - pregabalin Failed - 09/16/2022 10:38 AM      Failed - This refill cannot be delegated      Passed - Cr in normal range and within 360 days    Creatinine  Date Value Ref Range Status  10/13/2014 0.79 0.60 - 1.30 mg/dL Final   Creatinine, Ser  Date Value Ref Range Status  05/26/2022 0.71 0.57 - 1.00 mg/dL Final         Passed - Completed PHQ-2 or PHQ-9 in the last 360 days      Passed - Valid encounter within last 12 months    Recent Outpatient Visits           3 months ago RLS (restless legs syndrome)   [facility] [name], NP   3 months ago Aortic atherosclerosis ([place])   [facility] [name], NP   7 months ago Blunt trauma to chest, initial encounter   [place], Erin E, PA-C   10 months ago Chronic bilateral low back pain with sciatica, sciatica laterality unspecified   [facility] [name], NP   1 year ago Chronic bilateral low back pain with sciatica, sciatica laterality unspecified   [facility] [name], NP               SUMAtriptan 6 MG/0.5ML SOAJ [Pharmacy Med Name: SUMATRIPTAN 6MG /0.5ML PF INJ2X0.5ML] 10 mL 1    Sig: INJECT 0.5 ML INTO SKIN AT ONSET OF HEADACHE, MAY REPEAT AFTER 1 HOUR IF NO BETTER. MAXIMUM 2 DOSES IN 24 HOURS     Neurology:  Migraine Therapy - Triptan Passed - 09/16/2022 10:38 AM      Passed - Last BP in  normal range    BP Readings from Last 1 Encounters:  06/10/22 106/66         Passed - Valid encounter within last 12 months    Recent Outpatient Visits           3 months ago RLS (restless legs syndrome)   [facility] [name], NP   3 months ago Aortic atherosclerosis ([place])   [facility] [name], NP   7 months ago Blunt trauma to chest, initial encounter   [place], Erin E, PA-C   10 months ago Chronic bilateral low back pain with sciatica, sciatica laterality unspecified   [facility] [name], NP   1 year ago Chronic bilateral low back pain with sciatica, sciatica laterality unspecified   [facility] [name], NP

## 2022-09-18 ENCOUNTER — Telehealth: Payer: Self-pay

## 2022-09-18 NOTE — Telephone Encounter (Signed)
PA for Sumatriptan Succinate initiated and submitted via Cover My Meds. Key: [id number]

## 2022-09-27 ENCOUNTER — Encounter: Payer: Self-pay | Admitting: Nurse Practitioner

## 2022-10-16 DIAGNOSIS — R32 Unspecified urinary incontinence: Secondary | ICD-10-CM | POA: Diagnosis not present

## 2022-10-16 DIAGNOSIS — S22000A Wedge compression fracture of unspecified thoracic vertebra, initial encounter for closed fracture: Secondary | ICD-10-CM | POA: Diagnosis not present

## 2022-10-19 NOTE — Patient Instructions (Signed)

## 2022-10-19 NOTE — Progress Notes (Unsigned)
PROVIDER NOTE: Information contained herein reflects review and annotations entered in association with encounter. Interpretation of such information and data should be left to medically-trained personnel. Information provided to patient can be located elsewhere in the medical record under "Patient Instructions". Document created using STT-dictation technology, any transcriptional errors that may result from process are unintentional.    Patient: Kim [name]  Service Category: E/M  Provider: Gaspar Cola, MD  DOB: [birth year]  DOS: 10/20/2022  Referring Provider: [name], NP  MRN: [id number]  Specialty: Interventional Pain Management  PCP: [name], NP  Type: Established Patient  Setting: Ambulatory outpatient    Location: Office  Delivery: Face-to-face     HPI  [name], a 57 y.o. year old female, is here today because of her Chronic bilateral low back pain without sciatica [M54.50, G89.29]. [name] primary complain today is No chief complaint on file. Last encounter: My last encounter with her was on 06/24/2022. Pertinent problems: 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) (Bilateral: L5-S1); Lumbar lateral recess stenosis (L3-4) (Left); Grade 1 Retrolisthesis of L2/L3 and L3/L4; Lumbar disc annular tears; Lumbosacral lateral recess stenosis (L3-4, L5-S1) (Left); Chronic  hip pain (2ry area of Pain) (Bilateral) (L>R); Chronic lower extremity pain (3ry area of Pain) (Intermittent) (Left); Chronic thoracic back pain (4th area of Pain) (Midline) (Bilateral); Chronic neck and back pain (5th area of Pain) (Midline); Cervicalgia; Cervicogenic headache; Occipital headache (Bilateral); Chronic shoulder pain (6th area of Pain) (Bilateral); Carpal tunnel syndrome (Bilateral); Numbness and tingling in hands (Bilateral); Lumbar facet syndrome (Bilateral); Thoracic facet syndrome; Cervical facet syndrome; Spondylosis without myelopathy or radiculopathy, lumbosacral region; Chronic sacroiliac joint pain (Bilateral); Abnormal MRI, cervical spine (01/23/2022); and Abnormal MRI, thoracic spine (01/23/2022) on their pertinent problem list. Pain Assessment: Severity of   is reported as a  /10. Location:    / . Onset:  . Quality:  . Timing:  . Modifying factor(s):  Marland Kitchen Vitals:  vitals were not taken for this visit.  BMI: Estimated body mass index is 30.51 kg/m as calculated from the following:   Height as of 06/10/22: 5\' 6"  (1.676 m).   Weight as of 06/10/22: 189 lb (85.7 kg).  Reason for encounter: evaluation of worsening, or previously known (established) problem. ***  Pharmacotherapy Assessment  Analgesic: No chronic opioid analgesics therapy prescribed by our practice. See 01/08/22 UDS. (+) THC. MME/day: 0 mg/day   Monitoring: [place] PMP: PDMP reviewed during this encounter.       Pharmacotherapy: No side-effects or adverse reactions reported. Compliance: No problems identified. Effectiveness: Clinically acceptable.  No notes on file  No results found for: "CBDTHCR" No results found for: "D8THCCBX" No results found for: "D9THCCBX"  UDS:  Summary  Date Value Ref Range Status  01/08/2022 Note  Final    Comment:    ==================================================================== Compliance Drug Analysis,  Ur ==================================================================== Test                             Result  Flag       Units  Drug Present not Declared for Prescription Verification   Carboxy-THC                    46           UNEXPECTED ng/mg creat    Carboxy-THC is a metabolite of tetrahydrocannabinol (THC). Source of    THC is most commonly herbal marijuana or marijuana-based products,    but THC is also present in a scheduled prescription medication.    Trace amounts of THC can be present in hemp and cannabidiol (CBD)    products. This test is not intended to distinguish between delta-9-    tetrahydrocannabinol, the predominant form of THC in most herbal or    marijuana-based products, and delta-8-tetrahydrocannabinol.  Drug Absent but Declared for Prescription Verification   Salicylate                     Not Detected UNEXPECTED    Salicylate, as indicated in the declared medication list, is not    always detected even when used as directed.     Aspirin, as indicated in the declared medication list, is not always    detected even when used as directed.  ==================================================================== Test                      Result    Flag   Units      Ref Range   Creatinine              125              mg/dL      >=20 ==================================================================== Declared Medications:  The flagging and interpretation on this report are based on the  following declared medications.  Unexpected results may arise from  inaccuracies in the declared medications.   **Note: The testing scope of this panel does not include small to  moderate amounts of these reported medications:   Aspirin  Salicylate (Salicylamide)   **Note: The testing scope of this panel does not include the  following reported medications:   Caffeine  Fluticasone (Flonase)  Ondansetron (Zofran)  Ramelteon (Rozerem)  Ropinirole (Requip)   Rosuvastatin (Crestor)  Sumatriptan ==================================================================== For clinical consultation, please call [phone]. ====================================================================       ROS  Constitutional: Denies any fever or chills Gastrointestinal: No reported hemesis, hematochezia, vomiting, or acute GI distress Musculoskeletal: Denies any acute onset joint swelling, redness, loss of ROM, or weakness Neurological: No reported episodes of acute onset apraxia, aphasia, dysarthria, agnosia, amnesia, paralysis, loss of coordination, or loss of consciousness  Medication Review  Aspirin-Salicylamide-Caffeine, SUMAtriptan, fluticasone, ondansetron, pregabalin, rOPINIRole, ramelteon, and rosuvastatin  History Review  Allergy: [name] is allergic to penicillins and sulfa antibiotics. Drug: [name]  reports that she does not currently use drugs after having used the following drugs: Marijuana. Alcohol:  reports current alcohol use of about 42.0 standard drinks of alcohol per week. Tobacco:  reports that she has been smoking cigarettes. She has a 156.00 pack-year smoking history. She has never used smokeless tobacco. Social: Ms. Wisser  reports that she has been smoking cigarettes. She has a 156.00 pack-year smoking history. She has never used smokeless tobacco. She reports current alcohol use of about 42.0 standard drinks of alcohol per week. She reports that she does not currently use drugs after having used the following drugs: Marijuana. Medical:  has a past medical history  of Arthritis, Bipolar disorder (HCC), COPD (chronic obstructive pulmonary disease) (HCC), GERD (gastroesophageal reflux disease), Leg pain, Migraine, and Restless leg syndrome. Surgical: [name]  has a past surgical history that includes Appendectomy; Total abdominal hysterectomy w/ bilateral salpingoophorectomy; Colostomy revision (2014); Carpal tunnel release; and  breast surgey. Family: family history includes Alcohol abuse in her father; Heart attack in her brother, father, maternal grandmother, and paternal grandmother; Heart disease in her brother; Seizures in her daughter.  Laboratory Chemistry Profile   Renal Lab Results  Component Value Date   BUN 8 05/26/2022   CREATININE 0.71 05/26/2022   BCR 11 05/26/2022   GFRAA >60 12/06/2015   GFRNONAA >60 12/06/2015    Hepatic Lab Results  Component Value Date   AST 31 05/26/2022   ALT 25 05/26/2022   ALBUMIN 4.5 05/26/2022   ALKPHOS 122 (H) 05/26/2022   LIPASE 24 12/06/2015    Electrolytes Lab Results  Component Value Date   NA 141 05/26/2022   K 4.2 05/26/2022   CL 103 05/26/2022   CALCIUM 9.6 05/26/2022   MG 2.1 01/08/2022   PHOS 4.4 10/05/2012    Bone Lab Results  Component Value Date   25OHVITD1 35 01/08/2022   25OHVITD2 <1.0 01/08/2022   25OHVITD3 35 01/08/2022    Inflammation (CRP: Acute Phase) (ESR: Chronic Phase) Lab Results  Component Value Date   CRP 2 01/08/2022   ESRSEDRATE 22 01/08/2022         Note: Above Lab results reviewed.  Recent Imaging Review  DG PAIN CLINIC C-ARM 1-60 MIN NO REPORT Fluoro was used, but no Radiologist interpretation will be provided.  Please refer to "NOTES" tab for provider progress note. Note: Reviewed        Physical Exam  General appearance: Well nourished, well developed, and well hydrated. In no apparent acute distress Mental status: Alert, oriented x 3 (person, place, & time)       Respiratory: No evidence of acute respiratory distress Eyes: PERLA Vitals: There were no vitals taken for this visit. BMI: Estimated body mass index is 30.51 kg/m as calculated from the following:   Height as of 06/10/22: 5\' 6"  (1.676 m).   Weight as of 06/10/22: 189 lb (85.7 kg). Ideal: Patient weight not recorded  Assessment   Diagnosis Status  1. Chronic low back pain (1ry area of Pain) (Bilateral) (L>R) w/o sciatica   2. Grade 1  Retrolisthesis of L2/L3 and L3/L4   3. Lumbosacral facet arthropathy (Multilevel) (Bilateral)   4. Lumbar facet syndrome (Bilateral)   5. Spondylosis without myelopathy or radiculopathy, lumbosacral region    Controlled Controlled Controlled   Updated Problems: Problem  Other Specified Chronic Obstructive Pulmonary Disease    Plan of Care  Problem-specific:  No problem-specific Assessment & Plan notes found for this encounter.  [name] has a current medication list which includes the following long-term medication(s): pregabalin, sumatriptan, fluticasone, ramelteon, ropinirole, and rosuvastatin.  Pharmacotherapy (Medications Ordered): No orders of the defined types were placed in this encounter.  Orders:  No orders of the defined types were placed in this encounter.  Follow-up plan:   No follow-ups on file.     Interventional Therapies  Risk  Complexity Considerations:   WNL      Planned  Pending:   Diagnostic bilateral lumbar facet RFA #1    Under consideration:   Diagnostic bilateral lumbar facet RFA #1  Diagnostic left L4-5 LESI #1  Diagnostic left L5 and S1 TFESI #1  Diagnostic bilateral SI joint Blk #1  Diagnostic bilateral IA hip joint inj. #1  Diagnostic bilateral thoracic facet MBB #1  Diagnostic bilateral cervical facet MBB #1  Diagnostic bilateral IA shoulder joint inj. #1  Diagnostic bilateral carpal tunnel syndrome inj. #1   Completed:   Diagnostic bilateral lumbar facet MBB x2 (06/10/2022) (1st: 100/100/100/100) (2nd: 100/100/100/40/65)    Completed by other providers:   Nerve block by [facility] in [facility]   Therapeutic  Palliative (PRN) options:   None established     Recent Visits No visits were found meeting these conditions. Showing recent visits within past 90 days and meeting all other requirements Future Appointments Date Type Provider Dept  10/20/22 Appointment Milinda Pointer, MD [facility]   Showing future appointments within next 90 days and meeting all other requirements  I discussed the assessment and treatment plan with the patient. The patient was provided an opportunity to ask questions and all were answered. The patient agreed with the plan and demonstrated an understanding of the instructions.  Patient advised to call back or seek an in-person evaluation if the symptoms or condition worsens.  Duration of encounter: *** minutes.  Total time on encounter, as per AMA guidelines included both the face-to-face and non-face-to-face time personally spent by the physician and/or other qualified health care professional(s) on the day of the encounter (includes time in activities that require the physician or other qualified health care professional and does not include time in activities normally performed by clinical staff). Physician's time may include the following activities when performed: Preparing to see the patient (e.g., pre-charting review of records, searching for previously ordered imaging, lab work, and nerve conduction tests) Review of prior analgesic pharmacotherapies. Reviewing PMP Interpreting ordered tests (e.g., lab work, imaging, nerve conduction tests) Performing post-procedure evaluations, including interpretation of diagnostic procedures Obtaining and/or reviewing separately obtained history Performing a medically appropriate examination and/or evaluation Counseling and educating the patient/family/caregiver Ordering medications, tests, or procedures Referring and communicating with other health care professionals (when not separately reported) Documenting clinical information in the electronic or other health record Independently interpreting results (not separately reported) and communicating results to the patient/ family/caregiver Care coordination (not separately reported)  Note by: Gaspar Cola, MD Date: 10/20/2022; Time: 11:20 AM

## 2022-10-20 ENCOUNTER — Ambulatory Visit (HOSPITAL_BASED_OUTPATIENT_CLINIC_OR_DEPARTMENT_OTHER): Payer: Medicaid Other | Admitting: Pain Medicine

## 2022-10-20 DIAGNOSIS — M4310 Spondylolisthesis, site unspecified: Secondary | ICD-10-CM

## 2022-10-20 DIAGNOSIS — G8929 Other chronic pain: Secondary | ICD-10-CM

## 2022-10-20 DIAGNOSIS — Z91199 Patient's noncompliance with other medical treatment and regimen due to unspecified reason: Secondary | ICD-10-CM

## 2022-10-20 DIAGNOSIS — M47817 Spondylosis without myelopathy or radiculopathy, lumbosacral region: Secondary | ICD-10-CM

## 2022-10-20 DIAGNOSIS — M47816 Spondylosis without myelopathy or radiculopathy, lumbar region: Secondary | ICD-10-CM

## 2022-10-20 HISTORY — DX: Patient's noncompliance with other medical treatment and regimen due to unspecified reason: Z91.199

## 2022-10-21 ENCOUNTER — Encounter: Payer: Self-pay | Admitting: Nurse Practitioner

## 2022-11-19 DIAGNOSIS — Z6830 Body mass index (BMI) 30.0-30.9, adult: Secondary | ICD-10-CM | POA: Diagnosis not present

## 2022-11-19 DIAGNOSIS — Z0001 Encounter for general adult medical examination with abnormal findings: Secondary | ICD-10-CM | POA: Diagnosis not present

## 2022-11-19 DIAGNOSIS — R768 Other specified abnormal immunological findings in serum: Secondary | ICD-10-CM | POA: Diagnosis not present

## 2022-11-19 DIAGNOSIS — F419 Anxiety disorder, unspecified: Secondary | ICD-10-CM | POA: Diagnosis not present

## 2022-11-19 DIAGNOSIS — I700 Atherosclerosis of aorta: Secondary | ICD-10-CM | POA: Diagnosis not present

## 2022-11-19 DIAGNOSIS — Z1211 Encounter for screening for malignant neoplasm of colon: Secondary | ICD-10-CM | POA: Diagnosis not present

## 2022-11-19 DIAGNOSIS — R748 Abnormal levels of other serum enzymes: Secondary | ICD-10-CM | POA: Diagnosis not present

## 2022-11-19 DIAGNOSIS — R918 Other nonspecific abnormal finding of lung field: Secondary | ICD-10-CM | POA: Diagnosis not present

## 2022-11-19 DIAGNOSIS — F32A Depression, unspecified: Secondary | ICD-10-CM | POA: Diagnosis not present

## 2022-11-19 DIAGNOSIS — J439 Emphysema, unspecified: Secondary | ICD-10-CM | POA: Diagnosis not present

## 2022-11-19 DIAGNOSIS — Z1239 Encounter for other screening for malignant neoplasm of breast: Secondary | ICD-10-CM | POA: Diagnosis not present

## 2022-11-19 DIAGNOSIS — M79644 Pain in right finger(s): Secondary | ICD-10-CM | POA: Diagnosis not present

## 2022-11-25 ENCOUNTER — Encounter: Payer: Self-pay | Admitting: Cardiology

## 2022-11-25 ENCOUNTER — Encounter: Payer: Self-pay | Admitting: *Deleted

## 2022-12-18 NOTE — Progress Notes (Deleted)
Cardiology Office Note:    Date:  12/18/2022   ID:  [name], DOB 1966-04-14, MRN [id number]  PCP:  [name], NP  Cardiologist:  Shirlee More, MD   Referring MD: Alphonsa Overall, FNP  ASSESSMENT:    No diagnosis found. PLAN:    In order of problems listed above:  ***  Next appointment   Medication Adjustments/Labs and Tests Ordered: Current medicines are reviewed at length with the patient today.  Concerns regarding medicines are outlined above.  No orders of the defined types were placed in this encounter.  No orders of the defined types were placed in this encounter.    No chief complaint on file. ***  History of Present Illness:    Kim [name] is a 57 y.o. female who is being seen today for the evaluation of *** at the request of Alphonsa Overall, FNP.  CT of chest November 2022 history of aortic atherosclerosis.  Current recent labs 11/19/2022: Hemoglobin 15.7 platelets 276,000.  CMP is normal except for random glucose of 110 sodium 140 potassium 4.3 creatinine 0.71 and normal liver function tests Past Medical History:  Diagnosis Date   Aortic atherosclerosis ([place]) 01/14/2021   Arthritis    Bipolar disorder ([place])    Carpal tunnel syndrome (Bilateral) 01/08/2022   Centrilobular emphysema ([place]) 02/21/2016   Formatting of this note might be different from the original.  Last Assessment & Plan:   Formatting of this note might be different from the original.  Ongoing. Not well controlled.  Saw Pulmonology whom she felt didn't help her cough.  Requesting new referral to see a different Pulmonologist.  Referral was placed today during the visit.   Cervical radiculopathy 08/01/2020   Chronic obstructive pulmonary disease ([place]) 06/25/2011   Active smoker  - 04/30/2021  After extensive coaching inhaler device,  effectiveness =    90% with smi > resume stiolto     Formatting of this note might be different from the original.  Formatting of this  note might be different from the original.  Active smoker  - 04/30/2021  After extensive coaching inhaler device,  effectiveness =    90% with smi > resume stiolto     Last Assessment & Plan:   Fo   COPD (chronic obstructive pulmonary disease) (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  L4-5: bilateral facet arthropathy  L5-S1: bilateral facet arthropathy. There are trace bilateral facet joint effusions with mild perifacetal soft tissue edema at this level which could reflect a source of pain.     There is facet arthropathy most advanced at L4-L5 and L5-S   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) (Bilateral: L5-S1) 12/23/2021   (10/28/2021) LUMBAR MRI FINDINGS:  LEVELS:  L3-4: mild disc bulge with a left foraminal component resulting in mild left and no significant right neural foraminal stenosis. There is possible contact of the exiting left L3 nerve root by foraminal disc material.  L5-S1: diffuse disc bulge with bilateral foraminal components resulting in mild-to-moderate bilateral neural foraminal stenosis.   Migraines 07/02/2015   Multiple pulmonary nodules determined by computed tomography of  lung 05/01/2021   Active smoker  03/07/21 (vs 12/04/20) new subpleural  area of airspace consolidation within the anterior basal right upper  lobe abutting the major fissure. This area has a mean derived  - CT 07/17/2021 lesions have either resolved or now change c/w inflammatory etiology plus emphysema  > repeat 18 mplaced  in reminder file      Formatting of this note might be different from the  original.  [place]   Nicotine dependence, cigarettes, w unsp disorders 06/25/2011   Other specified chronic obstructive pulmonary disease 06/25/2011   Restless leg syndrome    RLS (restless legs syndrome) 07/02/2015   Spondylosis without myelopathy or radiculopathy, lumbosacral region 02/26/2022   Stress incontinence 11/01/2021   T12 compression fracture, sequela 08/01/2020   Thoracic facet syndrome 01/08/2022   Upper airway cough syndrome 05/01/2021   Onset [id number]   - cyclical cough rx  [id number] >>>        Past Surgical History:  Procedure Laterality Date   APPENDECTOMY  1986   breast surgey Left    Cyst Excision   CARPAL TUNNEL RELEASE     COLOSTOMY REVISION  2014   TOTAL ABDOMINAL HYSTERECTOMY W/ BILATERAL SALPINGOOPHORECTOMY  2008   endometriosis    Current Medications: No outpatient medications have been marked as taking for the 12/19/22 encounter (Appointment) with Richardo Priest, MD.     Allergies:   Penicillins and Sulfa antibiotics   Social History   Socioeconomic History   Marital status: Divorced    Spouse name: Not on file   Number of children: 1   Years of education: HS   Highest education level: Not on file  Occupational History   Occupation: Disabled  Tobacco Use   Smoking status: Every Day    Packs/day: 3.00    Years: 39.00    Additional pack years: 0.00    Total pack years: 117.00    Types: Cigarettes   Smokeless tobacco: Never   Tobacco comments:    3.5-4pdd 04/30/2021  Vaping Use   Vaping Use: Former  Substance and Sexual Activity   Alcohol use: Yes    Alcohol/week: 42.0 standard drinks of alcohol    Types: 42 Cans of beer per week   Drug use: Not Currently    Types: Marijuana   Sexual activity: Not Currently  Other Topics Concern   Not on file  Social History Narrative   Lives at home alone.   Drinks two pots of coffee per day.   Right-handed.   Social Determinants of Health   Financial Resource Strain: Not on file  Food  Insecurity: Food Insecurity Present (11/14/2021)   Hunger Vital Sign    Worried About Running Out of Food in the Last Year: Sometimes true    Ran Out of Food in the Last Year: Sometimes true  Transportation Needs: No Transportation Needs (11/14/2021)   PRAPARE - Hydrologist (Medical): No    Lack of Transportation (Non-Medical): No  Physical Activity: Insufficiently Active (12/05/2021)   Exercise Vital Sign    Days of Exercise per Week: 7 days    Minutes of Exercise per Session: 20 min  Stress: Stress Concern Present (08/22/2021)   [place]    Feeling of Stress : Rather much  Social Connections: Socially Isolated (06/14/2021)   Social Connection and Isolation Panel [NHANES]    Frequency of Communication with Friends and Family: More than three times a  week    Frequency of Social Gatherings with Friends and Family: More than three times a week    Attends Religious Services: Never    Marine scientist or Organizations: No    Attends Music therapist: Never    Marital Status: Divorced     Family History: The patient's ***family history includes Alcohol abuse in her father; Heart attack in her brother, father, maternal grandmother, and paternal grandmother; Heart disease in her brother; Seizures in her daughter.  ROS:   ROS Please see the history of present illness.    *** All other systems reviewed and are negative.  EKGs/Labs/Other Studies Reviewed:    The following studies were reviewed today: ***      EKG:  EKG is *** ordered today.  The ekg ordered today is personally reviewed and demonstrates ***  Recent Labs: 01/08/2022: Magnesium 2.1 05/26/2022: ALT 25; BUN 8; Creatinine, Ser 0.71; Hemoglobin 15.8; Platelets 268; Potassium 4.2; Sodium 141  Recent Lipid Panel    Component Value Date/Time   CHOL 178 11/01/2021 1513   CHOL 211 (H) 07/02/2015 1131   TRIG 85  11/01/2021 1513   TRIG 274 (H) 07/02/2015 1131   HDL 67 11/01/2021 1513   CHOLHDL 2.7 11/01/2021 1513   VLDL 55 (H) 07/02/2015 1131   LDLCALC 95 11/01/2021 1513    Physical Exam:    VS:  There were no vitals taken for this visit.    Wt Readings from Last 3 Encounters:  11/19/22 195 lb (88.5 kg)  06/10/22 189 lb (85.7 kg)  05/26/22 189 lb 9.6 oz (86 kg)     GEN: *** Well nourished, well developed in no acute distress HEENT: Normal NECK: No JVD; No carotid bruits LYMPHATICS: No lymphadenopathy CARDIAC: ***RRR, no murmurs, rubs, gallops RESPIRATORY:  Clear to auscultation without rales, wheezing or rhonchi  ABDOMEN: Soft, non-tender, non-distended MUSCULOSKELETAL:  No edema; No deformity  SKIN: Warm and dry NEUROLOGIC:  Alert and oriented x 3 PSYCHIATRIC:  Normal affect     Signed, Shirlee More, MD  12/18/2022 5:29 PM    [facility]

## 2022-12-19 ENCOUNTER — Ambulatory Visit: Payer: Medicaid Other | Admitting: Cardiology

## 2022-12-24 ENCOUNTER — Other Ambulatory Visit: Payer: Self-pay

## 2022-12-24 DIAGNOSIS — M1990 Unspecified osteoarthritis, unspecified site: Secondary | ICD-10-CM | POA: Insufficient documentation

## 2022-12-24 DIAGNOSIS — M79606 Pain in leg, unspecified: Secondary | ICD-10-CM | POA: Insufficient documentation

## 2022-12-24 DIAGNOSIS — F319 Bipolar disorder, unspecified: Secondary | ICD-10-CM | POA: Insufficient documentation

## 2022-12-24 DIAGNOSIS — I2510 Atherosclerotic heart disease of native coronary artery without angina pectoris: Secondary | ICD-10-CM | POA: Insufficient documentation

## 2022-12-24 DIAGNOSIS — M48061 Spinal stenosis, lumbar region without neurogenic claudication: Secondary | ICD-10-CM | POA: Insufficient documentation

## 2022-12-31 ENCOUNTER — Other Ambulatory Visit: Payer: Self-pay | Admitting: Internal Medicine

## 2022-12-31 DIAGNOSIS — R918 Other nonspecific abnormal finding of lung field: Secondary | ICD-10-CM

## 2023-01-07 ENCOUNTER — Encounter: Payer: Self-pay | Admitting: Cardiology

## 2023-01-07 ENCOUNTER — Ambulatory Visit: Payer: Medicaid Other | Attending: Cardiology | Admitting: Cardiology

## 2023-01-07 VITALS — BP 116/68 | HR 67 | Ht 67.0 in | Wt 183.6 lb

## 2023-01-07 DIAGNOSIS — I209 Angina pectoris, unspecified: Secondary | ICD-10-CM | POA: Insufficient documentation

## 2023-01-07 DIAGNOSIS — E782 Mixed hyperlipidemia: Secondary | ICD-10-CM

## 2023-01-07 DIAGNOSIS — F17210 Nicotine dependence, cigarettes, uncomplicated: Secondary | ICD-10-CM

## 2023-01-07 DIAGNOSIS — I700 Atherosclerosis of aorta: Secondary | ICD-10-CM | POA: Diagnosis not present

## 2023-01-07 HISTORY — DX: Mixed hyperlipidemia: E78.2

## 2023-01-07 HISTORY — DX: Angina pectoris, unspecified: I20.9

## 2023-01-07 MED ORDER — NITROGLYCERIN 0.4 MG SL SUBL
0.4000 mg | SUBLINGUAL_TABLET | SUBLINGUAL | 6 refills | Status: DC | PRN
Start: 2023-01-07 — End: 2023-02-16

## 2023-01-07 MED ORDER — ASPIRIN 81 MG PO TBEC: 81.0000 mg | DELAYED_RELEASE_TABLET | Freq: Every day | ORAL | 3 refills | Status: DC

## 2023-01-07 NOTE — Patient Instructions (Signed)
Medication Instructions:  Your physician has recommended you make the following change in your medication:   Take 81 mg coated aspirin daily.  Use nitroglycerin 1 tablet placed under the tongue at the first sign of chest pain or an angina attack. 1 tablet may be used every 5 minutes as needed, for up to 15 minutes. Do not take more than 3 tablets in 15 minutes. If pain persist call 911 or go to the nearest ED.  *If you need a refill on your cardiac medications before your next appointment, please call your pharmacy*   Lab Work: Your physician recommends that you have a BMET and CBC today in the office for your upcoming procedure.  If you have labs (blood work) drawn today and your tests are completely normal, you will receive your results only by: MyChart Message (if you have MyChart) OR A paper copy in the mail If you have any lab test that is abnormal or we need to change your treatment, we will call you to review the results.   Testing/Procedures:  [place] [facility] DEPT OF [facility] [place] [address] [place] [address] Dept: [phone] Loc: [phone]  Amore[facility]  01/07/2023  You are scheduled for a Cardiac Catheterization on Monday, April 29 with Dr. Nanetta Batty.  1. Please arrive at the [facility] (Main Entrance A) at [facility]: [address] [place], [place] [address] at 7:00 AM (This time is two hours before your procedure to ensure your preparation). Free valet parking service is available. You will check in at ADMITTING. The support person will be asked to wait in the waiting room.  It is OK to have someone drop you off and come back when you are ready to be discharged.    Special note: Every effort is made to have your procedure done on time. Please understand that emergencies sometimes delay scheduled procedures.  2. Diet: Do not eat solid foods after midnight.  The patient may  have clear liquids until 5am upon the day of the procedure.  3. Labs: You had your labs done today.  On the morning of your procedure, take your Aspirin 81 mg and any morning medicines NOT listed above.  You may use sips of water.  5. Plan to go home the same day, you will only stay overnight if medically necessary. 6. Bring a current list of your medications and current insurance cards. 7. You MUST have a responsible person to drive you home. 8. Someone MUST be with you the first 24 hours after you arrive home or your discharge will be delayed. 9. Please wear clothes that are easy to get on and off and wear slip-on shoes.  Thank you for allowing [place] to care for you!   -- [facility]    Follow-Up: At [facility], you and your health needs are our priority.  As part of our continuing mission to provide you with exceptional heart care, we have created designated Provider Care Teams.  These Care Teams include your primary Cardiologist (physician) and Advanced Practice Providers (APPs -  Physician Assistants and Nurse Practitioners) who all work together to provide you with the care you need, when you need it.  We recommend signing up for the patient portal called "MyChart".  Sign up information is provided on this After Visit Summary.  MyChart is used to connect with patients for Virtual Visits (Telemedicine).  Patients  are able to view lab/test results, encounter notes, upcoming appointments, etc.  Non-urgent messages can be sent to your provider as well.   To learn more about what you can do with MyChart, go to [email or web address].    Your next appointment:   1 month(s)  The format for your next appointment:   In Person  Provider:   Belva Crome, MD   Other Instructions  Coronary Angiogram With Stent Coronary angiogram with stent placement is a procedure to widen or open a narrow blood vessel of the heart (coronary artery). Arteries may  become blocked by cholesterol buildup (plaques) in the lining of the artery wall. When a coronary artery becomes partially blocked, blood flow to that area decreases. This may lead to chest pain or a heart attack (myocardial infarction). A stent is a small piece of metal that looks like mesh or spring. Stent placement may be done as treatment after a heart attack, or to prevent a heart attack if a blocked artery is found by a coronary angiogram. Let your health care provider know about: Any allergies you have, including allergies to medicines or contrast dye. All medicines you are taking, including vitamins, herbs, eye drops, creams, and over-the-counter medicines. Any problems you or family members have had with anesthetic medicines. Any blood disorders you have. Any surgeries you have had. Any medical conditions you have, including kidney problems or kidney failure. Whether you are pregnant or may be pregnant. Whether you are breastfeeding. What are the risks? Generally, this is a safe procedure. However, serious problems may occur, including: Damage to nearby structures or organs, such as the heart, blood vessels, or kidneys. A return of blockage. Bleeding, infection, or bruising at the insertion site. A collection of blood under the skin (hematoma) at the insertion site. A blood clot in another part of the body. Allergic reaction to medicines or dyes. Bleeding into the abdomen (retroperitoneal bleeding). Stroke (rare). Heart attack (rare). What happens before the procedure? Staying hydrated Follow instructions from your health care provider about hydration, which may include: Up to 2 hours before the procedure - you may continue to drink clear liquids, such as water, clear fruit juice, black coffee, and plain tea.    Eating and drinking restrictions Follow instructions from your health care provider about eating and drinking, which may include: 8 hours before the procedure - stop  eating heavy meals or foods, such as meat, fried foods, or fatty foods. 6 hours before the procedure - stop eating light meals or foods, such as toast or cereal. 2 hours before the procedure - stop drinking clear liquids. Medicines Ask your health care provider about: Changing or stopping your regular medicines. This is especially important if you are taking diabetes medicines or blood thinners. Taking medicines such as aspirin and ibuprofen. These medicines can thin your blood. Do not take these medicines unless your health care provider tells you to take them. Generally, aspirin is recommended before a thin tube, called a catheter, is passed through a blood vessel and inserted into the heart (cardiac catheterization). Taking over-the-counter medicines, vitamins, herbs, and supplements. General instructions Do not use any products that contain nicotine or tobacco for at least 4 weeks before the procedure. These products include cigarettes, e-cigarettes, and chewing tobacco. If you need help quitting, ask your health care provider. Plan to have someone take you home from the hospital or clinic. If you will be going home right after the procedure, plan to have someone with you for 24  hours. You may have tests and imaging procedures. Ask your health care provider: How your insertion site will be marked. Ask which artery will be used for the procedure. What steps will be taken to help prevent infection. These may include: Removing hair at the insertion site. Washing skin with a germ-killing soap. Taking antibiotic medicine. What happens during the procedure? An IV will be inserted into one of your veins. Electrodes may be placed on your chest to monitor your heart rate during the procedure. You will be given one or more of the following: A medicine to help you relax (sedative). A medicine to numb the area (local anesthetic) for catheter insertion. A small incision will be made for catheter  insertion. The catheter will be inserted into an artery using a guide wire. The location may be in your groin, your wrist, or the fold of your arm (near your elbow). An X-ray procedure (fluoroscopy) will be used to help guide the catheter to the opening of the heart arteries. A dye will be injected into the catheter. X-rays will be taken. The dye helps to show where any narrowing or blockages are located in the arteries. Tell your health care provider if you have chest pain or trouble breathing. A tiny wire will be guided to the blocked spot, and a balloon will be inflated to make the artery wider. The stent will be expanded to crush the plaques into the wall of the vessel. The stent will hold the area open and improve the blood flow. Most stents have a drug coating to reduce the risk of the stent narrowing over time. The artery may be made wider using a drill, laser, or other tools that remove plaques. The catheter will be removed when the blood flow improves. The stent will stay where it was placed, and the lining of the artery will grow over it. A bandage (dressing) will be placed on the insertion site. Pressure will be applied to stop bleeding. The IV will be removed. This procedure may vary among health care providers and hospitals.    What happens after the procedure? Your blood pressure, heart rate, breathing rate, and blood oxygen level will be monitored until you leave the hospital or clinic. If the procedure is done through the leg, you will lie flat in bed for a few hours or for as long as told by your health care provider. You will be instructed not to bend or cross your legs. The insertion site and the pulse in your foot or wrist will be checked often. You may have more blood tests, X-rays, and a test that records the electrical activity of your heart (electrocardiogram, or ECG). Do not drive for 24 hours if you were given a sedative during your procedure. Summary Coronary angiogram  with stent placement is a procedure to widen or open a narrowed coronary artery. This is done to treat heart problems. Before the procedure, let your health care provider know about all the medical conditions and surgeries you have or have had. This is a safe procedure. However, some problems may occur, including damage to nearby structures or organs, bleeding, blood clots, or allergies. Follow your health care provider's instructions about eating, drinking, medicines, and other lifestyle changes, such as quitting tobacco use before the procedure. This information is not intended to replace advice given to you by your health care provider. Make sure you discuss any questions you have with your health care provider. Document Revised: 03/23/2019 Document Reviewed: 03/23/2019 Elsevier Patient  Education  2021 Elsevier Inc.  Aspirin and Your Heart Aspirin is a medicine that prevents the platelets in your blood from sticking together. Platelets are the cells that your blood uses for clotting. Aspirin can be used to help reduce the risk of blood clots, heart attacks, and other heart-related problems. What are the risks? Daily use of aspirin can cause side effects. Some of these include: Bleeding. Bleeding can be minor or serious. An example of minor bleeding is bleeding from a cut, and the bleeding does not stop. An example of more serious bleeding is stomach bleeding or, rarely, bleeding into the brain. Your risk of bleeding increases if you are also taking NSAIDs, such as ibuprofen. Increased bruising. Upset stomach. An allergic reaction. People who have growths inside the nose (nasal polyps) have an increased risk of developing an aspirin allergy. How to use aspirin to care for your heart Take aspirin only as told by your health care provider. Make sure that you understand how much to take and what form to take. The two forms of aspirin are: Non-enteric-coated.This type of aspirin does not have a  coating and is absorbed quickly. This type of aspirin also comes in a chewable form. Enteric-coated. This type of aspirin has a coating that releases the medicine very slowly. Enteric-coated aspirin might cause less stomach upset than non-enteric-coated aspirin. This type of aspirin should not be chewed or crushed. Work with your health care provider to find out whether it is safe and beneficial for you to take aspirin daily. Taking aspirin daily may be helpful if: You have had a heart attack or chest pain, or you are at risk for a heart attack. You have a condition in which certain heart vessels are blocked (coronary artery disease), and you have had a procedure to treat it. Examples are: Open-heart surgery, such as coronary artery bypass surgery (CABG). Coronary angioplasty,which is done to widen a blood vessel of your heart. Having a small mesh tube, or stent, placed in your coronary artery. You have had certain types of stroke or a mini-stroke known as a transient ischemic attack (TIA). You have a narrowing of the arteries that supply the limbs (peripheral artery disease, or PAD). You have long-term (chronic) heart rhythm problems, such as atrial fibrillation, and your health care provider thinks aspirin may help. You have valve disease or have had surgery on a valve. You are considered at increased risk of developing coronary artery disease or PAD.    Follow these instructions at home Medicines Take over-the-counter and prescription medicines only as told by your health care provider. If you are taking blood thinners: Talk with your health care provider before you take any medicines that contain aspirin or NSAIDs, such as ibuprofen. These medicines increase your risk for dangerous bleeding. Take your medicine exactly as told, at the same time every day. Avoid activities that could cause injury or bruising, and follow instructions about how to prevent falls. Wear a medical alert bracelet or  carry a card that lists what medicines you take. General instructions Do not drink alcohol if: Your health care provider tells you not to drink. You are pregnant, may be pregnant, or are planning to become pregnant. If you drink alcohol: Limit how much you use to: 0-1 drink a day for women. 0-2 drinks a day for men. Be aware of how much alcohol is in your drink. In the [place] one drink equals one 12 oz bottle of beer (355 mL), one 5 oz glass  of wine (148 mL), or one 1 oz glass of hard liquor (44 mL). Keep all follow-up visits as told by your health care provider. This is important. Where to find more information The American Heart Association: www.heart.org Contact a health care provider if you have: Unusual bleeding or bruising. Stomach pain or nausea. Ringing in your ears. An allergic reaction that causes hives, itchy skin, or swelling of the lips, tongue, or face. Get help right away if: You notice that your bowel movements are bloody, or dark red or black in color. You vomit or cough up blood. You have blood in your urine. You cough, breathe loudly (wheeze), or feel short of breath. You have chest pain, especially if the pain spreads to your arms, back, neck, or jaw. You have a headache with confusion. You have any symptoms of a stroke. "BE FAST" is an easy way to remember the main warning signs of a stroke: B - Balance. Signs are dizziness, sudden trouble walking, or loss of balance. E - Eyes. Signs are trouble seeing or a sudden change in vision. F - Face. Signs are sudden weakness or numbness of the face, or the face or eyelid drooping on one side. A - Arms. Signs are weakness or numbness in an arm. This happens suddenly and usually on one side of the body. S - Speech. Signs are sudden trouble speaking, slurred speech, or trouble understanding what people say. T - Time. Time to call emergency services. Write down what time symptoms started. You have other signs of a stroke,  such as: A sudden, severe headache with no known cause. Nausea or vomiting. Seizure. These symptoms may represent a serious problem that is an emergency. Do not wait to see if the symptoms will go away. Get medical help right away. Call your local emergency services (911 in the [place] Do not drive yourself to the hospital. Summary Aspirin use can help reduce the risk of blood clots, heart attacks, and other heart-related problems. Daily use of aspirin can cause side effects. Take aspirin only as told by your health care provider. Make sure that you understand how much to take and what form to take. Your health care provider will help you determine whether it is safe and beneficial for you to take aspirin daily. This information is not intended to replace advice given to you by your health care provider. Make sure you discuss any questions you have with your health care provider. Document Revised: 06/06/2019 Document Reviewed: 06/06/2019 Elsevier Patient Education  2021 Elsevier Inc. Nitroglycerin sublingual tablets What is this medicine? NITROGLYCERIN (nye troe GLI ser in) is a type of vasodilator. It relaxes blood vessels, increasing the blood and oxygen supply to your heart. This medicine is used to relieve chest pain caused by angina. It is also used to prevent chest pain before activities like climbing stairs, going outdoors in cold weather, or sexual activity. This medicine may be used for other purposes; ask your health care provider or pharmacist if you have questions. COMMON BRAND NAME(S): Nitroquick, Nitrostat, Nitrotab What should I tell my health care provider before I take this medicine? They need to know if you have any of these conditions: anemia head injury, recent stroke, or bleeding in the brain liver disease previous heart attack an unusual or allergic reaction to nitroglycerin, other medicines, foods, dyes, or preservatives pregnant or trying to get  pregnant breast-feeding How should I use this medicine? Take this medicine by mouth as needed. Use at the first  sign of an angina attack (chest pain or tightness). You can also take this medicine 5 to 10 minutes before an event likely to produce chest pain. Follow the directions exactly as written on the prescription label. Place one tablet under your tongue and let it dissolve. Do not swallow whole. Replace the dose if you accidentally swallow it. It will help if your mouth is not dry. Saliva around the tablet will help it to dissolve more quickly. Do not eat or drink, smoke or chew tobacco while a tablet is dissolving. Sit down when taking this medicine. In an angina attack, you should feel better within 5 minutes after your first dose. You can take a dose every 5 minutes up to a total of 3 doses. If you do not feel better or feel worse after 1 dose, call 9-1-1 at once. Do not take more than 3 doses in 15 minutes. Your health care provider might give you other directions. Follow those directions if he or she does. Do not take your medicine more often than directed. Talk to your health care provider about the use of this medicine in children. Special care may be needed. Overdosage: If you think you have taken too much of this medicine contact a poison control center or emergency room at once. NOTE: This medicine is only for you. Do not share this medicine with others. What if I miss a dose? This does not apply. This medicine is only used as needed. What may interact with this medicine? Do not take this medicine with any of the following medications: certain migraine medicines like ergotamine and dihydroergotamine (DHE) medicines used to treat erectile dysfunction like sildenafil, tadalafil, and vardenafil riociguat This medicine may also interact with the following medications: alteplase aspirin heparin medicines for high blood pressure medicines for mental depression other medicines used to  treat angina phenothiazines like chlorpromazine, mesoridazine, prochlorperazine, thioridazine This list may not describe all possible interactions. Give your health care provider a list of all the medicines, herbs, non-prescription drugs, or dietary supplements you use. Also tell them if you smoke, drink alcohol, or use illegal drugs. Some items may interact with your medicine. What should I watch for while using this medicine? Tell your doctor or health care professional if you feel your medicine is no longer working. Keep this medicine with you at all times. Sit or lie down when you take your medicine to prevent falling if you feel dizzy or faint after using it. Try to remain calm. This will help you to feel better faster. If you feel dizzy, take several deep breaths and lie down with your feet propped up, or bend forward with your head resting between your knees. You may get drowsy or dizzy. Do not drive, use machinery, or do anything that needs mental alertness until you know how this drug affects you. Do not stand or sit up quickly, especially if you are an older patient. This reduces the risk of dizzy or fainting spells. Alcohol can make you more drowsy and dizzy. Avoid alcoholic drinks. Do not treat yourself for coughs, colds, or pain while you are taking this medicine without asking your doctor or health care professional for advice. Some ingredients may increase your blood pressure. What side effects may I notice from receiving this medicine? Side effects that you should report to your doctor or health care professional as soon as possible: allergic reactions (skin rash, itching or hives; swelling of the face, lips, or tongue) low blood pressure (dizziness; feeling  faint or lightheaded, falls; unusually weak or tired) low red blood cell counts (trouble breathing; feeling faint; lightheaded, falls; unusually weak or tired) Side effects that usually do not require medical attention (report to  your doctor or health care professional if they continue or are bothersome): facial flushing (redness) headache nausea, vomiting This list may not describe all possible side effects. Call your doctor for medical advice about side effects. You may report side effects to FDA at 1-800-FDA-1088. Where should I keep my medicine? Keep out of the reach of children. Store at room temperature between 20 and 25 degrees C (68 and 77 degrees F). Store in Retail buyer. Protect from light and moisture. Keep tightly closed. Throw away any unused medicine after the expiration date. NOTE: This sheet is a summary. It may not cover all possible information. If you have questions about this medicine, talk to your doctor, pharmacist, or health care provider.  2021 Elsevier/Gold Standard (2018-06-02 16:46:32)

## 2023-01-07 NOTE — H&P (View-Only) (Signed)
Cardiology Office Note:    Date:  01/07/2023   ID:  Kim [name], DOB 09/29/1965, MRN [id number]  PCP:  Holdsworth, [name], NP  Cardiologist:  Jarry Manon R Amiracle Neises, MD   Referring MD: Curlee, Venessa, FNP    ASSESSMENT:    1. Aortic atherosclerosis   2. Angina pectoris   3. Mixed dyslipidemia   4. Cigarette smoker    PLAN:    In order of problems listed above:  Angina pectoris: Patient's symptoms are significant.  Following recommendations were made to the patient.  She was advised to take a coated baby aspirin on daily basis.  Sublingual nitroglycerin prescription was sent, its protocol and 911 protocol explained and the patient vocalized understanding questions were answered to the patient's satisfaction.In view of the patient's symptoms, I discussed with the patient options for evaluation. Invasive and noninvasive options were given to the patient. I discussed stress testing and coronary angiography and left heart catheterization at length. Benefits, pros and cons of each approach were discussed at length. Patient had multiple questions which were answered to the patient's satisfaction. Patient opted for invasive evaluation and we will set up for coronary angiography and left heart catheterization. Further recommendations will be made based on the findings with coronary angiography. In the interim if the patient has any significant symptoms in hospital to the nearest emergency room.  I mention CT coronary angiography and she is not keen on it. Makes dyslipidemia and aortic atherosclerosis: On lipid-lowering medications followed by primary care.  Diet emphasized. Cigarette smoker: I spent 5 minutes with the patient discussing solely about smoking. Smoking cessation was counseled. I suggested to the patient also different medications and pharmacological interventions. Patient is keen to try stopping on its own at this time. He will get back to me if he needs any further assistance in this  matter. Patient seen in follow-up appointment after coronary angiography.   Medication Adjustments/Labs and Tests Ordered: Current medicines are reviewed at length with the patient today.  Concerns regarding medicines are outlined above.  No orders of the defined types were placed in this encounter.  No orders of the defined types were placed in this encounter.    History of Present Illness:    Kim [name] is a 57 y.o. female who is being seen today for the evaluation of chest pain on exertion at the request of Curlee, Venessa, FNP.  Patient is a pleasant 57-year-old female.  She has past medical history of heavy cigarette smoking and COPD.  She smokes 2 to 3 packs a day or more.  She has history of mixed dyslipidemia.  She mentions to me that every time she exerts a little bit she feels tight in the chest and going to the neck.  She is concerned about it.  No orthopnea or PND.  The symptoms have stressed her abdomen therefore she is here for evaluation.  She has history of mixed dyslipidemia.  No history of diabetes mellitus.  At the time of my evaluation, the patient is alert awake oriented and in no distress.  Past Medical History:  Diagnosis Date   Abnormal drug screen (01/08/2022 UDS) 02/26/2022   (01/08/2022) abnormal UDS (+) for carboxy-THC (marijuana)    Abnormal MRI, cervical spine (01/23/2022) 02/26/2022   (01/23/2022) MRI CERVICAL SPINE FINDINGS:  Alignment: Mild reversal the normal cervical lordosis.     DISC LEVELS:  C3-4: Mild bilateral facet arthropathy  C4-5: Minimal disc bulge and mild bilateral facet arthropathy       IMPRESSION:  1. Mild multilevel degenerative change without significant canal or foraminal stenosis.   Abnormal MRI, lumbar spine (10/28/2021) 12/23/2021   (10/28/2021) LUMBAR MRI FINDINGS:  Alignment: Is trace retrolisthesis of L2 on L3 and L3 on L4, not significantly changed. Alignment is otherwise normal.  Vertebrae: There is anterior compression deformity  of the T12 vertebral body with up to approximately 25% loss of vertebral body height anteriorly, not significantly changed since 2021. The other vertebral body heights are preserved. There is mi   Abnormal MRI, thoracic spine (01/23/2022) 02/26/2022   (01/23/2022) MRI THORACIC SPINE FINDINGS:  Alignment: Mildly exaggerated thoracic kyphosis at T11-T12 due to T12 fracture described below.  Vertebrae: Remote T12 compression fracture, unchanged from 10/28/2021 MRI      DISC LEVELS:  Mild disc bulge at T11-12      IMPRESSION:  1. Mild multilevel degenerative change without significant canal or foraminal stenosis.  2. Remote T12 fracture with similar    Aortic atherosclerosis 01/14/2021   Arthritis    Atherosclerotic heart disease of native coronary artery without angina pectoris    Bilateral stenosis of lateral recess of lumbar spine    Bipolar disorder    Carpal tunnel syndrome (Bilateral) 01/08/2022   Centrilobular emphysema 02/21/2016   Formatting of this note might be different from the original.  Last Assessment & Plan:   Formatting of this note might be different from the original.  Ongoing. Not well controlled.  Saw Pulmonology whom she felt didn't help her cough.  Requesting new referral to see a different Pulmonologist.  Referral was placed today during the visit.   Cervical facet syndrome 01/08/2022   Cervical radiculopathy 08/01/2020   Cervicalgia 01/08/2022   Cervicogenic headache 01/08/2022   Chronic cough 03/16/2022   Chronic hip pain (2ry area of Pain) (Bilateral) (L>R) 01/08/2022   Chronic low back pain (1ry area of Pain) (Bilateral) (L>R) w/o sciatica 07/04/2021   Chronic lower extremity pain (3ry area of Pain) (Intermittent) (Left) 01/08/2022   Chronic neck and back pain (5th area of Pain) (Midline) 01/08/2022   Chronic obstructive pulmonary disease 06/25/2011   Active smoker  - 04/30/2021  After extensive coaching inhaler device,  effectiveness =    90% with smi > resume stiolto      Formatting of this note might be different from the original.  Formatting of this note might be different from the original.  Active smoker  - 04/30/2021  After extensive coaching inhaler device,  effectiveness =    90% with smi > resume stiolto     Last Assessment & Plan:   Fo   Chronic pain syndrome 12/23/2021   Chronic sacroiliac joint pain (Bilateral) 02/26/2022   Chronic shoulder pain (6th area of Pain) (Bilateral) 01/08/2022   Chronic thoracic back pain (4th area of Pain) (Midline) (Bilateral) 01/08/2022   Cigarette smoker 05/01/2021   DDD (degenerative disc disease), cervical 12/23/2021   DDD (degenerative disc disease), lumbosacral 12/23/2021   (10/28/2021) LUMBAR MRI FINDINGS:  LEVELS:  There is disc desiccation without significant loss of height at L3-L4 and L4-L5. There is disc desiccation and mild narrowing at L1-L2, L2-L3, and L5-S1.  L1-2: central protrusion  L2-3: disc bulge  L3-4: disc bulge with a left foraminal component and annular fissure  L4-5: diffuse disc bulge with a central annular fissure  L5-S1: diffuse disc bulge with   DDD (degenerative disc disease), thoracic 12/23/2021   Disorder of skeletal system 12/23/2021   Elevated alkaline phosphatase level 02/26/2022   (01/08/2022)-   Normal ALP (Alkaline phosphatase) levels are between 44 -121 IU/L, for our Lab. High ALP could suggest liver damage or increased bone cell activity. If other tests such as bilirubin, aspartate aminotransferase (AST), or alanine aminotransferase (ALT) are also high, usually the increased ALP is coming from the liver. Higher-than-normal ALP levels can be seen with: biliary obstruction;    Failure to attend appointment 10/20/2022   GERD (gastroesophageal reflux disease)    Grade 1 Retrolisthesis of cervical region (2 mm) (C4/C5) 12/23/2021   Grade 1 Retrolisthesis of L2/L3 and L3/L4 01/08/2022   (10/28/2021) LUMBAR MRI FINDINGS:  Alignment: Is trace retrolisthesis of L2 on L3 and L3 on L4.  LEVELS:  L2-3:  There is a mild disc bulge and mild facet arthropathy  L3-4: There is a mild disc bulge with a left foraminal component and annular fissure and mild left worse than right facet arthropathy resulting in narrowing of the left subarticular zone with crowding of the traversing nerve root, an   Headache disorder 09/20/2020   History of ileus 02/19/2021   History of marijuana use 02/26/2022   (01/08/2022) abnormal UDS (+) for carboxy-THC (marijuana)   The electronic medical record also indicated positive results on 10/09/2012 and 11/25/2013.   Leg pain    bilateral   Lumbar disc annular tears 01/08/2022   (10/28/2021) LUMBAR MRI FINDINGS:  LEVELS:  L3-4: There is a mild disc bulge with a left foraminal component and annular fissure  L4-5: There is a diffuse disc bulge with a central annular fissure   Lumbar facet syndrome (Bilateral) 01/08/2022   Lumbar lateral recess stenosis (L3-4) (Left) 12/23/2021   Lumbar radiculopathy 08/01/2020   Lumbosacral facet arthropathy (Multilevel) (Bilateral) 12/23/2021   (10/28/2021) LUMBAR MRI FINDINGS:  LEVELS:  L2-3: mild facet arthropathy  L3-4: mild left worse than right facet arthropathy.  L4-5: bilateral facet arthropathy  L5-S1: bilateral facet arthropathy. There are trace bilateral facet joint effusions with mild perifacetal soft tissue edema at this level which could reflect a source of pain.     There is facet arthropathy most advanced at L4-L5 and L5-S   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) (Bilateral: L5-S1) 12/23/2021   (10/28/2021) LUMBAR MRI FINDINGS:  LEVELS:  L3-4: mild disc bulge with a left foraminal component resulting in mild left and no significant right neural foraminal stenosis. There is possible contact of the exiting left L3 nerve root by foraminal disc material.  L5-S1: diffuse disc bulge with bilateral foraminal components resulting in mild-to-moderate bilateral neural foraminal stenosis.   Lumbosacral lateral recess stenosis (L3-4, L5-S1)  (Left) 01/08/2022   (10/28/2021) LUMBAR MRI FINDINGS:  LEVELS:  L3-4: narrowing of the left subarticular zone. There is possible contact of the exiting left L3 nerve root by foraminal disc material.  L5-S1: mild crowding of the left subarticular zone   Marijuana use 02/26/2022   (01/08/2022) abnormal UDS (+) for carboxy-THC (marijuana)    Migraines 07/02/2015   Multiple pulmonary nodules determined by computed tomography of lung 05/01/2021   Active smoker  03/07/21 (vs 12/04/20) new subpleural  area of airspace consolidation within the anterior basal right upper  lobe abutting the major fissure. This area has a mean derived  - CT 07/17/2021 lesions have either resolved or now change c/w inflammatory etiology plus emphysema  > repeat 18 mplaced  in reminder file      Formatting of this note might be different from the original.  Formattin   Nicotine dependence, cigarettes, w unsp disorders 06/25/2011   Numbness and   tingling in hands (Bilateral) 01/08/2022   Occipital headache (Bilateral) 01/08/2022   Other specified chronic obstructive pulmonary disease 06/25/2011   Other spondylosis, cervical region (C4/C5 and C5/C6) 12/23/2021   Pharmacologic therapy 12/23/2021   Problems influencing health status 12/23/2021   PTSD (post-traumatic stress disorder) 04/05/2020   Rectal bleeding 02/19/2021   RLS (restless legs syndrome) 07/02/2015   Spondylosis without myelopathy or radiculopathy, lumbosacral region 02/26/2022   Stress incontinence 11/01/2021   T12 compression fracture, sequela 08/01/2020   Thoracic facet syndrome 01/08/2022   Upper airway cough syndrome 05/01/2021   Onset 2021   - cyclical cough rx  04/30/2021 >>>        Past Surgical History:  Procedure Laterality Date   APPENDECTOMY  1986   breast surgey Left    Cyst Excision   CARPAL TUNNEL RELEASE     COLOSTOMY REVISION  2014   TOTAL ABDOMINAL HYSTERECTOMY W/ BILATERAL SALPINGOOPHORECTOMY  2008   endometriosis    Current  Medications: Current Meds  Medication Sig   albuterol (PROVENTIL) (2.5 MG/3ML) 0.083% nebulizer solution Take 2.5 mg by nebulization every 6 (six) hours as needed for wheezing or shortness of breath.   fluticasone (FLONASE) 50 MCG/ACT nasal spray shake liquid AND INSTILL 1 SPRAY IN EACH NOSTRIL DAILY   ondansetron (ZOFRAN-ODT) 8 MG disintegrating tablet DISSOLVE 1 TABLET BY MOUTH EVERY 8 HOURS AS NEEDED FOR NAUSEA OR VOMITING   pregabalin (LYRICA) 50 MG capsule Take 50 mg by mouth daily.   rOPINIRole (REQUIP) 4 MG tablet Take 4 mg by mouth at bedtime.   rosuvastatin (CRESTOR) 5 MG tablet Take 1 tablet (5 mg total) by mouth daily.   SUMAtriptan Succinate 4 MG/0.5ML SOAJ Inject 4 mg into the skin daily as needed (migraine).     Allergies:   Penicillins and Sulfa antibiotics   Social History   Socioeconomic History   Marital status: Divorced    Spouse name: Not on file   Number of children: 1   Years of education: HS   Highest education level: Not on file  Occupational History   Occupation: Disabled  Tobacco Use   Smoking status: Every Day    Packs/day: 3.00    Years: 39.00    Additional pack years: 0.00    Total pack years: 117.00    Types: Cigarettes   Smokeless tobacco: Never   Tobacco comments:    3.5-4pdd 04/30/2021  Vaping Use   Vaping Use: Former  Substance and Sexual Activity   Alcohol use: Yes    Alcohol/week: 42.0 standard drinks of alcohol    Types: 42 Cans of beer per week   Drug use: Not Currently    Types: Marijuana   Sexual activity: Not Currently  Other Topics Concern   Not on file  Social History Narrative   Lives at home alone.   Drinks two pots of coffee per day.   Right-handed.   Social Determinants of Health   Financial Resource Strain: Not on file  Food Insecurity: Food Insecurity Present (11/14/2021)   Hunger Vital Sign    Worried About Running Out of Food in the Last Year: Sometimes true    Ran Out of Food in the Last Year: Sometimes true   Transportation Needs: No Transportation Needs (11/14/2021)   PRAPARE - Transportation    Lack of Transportation (Medical): No    Lack of Transportation (Non-Medical): No  Physical Activity: Insufficiently Active (12/05/2021)   Exercise Vital Sign    Days of Exercise per Week: 7   days    Minutes of Exercise per Session: 20 min  Stress: Stress Concern Present (08/22/2021)   [employer] - Occupational Stress Questionnaire    Feeling of Stress : Rather much  Social Connections: Socially Isolated (06/14/2021)   Social Connection and Isolation Panel [NHANES]    Frequency of Communication with Friends and Family: More than three times a week    Frequency of Social Gatherings with Friends and Family: More than three times a week    Attends Religious Services: Never    Active Member of Clubs or Organizations: No    Attends Club or Organization Meetings: Never    Marital Status: Divorced     Family History: The patient's family history includes Alcohol abuse in her father; Heart attack in her brother, father, maternal grandmother, and paternal grandmother; Heart disease in her brother; Seizures in her daughter.  ROS:   Please see the history of present illness.    All other systems reviewed and are negative.  EKGs/Labs/Other Studies Reviewed:    The following studies were reviewed today: EKG revealed sinus rhythm and nonspecific ST-T changes   Recent Labs: 01/08/2022: Magnesium 2.1 05/26/2022: ALT 25; BUN 8; Creatinine, Ser 0.71; Hemoglobin 15.8; Platelets 268; Potassium 4.2; Sodium 141  Recent Lipid Panel    Component Value Date/Time   CHOL 178 11/01/2021 1513   CHOL 211 (H) 07/02/2015 1131   TRIG 85 11/01/2021 1513   TRIG 274 (H) 07/02/2015 1131   HDL 67 11/01/2021 1513   CHOLHDL 2.7 11/01/2021 1513   VLDL 55 (H) 07/02/2015 1131   LDLCALC 95 11/01/2021 1513    Physical Exam:    VS:  BP 116/68   Pulse 67   Ht 5' 7" (1.702 m)   Wt 183 lb 9.6 oz  (83.3 kg)   SpO2 98%   BMI 28.76 kg/m     Wt Readings from Last 3 Encounters:  01/07/23 183 lb 9.6 oz (83.3 kg)  11/19/22 195 lb (88.5 kg)  06/10/22 189 lb (85.7 kg)     GEN: Patient is in no acute distress HEENT: Normal NECK: No JVD; No carotid bruits LYMPHATICS: No lymphadenopathy CARDIAC: S1 S2 regular, 2/6 systolic murmur at the apex. RESPIRATORY:  Clear to auscultation without rales, wheezing or rhonchi  ABDOMEN: Soft, non-tender, non-distended MUSCULOSKELETAL:  No edema; No deformity  SKIN: Warm and dry NEUROLOGIC:  Alert and oriented x 3 PSYCHIATRIC:  Normal affect    Signed, Devarius Nelles R Nehemiah Montee, MD  01/07/2023 10:36 AM    [facility]   

## 2023-01-07 NOTE — Progress Notes (Signed)
Cardiology Office Note:    Date:  01/07/2023   ID:  Kim [name], DOB Feb 15, 1966, MRN [id number]  PCP:  Larae Grooms, NP  Cardiologist:  Garwin Brothers, MD   Referring MD: Sanjuana Letters, FNP    ASSESSMENT:    1. Aortic atherosclerosis   2. Angina pectoris   3. Mixed dyslipidemia   4. Cigarette smoker    PLAN:    In order of problems listed above:  Angina pectoris: Patient's symptoms are significant.  Following recommendations were made to the patient.  She was advised to take a coated baby aspirin on daily basis.  Sublingual nitroglycerin prescription was sent, its protocol and 911 protocol explained and the patient vocalized understanding questions were answered to the patient's satisfaction.In view of the patient's symptoms, I discussed with the patient options for evaluation. Invasive and noninvasive options were given to the patient. I discussed stress testing and coronary angiography and left heart catheterization at length. Benefits, pros and cons of each approach were discussed at length. Patient had multiple questions which were answered to the patient's satisfaction. Patient opted for invasive evaluation and we will set up for coronary angiography and left heart catheterization. Further recommendations will be made based on the findings with coronary angiography. In the interim if the patient has any significant symptoms in hospital to the nearest emergency room.  I mention CT coronary angiography and she is not keen on it. Makes dyslipidemia and aortic atherosclerosis: On lipid-lowering medications followed by primary care.  Diet emphasized. Cigarette smoker: I spent 5 minutes with the patient discussing solely about smoking. Smoking cessation was counseled. I suggested to the patient also different medications and pharmacological interventions. Patient is keen to try stopping on its own at this time. He will get back to me if he needs any further assistance in this  matter. Patient seen in follow-up appointment after coronary angiography.   Medication Adjustments/Labs and Tests Ordered: Current medicines are reviewed at length with the patient today.  Concerns regarding medicines are outlined above.  No orders of the defined types were placed in this encounter.  No orders of the defined types were placed in this encounter.    History of Present Illness:    Kim [name] is a 57 y.o. female who is being seen today for the evaluation of chest pain on exertion at the request of Sanjuana Letters, FNP.  Patient is a pleasant 57 year old female.  She has past medical history of heavy cigarette smoking and COPD.  She smokes 2 to 3 packs a day or more.  She has history of mixed dyslipidemia.  She mentions to me that every time she exerts a little bit she feels tight in the chest and going to the neck.  She is concerned about it.  No orthopnea or PND.  The symptoms have stressed her abdomen therefore she is here for evaluation.  She has history of mixed dyslipidemia.  No history of diabetes mellitus.  At the time of my evaluation, the patient is alert awake oriented and in no distress.  Past Medical History:  Diagnosis Date   Abnormal drug screen (01/08/2022 UDS) 02/26/2022   (01/08/2022) abnormal UDS (+) for carboxy-THC (marijuana)    Abnormal MRI, cervical spine (01/23/2022) 02/26/2022   (01/23/2022) MRI CERVICAL SPINE FINDINGS:  Alignment: Mild reversal the normal cervical lordosis.     DISC LEVELS:  C3-4: Mild bilateral facet arthropathy  C4-5: Minimal disc bulge and mild bilateral facet arthropathy  IMPRESSION:  1. Mild multilevel degenerative change without significant canal or foraminal stenosis.   Abnormal MRI, lumbar spine (10/28/2021) 12/23/2021   (10/28/2021) LUMBAR MRI FINDINGS:  Alignment: Is trace retrolisthesis of L2 on L3 and L3 on L4, not significantly changed. Alignment is otherwise normal.  Vertebrae: There is anterior compression deformity  of the T12 vertebral body with up to approximately 25% loss of vertebral body height anteriorly, not significantly changed since 2021. The other vertebral body heights are preserved. There is mi   Abnormal MRI, thoracic spine (01/23/2022) 02/26/2022   (01/23/2022) MRI THORACIC SPINE FINDINGS:  Alignment: Mildly exaggerated thoracic kyphosis at T11-T12 due to T12 fracture described below.  Vertebrae: Remote T12 compression fracture, unchanged from 10/28/2021 MRI      DISC LEVELS:  Mild disc bulge at T11-12      IMPRESSION:  1. Mild multilevel degenerative change without significant canal or foraminal stenosis.  2. Remote T12 fracture with similar    Aortic atherosclerosis 01/14/2021   Arthritis    Atherosclerotic heart disease of native coronary artery without angina pectoris    Bilateral stenosis of lateral recess of lumbar spine    Bipolar disorder    Carpal tunnel syndrome (Bilateral) 01/08/2022   Centrilobular emphysema 02/21/2016   Formatting of this note might be different from the original.  Last Assessment & Plan:   Formatting of this note might be different from the original.  Ongoing. Not well controlled.  Saw Pulmonology whom she felt didn't help her cough.  Requesting new referral to see a different Pulmonologist.  Referral was placed today during the visit.   Cervical facet syndrome 01/08/2022   Cervical radiculopathy 08/01/2020   Cervicalgia 01/08/2022   Cervicogenic headache 01/08/2022   Chronic cough 03/16/2022   Chronic hip pain (2ry area of Pain) (Bilateral) (L>R) 01/08/2022   Chronic low back pain (1ry area of Pain) (Bilateral) (L>R) w/o sciatica 07/04/2021   Chronic lower extremity pain (3ry area of Pain) (Intermittent) (Left) 01/08/2022   Chronic neck and back pain (5th area of Pain) (Midline) 01/08/2022   Chronic obstructive pulmonary disease 06/25/2011   Active smoker  - 04/30/2021  After extensive coaching inhaler device,  effectiveness =    90% with smi > resume stiolto      Formatting of this note might be different from the original.  Formatting of this note might be different from the original.  Active smoker  - 04/30/2021  After extensive coaching inhaler device,  effectiveness =    90% with smi > resume stiolto     Last Assessment & Plan:   Fo   Chronic pain syndrome 12/23/2021   Chronic sacroiliac joint pain (Bilateral) 02/26/2022   Chronic shoulder pain (6th area of Pain) (Bilateral) 01/08/2022   Chronic thoracic back pain (4th area of Pain) (Midline) (Bilateral) 01/08/2022   Cigarette smoker 05/01/2021   DDD (degenerative disc disease), cervical 12/23/2021   DDD (degenerative disc disease), lumbosacral 12/23/2021   (10/28/2021) LUMBAR MRI FINDINGS:  LEVELS:  There is disc desiccation without significant loss of height at L3-L4 and L4-L5. There is disc desiccation and mild narrowing at L1-L2, L2-L3, and L5-S1.  L1-2: central protrusion  L2-3: disc bulge  L3-4: disc bulge with a left foraminal component and annular fissure  L4-5: diffuse disc bulge with a central annular fissure  L5-S1: diffuse disc bulge with   DDD (degenerative disc disease), thoracic 12/23/2021   Disorder of skeletal system 12/23/2021   Elevated alkaline phosphatase level 02/26/2022   (01/08/2022)-  Normal ALP (Alkaline phosphatase) levels are between 44 -121 IU/L, for our Lab. High ALP could suggest liver damage or increased bone cell activity. If other tests such as bilirubin, aspartate aminotransferase (AST), or alanine aminotransferase (ALT) are also high, usually the increased ALP is coming from the liver. Higher-than-normal ALP levels can be seen with: biliary obstruction;    Failure to attend appointment 10/20/2022   GERD (gastroesophageal reflux disease)    Grade 1 Retrolisthesis of cervical region (2 mm) (C4/C5) 12/23/2021   Grade 1 Retrolisthesis of L2/L3 and L3/L4 01/08/2022   (10/28/2021) LUMBAR MRI FINDINGS:  Alignment: Is trace retrolisthesis of L2 on L3 and L3 on L4.  LEVELS:  L2-3:  There is a mild disc bulge and mild facet arthropathy  L3-4: There is a mild disc bulge with a left foraminal component and annular fissure and mild left worse than right facet arthropathy resulting in narrowing of the left subarticular zone with crowding of the traversing nerve root, an   Headache disorder 09/20/2020   History of ileus 02/19/2021   History of marijuana use 02/26/2022   (01/08/2022) abnormal UDS (+) for carboxy-THC (marijuana)   The electronic medical record also indicated positive results on 10/09/2012 and 11/25/2013.   Leg pain    bilateral   Lumbar disc annular tears 01/08/2022   (10/28/2021) LUMBAR MRI FINDINGS:  LEVELS:  L3-4: There is a mild disc bulge with a left foraminal component and annular fissure  L4-5: There is a diffuse disc bulge with a central annular fissure   Lumbar facet syndrome (Bilateral) 01/08/2022   Lumbar lateral recess stenosis (L3-4) (Left) 12/23/2021   Lumbar radiculopathy 08/01/2020   Lumbosacral facet arthropathy (Multilevel) (Bilateral) 12/23/2021   (10/28/2021) LUMBAR MRI FINDINGS:  LEVELS:  L2-3: mild facet arthropathy  L3-4: mild left worse than right facet arthropathy.  L4-5: bilateral facet arthropathy  L5-S1: bilateral facet arthropathy. There are trace bilateral facet joint effusions with mild perifacetal soft tissue edema at this level which could reflect a source of pain.     There is facet arthropathy most advanced at L4-L5 and L5-S   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) (Bilateral: L5-S1) 12/23/2021   (10/28/2021) LUMBAR MRI FINDINGS:  LEVELS:  L3-4: mild disc bulge with a left foraminal component resulting in mild left and no significant right neural foraminal stenosis. There is possible contact of the exiting left L3 nerve root by foraminal disc material.  L5-S1: diffuse disc bulge with bilateral foraminal components resulting in mild-to-moderate bilateral neural foraminal stenosis.   Lumbosacral lateral recess stenosis (L3-4, L5-S1)  (Left) 01/08/2022   (10/28/2021) LUMBAR MRI FINDINGS:  LEVELS:  L3-4: narrowing of the left subarticular zone. There is possible contact of the exiting left L3 nerve root by foraminal disc material.  L5-S1: mild crowding of the left subarticular zone   Marijuana use 02/26/2022   (01/08/2022) abnormal UDS (+) for carboxy-THC (marijuana)    Migraines 07/02/2015   Multiple pulmonary nodules determined by computed tomography of lung 05/01/2021   Active smoker  03/07/21 (vs 12/04/20) new subpleural  area of airspace consolidation within the anterior basal right upper  lobe abutting the major fissure. This area has a mean derived  - CT 07/17/2021 lesions have either resolved or now change c/w inflammatory etiology plus emphysema  > repeat 18 mplaced  in reminder file      Formatting of this note might be different from the original.  Formattin   Nicotine dependence, cigarettes, w unsp disorders 06/25/2011   Numbness and  tingling in hands (Bilateral) 01/08/2022   Occipital headache (Bilateral) 01/08/2022   Other specified chronic obstructive pulmonary disease 06/25/2011   Other spondylosis, cervical region (C4/C5 and C5/C6) 12/23/2021   Pharmacologic therapy 12/23/2021   Problems influencing health status 12/23/2021   PTSD (post-traumatic stress disorder) 04/05/2020   Rectal bleeding 02/19/2021   RLS (restless legs syndrome) 07/02/2015   Spondylosis without myelopathy or radiculopathy, lumbosacral region 02/26/2022   Stress incontinence 11/01/2021   T12 compression fracture, sequela 08/01/2020   Thoracic facet syndrome 01/08/2022   Upper airway cough syndrome 05/01/2021   Onset 2021   - cyclical cough rx  04/30/2021 >>>        Past Surgical History:  Procedure Laterality Date   APPENDECTOMY  1986   breast surgey Left    Cyst Excision   CARPAL TUNNEL RELEASE     COLOSTOMY REVISION  2014   TOTAL ABDOMINAL HYSTERECTOMY W/ BILATERAL SALPINGOOPHORECTOMY  2008   endometriosis    Current  Medications: Current Meds  Medication Sig   albuterol (PROVENTIL) (2.5 MG/3ML) 0.083% nebulizer solution Take 2.5 mg by nebulization every 6 (six) hours as needed for wheezing or shortness of breath.   fluticasone (FLONASE) 50 MCG/ACT nasal spray shake liquid AND INSTILL 1 SPRAY IN EACH NOSTRIL DAILY   ondansetron (ZOFRAN-ODT) 8 MG disintegrating tablet DISSOLVE 1 TABLET BY MOUTH EVERY 8 HOURS AS NEEDED FOR NAUSEA OR VOMITING   pregabalin (LYRICA) 50 MG capsule Take 50 mg by mouth daily.   rOPINIRole (REQUIP) 4 MG tablet Take 4 mg by mouth at bedtime.   rosuvastatin (CRESTOR) 5 MG tablet Take 1 tablet (5 mg total) by mouth daily.   SUMAtriptan Succinate 4 MG/0.5ML SOAJ Inject 4 mg into the skin daily as needed (migraine).     Allergies:   Penicillins and Sulfa antibiotics   Social History   Socioeconomic History   Marital status: Divorced    Spouse name: Not on file   Number of children: 1   Years of education: HS   Highest education level: Not on file  Occupational History   Occupation: Disabled  Tobacco Use   Smoking status: Every Day    Packs/day: 3.00    Years: 39.00    Additional pack years: 0.00    Total pack years: 117.00    Types: Cigarettes   Smokeless tobacco: Never   Tobacco comments:    3.5-4pdd 04/30/2021  Vaping Use   Vaping Use: Former  Substance and Sexual Activity   Alcohol use: Yes    Alcohol/week: 42.0 standard drinks of alcohol    Types: 42 Cans of beer per week   Drug use: Not Currently    Types: Marijuana   Sexual activity: Not Currently  Other Topics Concern   Not on file  Social History Narrative   Lives at home alone.   Drinks two pots of coffee per day.   Right-handed.   Social Determinants of Health   Financial Resource Strain: Not on file  Food Insecurity: Food Insecurity Present (11/14/2021)   Hunger Vital Sign    Worried About Running Out of Food in the Last Year: Sometimes true    Ran Out of Food in the Last Year: Sometimes true   Transportation Needs: No Transportation Needs (11/14/2021)   PRAPARE - Administrator, Civil Service (Medical): No    Lack of Transportation (Non-Medical): No  Physical Activity: Insufficiently Active (12/05/2021)   Exercise Vital Sign    Days of Exercise per Week: 7  days    Minutes of Exercise per Session: 20 min  Stress: Stress Concern Present (08/22/2021)   [employer] of Occupational Health - Occupational Stress Questionnaire    Feeling of Stress : Rather much  Social Connections: Socially Isolated (06/14/2021)   Social Connection and Isolation Panel [NHANES]    Frequency of Communication with Friends and Family: More than three times a week    Frequency of Social Gatherings with Friends and Family: More than three times a week    Attends Religious Services: Never    Database administrator or Organizations: No    Attends Engineer, structural: Never    Marital Status: Divorced     Family History: The patient's family history includes Alcohol abuse in her father; Heart attack in her brother, father, maternal grandmother, and paternal grandmother; Heart disease in her brother; Seizures in her daughter.  ROS:   Please see the history of present illness.    All other systems reviewed and are negative.  EKGs/Labs/Other Studies Reviewed:    The following studies were reviewed today: EKG revealed sinus rhythm and nonspecific ST-T changes   Recent Labs: 01/08/2022: Magnesium 2.1 05/26/2022: ALT 25; BUN 8; Creatinine, Ser 0.71; Hemoglobin 15.8; Platelets 268; Potassium 4.2; Sodium 141  Recent Lipid Panel    Component Value Date/Time   CHOL 178 11/01/2021 1513   CHOL 211 (H) 07/02/2015 1131   TRIG 85 11/01/2021 1513   TRIG 274 (H) 07/02/2015 1131   HDL 67 11/01/2021 1513   CHOLHDL 2.7 11/01/2021 1513   VLDL 55 (H) 07/02/2015 1131   LDLCALC 95 11/01/2021 1513    Physical Exam:    VS:  BP 116/68   Pulse 67   Ht  (1.702 m)   Wt 183 lb 9.6 oz  (83.3 kg)   SpO2 98%   BMI 28.76 kg/m     Wt Readings from Last 3 Encounters:  01/07/23 183 lb 9.6 oz (83.3 kg)  11/19/22 195 lb (88.5 kg)  06/10/22 189 lb (85.7 kg)     GEN: Patient is in no acute distress HEENT: Normal NECK: No JVD; No carotid bruits LYMPHATICS: No lymphadenopathy CARDIAC: S1 S2 regular, 2/6 systolic murmur at the apex. RESPIRATORY:  Clear to auscultation without rales, wheezing or rhonchi  ABDOMEN: Soft, non-tender, non-distended MUSCULOSKELETAL:  No edema; No deformity  SKIN: Warm and dry NEUROLOGIC:  Alert and oriented x 3 PSYCHIATRIC:  Normal affect    Signed, Garwin Brothers, MD  01/07/2023 10:36 AM    [facility]

## 2023-01-08 ENCOUNTER — Telehealth: Payer: Self-pay | Admitting: *Deleted

## 2023-01-08 LAB — BASIC METABOLIC PANEL
BUN/Creatinine Ratio: 18 (ref 9–23)
BUN: 12 mg/dL (ref 6–24)
CO2: 19 mmol/L — ABNORMAL LOW (ref 20–29)
Calcium: 9.2 mg/dL (ref 8.7–10.2)
Chloride: 106 mmol/L (ref 96–106)
Creatinine, Ser: 0.66 mg/dL (ref 0.57–1.00)
Glucose: 97 mg/dL (ref 70–99)
Potassium: 4.4 mmol/L (ref 3.5–5.2)
Sodium: 139 mmol/L (ref 134–144)
eGFR: 103 mL/min/{1.73_m2} (ref >59)

## 2023-01-08 LAB — CBC
Hematocrit: 43.8 % (ref 34.0–46.6)
Hemoglobin: 15.2 g/dL (ref 11.1–15.9)
MCH: 32 pg (ref 26.6–33.0)
MCHC: 34.7 g/dL (ref 31.5–35.7)
MCV: 92 fL (ref 79–97)
Platelets: 239 10*3/uL (ref 150–450)
RBC: 4.75 x10E6/uL (ref 3.77–5.28)
RDW: 12 % (ref 11.7–15.4)
WBC: 5.4 10*3/uL (ref 3.4–10.8)

## 2023-01-08 NOTE — Telephone Encounter (Addendum)
Cardiac Catheterization scheduled at [facility] for: Monday January 12, 2023 9 AM Arrival time [facility] Main Entrance A at: 7 AM  Nothing to eat after midnight prior to procedure, clear liquids until 5 AM day of procedure.  Medication instructions: -Usual morning medications can be taken with sips of water including aspirin 81 mg. Patient reports she takes 6-7 [facility] Powder packs 845-65  per day. She states she will need to take [facility] Powder pack for migraine headache before going to hospital morning of procedure, so she will not take aspirin 81 mg.  Confirmed patient has responsible adult to drive home post procedure and be with patient first 24 hours after arriving home.  Plan to go home the same day, you will only stay overnight if medically necessary.  Reviewed procedure instructions with patient.

## 2023-01-12 ENCOUNTER — Ambulatory Visit (HOSPITAL_COMMUNITY)
Admission: RE | Admit: 2023-01-12 | Discharge: 2023-01-12 | Disposition: A | Discharge status: Home / Self Care | Payer: Medicaid Other | Source: Ambulatory Visit | Attending: Cardiovascular Disease | Admitting: Cardiovascular Disease

## 2023-01-12 ENCOUNTER — Encounter (HOSPITAL_COMMUNITY)
Admission: RE | Disposition: A | Discharge status: Home / Self Care | Payer: Self-pay | Source: Ambulatory Visit | Attending: Cardiovascular Disease

## 2023-01-12 ENCOUNTER — Encounter (HOSPITAL_COMMUNITY): Payer: Self-pay | Admitting: Cardiovascular Disease

## 2023-01-12 ENCOUNTER — Other Ambulatory Visit: Payer: Self-pay

## 2023-01-12 DIAGNOSIS — F17210 Nicotine dependence, cigarettes, uncomplicated: Secondary | ICD-10-CM | POA: Insufficient documentation

## 2023-01-12 DIAGNOSIS — I700 Atherosclerosis of aorta: Secondary | ICD-10-CM | POA: Insufficient documentation

## 2023-01-12 DIAGNOSIS — I209 Angina pectoris, unspecified: Secondary | ICD-10-CM | POA: Insufficient documentation

## 2023-01-12 DIAGNOSIS — E782 Mixed hyperlipidemia: Secondary | ICD-10-CM | POA: Diagnosis not present

## 2023-01-12 DIAGNOSIS — Z79899 Other long term (current) drug therapy: Secondary | ICD-10-CM | POA: Diagnosis not present

## 2023-01-12 DIAGNOSIS — R079 Chest pain, unspecified: Secondary | ICD-10-CM | POA: Diagnosis not present

## 2023-01-12 HISTORY — PX: LEFT HEART CATH AND CORONARY ANGIOGRAPHY: CATH118249

## 2023-01-12 SURGERY — LEFT HEART CATH AND CORONARY ANGIOGRAPHY
Anesthesia: LOCAL

## 2023-01-12 MED ORDER — VERAPAMIL HCL 2.5 MG/ML IV SOLN
INTRA_ARTERIAL | Status: DC | PRN
  Administered 2023-01-12: 15 mL via INTRA_ARTERIAL

## 2023-01-12 MED ORDER — LIDOCAINE HCL (PF) 1 % IJ SOLN
INTRAMUSCULAR | Status: AC
  Filled 2023-01-12: qty 30

## 2023-01-12 MED ORDER — HEPARIN SODIUM (PORCINE) 1000 UNIT/ML IJ SOLN
INTRAMUSCULAR | Status: DC | PRN
  Administered 2023-01-12: 4000 [IU] via INTRAVENOUS

## 2023-01-12 MED ORDER — SODIUM CHLORIDE 0.9 % IV SOLN: 250.0000 mL | INTRAVENOUS | Status: DC | PRN

## 2023-01-12 MED ORDER — HYDRALAZINE HCL 20 MG/ML IJ SOLN: 10.0000 mg | INTRAMUSCULAR | Status: DC | PRN

## 2023-01-12 MED ORDER — MIDAZOLAM HCL 2 MG/2ML IJ SOLN
INTRAMUSCULAR | Status: DC | PRN
  Administered 2023-01-12 (×2): 1 mg via INTRAVENOUS

## 2023-01-12 MED ORDER — HEPARIN (PORCINE) IN NACL 1000-0.9 UT/500ML-% IV SOLN
INTRAVENOUS | Status: DC | PRN
  Administered 2023-01-12 (×2): 500 mL

## 2023-01-12 MED ORDER — SODIUM CHLORIDE 0.9% FLUSH: 3.0000 mL | Freq: Two times a day (BID) | INTRAVENOUS | Status: DC

## 2023-01-12 MED ORDER — ASPIRIN 81 MG PO CHEW: 81.0000 mg | CHEWABLE_TABLET | ORAL | Status: DC

## 2023-01-12 MED ORDER — NITROGLYCERIN 1 MG/10 ML FOR IR/CATH LAB
INTRA_ARTERIAL | Status: AC
  Filled 2023-01-12: qty 10

## 2023-01-12 MED ORDER — SODIUM CHLORIDE 0.9 % IV SOLN: INTRAVENOUS | Status: DC

## 2023-01-12 MED ORDER — FENTANYL CITRATE (PF) 100 MCG/2ML IJ SOLN
INTRAMUSCULAR | Status: DC | PRN
  Administered 2023-01-12 (×2): 25 ug via INTRAVENOUS

## 2023-01-12 MED ORDER — SODIUM CHLORIDE 0.9% FLUSH: 3.0000 mL | INTRAVENOUS | Status: DC | PRN

## 2023-01-12 MED ORDER — LIDOCAINE HCL (PF) 1 % IJ SOLN
INTRAMUSCULAR | Status: DC | PRN
  Administered 2023-01-12: 2 mL

## 2023-01-12 MED ORDER — HEPARIN SODIUM (PORCINE) 1000 UNIT/ML IJ SOLN
INTRAMUSCULAR | Status: AC
  Filled 2023-01-12: qty 10

## 2023-01-12 MED ORDER — ONDANSETRON HCL 4 MG/2ML IJ SOLN: 4.0000 mg | Freq: Four times a day (QID) | INTRAMUSCULAR | Status: DC | PRN

## 2023-01-12 MED ORDER — SODIUM CHLORIDE 0.9 % WEIGHT BASED INFUSION: 1.0000 mL/kg/h | INTRAVENOUS | Status: DC

## 2023-01-12 MED ORDER — MIDAZOLAM HCL 2 MG/2ML IJ SOLN
INTRAMUSCULAR | Status: AC
  Filled 2023-01-12: qty 2

## 2023-01-12 MED ORDER — LABETALOL HCL 5 MG/ML IV SOLN: 10.0000 mg | INTRAVENOUS | Status: DC | PRN

## 2023-01-12 MED ORDER — IOHEXOL 350 MG/ML SOLN
INTRAVENOUS | Status: DC | PRN
  Administered 2023-01-12: 50 mL

## 2023-01-12 MED ORDER — MORPHINE SULFATE (PF) 2 MG/ML IV SOLN: 2.0000 mg | INTRAVENOUS | Status: DC | PRN

## 2023-01-12 MED ORDER — FENTANYL CITRATE (PF) 100 MCG/2ML IJ SOLN
INTRAMUSCULAR | Status: AC
  Filled 2023-01-12: qty 2

## 2023-01-12 MED ORDER — VERAPAMIL HCL 2.5 MG/ML IV SOLN
INTRAVENOUS | Status: AC
  Filled 2023-01-12: qty 2

## 2023-01-12 MED ORDER — SODIUM CHLORIDE 0.9 % WEIGHT BASED INFUSION
3.0000 mL/kg/h | INTRAVENOUS | Status: AC
  Administered 2023-01-12: 3 mL/kg/h via INTRAVENOUS

## 2023-01-12 MED ORDER — ACETAMINOPHEN 325 MG PO TABS: 650.0000 mg | ORAL_TABLET | ORAL | Status: DC | PRN

## 2023-01-12 SURGICAL SUPPLY — 12 items
CATH INFINITI JR4 5F (CATHETERS) IMPLANT
CATH OPTITORQUE TIG 4.0 5F (CATHETERS) IMPLANT
DEVICE RAD TR BAND REGULAR (VASCULAR PRODUCTS) IMPLANT
GLIDESHEATH SLEND A-KIT 6F 22G (SHEATH) IMPLANT
GUIDEWIRE INQWIRE 1.5J.035X260 (WIRE) IMPLANT
INQWIRE 1.5J .035X260CM (WIRE) ×1
KIT HEART LEFT (KITS) ×1 IMPLANT
PACK CARDIAC CATHETERIZATION (CUSTOM PROCEDURE TRAY) ×1 IMPLANT
SHEATH PROBE COVER 6X72 (BAG) IMPLANT
TRANSDUCER W/STOPCOCK (MISCELLANEOUS) ×1 IMPLANT
TUBING CIL FLEX 10 FLL-RA (TUBING) ×1 IMPLANT
WIRE HI TORQ VERSACORE-J 145CM (WIRE) IMPLANT

## 2023-01-12 NOTE — Progress Notes (Signed)
Patient is not wanting her blood pressure taking. She is also wanting her TRB off. She stated that she is going to take it off herself. I educated her on the importance of keeping it on.

## 2023-01-12 NOTE — Progress Notes (Signed)
Patient and ex husband was given discharge instructions. Both verbalized understanding.

## 2023-01-12 NOTE — Progress Notes (Signed)
Another nurse just came to inform me that patient is taking her IV's out. Patient TRB can come off at 11:15.

## 2023-01-12 NOTE — Interval H&P Note (Signed)
Cath Lab Visit (complete for each Cath Lab visit)  Clinical Evaluation Leading to the Procedure:   ACS: No.  Non-ACS:    Anginal Classification: CCS II  Anti-ischemic medical therapy: Minimal Therapy (1 class of medications)  Non-Invasive Test Results: No non-invasive testing performed  Prior CABG: No previous CABG      History and Physical Interval Note:  01/12/2023 8:42 AM  Rosana Fret  has presented today for surgery, with the diagnosis of angina.  The various methods of treatment have been discussed with the patient and family. After consideration of risks, benefits and other options for treatment, the patient has consented to  Procedure(s): LEFT HEART CATH AND CORONARY ANGIOGRAPHY (N/A) as a surgical intervention.  The patient's history has been reviewed, patient examined, no change in status, stable for surgery.  I have reviewed the patient's chart and labs.  Questions were answered to the patient's satisfaction.     Nanetta Batty

## 2023-01-12 NOTE — Discharge Instructions (Signed)

## 2023-01-18 ENCOUNTER — Other Ambulatory Visit: Payer: Self-pay | Admitting: Nurse Practitioner

## 2023-01-19 NOTE — Telephone Encounter (Signed)
Requested medication (s) are due for refill today: routing for review  Requested medication (s) are on the active medication list: yes  Last refill:  11/25/22  Future visit scheduled: no  Notes to clinic:  Unable to refill per protocol, last refill by another provider.  Historical provider, routing for approval.     Requested Prescriptions  Pending Prescriptions Disp Refills   rOPINIRole (REQUIP) 4 MG tablet [Pharmacy Med Name: ROPINIROLE 4MG  TABLETS] 450 tablet     Sig: TAKE 1 TABLET BY MOUTH FIVE TIMES DAILY     Neurology:  Parkinsonian Agents Passed - 01/18/2023  3:20 AM      Passed - Last BP in normal range    BP Readings from Last 1 Encounters:  01/12/23 112/74         Passed - Last Heart Rate in normal range    Pulse Readings from Last 1 Encounters:  01/12/23 94         Passed - Valid encounter within last 12 months    Recent Outpatient Visits           7 months ago RLS (restless legs syndrome)   [place] [facility] Larae Grooms, NP   7 months ago Aortic atherosclerosis [facility])   [place] [facility] Larae Grooms, NP   11 months ago Blunt trauma to chest, initial encounter   [place] [facility] Mecum, Oswaldo Conroy, PA-C   1 year ago Chronic bilateral low back pain with sciatica, sciatica laterality unspecified   [place] [facility] Larae Grooms, NP   1 year ago Chronic bilateral low back pain with sciatica, sciatica laterality unspecified   [place] [facility] Larae Grooms, NP       Future Appointments             In 1 month Revankar, Aundra Dubin, MD [facility]

## 2023-02-02 ENCOUNTER — Other Ambulatory Visit: Payer: Medicaid Other

## 2023-02-16 ENCOUNTER — Other Ambulatory Visit: Payer: Self-pay

## 2023-02-25 ENCOUNTER — Ambulatory Visit: Payer: Medicaid Other | Admitting: Cardiology

## 2023-06-09 ENCOUNTER — Ambulatory Visit: Payer: Medicaid Other | Admitting: Nurse Practitioner

## 2023-06-15 ENCOUNTER — Ambulatory Visit: Payer: Medicaid Other | Admitting: Family Medicine

## 2023-06-23 ENCOUNTER — Ambulatory Visit: Payer: Medicaid Other | Admitting: Nurse Practitioner

## 2023-06-23 VITALS — BP 118/80 | HR 92 | Temp 97.8°F | Ht 67.0 in | Wt 199.2 lb

## 2023-06-23 DIAGNOSIS — E782 Mixed hyperlipidemia: Secondary | ICD-10-CM

## 2023-06-23 DIAGNOSIS — G43009 Migraine without aura, not intractable, without status migrainosus: Secondary | ICD-10-CM | POA: Diagnosis not present

## 2023-06-23 DIAGNOSIS — G2581 Restless legs syndrome: Secondary | ICD-10-CM | POA: Diagnosis not present

## 2023-06-23 MED ORDER — ROSUVASTATIN CALCIUM 5 MG PO TABS: 5.0000 mg | ORAL_TABLET | Freq: Every day | ORAL | 3 refills | Status: DC

## 2023-06-23 MED ORDER — SUMATRIPTAN SUCCINATE 4 MG/0.5ML ~~LOC~~ SOAJ
4.0000 mg | Freq: Every day | SUBCUTANEOUS | 5 refills | Status: DC | PRN
Start: 2023-06-23 — End: 2023-08-31

## 2023-06-23 MED ORDER — ROPINIROLE HCL 4 MG PO TABS
4.0000 mg | ORAL_TABLET | Freq: Every day | ORAL | 3 refills | Status: DC
Start: 2023-06-23 — End: 2023-12-25

## 2023-06-23 MED ORDER — ONDANSETRON 8 MG PO TBDP
8.0000 mg | ORAL_TABLET | Freq: Three times a day (TID) | ORAL | 3 refills | Status: DC | PRN
Start: 2023-06-23 — End: 2023-12-22

## 2023-06-23 NOTE — Progress Notes (Signed)
Bethanie Dicker, NP-C Phone: [phone]  Kim [name] is a 57 y.o. female who presents today to establish care.   Patient requesting medication refills on her migraine medications, medication for restless leg and cholesterol medication. She is not interested in completing lab work or in Regulatory affairs officer health recommendations. She used to be followed by Cardiology and Pulmonology, she is no longer seeing them and is not interested in following up with their offices.   HYPERLIPIDEMIA Symptoms Chest pain on exertion:  No   Leg claudication:   No Medications: Compliance- Crestor Right upper quadrant pain- No  Muscle aches- No Lipid Panel     Component Value Date/Time   CHOL 178 11/01/2021 1513   CHOL 211 (H) 07/02/2015 1131   TRIG 85 11/01/2021 1513   TRIG 274 (H) 07/02/2015 1131   HDL 67 11/01/2021 1513   CHOLHDL 2.7 11/01/2021 1513   VLDL 55 (H) 07/02/2015 1131   LDLCALC 95 11/01/2021 1513   LABVLDL 16 11/01/2021 1513    Active Ambulatory Problems    Diagnosis Date Noted   RLS (restless legs syndrome) 07/02/2015   Migraines 07/02/2015   Chronic obstructive pulmonary disease (HCC) 06/25/2011   Nicotine dependence, cigarettes, w unsp disorders 06/25/2011   Centrilobular emphysema (HCC) 02/21/2016   GERD (gastroesophageal reflux disease)    PTSD (post-traumatic stress disorder) 04/05/2020   Lumbar radiculopathy 08/01/2020   Cervical radiculopathy 08/01/2020   T12 compression fracture, sequela 08/01/2020   Aortic atherosclerosis (HCC) 01/14/2021   Rectal bleeding 02/19/2021   History of ileus 02/19/2021   Headache disorder 09/20/2020   Upper airway cough syndrome 05/01/2021   Cigarette smoker 05/01/2021   Multiple pulmonary nodules determined by computed tomography of lung 05/01/2021   Chronic low back pain (1ry area of Pain) (Bilateral) (L>R) w/o sciatica 07/04/2021   Stress incontinence 11/01/2021   Chronic pain syndrome 12/23/2021   Pharmacologic  therapy 12/23/2021   Disorder of skeletal system 12/23/2021   Problems influencing health status 12/23/2021   Abnormal MRI, lumbar spine (10/28/2021) 12/23/2021   Other spondylosis, cervical region (C4/C5 and C5/C6) 12/23/2021   Grade 1 Retrolisthesis of cervical region (2 mm) (C4/C5) 12/23/2021   DDD (degenerative disc disease), cervical 12/23/2021   DDD (degenerative disc disease), lumbosacral 12/23/2021   DDD (degenerative disc disease), thoracic 12/23/2021   Lumbosacral facet arthropathy (Multilevel) (Bilateral) 12/23/2021   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) (Bilateral: L5-S1) 12/23/2021   Lumbar lateral recess stenosis (L3-4) (Left) 12/23/2021   Grade 1 Retrolisthesis of L2/L3 and L3/L4 01/08/2022   Lumbar disc annular tears 01/08/2022   Lumbosacral lateral recess stenosis (L3-4, L5-S1) (Left) 01/08/2022   Chronic hip pain (2ry area of Pain) (Bilateral) (L>R) 01/08/2022   Chronic lower extremity pain (3ry area of Pain) (Intermittent) (Left) 01/08/2022   Chronic thoracic back pain (4th area of Pain) (Midline) (Bilateral) 01/08/2022   Chronic neck and back pain (5th area of Pain) (Midline) 01/08/2022   Cervicalgia 01/08/2022   Cervicogenic headache 01/08/2022   Occipital headache (Bilateral) 01/08/2022   Chronic shoulder pain (6th area of Pain) (Bilateral) 01/08/2022   Carpal tunnel syndrome (Bilateral) 01/08/2022   Numbness and tingling in hands (Bilateral) 01/08/2022   Lumbar facet syndrome (Bilateral) 01/08/2022   Thoracic facet syndrome 01/08/2022   Cervical facet syndrome 01/08/2022   Abnormal drug screen (01/08/2022 UDS) 02/26/2022   Marijuana use 02/26/2022   History of marijuana use 02/26/2022   Elevated alkaline phosphatase level 02/26/2022   Spondylosis without myelopathy or radiculopathy, lumbosacral region 02/26/2022   Chronic  sacroiliac joint pain (Bilateral) 02/26/2022   Abnormal MRI, cervical spine (01/23/2022) 02/26/2022   Abnormal MRI, thoracic spine  (01/23/2022) 02/26/2022   Chronic cough 03/16/2022   Other specified chronic obstructive pulmonary disease (HCC) 06/25/2011   Failure to attend appointment 10/20/2022   Arthritis 12/24/2022   Bipolar disorder (HCC) 12/24/2022   Leg pain 12/24/2022   Atherosclerotic heart disease of native coronary artery without angina pectoris 12/24/2022   Bilateral stenosis of lateral recess of lumbar spine 12/24/2022   Angina pectoris (HCC) 01/07/2023   Mixed dyslipidemia 01/07/2023   Resolved Ambulatory Problems    Diagnosis Date Noted   Weight gain 07/02/2015   Chronic abdominal pain 11/09/2015   Bipolar depression (HCC) 04/05/2020   Rebound headache 09/20/2020   Past Medical History:  Diagnosis Date   Allergy    Asthma    Depression    Emphysema of lung (HCC)     Family History  Problem Relation Age of Onset   Alcohol abuse Mother    Anxiety disorder Mother    Depression Mother    Early death Mother    Varicose Veins Mother    Alcohol abuse Father    Heart attack Father    Heart disease Brother    Heart attack Brother    Seizures Daughter    Heart attack Maternal Grandmother    Heart disease Maternal Grandmother    Heart attack Paternal Grandmother    Alcohol abuse Brother    Depression Brother    Early death Brother     Social History   Socioeconomic History   Marital status: Divorced    Spouse name: Not on file   Number of children: 1   Years of education: HS   Highest education level: Some college, no degree  Occupational History   Occupation: Disabled  Tobacco Use   Smoking status: Every Day    Current packs/day: 3.00    Average packs/day: 3.0 packs/day for 44.0 years (132.0 ttl pk-yrs)    Types: Cigarettes, Pipe, Cigars   Smokeless tobacco: Never   Tobacco comments:    3.5-4pdd 04/30/2021  Vaping Use   Vaping status: Former  Substance and Sexual Activity   Alcohol use: Yes    Alcohol/week: 42.0 standard drinks of alcohol    Types: 42 Cans of beer per week    Drug use: Yes    Frequency: 7.0 times per week    Types: Marijuana   Sexual activity: Not Currently    Birth control/protection: Other-see comments    Comment: Menopausal Hysterectomy 2008  Other Topics Concern   Not on file  Social History Narrative   Lives at home alone.   Drinks two pots of coffee per day.   Right-handed.   Social Determinants of Health   Financial Resource Strain: High Risk (06/23/2023)   Overall Financial Resource Strain (CARDIA)    Difficulty of Paying Living Expenses: Very hard  Food Insecurity: No Food Insecurity (06/23/2023)   Hunger Vital Sign    Worried About Running Out of Food in the Last Year: Never true    Ran Out of Food in the Last Year: Never true  Transportation Needs: No Transportation Needs (06/23/2023)   PRAPARE - Administrator, Civil Service (Medical): No    Lack of Transportation (Non-Medical): No  Physical Activity: Sufficiently Active (06/23/2023)   Exercise Vital Sign    Days of Exercise per Week: 7 days    Minutes of Exercise per Session: 150+ min  Stress: Stress  Concern Present (06/23/2023)   [employer] of Occupational Health - Occupational Stress Questionnaire    Feeling of Stress : Very much  Social Connections: Socially Isolated (06/23/2023)   Social Connection and Isolation Panel [NHANES]    Frequency of Communication with Friends and Family: Once a week    Frequency of Social Gatherings with Friends and Family: Never    Attends Religious Services: Never    Diplomatic Services operational officer: No    Attends Engineer, structural: Not on file    Marital Status: Divorced  Catering manager Violence: Not on file    ROS  General:  Negative for nexplained weight loss, fever Skin: Negative for new or changing mole, sore that won't heal HEENT: Negative for trouble hearing, trouble seeing, ringing in ears, mouth sores, hoarseness, change in voice, dysphagia. CV:  Negative for chest pain, dyspnea,  edema, palpitations Resp: Negative for cough, dyspnea, hemoptysis GI: Negative for nausea, vomiting, diarrhea, constipation, abdominal pain, melena, hematochezia. GU: Negative for dysuria, incontinence, urinary hesitance, hematuria, vaginal or penile discharge, polyuria, sexual difficulty, lumps in testicle or breasts MSK: Negative for muscle cramps or aches, joint pain or swelling Neuro: Negative for headaches, weakness, numbness, dizziness, passing out/fainting Psych: Negative for depression, anxiety, memory problems  Objective  Physical Exam Vitals:   06/23/23 1007  BP: 118/80  Pulse: 92  Temp: 97.8 F (36.6 C)  SpO2: 95%    BP Readings from Last 3 Encounters:  06/23/23 118/80  01/12/23 112/74  01/07/23 116/68   Wt Readings from Last 3 Encounters:  06/23/23 199 lb 3.2 oz (90.4 kg)  01/12/23 185 lb (83.9 kg)  01/07/23 183 lb 9.6 oz (83.3 kg)    Physical Exam Constitutional:      General: She is not in acute distress.    Appearance: Normal appearance.  HENT:     Head: Normocephalic.  Cardiovascular:     Rate and Rhythm: Normal rate and regular rhythm.     Heart sounds: Normal heart sounds.  Pulmonary:     Effort: Pulmonary effort is normal.     Breath sounds: Normal breath sounds.  Skin:    General: Skin is warm and dry.  Neurological:     General: No focal deficit present.     Mental Status: She is alert.  Psychiatric:        Mood and Affect: Mood normal.        Behavior: Behavior normal.    Assessment/Plan:   Mixed dyslipidemia Assessment & Plan: Chronic. Stable on Crestor 5 mg daily. Continue. Refills sent. Declined lipid panel today. Encouraged healthy diet and exercise. The 10-year ASCVD risk score (Arnett DK, et al., 2019) is: 3.3%.  Orders: -     Rosuvastatin Calcium; Take 1 tablet (5 mg total) by mouth daily.  Dispense: 90 tablet; Refill: 3  RLS (restless legs syndrome) Assessment & Plan: Chronic issue. Stable on Requip 4 mg - 1 tablet 5 times  daily. Continue. Refills sent.   Orders: -     rOPINIRole HCl; Take 1 tablet (4 mg total) by mouth 5 (five) times daily.  Dispense: 450 tablet; Refill: 3  Migraine without aura and without status migrainosus, not intractable Assessment & Plan: Chronic issue. Stable with Imitrex injection PRN. Continue. Refills sent. She does not have to use often. Advised to contact if worsening or changing migraines.   Orders: -     SUMAtriptan Succinate; Inject 4 mg into the skin daily as needed (migraine).  Dispense:  0.5 mL; Refill: 5 -     Ondansetron; Take 1 tablet (8 mg total) by mouth every 8 (eight) hours as needed for nausea or vomiting.  Dispense: 90 tablet; Refill: 3   Counseling provided to patient on importance of health maintenance and screenings for cancer such as mammograms and colonoscopies. Counseled on importance of lab monitoring. Counseled at length on tobacco and alcohol cessation for health benefits. She will follow up as needed.    Return for as needed.   Bethanie Dicker, NP-C [facility] [employer]

## 2023-07-02 ENCOUNTER — Encounter: Payer: Self-pay | Admitting: Nurse Practitioner

## 2023-07-02 NOTE — Assessment & Plan Note (Addendum)
Chronic. Stable on Crestor 5 mg daily. Continue. Refills sent. Declined lipid panel today. Encouraged healthy diet and exercise. The 10-year ASCVD risk score (Arnett DK, et al., 2019) is: 3.3%.

## 2023-07-02 NOTE — Assessment & Plan Note (Signed)
Chronic issue. Stable on Requip 4 mg - 1 tablet 5 times daily. Continue. Refills sent.

## 2023-07-02 NOTE — Assessment & Plan Note (Signed)
Chronic issue. Stable with Imitrex injection PRN. Continue. Refills sent. She does not have to use often. Advised to contact if worsening or changing migraines.

## 2023-07-03 ENCOUNTER — Other Ambulatory Visit: Payer: Medicaid Other

## 2023-07-06 ENCOUNTER — Telehealth: Payer: Self-pay | Admitting: Pharmacy Technician

## 2023-07-06 ENCOUNTER — Other Ambulatory Visit (HOSPITAL_COMMUNITY): Payer: Self-pay

## 2023-07-06 NOTE — Telephone Encounter (Signed)
Pharmacy Patient Advocate Encounter   Received notification from CoverMyMeds that prior authorization for SUMAtriptan Succinate 4MG /0.5ML auto-injectors is required/requested.   Insurance verification completed.   The patient is insured through [facility] .   Per test claim: PA required; PA submitted to [facility] via CoverMyMeds Key/confirmation #/EOC [id number] Status is pending

## 2023-07-11 ENCOUNTER — Other Ambulatory Visit: Payer: Self-pay | Admitting: Medical Genetics

## 2023-07-11 DIAGNOSIS — Z006 Encounter for examination for normal comparison and control in clinical research program: Secondary | ICD-10-CM

## 2023-07-23 ENCOUNTER — Ambulatory Visit: Payer: Medicaid Other | Admitting: Nurse Practitioner

## 2023-07-23 ENCOUNTER — Other Ambulatory Visit: Payer: Medicaid Other

## 2023-07-30 ENCOUNTER — Other Ambulatory Visit: Payer: Medicaid Other

## 2023-08-07 ENCOUNTER — Encounter: Payer: Self-pay | Admitting: Nurse Practitioner

## 2023-08-07 ENCOUNTER — Ambulatory Visit (INDEPENDENT_AMBULATORY_CARE_PROVIDER_SITE_OTHER): Payer: Medicaid Other | Admitting: Nurse Practitioner

## 2023-08-07 VITALS — BP 120/80 | HR 92 | Temp 98.7°F | Ht 67.0 in | Wt 198.0 lb

## 2023-08-07 DIAGNOSIS — M25531 Pain in right wrist: Secondary | ICD-10-CM | POA: Diagnosis not present

## 2023-08-07 NOTE — Progress Notes (Signed)
Bethanie Dicker, NP-C Phone: [phone]  Kim [name] is a 57 y.o. female who presents today for wrist pain.   Discussed the use of AI scribe software for clinical note transcription with the patient, who gave verbal consent to proceed.  History of Present Illness   The patient, with a history of carpal tunnel syndrome, presents with worsening right wrist pain for approximately two weeks. The pain is located on the outer aspect of the wrist and is exacerbated by twisting movements and side-to-side motion. The patient denies tenderness to touch but reports difficulty in making a fist, especially in the mornings. She also reports difficulty in performing daily activities such as brushing her hair due to the pain.  The patient underwent carpal tunnel release surgery in the early 2000s, which initially alleviated symptoms of numbness and tingling. However, she now experiences morning stiffness in both hands, which she attributes to age and possible arthritis.  The patient has been managing the pain with over-the-counter BC powders, but has not tried other interventions such as ice, heat, or topical analgesics. She has a wrist brace from previous treatment for carpal tunnel syndrome, but finds it difficult to wear during daily activities.  The patient also reports chronic back pain, for which she has previously seen a pain management specialist. She has tried various treatments, including injections, but found them ineffective. She declined further injections due to the anticipated pain and short duration of relief. She manages her back pain with heat pads and topical analgesics.  The patient lives in an RV and is currently involved in physically demanding activities such as building a deck, which may be contributing to her wrist pain. She expresses a preference for managing her own health issues and is hesitant to see an orthopedic specialist at this time.      Social History   Tobacco Use   Smoking Status Every Day   Current packs/day: 3.00   Average packs/day: 3.0 packs/day for 44.0 years (132.0 ttl pk-yrs)   Types: Cigarettes, Pipe, Cigars  Smokeless Tobacco Never  Tobacco Comments   3.5-4pdd 04/30/2021    Current Outpatient Medications on File Prior to Visit  Medication Sig Dispense Refill   Aspirin-Caffeine 845-65 MG PACK Take 6-7 each by mouth daily as needed (migraines). BC/ goodie powder 56 each    fluticasone (FLONASE) 50 MCG/ACT nasal spray shake liquid AND INSTILL 1 SPRAY IN EACH NOSTRIL DAILY 16 g 2   nitroGLYCERIN (NITROSTAT) 0.4 MG SL tablet Place 0.4 mg under the tongue every 5 (five) minutes as needed for chest pain.     ondansetron (ZOFRAN-ODT) 8 MG disintegrating tablet Take 1 tablet (8 mg total) by mouth every 8 (eight) hours as needed for nausea or vomiting. 90 tablet 3   rOPINIRole (REQUIP) 4 MG tablet Take 1 tablet (4 mg total) by mouth 5 (five) times daily. 450 tablet 3   rosuvastatin (CRESTOR) 5 MG tablet Take 1 tablet (5 mg total) by mouth daily. 90 tablet 3   SUMAtriptan Succinate 4 MG/0.5ML SOAJ Inject 4 mg into the skin daily as needed (migraine). 0.5 mL 5   albuterol (PROVENTIL) (2.5 MG/3ML) 0.083% nebulizer solution Take 2.5 mg by nebulization every 6 (six) hours as needed for wheezing or shortness of breath. (Patient not taking: Reported on 01/08/2023)     No current facility-administered medications on file prior to visit.    ROS see history of present illness  Objective  Physical Exam Vitals:   08/07/23 0806  BP: 120/80  Pulse: 92  Temp: 98.7 F (37.1 C)  SpO2: 95%    BP Readings from Last 3 Encounters:  08/07/23 120/80  06/23/23 118/80  01/12/23 112/74   Wt Readings from Last 3 Encounters:  08/07/23 198 lb (89.8 kg)  06/23/23 199 lb 3.2 oz (90.4 kg)  01/12/23 185 lb (83.9 kg)    Physical Exam Constitutional:      General: She is not in acute distress.    Appearance: Normal appearance.  HENT:     Head: Normocephalic.   Cardiovascular:     Rate and Rhythm: Normal rate and regular rhythm.     Heart sounds: Normal heart sounds.  Pulmonary:     Effort: Pulmonary effort is normal.     Breath sounds: Normal breath sounds.  Musculoskeletal:     Right wrist: Swelling (ulnar side) and tenderness (ulnar side) present. Decreased range of motion.     Left wrist: Normal.  Skin:    General: Skin is warm and dry.  Neurological:     General: No focal deficit present.     Mental Status: She is alert.  Psychiatric:        Mood and Affect: Mood normal.        Behavior: Behavior normal.    Assessment/Plan: Please see individual problem list.  Acute pain of right wrist Assessment & Plan: She exhibits pain and limited range of motion in her right wrist, especially during twisting and lateral movements, with a history of carpal tunnel release surgery in the early 2000s. No numbness or tingling is present, but swelling was noted on examination. We will order a wrist X-ray and advise wearing a wrist brace, particularly at night. Alternating ice and heat therapy is recommended, along with the application of topical analgesic, such as Tiger Balm. Further work up/referral pending results of X-ray.   Orders: -     DG Wrist Complete Right; Future    Return if symptoms worsen or fail to improve.   Bethanie Dicker, NP-C [facility] [employer]

## 2023-08-07 NOTE — Assessment & Plan Note (Signed)
She exhibits pain and limited range of motion in her right wrist, especially during twisting and lateral movements, with a history of carpal tunnel release surgery in the early 2000s. No numbness or tingling is present, but swelling was noted on examination. We will order a wrist X-ray and advise wearing a wrist brace, particularly at night. Alternating ice and heat therapy is recommended, along with the application of topical analgesic, such as Tiger Balm. Further work up/referral pending results of X-ray.

## 2023-08-10 ENCOUNTER — Other Ambulatory Visit: Payer: Self-pay | Admitting: Nurse Practitioner

## 2023-08-10 DIAGNOSIS — I209 Angina pectoris, unspecified: Secondary | ICD-10-CM

## 2023-08-10 DIAGNOSIS — J449 Chronic obstructive pulmonary disease, unspecified: Secondary | ICD-10-CM

## 2023-08-10 MED ORDER — NITROGLYCERIN 0.4 MG SL SUBL
0.4000 mg | SUBLINGUAL_TABLET | SUBLINGUAL | 2 refills | Status: DC | PRN
Start: 2023-08-10 — End: 2024-01-18
  Filled 2023-08-10: qty 25, 9d supply, fill #0

## 2023-08-10 MED ORDER — ALBUTEROL SULFATE (2.5 MG/3ML) 0.083% IN NEBU
2.5000 mg | INHALATION_SOLUTION | Freq: Four times a day (QID) | RESPIRATORY_TRACT | 0 refills | Status: DC | PRN
Start: 2023-08-10 — End: 2023-08-31
  Filled 2023-08-10: qty 75, 7d supply, fill #0

## 2023-08-11 ENCOUNTER — Other Ambulatory Visit: Payer: Self-pay

## 2023-08-11 ENCOUNTER — Other Ambulatory Visit: Payer: Medicaid Other

## 2023-08-11 ENCOUNTER — Encounter: Payer: Self-pay | Admitting: Pharmacist

## 2023-08-11 ENCOUNTER — Ambulatory Visit: Payer: Medicaid Other

## 2023-08-11 DIAGNOSIS — M25531 Pain in right wrist: Secondary | ICD-10-CM

## 2023-08-12 ENCOUNTER — Other Ambulatory Visit: Payer: Self-pay

## 2023-08-17 ENCOUNTER — Other Ambulatory Visit: Payer: Medicaid Other

## 2023-08-17 ENCOUNTER — Telehealth: Payer: Self-pay

## 2023-08-17 ENCOUNTER — Encounter: Payer: Self-pay | Admitting: Nurse Practitioner

## 2023-08-17 NOTE — Telephone Encounter (Signed)
Patient had scheduled an appointment with Bethanie Dicker, NP, on 08/20/2023 and we had the following messages from patient on MyChart.  "I just noticed an appointment on December 5, in witch I wasn't informed about. Why do I have this appointment?"  "On another issue, someone has changed my meds,,my Zofran was filled,,but the milligram was changed. I take Zofran-ODT (ondansetron ODT) 8 mg Tbdi. Not 4mg .   Attachments  [id number]"   Patient had scheduled the appointment on 08/20/2023 and she cancelled it.

## 2023-08-18 ENCOUNTER — Other Ambulatory Visit
Admission: RE | Admit: 2023-08-18 | Discharge: 2023-08-18 | Disposition: A | Discharge status: Home / Self Care | Payer: Medicaid Other | Source: Ambulatory Visit | Attending: Medical Genetics | Admitting: Medical Genetics

## 2023-08-18 ENCOUNTER — Encounter: Payer: Self-pay | Admitting: Nurse Practitioner

## 2023-08-18 ENCOUNTER — Other Ambulatory Visit: Payer: Self-pay | Admitting: Nurse Practitioner

## 2023-08-18 DIAGNOSIS — Z006 Encounter for examination for normal comparison and control in clinical research program: Secondary | ICD-10-CM | POA: Insufficient documentation

## 2023-08-18 DIAGNOSIS — M25531 Pain in right wrist: Secondary | ICD-10-CM

## 2023-08-18 NOTE — Telephone Encounter (Signed)
APPT WAS CANCELLED PT NEEDED REF TO ORTHO

## 2023-08-19 ENCOUNTER — Ambulatory Visit: Payer: Medicaid Other | Admitting: Nurse Practitioner

## 2023-08-20 ENCOUNTER — Ambulatory Visit: Payer: Medicaid Other | Admitting: Nurse Practitioner

## 2023-08-20 ENCOUNTER — Other Ambulatory Visit: Payer: Self-pay | Admitting: Nurse Practitioner

## 2023-08-20 ENCOUNTER — Encounter: Payer: Self-pay | Admitting: Nurse Practitioner

## 2023-08-20 DIAGNOSIS — M1990 Unspecified osteoarthritis, unspecified site: Secondary | ICD-10-CM

## 2023-08-20 DIAGNOSIS — M25831 Other specified joint disorders, right wrist: Secondary | ICD-10-CM | POA: Diagnosis not present

## 2023-08-24 ENCOUNTER — Encounter: Payer: Self-pay | Admitting: Nurse Practitioner

## 2023-08-24 ENCOUNTER — Telehealth: Payer: Self-pay

## 2023-08-24 NOTE — Telephone Encounter (Signed)
Pt needs a PA for SUMAtriptan Succinate 4 MG/0.5ML SOAJ   the injection

## 2023-08-28 NOTE — Telephone Encounter (Signed)
Pharmacy Patient Advocate Encounter  Received notification from [facility] that Prior Authorization for SUMAtriptan Succinate 4MG /0.5ML auto-injectors  has been DENIED.  Full denial letter will be uploaded to the media tab. See denial reason below. Per your health plan's criteria, this drug is covered if you meet the following: You have tried two preferred drugs: Rizatriptan orally disintegrating tablet, rizatriptan tablets, or sumatriptan nasal spray/tablet/vial. The information provided does not show that you meet the criteria listed above.  PA #/Case ID/Reference #: [id number]

## 2023-08-31 ENCOUNTER — Other Ambulatory Visit: Payer: Self-pay | Admitting: Nurse Practitioner

## 2023-08-31 ENCOUNTER — Encounter: Payer: Self-pay | Admitting: Nurse Practitioner

## 2023-08-31 ENCOUNTER — Ambulatory Visit (INDEPENDENT_AMBULATORY_CARE_PROVIDER_SITE_OTHER): Payer: Medicaid Other | Admitting: Nurse Practitioner

## 2023-08-31 VITALS — BP 110/78 | HR 77 | Temp 98.2°F | Ht 67.0 in | Wt 211.4 lb

## 2023-08-31 DIAGNOSIS — E782 Mixed hyperlipidemia: Secondary | ICD-10-CM

## 2023-08-31 DIAGNOSIS — I700 Atherosclerosis of aorta: Secondary | ICD-10-CM

## 2023-08-31 DIAGNOSIS — Z131 Encounter for screening for diabetes mellitus: Secondary | ICD-10-CM | POA: Diagnosis not present

## 2023-08-31 DIAGNOSIS — J432 Centrilobular emphysema: Secondary | ICD-10-CM | POA: Diagnosis not present

## 2023-08-31 DIAGNOSIS — G2581 Restless legs syndrome: Secondary | ICD-10-CM | POA: Diagnosis not present

## 2023-08-31 DIAGNOSIS — Z1329 Encounter for screening for other suspected endocrine disorder: Secondary | ICD-10-CM

## 2023-08-31 DIAGNOSIS — G43009 Migraine without aura, not intractable, without status migrainosus: Secondary | ICD-10-CM

## 2023-08-31 DIAGNOSIS — M1990 Unspecified osteoarthritis, unspecified site: Secondary | ICD-10-CM | POA: Diagnosis not present

## 2023-08-31 DIAGNOSIS — M79672 Pain in left foot: Secondary | ICD-10-CM | POA: Insufficient documentation

## 2023-08-31 LAB — COMPREHENSIVE METABOLIC PANEL
ALT: 16 U/L (ref 0–35)
AST: 17 U/L (ref 0–37)
Albumin: 4 g/dL (ref 3.5–5.2)
Alkaline Phosphatase: 109 U/L (ref 39–117)
BUN: 36 mg/dL — ABNORMAL HIGH (ref 6–23)
CO2: 25 meq/L (ref 19–32)
Calcium: 9 mg/dL (ref 8.4–10.5)
Chloride: 107 meq/L (ref 96–112)
Creatinine, Ser: 0.75 mg/dL (ref 0.40–1.20)
GFR: 88.59 mL/min (ref >60.00)
Glucose, Bld: 103 mg/dL — ABNORMAL HIGH (ref 70–99)
Potassium: 5.1 meq/L (ref 3.5–5.1)
Sodium: 139 meq/L (ref 135–145)
Total Bilirubin: 0.3 mg/dL (ref 0.2–1.2)
Total Protein: 6.7 g/dL (ref 6.0–8.3)

## 2023-08-31 LAB — GENECONNECT MOLECULAR SCREEN: Genetic Analysis Overall Interpretation: NEGATIVE

## 2023-08-31 LAB — LIPID PANEL
Cholesterol: 186 mg/dL (ref 0–200)
HDL: 71.1 mg/dL (ref >39.00)
LDL Cholesterol: 95 mg/dL (ref 0–99)
NonHDL: 115.27
Total CHOL/HDL Ratio: 3
Triglycerides: 99 mg/dL (ref 0.0–149.0)
VLDL: 19.8 mg/dL (ref 0.0–40.0)

## 2023-08-31 LAB — CBC WITH DIFFERENTIAL/PLATELET
Basophils Absolute: 0 10*3/uL (ref 0.0–0.1)
Basophils Relative: 0.7 % (ref 0.0–3.0)
Eosinophils Absolute: 0.1 10*3/uL (ref 0.0–0.7)
Eosinophils Relative: 1.3 % (ref 0.0–5.0)
HCT: 41.5 % (ref 36.0–46.0)
Hemoglobin: 13.7 g/dL (ref 12.0–15.0)
Lymphocytes Relative: 22.7 % (ref 12.0–46.0)
Lymphs Abs: 1.6 10*3/uL (ref 0.7–4.0)
MCHC: 33.1 g/dL (ref 30.0–36.0)
MCV: 98.5 fL (ref 78.0–100.0)
Monocytes Absolute: 0.4 10*3/uL (ref 0.1–1.0)
Monocytes Relative: 6 % (ref 3.0–12.0)
Neutro Abs: 4.8 10*3/uL (ref 1.4–7.7)
Neutrophils Relative %: 69.3 % (ref 43.0–77.0)
Platelets: 266 10*3/uL (ref 150.0–400.0)
RBC: 4.22 Mil/uL (ref 3.87–5.11)
RDW: 14.2 % (ref 11.5–15.5)
WBC: 6.9 10*3/uL (ref 4.0–10.5)

## 2023-08-31 LAB — SEDIMENTATION RATE: Sed Rate: 18 mm/h (ref 0–30)

## 2023-08-31 LAB — C-REACTIVE PROTEIN: CRP: <1 mg/dL (ref 0.5–20.0)

## 2023-08-31 LAB — TSH: TSH: 3.42 u[IU]/mL (ref 0.35–5.50)

## 2023-08-31 LAB — HEMOGLOBIN A1C: Hgb A1c MFr Bld: 6.1 % (ref 4.6–6.5)

## 2023-08-31 MED ORDER — ALBUTEROL SULFATE (2.5 MG/3ML) 0.083% IN NEBU
2.5000 mg | INHALATION_SOLUTION | Freq: Four times a day (QID) | RESPIRATORY_TRACT | 2 refills | Status: DC | PRN
Start: 2023-08-31 — End: 2023-12-25

## 2023-08-31 MED ORDER — PREGABALIN 75 MG PO CAPS
75.0000 mg | ORAL_CAPSULE | Freq: Two times a day (BID) | ORAL | 5 refills | Status: DC
Start: 2023-08-31 — End: 2023-11-06

## 2023-08-31 MED ORDER — SUMATRIPTAN 5 MG/ACT NA SOLN: NASAL | 5 refills | Status: DC

## 2023-08-31 NOTE — Assessment & Plan Note (Signed)
She uses Albuterol via nebulizer and does not use other inhalers due to dizziness side effects. We will ensure the Albuterol prescription is sent to the correct pharmacy.

## 2023-08-31 NOTE — Progress Notes (Signed)
Bethanie Dicker, NP-C Phone: [phone]  Kim [name] is a 57 y.o. female who presents today for left foot pain.   Discussed the use of AI scribe software for clinical note transcription with the patient, who gave verbal consent to proceed.  History of Present Illness   The patient presents with a persistent, painful callus like lesion on the ball of her left foot, described as "like a corn," which has been present for over a year. The patient has been self-managing by cutting it out due to difficulty walking, but reports that the lesion has been growing larger and is now causing discomfort even at rest. She does note that it feels like she is walking on something or there is something in her shoe/foot. The patient has tried filing the lesion, but reports that this method is ineffective in relieving the pain. No attempts at padding or other forms of relief have been made.  The patient also reports issues with her current medication regimen. She has been prescribed a nebulizer medication, which she has been unable to refill at her preferred pharmacy. She also reports that her current dose of Lyrica (50mg  twice daily) for restless leg syndrome is insufficient, as she still experiences significant pain. The patient has been self-adjusting the dose, taking two to three pills as needed for pain relief. She ran out of her medication two days prior to the consultation.  The patient also reports intermittent swelling in her hands and feet, which she describes as significant enough to prevent her from wearing socks. She denies any current edema at the time of the consultation. She also reports occasional use of migraine shots, which she takes approximately once every six months when experiencing a severe migraine.      Social History   Tobacco Use  Smoking Status Every Day   Current packs/day: 3.00   Average packs/day: 3.0 packs/day for 44.0 years (132.0 ttl pk-yrs)   Types: Cigarettes, Pipe,  Cigars  Smokeless Tobacco Never  Tobacco Comments   3.5-4pdd 04/30/2021    Current Outpatient Medications on File Prior to Visit  Medication Sig Dispense Refill   Aspirin-Caffeine 845-65 MG PACK Take 6-7 each by mouth daily as needed (migraines). BC/ goodie powder 56 each    fluticasone (FLONASE) 50 MCG/ACT nasal spray shake liquid AND INSTILL 1 SPRAY IN EACH NOSTRIL DAILY 16 g 2   nitroGLYCERIN (NITROSTAT) 0.4 MG SL tablet Place 1 tablet (0.4 mg total) under the tongue every 5 (five) minutes as needed for chest pain. Max of 3 doses within 15 minutes. If chest pain continues seek immediate medical attention. 25 tablet 2   ondansetron (ZOFRAN-ODT) 8 MG disintegrating tablet Take 1 tablet (8 mg total) by mouth every 8 (eight) hours as needed for nausea or vomiting. 90 tablet 3   rOPINIRole (REQUIP) 4 MG tablet Take 1 tablet (4 mg total) by mouth 5 (five) times daily. 450 tablet 3   rosuvastatin (CRESTOR) 5 MG tablet Take 1 tablet (5 mg total) by mouth daily. 90 tablet 3   No current facility-administered medications on file prior to visit.    ROS see history of present illness  Objective  Physical Exam Vitals:   08/31/23 0809  BP: 110/78  Pulse: 77  Temp: 98.2 F (36.8 C)  SpO2: 98%    BP Readings from Last 3 Encounters:  08/31/23 110/78  08/07/23 120/80  06/23/23 118/80   Wt Readings from Last 3 Encounters:  08/31/23 211 lb 6.4 oz (95.9 kg)  08/07/23 198 lb (89.8 kg)  06/23/23 199 lb 3.2 oz (90.4 kg)    Physical Exam Constitutional:      General: She is not in acute distress.    Appearance: Normal appearance.  HENT:     Head: Normocephalic.  Cardiovascular:     Rate and Rhythm: Normal rate and regular rhythm.     Heart sounds: Normal heart sounds.  Pulmonary:     Effort: Pulmonary effort is normal.     Breath sounds: Normal breath sounds.  Musculoskeletal:     Left foot: Normal range of motion.       Feet:  Feet:     Left foot:     Skin integrity: Callus  present. No ulcer, blister, erythema or warmth.     Comments: Small corn like callus noted on ball of left foot. Tenderness present.  Skin:    General: Skin is warm and dry.  Neurological:     General: No focal deficit present.     Mental Status: She is alert.  Psychiatric:        Mood and Affect: Mood normal.        Behavior: Behavior normal.    Assessment/Plan: Please see individual problem list.  Left foot pain Assessment & Plan: A painful, hard lesion on the ball of her left foot, possibly a corn or neuroma, has led her to self-manage by cutting it out. We will refer her to podiatry for further evaluation and treatment.  Orders: -     Ambulatory referral to Podiatry  RLS (restless legs syndrome) Assessment & Plan: Chronic issue. Currently on Requip 4 mg - 1 tablet 5 times daily and Lyrica 50 mg BID. She reports taking more than the prescribed dose of Lyrica due to pain and increased symptoms. We will increase Lyrica to 75mg  twice daily and she will continue Requip as prescribed.   Orders: -     Pregabalin; Take 1 capsule (75 mg total) by mouth 2 (two) times daily.  Dispense: 60 capsule; Refill: 5  Centrilobular emphysema (HCC) Assessment & Plan: She uses Albuterol via nebulizer and does not use other inhalers due to dizziness side effects. We will ensure the Albuterol prescription is sent to the correct pharmacy.   Orders: -     Albuterol Sulfate; Take 3 mLs (2.5 mg total) by nebulization every 6 (six) hours as needed for wheezing or shortness of breath.  Dispense: 75 mL; Refill: 2  Arthritis Assessment & Plan: Recent evaluation by Ortho who recommended lab work for further evaluation. We will check labs as outlined.   Orders: -     ANA,IFA RA Diag Pnl w/rflx Tit/Patn -     C-reactive protein -     Sedimentation rate  Mixed dyslipidemia Assessment & Plan: Chronic. Stable on Crestor 5 mg daily. Continue. Encouraged healthy diet and exercise. The 10-year ASCVD risk  score (Arnett DK, et al., 2019) is: 3.1%. We will check lipid panel today.   Orders: -     Lipid panel  Aortic atherosclerosis (HCC) -     CBC with Differential/Platelet -     Comprehensive metabolic panel  Thyroid disorder screen -     TSH  Diabetes mellitus screening -     Hemoglobin A1c   Return in about 6 months (around 02/29/2024), or if symptoms worsen or fail to improve, for Follow up.   Bethanie Dicker, NP-C [facility] - [employer]

## 2023-08-31 NOTE — Assessment & Plan Note (Signed)
A painful, hard lesion on the ball of her left foot, possibly a corn or neuroma, has led her to self-manage by cutting it out. We will refer her to podiatry for further evaluation and treatment.

## 2023-08-31 NOTE — Assessment & Plan Note (Signed)
Chronic. Stable on Crestor 5 mg daily. Continue. Encouraged healthy diet and exercise. The 10-year ASCVD risk score (Arnett DK, et al., 2019) is: 3.1%. We will check lipid panel today.

## 2023-08-31 NOTE — Assessment & Plan Note (Signed)
Chronic issue. Currently on Requip 4 mg - 1 tablet 5 times daily and Lyrica 50 mg BID. She reports taking more than the prescribed dose of Lyrica due to pain and increased symptoms. We will increase Lyrica to 75mg  twice daily and she will continue Requip as prescribed.

## 2023-08-31 NOTE — Assessment & Plan Note (Signed)
Recent evaluation by Ortho who recommended lab work for further evaluation. We will check labs as outlined.

## 2023-09-01 ENCOUNTER — Encounter: Payer: Self-pay | Admitting: Nurse Practitioner

## 2023-09-02 ENCOUNTER — Other Ambulatory Visit: Payer: Self-pay | Admitting: Nurse Practitioner

## 2023-09-02 DIAGNOSIS — J432 Centrilobular emphysema: Secondary | ICD-10-CM

## 2023-09-02 DIAGNOSIS — G8929 Other chronic pain: Secondary | ICD-10-CM

## 2023-09-02 LAB — ANA,IFA RA DIAG PNL W/RFLX TIT/PATN
Anti Nuclear Antibody (ANA): NEGATIVE
Cyclic Citrullin Peptide Ab: <16 U
Rheumatoid fact SerPl-aCnc: <10 [IU]/mL (ref <14)

## 2023-09-03 ENCOUNTER — Other Ambulatory Visit: Payer: Self-pay | Admitting: Nurse Practitioner

## 2023-09-03 DIAGNOSIS — J432 Centrilobular emphysema: Secondary | ICD-10-CM | POA: Diagnosis not present

## 2023-09-03 DIAGNOSIS — R799 Abnormal finding of blood chemistry, unspecified: Secondary | ICD-10-CM

## 2023-09-04 ENCOUNTER — Other Ambulatory Visit (HOSPITAL_COMMUNITY): Payer: Self-pay

## 2023-09-04 NOTE — Telephone Encounter (Addendum)
Injection previously denied (telephone encounter on 07/06/23) and changed to nasal spray. Per test claim, nasal spray was filled 08/31/23.

## 2023-09-07 ENCOUNTER — Telehealth: Payer: Self-pay

## 2023-09-07 NOTE — Telephone Encounter (Signed)
[facility] faxed over a Confirmation of Order sheet that needs providers Most recent clinical notes and signature. Form has been placed in provider to be signed folder

## 2023-09-10 NOTE — Telephone Encounter (Signed)
Forms faxed to given fax number [phone] with a completed transmission log

## 2023-09-11 ENCOUNTER — Ambulatory Visit: Payer: Medicaid Other | Admitting: Nurse Practitioner

## 2023-09-21 ENCOUNTER — Encounter: Payer: Self-pay | Admitting: Nurse Practitioner

## 2023-09-22 ENCOUNTER — Other Ambulatory Visit: Payer: Medicaid Other

## 2023-09-22 DIAGNOSIS — R799 Abnormal finding of blood chemistry, unspecified: Secondary | ICD-10-CM

## 2023-09-28 ENCOUNTER — Other Ambulatory Visit (INDEPENDENT_AMBULATORY_CARE_PROVIDER_SITE_OTHER): Payer: Medicaid Other

## 2023-09-28 DIAGNOSIS — R799 Abnormal finding of blood chemistry, unspecified: Secondary | ICD-10-CM

## 2023-09-28 LAB — RENAL FUNCTION PANEL
Albumin: 4.4 g/dL (ref 3.5–5.2)
BUN: 28 mg/dL — ABNORMAL HIGH (ref 6–23)
CO2: 27 meq/L (ref 19–32)
Calcium: 9.2 mg/dL (ref 8.4–10.5)
Chloride: 104 meq/L (ref 96–112)
Creatinine, Ser: 0.84 mg/dL (ref 0.40–1.20)
GFR: 77.29 mL/min (ref >60.00)
Glucose, Bld: 97 mg/dL (ref 70–99)
Phosphorus: 3.5 mg/dL (ref 2.3–4.6)
Potassium: 3.8 meq/L (ref 3.5–5.1)
Sodium: 139 meq/L (ref 135–145)

## 2023-10-02 ENCOUNTER — Encounter: Payer: Self-pay | Admitting: Nurse Practitioner

## 2023-10-12 ENCOUNTER — Encounter: Payer: Self-pay | Admitting: Nurse Practitioner

## 2023-10-13 ENCOUNTER — Other Ambulatory Visit: Payer: Self-pay | Admitting: Nurse Practitioner

## 2023-10-13 DIAGNOSIS — J432 Centrilobular emphysema: Secondary | ICD-10-CM

## 2023-10-13 DIAGNOSIS — R918 Other nonspecific abnormal finding of lung field: Secondary | ICD-10-CM

## 2023-10-15 ENCOUNTER — Ambulatory Visit: Payer: Medicaid Other | Admitting: Podiatry

## 2023-10-15 ENCOUNTER — Encounter: Payer: Self-pay | Admitting: Podiatry

## 2023-10-15 VITALS — Ht 67.0 in | Wt 212.0 lb

## 2023-10-15 DIAGNOSIS — D492 Neoplasm of unspecified behavior of bone, soft tissue, and skin: Secondary | ICD-10-CM | POA: Diagnosis not present

## 2023-10-15 NOTE — Progress Notes (Signed)
Subjective:  Patient ID: Kim [name], female    DOB: May 13, 1966,  MRN: [id number]  Chief Complaint  Patient presents with   Callouses    RM6: np callous the bottom of left foot     58 y.o. female presents with the above complaint.  Patient presents with left submetatarsal 3 skin neoplasm hurts with ambulation worse with pressure she wanted to get it evaluated she has not seen and was prior to seeing me pain scale 7 out of 10 dull aching nature she would like to have it removed.   Review of Systems: Negative except as noted in the HPI. Denies N/V/F/Ch.  Past Medical History:  Diagnosis Date   Abnormal drug screen (01/08/2022 UDS) 02/26/2022   (01/08/2022) abnormal UDS (+) for carboxy-THC (marijuana)    Abnormal MRI, cervical spine (01/23/2022) 02/26/2022   (01/23/2022) MRI CERVICAL SPINE FINDINGS:  Alignment: Mild reversal the normal cervical lordosis.     DISC LEVELS:  C3-4: Mild bilateral facet arthropathy  C4-5: Minimal disc bulge and mild bilateral facet arthropathy     IMPRESSION:  1. Mild multilevel degenerative change without significant canal or foraminal stenosis.   Abnormal MRI, lumbar spine (10/28/2021) 12/23/2021   (10/28/2021) LUMBAR MRI FINDINGS:  Alignment: Is trace retrolisthesis of L2 on L3 and L3 on L4, not significantly changed. Alignment is otherwise normal.  Vertebrae: There is anterior compression deformity of the T12 vertebral body with up to approximately 25% loss of vertebral body height anteriorly, not significantly changed since 2021. The other vertebral body heights are preserved. There is mi   Abnormal MRI, thoracic spine (01/23/2022) 02/26/2022   (01/23/2022) MRI THORACIC SPINE FINDINGS:  Alignment: Mildly exaggerated thoracic kyphosis at T11-T12 due to T12 fracture described below.  Vertebrae: Remote T12 compression fracture, unchanged from 10/28/2021 MRI      DISC LEVELS:  Mild disc bulge at T11-12      IMPRESSION:  1. Mild multilevel degenerative change  without significant canal or foraminal stenosis.  2. Remote T12 fracture with similar    Allergy    Angina pectoris (HCC) 01/07/2023   Aortic atherosclerosis (HCC) 01/14/2021   Arthritis    Asthma    Atherosclerotic heart disease of native coronary artery without angina pectoris    Bilateral stenosis of lateral recess of lumbar spine    Bipolar disorder (HCC)    Carpal tunnel syndrome (Bilateral) 01/08/2022   Centrilobular emphysema (HCC) 02/21/2016   Formatting of this note might be different from the original.  Last Assessment & Plan:   Formatting of this note might be different from the original.  Ongoing. Not well controlled.  Saw Pulmonology whom she felt didn't help her cough.  Requesting new referral to see a different Pulmonologist.  Referral was placed today during the visit.   Cervical facet syndrome 01/08/2022   Cervical radiculopathy 08/01/2020   Cervicalgia 01/08/2022   Cervicogenic headache 01/08/2022   Chronic cough 03/16/2022   Chronic hip pain (2ry area of Pain) (Bilateral) (L>R) 01/08/2022   Chronic low back pain (1ry area of Pain) (Bilateral) (L>R) w/o sciatica 07/04/2021   Chronic lower extremity pain (3ry area of Pain) (Intermittent) (Left) 01/08/2022   Chronic neck and back pain (5th area of Pain) (Midline) 01/08/2022   Chronic obstructive pulmonary disease (HCC) 06/25/2011   Active smoker  - 04/30/2021  After extensive coaching inhaler device,  effectiveness =    90% with smi > resume stiolto     Formatting of this note might be different from the  original.  Cherlyn Roberts of this note might be different from the original.  Active smoker  - 04/30/2021  After extensive coaching inhaler device,  effectiveness =    90% with smi > resume stiolto     Last Assessment & Plan:   Fo   Chronic pain syndrome 12/23/2021   Chronic sacroiliac joint pain (Bilateral) 02/26/2022   Chronic shoulder pain (6th area of Pain) (Bilateral) 01/08/2022   Chronic thoracic back pain (4th area of Pain)  (Midline) (Bilateral) 01/08/2022   Cigarette smoker 05/01/2021   DDD (degenerative disc disease), cervical 12/23/2021   DDD (degenerative disc disease), lumbosacral 12/23/2021   (10/28/2021) LUMBAR MRI FINDINGS:  LEVELS:  There is disc desiccation without significant loss of height at L3-L4 and L4-L5. There is disc desiccation and mild narrowing at L1-L2, L2-L3, and L5-S1.  L1-2: central protrusion  L2-3: disc bulge  L3-4: disc bulge with a left foraminal component and annular fissure  L4-5: diffuse disc bulge with a central annular fissure  L5-S1: diffuse disc bulge with   DDD (degenerative disc disease), thoracic 12/23/2021   Depression    Disorder of skeletal system 12/23/2021   Elevated alkaline phosphatase level 02/26/2022   (01/08/2022)- Normal ALP (Alkaline phosphatase) levels are between 44 -121 IU/L, for our Lab. High ALP could suggest liver damage or increased bone cell activity. If other tests such as bilirubin, aspartate aminotransferase (AST), or alanine aminotransferase (ALT) are also high, usually the increased ALP is coming from the liver. Higher-than-normal ALP levels can be seen with: biliary obstruction;    Emphysema of lung (HCC)    Failure to attend appointment 10/20/2022   GERD (gastroesophageal reflux disease)    Grade 1 Retrolisthesis of cervical region (2 mm) (C4/C5) 12/23/2021   Grade 1 Retrolisthesis of L2/L3 and L3/L4 01/08/2022   (10/28/2021) LUMBAR MRI FINDINGS:  Alignment: Is trace retrolisthesis of L2 on L3 and L3 on L4.  LEVELS:  L2-3: There is a mild disc bulge and mild facet arthropathy  L3-4: There is a mild disc bulge with a left foraminal component and annular fissure and mild left worse than right facet arthropathy resulting in narrowing of the left subarticular zone with crowding of the traversing nerve root, an   Headache disorder 09/20/2020   History of ileus 02/19/2021   History of marijuana use 02/26/2022   (01/08/2022) abnormal UDS (+) for carboxy-THC  (marijuana)   The electronic medical record also indicated positive results on 10/09/2012 and 11/25/2013.   Leg pain    bilateral   Lumbar disc annular tears 01/08/2022   (10/28/2021) LUMBAR MRI FINDINGS:  LEVELS:  L3-4: There is a mild disc bulge with a left foraminal component and annular fissure  L4-5: There is a diffuse disc bulge with a central annular fissure   Lumbar facet syndrome (Bilateral) 01/08/2022   Lumbar lateral recess stenosis (L3-4) (Left) 12/23/2021   Lumbar radiculopathy 08/01/2020   Lumbosacral facet arthropathy (Multilevel) (Bilateral) 12/23/2021   (10/28/2021) LUMBAR MRI FINDINGS:  LEVELS:  L2-3: mild facet arthropathy  L3-4: mild left worse than right facet arthropathy.  L4-5: bilateral facet arthropathy  L5-S1: bilateral facet arthropathy. There are trace bilateral facet joint effusions with mild perifacetal soft tissue edema at this level which could reflect a source of pain.     There is facet arthropathy most advanced at L4-L5 and L5-S   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) (Bilateral: L5-S1) 12/23/2021   (10/28/2021) LUMBAR MRI FINDINGS:  LEVELS:  L3-4: mild disc bulge with a left foraminal  component resulting in mild left and no significant right neural foraminal stenosis. There is possible contact of the exiting left L3 nerve root by foraminal disc material.  L5-S1: diffuse disc bulge with bilateral foraminal components resulting in mild-to-moderate bilateral neural foraminal stenosis.   Lumbosacral lateral recess stenosis (L3-4, L5-S1) (Left) 01/08/2022   (10/28/2021) LUMBAR MRI FINDINGS:  LEVELS:  L3-4: narrowing of the left subarticular zone. There is possible contact of the exiting left L3 nerve root by foraminal disc material.  L5-S1: mild crowding of the left subarticular zone   Marijuana use 02/26/2022   (01/08/2022) abnormal UDS (+) for carboxy-THC (marijuana)    Migraines 07/02/2015   Mixed dyslipidemia 01/07/2023   Multiple pulmonary nodules determined  by computed tomography of lung 05/01/2021   Active smoker  03/07/21 (vs 12/04/20) new subpleural  area of airspace consolidation within the anterior basal right upper  lobe abutting the major fissure. This area has a mean derived  - CT 07/17/2021 lesions have either resolved or now change c/w inflammatory etiology plus emphysema  > repeat 18 mplaced  in reminder file      Formatting of this note might be different from the original.  Formattin   Nicotine dependence, cigarettes, w unsp disorders 06/25/2011   Numbness and tingling in hands (Bilateral) 01/08/2022   Occipital headache (Bilateral) 01/08/2022   Other specified chronic obstructive pulmonary disease (HCC) 06/25/2011   Other spondylosis, cervical region (C4/C5 and C5/C6) 12/23/2021   Pharmacologic therapy 12/23/2021   Problems influencing health status 12/23/2021   PTSD (post-traumatic stress disorder) 04/05/2020   Rectal bleeding 02/19/2021   RLS (restless legs syndrome) 07/02/2015   Spondylosis without myelopathy or radiculopathy, lumbosacral region 02/26/2022   Stress incontinence 11/01/2021   T12 compression fracture, sequela 08/01/2020   Thoracic facet syndrome 01/08/2022   Upper airway cough syndrome 05/01/2021   Onset 2021   - cyclical cough rx  04/30/2021 >>>        Current Outpatient Medications:    albuterol (PROVENTIL) (2.5 MG/3ML) 0.083% nebulizer solution, Take 3 mLs (2.5 mg total) by nebulization every 6 (six) hours as needed for wheezing or shortness of breath., Disp: 75 mL, Rfl: 2   Aspirin-Caffeine 845-65 MG PACK, Take 6-7 each by mouth daily as needed (migraines). BC/ goodie powder, Disp: 56 each, Rfl:    fluticasone (FLONASE) 50 MCG/ACT nasal spray, shake liquid AND INSTILL 1 SPRAY IN EACH NOSTRIL DAILY, Disp: 16 g, Rfl: 2   nitroGLYCERIN (NITROSTAT) 0.4 MG SL tablet, Place 1 tablet (0.4 mg total) under the tongue every 5 (five) minutes as needed for chest pain. Max of 3 doses within 15 minutes. If chest pain continues  seek immediate medical attention., Disp: 25 tablet, Rfl: 2   ondansetron (ZOFRAN-ODT) 8 MG disintegrating tablet, Take 1 tablet (8 mg total) by mouth every 8 (eight) hours as needed for nausea or vomiting., Disp: 90 tablet, Rfl: 3   pregabalin (LYRICA) 75 MG capsule, Take 1 capsule (75 mg total) by mouth 2 (two) times daily., Disp: 60 capsule, Rfl: 5   rOPINIRole (REQUIP) 4 MG tablet, Take 1 tablet (4 mg total) by mouth 5 (five) times daily., Disp: 450 tablet, Rfl: 3   rosuvastatin (CRESTOR) 5 MG tablet, Take 1 tablet (5 mg total) by mouth daily., Disp: 90 tablet, Rfl: 3   SUMAtriptan (IMITREX) 5 MG/ACT nasal spray, Place one spray intranasally at onset of migraine. May repeat dose x 1 after 2 hours if headache persists. Max 40 mg/24 hours., Disp: 1 each,  Rfl: 5  Social History   Tobacco Use  Smoking Status Every Day   Current packs/day: 3.00   Average packs/day: 3.0 packs/day for 44.0 years (132.0 ttl pk-yrs)   Types: Cigarettes, Pipe, Cigars  Smokeless Tobacco Never  Tobacco Comments   3.5-4pdd 04/30/2021    Allergies  Allergen Reactions   Penicillins Other (See Comments)    Unknown childhood reaction   Sulfa Antibiotics Swelling   Objective:  There were no vitals filed for this visit. Body mass index is 33.2 kg/m. Constitutional Well developed. Well nourished.  Vascular Dorsalis pedis pulses palpable bilaterally. Posterior tibial pulses palpable bilaterally. Capillary refill normal to all digits.  No cyanosis or clubbing noted. Pedal hair growth normal.  Neurologic Normal speech. Oriented to person, place, and time. Epicritic sensation to light touch grossly present bilaterally.  Dermatologic Left submetatarsal 3 skin neoplasm pain on palpation central nucleated core noted.  Orthopedic: Normal joint ROM without pain or crepitus bilaterally. No visible deformities. No bony tenderness.   Radiographs: None Assessment:   1. Skin neoplasm    Plan:  Patient was evaluated  and treated and all questions answered.  Left submetatarsal 3 skin neoplasm -All questions and concerns were discussed with the patient in extensive detail neck -Given the amount of pain that she is unsure benefit from debridement of the lesion using chisel blade handle the lesion was debrided down to healthy dry tissue.  No complication noted no pinpoint bleeding noted she states understanding -Shoe gear modification discussed  No follow-ups on file.

## 2023-10-22 ENCOUNTER — Telehealth: Payer: Self-pay

## 2023-10-22 ENCOUNTER — Ambulatory Visit: Payer: Medicaid Other

## 2023-10-22 ENCOUNTER — Encounter: Payer: Self-pay | Admitting: Nurse Practitioner

## 2023-10-22 ENCOUNTER — Ambulatory Visit: Payer: Medicaid Other | Admitting: Nurse Practitioner

## 2023-10-22 VITALS — BP 120/64 | HR 65 | Temp 97.6°F | Ht 67.0 in | Wt 201.0 lb

## 2023-10-22 DIAGNOSIS — R1013 Epigastric pain: Secondary | ICD-10-CM | POA: Diagnosis not present

## 2023-10-22 DIAGNOSIS — R195 Other fecal abnormalities: Secondary | ICD-10-CM | POA: Insufficient documentation

## 2023-10-22 DIAGNOSIS — R110 Nausea: Secondary | ICD-10-CM | POA: Insufficient documentation

## 2023-10-22 LAB — COMPREHENSIVE METABOLIC PANEL
ALT: 16 U/L (ref 0–35)
AST: 21 U/L (ref 0–37)
Albumin: 4.3 g/dL (ref 3.5–5.2)
Alkaline Phosphatase: 108 U/L (ref 39–117)
BUN: 14 mg/dL (ref 6–23)
CO2: 25 meq/L (ref 19–32)
Calcium: 9.3 mg/dL (ref 8.4–10.5)
Chloride: 104 meq/L (ref 96–112)
Creatinine, Ser: 0.85 mg/dL (ref 0.40–1.20)
GFR: 76.16 mL/min (ref >60.00)
Glucose, Bld: 93 mg/dL (ref 70–99)
Potassium: 4.2 meq/L (ref 3.5–5.1)
Sodium: 138 meq/L (ref 135–145)
Total Bilirubin: 0.4 mg/dL (ref 0.2–1.2)
Total Protein: 7 g/dL (ref 6.0–8.3)

## 2023-10-22 LAB — CBC WITH DIFFERENTIAL/PLATELET
Basophils Absolute: 0 10*3/uL (ref 0.0–0.1)
Basophils Relative: 0.4 % (ref 0.0–3.0)
Eosinophils Absolute: 0.2 10*3/uL (ref 0.0–0.7)
Eosinophils Relative: 2.7 % (ref 0.0–5.0)
HCT: 42.2 % (ref 36.0–46.0)
Hemoglobin: 14.4 g/dL (ref 12.0–15.0)
Lymphocytes Relative: 26.8 % (ref 12.0–46.0)
Lymphs Abs: 1.7 10*3/uL (ref 0.7–4.0)
MCHC: 34 g/dL (ref 30.0–36.0)
MCV: 95.3 fL (ref 78.0–100.0)
Monocytes Absolute: 0.5 10*3/uL (ref 0.1–1.0)
Monocytes Relative: 7.7 % (ref 3.0–12.0)
Neutro Abs: 4.1 10*3/uL (ref 1.4–7.7)
Neutrophils Relative %: 62.4 % (ref 43.0–77.0)
Platelets: 234 10*3/uL (ref 150.0–400.0)
RBC: 4.43 Mil/uL (ref 3.87–5.11)
RDW: 13.5 % (ref 11.5–15.5)
WBC: 6.5 10*3/uL (ref 4.0–10.5)

## 2023-10-22 NOTE — Assessment & Plan Note (Addendum)
 There are reports of black stools, changes in stool consistency, and chronic nausea, with no bright red blood noted. Order blood work to check for anemia and other abnormalities. Consider referral to GI for upper endoscopy/colonoscopy. STAT CT Abd/Pelvis ordered today. Further work up pending results.

## 2023-10-22 NOTE — Assessment & Plan Note (Signed)
 Epigastric pain is present with a history of diverticulitis and colostomies. The pain is not currently severe but has been in the past, with no current signs of diverticulitis. Unintentional weight loss and dark stools are noted. Order a STAT CT scan of the abdomen/pelvis for further evaluation. Further work up pending results.

## 2023-10-22 NOTE — Telephone Encounter (Signed)
 Copied from CRM [id number]. Topic: General - Other >> Oct 22, 2023 10:24 AM [place] J wrote: Reason for CRM: Patient advised that she will no longer be waiting for her appointment for imaging today due to long wait time.

## 2023-10-22 NOTE — Assessment & Plan Note (Signed)
 This is a longstanding issue, exacerbated by lying down, leading to coughing and vomiting. Denies GERD symptoms. Consider evaluation for gastroesophageal reflux disease (GERD) and potential treatment options if symptoms persist. Continue Zofran  as needed.

## 2023-10-22 NOTE — Progress Notes (Addendum)
 Leron Glance, NP-C Phone: [phone]  Kim [name] is a 58 y.o. female who presents today for abdominal pain.   Discussed the use of AI scribe software for clinical note transcription with the patient, who gave verbal consent to proceed.  History of Present Illness   Kim [name] is a 58 year old female with a history of diverticulitis who presents with abdominal pain for one week.  She experiences abdominal pain that was initially severe, causing her to scream daily, but has now become dull. The pain is centrally located across her abdomen along a surgical scar and is not localized to the right or left side. She has a history of diverticulitis and ileus and has undergone two colostomies, which were reversed in 2015.  She experiences nausea and vomiting, particularly when lying down, which leads to coughing and subsequent vomiting. This has been a chronic issue, and she vomits daily. She has been taking Zofran  for nausea. She also reports black stools, which vary in consistency from whole to watery, and notes that her stool has not been normal since her colostomy reversal. No bright red blood in her stool.  She has made dietary changes recently, avoiding nuts, apple skins, and strawberries due to concerns about diverticulitis. She mentions a weight change from 212 to 201 pounds in the last week, despite no significant changes in her diet or alcohol consumption. She drinks beer regularly and attributes her weight gain to this habit.  Her past medical history includes chronic gastrointestinal issues, including diverticulitis and colostomies. She had a colonoscopy in [place]  in 2018 or 2019, during which benign polyps were removed. She does not typically experience fevers, even during significant medical events like appendicitis.      Social History   Tobacco Use  Smoking Status Every Day   Current packs/day: 3.00   Average packs/day: 3.0 packs/day for 44.0 years  (132.0 ttl pk-yrs)   Types: Cigarettes, Pipe, Cigars  Smokeless Tobacco Never  Tobacco Comments   3.5-4pdd 04/30/2021    Current Outpatient Medications on File Prior to Visit  Medication Sig Dispense Refill   albuterol  (PROVENTIL ) (2.5 MG/3ML) 0.083% nebulizer solution Take 3 mLs (2.5 mg total) by nebulization every 6 (six) hours as needed for wheezing or shortness of breath. 75 mL 2   Aspirin -Caffeine 845-65 MG PACK Take 6-7 each by mouth daily as needed (migraines). BC/ goodie powder 56 each    fluticasone  (FLONASE ) 50 MCG/ACT nasal spray shake liquid AND INSTILL 1 SPRAY IN EACH NOSTRIL DAILY 16 g 2   nitroGLYCERIN  (NITROSTAT ) 0.4 MG SL tablet Place 1 tablet (0.4 mg total) under the tongue every 5 (five) minutes as needed for chest pain. Max of 3 doses within 15 minutes. If chest pain continues seek immediate medical attention. 25 tablet 2   ondansetron  (ZOFRAN -ODT) 8 MG disintegrating tablet Take 1 tablet (8 mg total) by mouth every 8 (eight) hours as needed for nausea or vomiting. 90 tablet 3   pregabalin  (LYRICA ) 75 MG capsule Take 1 capsule (75 mg total) by mouth 2 (two) times daily. 60 capsule 5   rOPINIRole  (REQUIP ) 4 MG tablet Take 1 tablet (4 mg total) by mouth 5 (five) times daily. 450 tablet 3   rosuvastatin  (CRESTOR ) 5 MG tablet Take 1 tablet (5 mg total) by mouth daily. 90 tablet 3   SUMAtriptan  (IMITREX ) 5 MG/ACT nasal spray Place one spray intranasally at onset of migraine. May repeat dose x 1 after 2 hours if headache persists. Max  40 mg/24 hours. 1 each 5   No current facility-administered medications on file prior to visit.    ROS see history of present illness  Objective  Physical Exam Vitals:   10/22/23 0807  BP: 120/64  Pulse: 65  Temp: 97.6 F (36.4 C)  SpO2: 96%    BP Readings from Last 3 Encounters:  10/22/23 120/64  08/31/23 110/78  08/07/23 120/80   Wt Readings from Last 3 Encounters:  10/22/23 201 lb (91.2 kg)  10/15/23 212 lb (96.2 kg)  08/31/23  211 lb 6.4 oz (95.9 kg)    Physical Exam Constitutional:      General: She is not in acute distress.    Appearance: Normal appearance.  HENT:     Head: Normocephalic.  Cardiovascular:     Rate and Rhythm: Normal rate and regular rhythm.     Heart sounds: Normal heart sounds.  Pulmonary:     Effort: Pulmonary effort is normal.     Breath sounds: Normal breath sounds.  Abdominal:     General: Abdomen is flat. A surgical scar is present. Bowel sounds are normal.     Palpations: Abdomen is soft.     Tenderness: There is abdominal tenderness in the epigastric area. There is no guarding.  Skin:    General: Skin is warm and dry.  Neurological:     General: No focal deficit present.     Mental Status: She is alert.  Psychiatric:        Mood and Affect: Mood normal.        Behavior: Behavior normal.    Assessment/Plan: Please see individual problem list.  Epigastric pain Assessment & Plan: Epigastric pain is present with a history of diverticulitis and colostomies. The pain is not currently severe but has been in the past, with no current signs of diverticulitis. Unintentional weight loss and dark stools are noted. Order a STAT CT scan of the abdomen/pelvis for further evaluation. Further work up pending results.   Orders: -     Comprehensive metabolic panel -     CT ABDOMEN PELVIS W CONTRAST; Future  Dark stools Assessment & Plan: There are reports of black stools, changes in stool consistency, and chronic nausea, with no bright red blood noted. Order blood work to check for anemia and other abnormalities. Consider referral to GI for upper endoscopy/colonoscopy. STAT CT Abd/Pelvis ordered today. Further work up pending results.   Orders: -     CBC with Differential/Platelet  Chronic nausea Assessment & Plan: This is a longstanding issue, exacerbated by lying down, leading to coughing and vomiting. Denies GERD symptoms. Consider evaluation for gastroesophageal reflux disease  (GERD) and potential treatment options if symptoms persist. Continue Zofran  as needed.     Return if symptoms worsen or fail to improve.   Leron Glance, NP-C [facility]

## 2023-10-22 NOTE — Telephone Encounter (Signed)
 Called pt as provider received a message that she refused her CT    Called to see what was going on. Pt informed me that she is unable to drink the contrast that it will make her puke before she can get it all the way down and that they informed her that it would be 2 hours before she gets a scan.   She stated she will not and cannot stay in that place for 2 hours and she will not drink the contrast. Pt stated that she will just deal with the pain and if she falls out she just falls out    Pt sated she appreciate all our help and trying but she will not do the CT. I encouraged her to let us  know if things worsen.

## 2023-10-23 ENCOUNTER — Encounter: Payer: Self-pay | Admitting: Nurse Practitioner

## 2023-10-23 ENCOUNTER — Ambulatory Visit
Admission: RE | Admit: 2023-10-23 | Discharge: 2023-10-23 | Disposition: A | Discharge status: Home / Self Care | Payer: Medicaid Other | Source: Ambulatory Visit | Attending: Nurse Practitioner | Admitting: Nurse Practitioner

## 2023-10-23 DIAGNOSIS — R1013 Epigastric pain: Secondary | ICD-10-CM | POA: Diagnosis not present

## 2023-10-23 DIAGNOSIS — R634 Abnormal weight loss: Secondary | ICD-10-CM | POA: Diagnosis not present

## 2023-10-23 DIAGNOSIS — K5732 Diverticulitis of large intestine without perforation or abscess without bleeding: Secondary | ICD-10-CM | POA: Diagnosis not present

## 2023-10-23 MED ORDER — IOHEXOL 300 MG/ML  SOLN
100.0000 mL | Freq: Once | INTRAMUSCULAR | Status: AC | PRN
  Administered 2023-10-23: 100 mL via INTRAVENOUS

## 2023-10-23 MED ORDER — IOHEXOL 9 MG/ML PO SOLN
500.0000 mL | ORAL | Status: AC
  Administered 2023-10-23: 900 mL via ORAL

## 2023-11-06 ENCOUNTER — Telehealth: Payer: Medicaid Other | Admitting: Nurse Practitioner

## 2023-11-06 ENCOUNTER — Encounter: Payer: Self-pay | Admitting: Nurse Practitioner

## 2023-11-06 VITALS — Ht 67.0 in | Wt 201.0 lb

## 2023-11-06 DIAGNOSIS — G4700 Insomnia, unspecified: Secondary | ICD-10-CM | POA: Diagnosis not present

## 2023-11-06 DIAGNOSIS — M545 Low back pain, unspecified: Secondary | ICD-10-CM | POA: Diagnosis not present

## 2023-11-06 DIAGNOSIS — F319 Bipolar disorder, unspecified: Secondary | ICD-10-CM

## 2023-11-06 DIAGNOSIS — F4310 Post-traumatic stress disorder, unspecified: Secondary | ICD-10-CM

## 2023-11-06 DIAGNOSIS — G2581 Restless legs syndrome: Secondary | ICD-10-CM

## 2023-11-06 DIAGNOSIS — G894 Chronic pain syndrome: Secondary | ICD-10-CM | POA: Diagnosis not present

## 2023-11-06 MED ORDER — GABAPENTIN 300 MG PO CAPS: 300.0000 mg | ORAL_CAPSULE | Freq: Every day | ORAL | 2 refills | Status: DC

## 2023-11-06 NOTE — Assessment & Plan Note (Signed)
 Sleep is disrupted, with frequent awakenings every two hours. Pain, anxiety, and stress may contribute. A previous sleep study showed no sleep apnea. Refer to Psychiatry for evaluation and management of psychological contributors. Discussed decreasing alcohol as it may contribute to sleep disturbances.

## 2023-11-06 NOTE — Progress Notes (Signed)
 MyChart Video Visit    Virtual Visit via Video Note   This visit type was conducted because this format is felt to be most appropriate for this patient at this time. Physical exam was limited by quality of the video and audio technology used for the visit. CMA was able to get the patient set up on a video visit.  Patient location: Home. Patient and provider in visit Provider location: Office  I discussed the limitations of evaluation and management by telemedicine and the availability of in person appointments. The patient expressed understanding and agreed to proceed.  Visit Date: 11/06/2023  Today's healthcare provider: Bethanie Dicker, NP     Subjective:    Patient ID: Kim [name], female    DOB: May 11, 1966, 58 y.o.   MRN: [id number]  Chief Complaint  Patient presents with   Follow-up    HPI  Discussed the use of AI scribe software for clinical note transcription with the patient, who gave verbal consent to proceed.  History of Present Illness   Kim [name] "Anders Simmonds" is a 58 year old female who presents with chronic pain and sleep disturbances.  She experiences chronic pain daily, described as severe and affecting her quality of life. The pain is primarily located in the back and hands, with additional pain in the feet and ankles. She experiences significant stiffness and difficulty with mobility, particularly in the morning. Despite numerous consultations, she feels that no effective treatment has been found. She is currently taking Lyrica, which has been increased once, but she feels it is not providing relief. She also takes [facility] powders for headaches and back pain, as over-the-counter medications like ibuprofen and Tylenol are not effective or cause stomach issues. She has a history of back pain attributed to an injury sustained while lifting a [employer] door. She has undergone physical therapy and pain management, including injections, but reports that these  treatments have not been effective.  She reports significant sleep disturbances, stating she rarely sleeps more than four hours a night and wakes up every two hours due to pain, nerves, and stress. She has a history of night terrors, which she distinguishes from nightmares, and has tried various medications in the past without success. She recalls being on a medication that helped with sleep and anxiety but cannot remember the name. She has not been on any long-term medication for stress or mood and has not found a new counselor since moving back from [place]. She has a history of restless leg syndrome for which she takes Requip, but she reports that it no longer makes her sleepy. She has tried gabapentin in the past in combination with her leg medication but does not recall the outcome.  She experiences significant anxiety and stress, which limits her ability to leave the house. She has a history of seeing counselors and psychiatrists but is currently not on any psychiatric medications. She wants to resume mental health support. She is concerned about weight gain as a side effect of medications, which she believes exacerbates her back pain.  Socially, she relies on food delivery services and finds it challenging to attend appointments. She has a history of substance use, including marijuana and alcohol, but reports reduced use as she ages. She has a history of cocaine use in her youth but has not engaged in other illicit drug use.      Past Medical History:  Diagnosis Date   Abnormal drug screen (01/08/2022 UDS) 02/26/2022   (01/08/2022)  abnormal UDS (+) for carboxy-THC (marijuana)    Abnormal MRI, cervical spine (01/23/2022) 02/26/2022   (01/23/2022) MRI CERVICAL SPINE FINDINGS:  Alignment: Mild reversal the normal cervical lordosis.     DISC LEVELS:  C3-4: Mild bilateral facet arthropathy  C4-5: Minimal disc bulge and mild bilateral facet arthropathy     IMPRESSION:  1. Mild multilevel degenerative  change without significant canal or foraminal stenosis.   Abnormal MRI, lumbar spine (10/28/2021) 12/23/2021   (10/28/2021) LUMBAR MRI FINDINGS:  Alignment: Is trace retrolisthesis of L2 on L3 and L3 on L4, not significantly changed. Alignment is otherwise normal.  Vertebrae: There is anterior compression deformity of the T12 vertebral body with up to approximately 25% loss of vertebral body height anteriorly, not significantly changed since 2021. The other vertebral body heights are preserved. There is mi   Abnormal MRI, thoracic spine (01/23/2022) 02/26/2022   (01/23/2022) MRI THORACIC SPINE FINDINGS:  Alignment: Mildly exaggerated thoracic kyphosis at T11-T12 due to T12 fracture described below.  Vertebrae: Remote T12 compression fracture, unchanged from 10/28/2021 MRI      DISC LEVELS:  Mild disc bulge at T11-12      IMPRESSION:  1. Mild multilevel degenerative change without significant canal or foraminal stenosis.  2. Remote T12 fracture with similar    Allergy    Angina pectoris (HCC) 01/07/2023   Aortic atherosclerosis (HCC) 01/14/2021   Arthritis    Asthma    Atherosclerotic heart disease of native coronary artery without angina pectoris    Bilateral stenosis of lateral recess of lumbar spine    Bipolar disorder (HCC)    Carpal tunnel syndrome (Bilateral) 01/08/2022   Centrilobular emphysema (HCC) 02/21/2016   Formatting of this note might be different from the original.  Last Assessment & Plan:   Formatting of this note might be different from the original.  Ongoing. Not well controlled.  Saw Pulmonology whom she felt didn't help her cough.  Requesting new referral to see a different Pulmonologist.  Referral was placed today during the visit.   Cervical facet syndrome 01/08/2022   Cervical radiculopathy 08/01/2020   Cervicalgia 01/08/2022   Cervicogenic headache 01/08/2022   Chronic cough 03/16/2022   Chronic hip pain (2ry area of Pain) (Bilateral) (L>R) 01/08/2022   Chronic low back  pain (1ry area of Pain) (Bilateral) (L>R) w/o sciatica 07/04/2021   Chronic lower extremity pain (3ry area of Pain) (Intermittent) (Left) 01/08/2022   Chronic neck and back pain (5th area of Pain) (Midline) 01/08/2022   Chronic obstructive pulmonary disease (HCC) 06/25/2011   Active smoker  - 04/30/2021  After extensive coaching inhaler device,  effectiveness =    90% with smi > resume stiolto     Formatting of this note might be different from the original.  Formatting of this note might be different from the original.  Active smoker  - 04/30/2021  After extensive coaching inhaler device,  effectiveness =    90% with smi > resume stiolto     Last Assessment & Plan:   Fo   Chronic pain syndrome 12/23/2021   Chronic sacroiliac joint pain (Bilateral) 02/26/2022   Chronic shoulder pain (6th area of Pain) (Bilateral) 01/08/2022   Chronic thoracic back pain (4th area of Pain) (Midline) (Bilateral) 01/08/2022   Cigarette smoker 05/01/2021   DDD (degenerative disc disease), cervical 12/23/2021   DDD (degenerative disc disease), lumbosacral 12/23/2021   (10/28/2021) LUMBAR MRI FINDINGS:  LEVELS:  There is disc desiccation without significant loss of height at L3-L4 and  L4-L5. There is disc desiccation and mild narrowing at L1-L2, L2-L3, and L5-S1.  L1-2: central protrusion  L2-3: disc bulge  L3-4: disc bulge with a left foraminal component and annular fissure  L4-5: diffuse disc bulge with a central annular fissure  L5-S1: diffuse disc bulge with   DDD (degenerative disc disease), thoracic 12/23/2021   Depression    Disorder of skeletal system 12/23/2021   Elevated alkaline phosphatase level 02/26/2022   (01/08/2022)- Normal ALP (Alkaline phosphatase) levels are between 44 -121 IU/L, for our Lab. High ALP could suggest liver damage or increased bone cell activity. If other tests such as bilirubin, aspartate aminotransferase (AST), or alanine aminotransferase (ALT) are also high, usually the increased ALP is  coming from the liver. Higher-than-normal ALP levels can be seen with: biliary obstruction;    Emphysema of lung (HCC)    Failure to attend appointment 10/20/2022   GERD (gastroesophageal reflux disease)    Grade 1 Retrolisthesis of cervical region (2 mm) (C4/C5) 12/23/2021   Grade 1 Retrolisthesis of L2/L3 and L3/L4 01/08/2022   (10/28/2021) LUMBAR MRI FINDINGS:  Alignment: Is trace retrolisthesis of L2 on L3 and L3 on L4.  LEVELS:  L2-3: There is a mild disc bulge and mild facet arthropathy  L3-4: There is a mild disc bulge with a left foraminal component and annular fissure and mild left worse than right facet arthropathy resulting in narrowing of the left subarticular zone with crowding of the traversing nerve root, an   Headache disorder 09/20/2020   History of ileus 02/19/2021   History of marijuana use 02/26/2022   (01/08/2022) abnormal UDS (+) for carboxy-THC (marijuana)   The electronic medical record also indicated positive results on 10/09/2012 and 11/25/2013.   Leg pain    bilateral   Lumbar disc annular tears 01/08/2022   (10/28/2021) LUMBAR MRI FINDINGS:  LEVELS:  L3-4: There is a mild disc bulge with a left foraminal component and annular fissure  L4-5: There is a diffuse disc bulge with a central annular fissure   Lumbar facet syndrome (Bilateral) 01/08/2022   Lumbar lateral recess stenosis (L3-4) (Left) 12/23/2021   Lumbar radiculopathy 08/01/2020   Lumbosacral facet arthropathy (Multilevel) (Bilateral) 12/23/2021   (10/28/2021) LUMBAR MRI FINDINGS:  LEVELS:  L2-3: mild facet arthropathy  L3-4: mild left worse than right facet arthropathy.  L4-5: bilateral facet arthropathy  L5-S1: bilateral facet arthropathy. There are trace bilateral facet joint effusions with mild perifacetal soft tissue edema at this level which could reflect a source of pain.     There is facet arthropathy most advanced at L4-L5 and L5-S   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) (Bilateral: L5-S1)  12/23/2021   (10/28/2021) LUMBAR MRI FINDINGS:  LEVELS:  L3-4: mild disc bulge with a left foraminal component resulting in mild left and no significant right neural foraminal stenosis. There is possible contact of the exiting left L3 nerve root by foraminal disc material.  L5-S1: diffuse disc bulge with bilateral foraminal components resulting in mild-to-moderate bilateral neural foraminal stenosis.   Lumbosacral lateral recess stenosis (L3-4, L5-S1) (Left) 01/08/2022   (10/28/2021) LUMBAR MRI FINDINGS:  LEVELS:  L3-4: narrowing of the left subarticular zone. There is possible contact of the exiting left L3 nerve root by foraminal disc material.  L5-S1: mild crowding of the left subarticular zone   Marijuana use 02/26/2022   (01/08/2022) abnormal UDS (+) for carboxy-THC (marijuana)    Migraines 07/02/2015   Mixed dyslipidemia 01/07/2023   Multiple pulmonary nodules determined by computed tomography of  lung 05/01/2021   Active smoker  03/07/21 (vs 12/04/20) new subpleural  area of airspace consolidation within the anterior basal right upper  lobe abutting the major fissure. This area has a mean derived  - CT 07/17/2021 lesions have either resolved or now change c/w inflammatory etiology plus emphysema  > repeat 18 mplaced  in reminder file      Formatting of this note might be different from the original.  Formattin   Nicotine dependence, cigarettes, w unsp disorders 06/25/2011   Numbness and tingling in hands (Bilateral) 01/08/2022   Occipital headache (Bilateral) 01/08/2022   Other specified chronic obstructive pulmonary disease (HCC) 06/25/2011   Other spondylosis, cervical region (C4/C5 and C5/C6) 12/23/2021   Pharmacologic therapy 12/23/2021   Problems influencing health status 12/23/2021   PTSD (post-traumatic stress disorder) 04/05/2020   Rectal bleeding 02/19/2021   RLS (restless legs syndrome) 07/02/2015   Spondylosis without myelopathy or radiculopathy, lumbosacral region 02/26/2022    Stress incontinence 11/01/2021   T12 compression fracture, sequela 08/01/2020   Thoracic facet syndrome 01/08/2022   Upper airway cough syndrome 05/01/2021   Onset 2021   - cyclical cough rx  04/30/2021 >>>        Past Surgical History:  Procedure Laterality Date   ABDOMINAL HYSTERECTOMY     APPENDECTOMY  1986   BREAST SURGERY     breast surgey Left    Cyst Excision   CARPAL TUNNEL RELEASE     COLON SURGERY     COLOSTOMY REVISION  2014   EYE SURGERY     LEFT HEART CATH AND CORONARY ANGIOGRAPHY N/A 01/12/2023   Procedure: LEFT HEART CATH AND CORONARY ANGIOGRAPHY;  Surgeon: Runell Gess, MD;  Location: [facility];  Service: Cardiovascular;  Laterality: N/A;   SMALL INTESTINE SURGERY     TOTAL ABDOMINAL HYSTERECTOMY W/ BILATERAL SALPINGOOPHORECTOMY  2008   endometriosis   TUBAL LIGATION      Family History  Problem Relation Age of Onset   Alcohol abuse Mother    Anxiety disorder Mother    Depression Mother    Early death Mother    Varicose Veins Mother    Alcohol abuse Father    Heart attack Father    Heart disease Brother    Heart attack Brother    Seizures Daughter    Heart attack Maternal Grandmother    Heart disease Maternal Grandmother    Heart attack Paternal Grandmother    Alcohol abuse Brother    Depression Brother    Early death Brother     Social History   Socioeconomic History   Marital status: Divorced    Spouse name: Not on file   Number of children: 1   Years of education: HS   Highest education level: Some college, no degree  Occupational History   Occupation: Disabled  Tobacco Use   Smoking status: Every Day    Current packs/day: 3.00    Average packs/day: 3.0 packs/day for 44.0 years (132.0 ttl pk-yrs)    Types: Cigarettes, Pipe, Cigars   Smokeless tobacco: Never   Tobacco comments:    3.5-4pdd 04/30/2021  Vaping Use   Vaping status: Former  Substance and Sexual Activity   Alcohol use: Yes    Alcohol/week: 42.0 standard  drinks of alcohol    Types: 42 Cans of beer per week   Drug use: Yes    Frequency: 7.0 times per week    Types: Marijuana   Sexual activity: Not Currently  Birth control/protection: Other-see comments    Comment: Menopausal Hysterectomy 2008  Other Topics Concern   Not on file  Social History Narrative   Lives at home alone.   Drinks two pots of coffee per day.   Right-handed.   Social Drivers of Corporate investment banker Strain: Low Risk  (08/05/2023)   Overall Financial Resource Strain (CARDIA)    Difficulty of Paying Living Expenses: Not hard at all  Recent Concern: Financial Resource Strain - High Risk (06/23/2023)   Overall Financial Resource Strain (CARDIA)    Difficulty of Paying Living Expenses: Very hard  Food Insecurity: No Food Insecurity (08/05/2023)   Hunger Vital Sign    Worried About Running Out of Food in the Last Year: Never true    Ran Out of Food in the Last Year: Never true  Transportation Needs: No Transportation Needs (08/05/2023)   PRAPARE - Administrator, Civil Service (Medical): No    Lack of Transportation (Non-Medical): No  Physical Activity: Sufficiently Active (08/05/2023)   Exercise Vital Sign    Days of Exercise per Week: 7 days    Minutes of Exercise per Session: 150+ min  Stress: Stress Concern Present (08/05/2023)   [employer] of Occupational Health - Occupational Stress Questionnaire    Feeling of Stress : Rather much  Social Connections: Socially Isolated (08/27/2023)   Social Connection and Isolation Panel [NHANES]    Frequency of Communication with Friends and Family: Once a week    Frequency of Social Gatherings with Friends and Family: Never    Attends Religious Services: Never    Database administrator or Organizations: No    Attends Engineer, structural: Not on file    Marital Status: Divorced  Catering manager Violence: Not on file    Outpatient Medications Prior to Visit  Medication Sig  Dispense Refill   albuterol (PROVENTIL) (2.5 MG/3ML) 0.083% nebulizer solution Take 3 mLs (2.5 mg total) by nebulization every 6 (six) hours as needed for wheezing or shortness of breath. 75 mL 2   Aspirin-Caffeine 845-65 MG PACK Take 6-7 each by mouth daily as needed (migraines). BC/ goodie powder 56 each    fluticasone (FLONASE) 50 MCG/ACT nasal spray shake liquid AND INSTILL 1 SPRAY IN EACH NOSTRIL DAILY 16 g 2   nitroGLYCERIN (NITROSTAT) 0.4 MG SL tablet Place 1 tablet (0.4 mg total) under the tongue every 5 (five) minutes as needed for chest pain. Max of 3 doses within 15 minutes. If chest pain continues seek immediate medical attention. 25 tablet 2   ondansetron (ZOFRAN-ODT) 8 MG disintegrating tablet Take 1 tablet (8 mg total) by mouth every 8 (eight) hours as needed for nausea or vomiting. 90 tablet 3   rOPINIRole (REQUIP) 4 MG tablet Take 1 tablet (4 mg total) by mouth 5 (five) times daily. 450 tablet 3   rosuvastatin (CRESTOR) 5 MG tablet Take 1 tablet (5 mg total) by mouth daily. 90 tablet 3   SUMAtriptan (IMITREX) 5 MG/ACT nasal spray Place one spray intranasally at onset of migraine. May repeat dose x 1 after 2 hours if headache persists. Max 40 mg/24 hours. 1 each 5   pregabalin (LYRICA) 75 MG capsule Take 1 capsule (75 mg total) by mouth 2 (two) times daily. 60 capsule 5   No facility-administered medications prior to visit.    Allergies  Allergen Reactions   Penicillin G     Other Reaction(s): Not available, Not available  Other Reaction(s): Not available  Penicillins Other (See Comments)    Unknown childhood reaction   Sulfa Antibiotics Swelling    ROS See HPI    Objective:    Physical Exam  Ht 5\' 7"  (1.702 m)   Wt 201 lb (91.2 kg)   BMI 31.48 kg/m  Wt Readings from Last 3 Encounters:  11/06/23 201 lb (91.2 kg)  10/22/23 201 lb (91.2 kg)  10/15/23 212 lb (96.2 kg)   GENERAL: alert, oriented, appears well and in no acute distress   HEENT: atraumatic,  conjunttiva clear, no obvious abnormalities on inspection of external nose and ears   NECK: normal movements of the head and neck   LUNGS: on inspection no signs of respiratory distress, breathing rate appears normal, no obvious gross SOB, gasping or wheezing   CV: no obvious cyanosis   MS: moves all visible extremities without noticeable abnormality   PSYCH/NEURO: pleasant and cooperative, no obvious depression or anxiety, speech and thought processing grossly intact    Assessment & Plan:   Problem List Items Addressed This Visit       Other   RLS (restless legs syndrome) (Chronic)   Requip is currently used, but there are reports of waking due to leg movements. Continue Requip as prescribed, will trial adding Gabapentin at bedtime to help with pain and restless leg.       Relevant Medications   gabapentin (NEURONTIN) 300 MG capsule   Chronic low back pain (1ry area of Pain) (Bilateral) (L>R) w/o sciatica (Chronic)   Relevant Medications   gabapentin (NEURONTIN) 300 MG capsule   Other Relevant Orders   Ambulatory referral to Physical Medicine Rehab   Chronic pain syndrome - Primary (Chronic)   Daily pain in the back and hands is not adequately managed with Lyrica. There is stiffness and morning mobility difficulty. Discontinue Lyrica and start Gabapentin 300mg  at bedtime for pain management and sleep. Will refer to Physical Medicine and Rehab for further evaluation and management.       Relevant Medications   gabapentin (NEURONTIN) 300 MG capsule   Other Relevant Orders   Ambulatory referral to Physical Medicine Rehab   PTSD (post-traumatic stress disorder)   Relevant Orders   Ambulatory referral to Psychiatry   Bipolar disorder (HCC)   There is a history of psychological treatment, but there is resistance to long-term medication due to concerns about side effects, especially weight gain. Night terrors are also reported. Refer to Psychiatry for further evaluation and  management.      Relevant Orders   Ambulatory referral to Psychiatry   Insomnia   Sleep is disrupted, with frequent awakenings every two hours. Pain, anxiety, and stress may contribute. A previous sleep study showed no sleep apnea. Refer to Psychiatry for evaluation and management of psychological contributors. Discussed decreasing alcohol as it may contribute to sleep disturbances.       Relevant Medications   gabapentin (NEURONTIN) 300 MG capsule    I have discontinued Kathrynn Running. [place] "Darlean"'s pregabalin. I am also having her start on gabapentin. Additionally, I am having her maintain her fluticasone, Aspirin-Caffeine, rosuvastatin, ondansetron, rOPINIRole, nitroGLYCERIN, albuterol, and SUMAtriptan.  Meds ordered this encounter  Medications   gabapentin (NEURONTIN) 300 MG capsule    Sig: Take 1 capsule (300 mg total) by mouth at bedtime.    Dispense:  30 capsule    Refill:  2    Supervising Provider:   Birdie Sons, ERIC G [[id number]]   I discussed the assessment and treatment plan with the patient. The  patient was provided an opportunity to ask questions and all were answered. The patient agreed with the plan and demonstrated an understanding of the instructions.   The patient was advised to call back or seek an in-person evaluation if the symptoms worsen or if the condition fails to improve as anticipated.   Bethanie Dicker, NP [facility] [phone] (phone) [phone] (fax)  [facility]

## 2023-11-06 NOTE — Assessment & Plan Note (Signed)
 There is a history of psychological treatment, but there is resistance to long-term medication due to concerns about side effects, especially weight gain. Night terrors are also reported. Refer to Psychiatry for further evaluation and management.

## 2023-11-06 NOTE — Assessment & Plan Note (Signed)
 Requip is currently used, but there are reports of waking due to leg movements. Continue Requip as prescribed, will trial adding Gabapentin at bedtime to help with pain and restless leg.

## 2023-11-06 NOTE — Assessment & Plan Note (Signed)
 Daily pain in the back and hands is not adequately managed with Lyrica. There is stiffness and morning mobility difficulty. Discontinue Lyrica and start Gabapentin 300mg  at bedtime for pain management and sleep. Will refer to Physical Medicine and Rehab for further evaluation and management.

## 2023-11-09 ENCOUNTER — Ambulatory Visit (INDEPENDENT_AMBULATORY_CARE_PROVIDER_SITE_OTHER): Payer: Medicaid Other | Admitting: Pulmonary Disease

## 2023-11-09 ENCOUNTER — Telehealth: Payer: Self-pay

## 2023-11-09 ENCOUNTER — Encounter: Payer: Self-pay | Admitting: Pulmonary Disease

## 2023-11-09 VITALS — BP 102/70 | HR 87 | Temp 97.6°F | Ht 67.0 in | Wt 202.0 lb

## 2023-11-09 DIAGNOSIS — Z720 Tobacco use: Secondary | ICD-10-CM | POA: Diagnosis not present

## 2023-11-09 DIAGNOSIS — R0602 Shortness of breath: Secondary | ICD-10-CM

## 2023-11-09 MED ORDER — TRELEGY ELLIPTA 100-62.5-25 MCG/ACT IN AEPB
1.0000 | INHALATION_SPRAY | Freq: Every day | RESPIRATORY_TRACT | 3 refills | Status: DC
Start: 2023-11-09 — End: 2024-05-19

## 2023-11-09 MED ORDER — TRELEGY ELLIPTA 100-62.5-25 MCG/ACT IN AEPB
1.0000 | INHALATION_SPRAY | Freq: Every day | RESPIRATORY_TRACT | 0 refills | Status: DC
Start: 2023-11-09 — End: 2024-05-19

## 2023-11-09 NOTE — Telephone Encounter (Signed)
*  Pulm  Pharmacy Patient Advocate Encounter   Received notification from CoverMyMeds that prior authorization for Trelegy Ellipta 100-62.5-25MCG/ACT aerosol powder  is required/requested.   Insurance verification completed.   The patient is insured through [facility] .   Per test claim: PA required; PA submitted to above mentioned insurance via CoverMyMeds Key/confirmation #/[id number] [facility] Status is pending

## 2023-11-09 NOTE — Addendum Note (Signed)
 Addended by: Bonney Leitz on: 11/09/2023 04:33 PM   Modules accepted: Orders

## 2023-11-09 NOTE — Progress Notes (Signed)
 Synopsis: Referred in by Bethanie Dicker, NP   Subjective:   PATIENT ID: Kim [name], Kim [name], Kim [name]  Chief Complaint  Patient presents with   Consult    Cough, shortness of breath and wheezing.     HPI Kim [name] is a pleasant 58 year old female patient with a past medical history of tobacco use disorder, alcohol use disorder, hyperlipidemia and allergic rhinitis presenting today to the pulmonary clinic for ongoing productive cough with progressively worsening shortness of breath.  She reports that she has been having worsening shortness of breath on exertion for years however most recently this has been significantly worse associated with cough and sputum production.  Also associated with back pain.  She denies any wheezing but does report that cold air bothers her.  She reports poor appetite but stable weight.  She is not on any inhalers but does intermittently use some nebulizer that she has at home.  Family history -grandmother with COPD.  Social history -smokes 3 to 4 packs/day and started at 58 years old.  She drinks 6 beers a day.  She is on disability and does not work.  She lives alone divorced and has no kids.  She does have 1 dog at home.  ROS All systems were reviewed and are negative except  for the above.  Objective:   Vitals:   11/09/23 1015  BP: 102/70  Pulse: 87  Temp: 97.6 F (36.4 C)  TempSrc: Temporal  SpO2: 97%  Weight: 202 lb (91.6 kg)  Height: 5\' 7"  (1.702 m)   97% on RA BMI Readings from Last 3 Encounters:  11/09/23 31.64 kg/m  11/06/23 31.48 kg/m  10/22/23 31.48 kg/m   Wt Readings from Last 3 Encounters:  11/09/23 202 lb (91.6 kg)  11/06/23 201 lb (91.2 kg)  10/22/23 201 lb (91.2 kg)    Physical Exam GEN: NAD, HEENT: Supple Neck, Reactive Pupils, EOMI  CVS: Normal S1, Normal S2, RRR, No murmurs or ES appreciated  Lungs: Poor air movement but overall clear.  Abdomen: Soft, non tender, non  distended, + BS  Extremities: Warm and well perfused, No edema  Skin: No suspicious lesions appreciated  Psych: Normal Affect  Ancillary Information   CBC    Component Value Date/Time   WBC 6.5 10/22/2023 0834   RBC 4.43 10/22/2023 0834   HGB 14.4 10/22/2023 0834   HGB 15.2 01/07/2023 1050   HCT 42.2 10/22/2023 0834   HCT 43.8 01/07/2023 1050   PLT 234.0 10/22/2023 0834   PLT 239 01/07/2023 1050   MCV 95.3 10/22/2023 0834   MCV 92 01/07/2023 1050   MCV 96 10/13/2014 1611   MCH 32.0 01/07/2023 1050   MCH 31.9 12/06/2015 1439   MCHC 34.0 10/22/2023 0834   RDW 13.5 10/22/2023 0834   RDW 12.0 01/07/2023 1050   RDW 13.7 10/13/2014 1611   LYMPHSABS 1.7 10/22/2023 0834   LYMPHSABS 1.6 05/26/2022 1028   LYMPHSABS 0.8 (L) 08/11/2013 0417   MONOABS 0.5 10/22/2023 0834   MONOABS 0.5 08/11/2013 0417   EOSABS 0.2 10/22/2023 0834   EOSABS 0.1 05/26/2022 1028   EOSABS 0.0 08/11/2013 0417   BASOSABS 0.0 10/22/2023 0834   BASOSABS 0.1 05/26/2022 1028   BASOSABS 0.0 08/11/2013 0417         No data to display         CXR in 2023 with indeterminate nodular density projecting over the left midlung.   No pfts on file  Assessment & Plan:  Kim [name] is a pleasant 58 year old female patient with a past medical history of tobacco use disorder, alcohol use disorder, hyperlipidemia and allergic rhinitis presenting today to the pulmonary clinic for ongoing productive cough with progressively worsening shortness of breath.  #Shortness of breath  Suspecting COPD with her history of heavy tobacco use.  Malignant process is also on the differential.  []  Obtain PFTs []  Obtain chest x-ray for today []  Schedule CT chest without contrast. []  Start fluticasone-vilanterol-umeclidinium [Trelegy Ellipta] 100 1 puff once a day.  Advised mouth rinsing after each use. []  Continue with albuterol on an as-needed basis.  # Heavy tobacco use. Discussed importance of smoking cessation.  Will  discuss strategy on the follow-up visit.  CT chest without contrast as above.  Should that return nonconcerning will enroll in low-dose CT scan program.  Return in about 3 months (around 02/06/2024).  I spent 60 minutes caring for this patient today, including preparing to see the patient, obtaining a medical history , reviewing a separately obtained history, performing a medically appropriate examination and/or evaluation, counseling and educating the patient/family/caregiver, ordering medications, tests, or procedures, documenting clinical information in the electronic health record, and independently interpreting results (not separately reported/billed) and communicating results to the patient/family/caregiver  Janann Colonel, MD [facility] 11/09/2023 1:18 PM

## 2023-11-10 ENCOUNTER — Ambulatory Visit (INDEPENDENT_AMBULATORY_CARE_PROVIDER_SITE_OTHER): Payer: Medicaid Other | Admitting: Podiatry

## 2023-11-10 ENCOUNTER — Ambulatory Visit (INDEPENDENT_AMBULATORY_CARE_PROVIDER_SITE_OTHER): Payer: Medicaid Other

## 2023-11-10 ENCOUNTER — Other Ambulatory Visit: Payer: Self-pay | Admitting: Nurse Practitioner

## 2023-11-10 DIAGNOSIS — Z01818 Encounter for other preprocedural examination: Secondary | ICD-10-CM | POA: Diagnosis not present

## 2023-11-10 DIAGNOSIS — M216X2 Other acquired deformities of left foot: Secondary | ICD-10-CM

## 2023-11-10 DIAGNOSIS — G894 Chronic pain syndrome: Secondary | ICD-10-CM

## 2023-11-10 NOTE — Progress Notes (Signed)
 Subjective:  Patient ID: Kim [name], female    DOB: Oct 24, 1965,  MRN: [id number]  Chief Complaint  Patient presents with   Callouses    58 y.o. female presents with the above complaint.  Patient presents for follow-up of left submetatarsal 3 benign skin lesion/skin neoplasm with underlying plantarflexed third metatarsal pain on palpation.   Review of Systems: Negative except as noted in the HPI. Denies N/V/F/Ch.  Past Medical History:  Diagnosis Date   Abnormal drug screen (01/08/2022 UDS) 02/26/2022   (01/08/2022) abnormal UDS (+) for carboxy-THC (marijuana)    Abnormal MRI, cervical spine (01/23/2022) 02/26/2022   (01/23/2022) MRI CERVICAL SPINE FINDINGS:  Alignment: Mild reversal the normal cervical lordosis.     DISC LEVELS:  C3-4: Mild bilateral facet arthropathy  C4-5: Minimal disc bulge and mild bilateral facet arthropathy     IMPRESSION:  1. Mild multilevel degenerative change without significant canal or foraminal stenosis.   Abnormal MRI, lumbar spine (10/28/2021) 12/23/2021   (10/28/2021) LUMBAR MRI FINDINGS:  Alignment: Is trace retrolisthesis of L2 on L3 and L3 on L4, not significantly changed. Alignment is otherwise normal.  Vertebrae: There is anterior compression deformity of the T12 vertebral body with up to approximately 25% loss of vertebral body height anteriorly, not significantly changed since 2021. The other vertebral body heights are preserved. There is mi   Abnormal MRI, thoracic spine (01/23/2022) 02/26/2022   (01/23/2022) MRI THORACIC SPINE FINDINGS:  Alignment: Mildly exaggerated thoracic kyphosis at T11-T12 due to T12 fracture described below.  Vertebrae: Remote T12 compression fracture, unchanged from 10/28/2021 MRI      DISC LEVELS:  Mild disc bulge at T11-12      IMPRESSION:  1. Mild multilevel degenerative change without significant canal or foraminal stenosis.  2. Remote T12 fracture with similar    Allergy    Angina pectoris (HCC) 01/07/2023   Aortic  atherosclerosis (HCC) 01/14/2021   Arthritis    Asthma    Atherosclerotic heart disease of native coronary artery without angina pectoris    Bilateral stenosis of lateral recess of lumbar spine    Bipolar disorder (HCC)    Carpal tunnel syndrome (Bilateral) 01/08/2022   Centrilobular emphysema (HCC) 02/21/2016   Formatting of this note might be different from the original.  Last Assessment & Plan:   Formatting of this note might be different from the original.  Ongoing. Not well controlled.  Saw Pulmonology whom she felt didn't help her cough.  Requesting new referral to see a different Pulmonologist.  Referral was placed today during the visit.   Cervical facet syndrome 01/08/2022   Cervical radiculopathy 08/01/2020   Cervicalgia 01/08/2022   Cervicogenic headache 01/08/2022   Chronic cough 03/16/2022   Chronic hip pain (2ry area of Pain) (Bilateral) (L>R) 01/08/2022   Chronic low back pain (1ry area of Pain) (Bilateral) (L>R) w/o sciatica 07/04/2021   Chronic lower extremity pain (3ry area of Pain) (Intermittent) (Left) 01/08/2022   Chronic neck and back pain (5th area of Pain) (Midline) 01/08/2022   Chronic obstructive pulmonary disease (HCC) 06/25/2011   Active smoker  - 04/30/2021  After extensive coaching inhaler device,  effectiveness =    90% with smi > resume stiolto     Formatting of this note might be different from the original.  Formatting of this note might be different from the original.  Active smoker  - 04/30/2021  After extensive coaching inhaler device,  effectiveness =    90% with smi > resume stiolto  Last Assessment & Plan:   Fo   Chronic pain syndrome 12/23/2021   Chronic sacroiliac joint pain (Bilateral) 02/26/2022   Chronic shoulder pain (6th area of Pain) (Bilateral) 01/08/2022   Chronic thoracic back pain (4th area of Pain) (Midline) (Bilateral) 01/08/2022   Cigarette smoker 05/01/2021   DDD (degenerative disc disease), cervical 12/23/2021   DDD (degenerative  disc disease), lumbosacral 12/23/2021   (10/28/2021) LUMBAR MRI FINDINGS:  LEVELS:  There is disc desiccation without significant loss of height at L3-L4 and L4-L5. There is disc desiccation and mild narrowing at L1-L2, L2-L3, and L5-S1.  L1-2: central protrusion  L2-3: disc bulge  L3-4: disc bulge with a left foraminal component and annular fissure  L4-5: diffuse disc bulge with a central annular fissure  L5-S1: diffuse disc bulge with   DDD (degenerative disc disease), thoracic 12/23/2021   Depression    Disorder of skeletal system 12/23/2021   Elevated alkaline phosphatase level 02/26/2022   (01/08/2022)- Normal ALP (Alkaline phosphatase) levels are between 44 -121 IU/L, for our Lab. High ALP could suggest liver damage or increased bone cell activity. If other tests such as bilirubin, aspartate aminotransferase (AST), or alanine aminotransferase (ALT) are also high, usually the increased ALP is coming from the liver. Higher-than-normal ALP levels can be seen with: biliary obstruction;    Emphysema of lung (HCC)    Failure to attend appointment 10/20/2022   GERD (gastroesophageal reflux disease)    Grade 1 Retrolisthesis of cervical region (2 mm) (C4/C5) 12/23/2021   Grade 1 Retrolisthesis of L2/L3 and L3/L4 01/08/2022   (10/28/2021) LUMBAR MRI FINDINGS:  Alignment: Is trace retrolisthesis of L2 on L3 and L3 on L4.  LEVELS:  L2-3: There is a mild disc bulge and mild facet arthropathy  L3-4: There is a mild disc bulge with a left foraminal component and annular fissure and mild left worse than right facet arthropathy resulting in narrowing of the left subarticular zone with crowding of the traversing nerve root, an   Headache disorder 09/20/2020   History of ileus 02/19/2021   History of marijuana use 02/26/2022   (01/08/2022) abnormal UDS (+) for carboxy-THC (marijuana)   The electronic medical record also indicated positive results on 10/09/2012 and 11/25/2013.   Leg pain    bilateral   Lumbar disc  annular tears 01/08/2022   (10/28/2021) LUMBAR MRI FINDINGS:  LEVELS:  L3-4: There is a mild disc bulge with a left foraminal component and annular fissure  L4-5: There is a diffuse disc bulge with a central annular fissure   Lumbar facet syndrome (Bilateral) 01/08/2022   Lumbar lateral recess stenosis (L3-4) (Left) 12/23/2021   Lumbar radiculopathy 08/01/2020   Lumbosacral facet arthropathy (Multilevel) (Bilateral) 12/23/2021   (10/28/2021) LUMBAR MRI FINDINGS:  LEVELS:  L2-3: mild facet arthropathy  L3-4: mild left worse than right facet arthropathy.  L4-5: bilateral facet arthropathy  L5-S1: bilateral facet arthropathy. There are trace bilateral facet joint effusions with mild perifacetal soft tissue edema at this level which could reflect a source of pain.     There is facet arthropathy most advanced at L4-L5 and L5-S   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) (Bilateral: L5-S1) 12/23/2021   (10/28/2021) LUMBAR MRI FINDINGS:  LEVELS:  L3-4: mild disc bulge with a left foraminal component resulting in mild left and no significant right neural foraminal stenosis. There is possible contact of the exiting left L3 nerve root by foraminal disc material.  L5-S1: diffuse disc bulge with bilateral foraminal components resulting in mild-to-moderate bilateral  neural foraminal stenosis.   Lumbosacral lateral recess stenosis (L3-4, L5-S1) (Left) 01/08/2022   (10/28/2021) LUMBAR MRI FINDINGS:  LEVELS:  L3-4: narrowing of the left subarticular zone. There is possible contact of the exiting left L3 nerve root by foraminal disc material.  L5-S1: mild crowding of the left subarticular zone   Marijuana use 02/26/2022   (01/08/2022) abnormal UDS (+) for carboxy-THC (marijuana)    Migraines 07/02/2015   Mixed dyslipidemia 01/07/2023   Multiple pulmonary nodules determined by computed tomography of lung 05/01/2021   Active smoker  03/07/21 (vs 12/04/20) new subpleural  area of airspace consolidation within the  anterior basal right upper  lobe abutting the major fissure. This area has a mean derived  - CT 07/17/2021 lesions have either resolved or now change c/w inflammatory etiology plus emphysema  > repeat 18 mplaced  in reminder file      Formatting of this note might be different from the original.  Formattin   Nicotine dependence, cigarettes, w unsp disorders 06/25/2011   Numbness and tingling in hands (Bilateral) 01/08/2022   Occipital headache (Bilateral) 01/08/2022   Other specified chronic obstructive pulmonary disease (HCC) 06/25/2011   Other spondylosis, cervical region (C4/C5 and C5/C6) 12/23/2021   Pharmacologic therapy 12/23/2021   Problems influencing health status 12/23/2021   PTSD (post-traumatic stress disorder) 04/05/2020   Rectal bleeding 02/19/2021   RLS (restless legs syndrome) 07/02/2015   Spondylosis without myelopathy or radiculopathy, lumbosacral region 02/26/2022   Stress incontinence 11/01/2021   T12 compression fracture, sequela 08/01/2020   Thoracic facet syndrome 01/08/2022   Upper airway cough syndrome 05/01/2021   Onset 2021   - cyclical cough rx  04/30/2021 >>>        Current Outpatient Medications:    albuterol (PROVENTIL) (2.5 MG/3ML) 0.083% nebulizer solution, Take 3 mLs (2.5 mg total) by nebulization every 6 (six) hours as needed for wheezing or shortness of breath., Disp: 75 mL, Rfl: 2   Aspirin-Caffeine 845-65 MG PACK, Take 6-7 each by mouth daily as needed (migraines). BC/ goodie powder, Disp: 56 each, Rfl:    fluticasone (FLONASE) 50 MCG/ACT nasal spray, shake liquid AND INSTILL 1 SPRAY IN EACH NOSTRIL DAILY, Disp: 16 g, Rfl: 2   Fluticasone-Umeclidin-Vilant (TRELEGY ELLIPTA) 100-62.5-25 MCG/ACT AEPB, Inhale 1 puff into the lungs daily., Disp: 3 each, Rfl: 3   Fluticasone-Umeclidin-Vilant (TRELEGY ELLIPTA) 100-62.5-25 MCG/ACT AEPB, Inhale 1 puff into the lungs daily., Disp: 28 each, Rfl: 0   gabapentin (NEURONTIN) 300 MG capsule, Take 1 capsule (300 mg total)  by mouth at bedtime. (Patient not taking: Reported on 11/09/2023), Disp: 30 capsule, Rfl: 2   nitroGLYCERIN (NITROSTAT) 0.4 MG SL tablet, Place 1 tablet (0.4 mg total) under the tongue every 5 (five) minutes as needed for chest pain. Max of 3 doses within 15 minutes. If chest pain continues seek immediate medical attention., Disp: 25 tablet, Rfl: 2   ondansetron (ZOFRAN-ODT) 8 MG disintegrating tablet, Take 1 tablet (8 mg total) by mouth every 8 (eight) hours as needed for nausea or vomiting., Disp: 90 tablet, Rfl: 3   rOPINIRole (REQUIP) 4 MG tablet, Take 1 tablet (4 mg total) by mouth 5 (five) times daily., Disp: 450 tablet, Rfl: 3   rosuvastatin (CRESTOR) 5 MG tablet, Take 1 tablet (5 mg total) by mouth daily., Disp: 90 tablet, Rfl: 3   SUMAtriptan (IMITREX) 5 MG/ACT nasal spray, Place one spray intranasally at onset of migraine. May repeat dose x 1 after 2 hours if headache persists. Max 40 mg/24  hours., Disp: 1 each, Rfl: 5  Social History   Tobacco Use  Smoking Status Every Day   Current packs/day: 3.00   Average packs/day: 3.0 packs/day for 42.2 years (126.5 ttl pk-yrs)   Types: Cigarettes, Pipe, Cigars   Start date: 1983  Smokeless Tobacco Never  Tobacco Comments   3.5-4pdd 04/30/2021    Allergies  Allergen Reactions   Penicillin G     Other Reaction(s): Not available, Not available  Other Reaction(s): Not available   Penicillins Other (See Comments)    Unknown childhood reaction   Sulfa Antibiotics Swelling   Objective:  There were no vitals filed for this visit. There is no height or weight on file to calculate BMI. Constitutional Well developed. Well nourished.  Vascular Dorsalis pedis pulses palpable bilaterally. Posterior tibial pulses palpable bilaterally. Capillary refill normal to all digits.  No cyanosis or clubbing noted. Pedal hair growth normal.  Neurologic Normal speech. Oriented to person, place, and time. Epicritic sensation to light touch grossly  present bilaterally.  Dermatologic Left submetatarsal 3 skin neoplasm pain on palpation central nucleated core noted.  Plantarflexed left third metatarsal noted  Orthopedic: Normal joint ROM without pain or crepitus bilaterally. No visible deformities. No bony tenderness.   Radiographs: 3 views of skeletally mature adult left foot: Plantarflexed third metatarsal noted.  No other bony abnormalities identified Assessment:   1. Plantar flexed metatarsal, left   2. Encounter for preoperative examination for general surgical procedure    Plan:  Patient was evaluated and treated and all questions answered.  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  I discussed my preoperative intra postop plan with the patient in extensive detail she states understand like to proceed with surgery -Informed surgical risk consent was reviewed and read aloud to the patient.  I reviewed the films.  I have discussed my findings with the patient in great detail.  I have discussed all risks including but not limited to infection, stiffness, scarring, limp, disability, deformity, damage to blood vessels and nerves, numbness, poor healing, need for braces, arthritis, chronic pain, amputation, death.  All benefits and realistic expectations discussed in great detail.  I have made no promises as to the outcome.  I have provided realistic expectations.  I have offered the patient a 2nd opinion, which they have declined and assured me they preferred to proceed despite the risks   No follow-ups on file.

## 2023-11-10 NOTE — Telephone Encounter (Signed)
 Pharmacy Patient Advocate Encounter  Received notification from [facility] that Prior Authorization for Trelegy Ellipta 100-62.5-25MCG/ACT aerosol powder  has been DENIED.  Full denial letter will be uploaded to the media tab. See denial reason below.   PA #/Case ID/Reference #: Per your health plan's criteria, this drug is covered if you meet the following: One of the following: (i) You have failed two preferred drugs as confirmed by claims history or submission of medical records: Advair Diskus, Dulera, Symbicort. (ii) You cannot use two preferred drugs (please specify contraindication or intolerance). The information provided does not show that you meet the criteria listed above. Please speak with your doctor about your choices.

## 2023-11-12 ENCOUNTER — Telehealth: Payer: Self-pay

## 2023-11-12 NOTE — Telephone Encounter (Signed)
 Received surgery paperwork from Dr. Allena Katz. Left a message for [facility] to call and schedule surgery

## 2023-11-18 ENCOUNTER — Telehealth: Payer: Self-pay

## 2023-11-18 MED ORDER — BREZTRI AEROSPHERE 160-9-4.8 MCG/ACT IN AERO: 2.0000 | INHALATION_SPRAY | Freq: Two times a day (BID) | RESPIRATORY_TRACT | 3 refills | Status: DC

## 2023-11-18 NOTE — Telephone Encounter (Signed)
 Copied from CRM [id number]. Topic: Referral - Status >> Nov 18, 2023  8:45 AM Adele Barthel wrote: Reason for CRM:   [name] from [facility] is contacting clinic regarding a recent referral received by their office. Whitney the NP at the clinic believes due to her medical history of chronic pain syndrome and substance abuse, it would be more appropriate to refer her to a pain clinic.

## 2023-11-18 NOTE — Addendum Note (Signed)
 Addended by: Janann Colonel on: 11/18/2023 02:36 PM   Modules accepted: Orders

## 2023-11-20 ENCOUNTER — Other Ambulatory Visit (HOSPITAL_COMMUNITY): Payer: Self-pay

## 2023-11-20 ENCOUNTER — Telehealth: Payer: Self-pay

## 2023-11-20 NOTE — Telephone Encounter (Signed)
 Pharmacy Patient Advocate Encounter   Received notification from CoverMyMeds that prior authorization for Breztri Aerosphere 160-9-4.8MCG/ACT aerosol is required/requested.   Insurance verification completed.   The patient is insured through [facility] .   Per test claim: PA required; PA submitted to above mentioned insurance via CoverMyMeds Key/confirmation #/EOC [id number] Status is pending

## 2023-11-21 DIAGNOSIS — M7711 Lateral epicondylitis, right elbow: Secondary | ICD-10-CM | POA: Diagnosis not present

## 2023-11-21 DIAGNOSIS — S5001XA Contusion of right elbow, initial encounter: Secondary | ICD-10-CM | POA: Diagnosis not present

## 2023-11-26 NOTE — Telephone Encounter (Signed)
 Insurance is needing the patient to try and fail symbicort before covering the breztri

## 2023-11-26 NOTE — Telephone Encounter (Signed)
 Message sent to patient asking if they had tried both alternatives and if there was any reason no longer taking.

## 2023-11-26 NOTE — Telephone Encounter (Signed)
 Pharmacy Patient Advocate Encounter  Received notification from [facility] that Prior Authorization for [facility] Aerosphere 160-9-4.8MCG/ACT aerosol has been DENIED.  Full denial letter will be uploaded to the media tab. See denial reason below.  Per your health plan's criteria, this drug is covered if you meet the following: One of the following: (1) You have failed two preferred drugs as confirmed by claims history or submission of medical records. The preferred drugs: Advair Diskus, Symbicort Inhaler. (2) You cannot use two preferred drugs (please specify contraindication or intolerance).   PA #/Case ID/Reference #: [id number]

## 2023-11-26 NOTE — Telephone Encounter (Signed)
 Information sent top provider.

## 2023-11-27 MED ORDER — BUDESONIDE-FORMOTEROL FUMARATE 160-4.5 MCG/ACT IN AERO: 2.0000 | INHALATION_SPRAY | Freq: Two times a day (BID) | RESPIRATORY_TRACT | 12 refills | Status: DC

## 2023-11-27 NOTE — Addendum Note (Signed)
 Addended by: Janann Colonel on: 11/27/2023 01:27 PM   Modules accepted: Orders

## 2023-11-30 ENCOUNTER — Ambulatory Visit: Admission: RE | Admit: 2023-11-30 | Payer: Medicaid Other | Source: Ambulatory Visit

## 2023-12-01 ENCOUNTER — Ambulatory Visit
Admission: RE | Admit: 2023-12-01 | Discharge: 2023-12-01 | Disposition: A | Discharge status: Home / Self Care | Source: Home / Self Care | Attending: Pulmonary Disease | Admitting: Pulmonary Disease

## 2023-12-01 ENCOUNTER — Ambulatory Visit
Admission: RE | Admit: 2023-12-01 | Discharge: 2023-12-01 | Disposition: A | Discharge status: Home / Self Care | Source: Ambulatory Visit | Attending: Pulmonary Disease | Admitting: Pulmonary Disease

## 2023-12-01 ENCOUNTER — Ambulatory Visit
Admission: RE | Admit: 2023-12-01 | Discharge: 2023-12-01 | Disposition: A | Discharge status: Home / Self Care | Source: Ambulatory Visit | Attending: Pulmonary Disease

## 2023-12-01 DIAGNOSIS — R0602 Shortness of breath: Secondary | ICD-10-CM | POA: Diagnosis not present

## 2023-12-01 DIAGNOSIS — J9811 Atelectasis: Secondary | ICD-10-CM | POA: Diagnosis not present

## 2023-12-01 DIAGNOSIS — J432 Centrilobular emphysema: Secondary | ICD-10-CM | POA: Diagnosis not present

## 2023-12-01 DIAGNOSIS — R918 Other nonspecific abnormal finding of lung field: Secondary | ICD-10-CM | POA: Diagnosis not present

## 2023-12-01 DIAGNOSIS — R911 Solitary pulmonary nodule: Secondary | ICD-10-CM | POA: Diagnosis not present

## 2023-12-01 NOTE — Telephone Encounter (Signed)
 Patient notified

## 2023-12-07 ENCOUNTER — Encounter: Payer: Self-pay | Admitting: Pulmonary Disease

## 2023-12-07 DIAGNOSIS — R911 Solitary pulmonary nodule: Secondary | ICD-10-CM

## 2023-12-08 NOTE — Telephone Encounter (Signed)
 Discussed with patient over the phone. We need a repeat CT chest in 6 monhts and PFTs to be scheduled. Thanks.

## 2023-12-14 NOTE — Progress Notes (Unsigned)
 PROVIDER NOTE: Information contained herein reflects review and annotations entered in association with encounter. Interpretation of such information and data should be left to medically-trained personnel. Information provided to patient can be located elsewhere in the medical record under "Patient Instructions". Document created using STT-dictation technology, any transcriptional errors that may result from process are unintentional.    Patient: Kim [name]  Service Category: E/M  Provider: Oswaldo Done, MD  DOB: Aug 18, 1966  DOS: 12/16/2023  Referring Provider: Bethanie Dicker, NP  MRN: [id number]  Specialty: Interventional Pain Management  PCP: Bethanie Dicker, NP  Type: Established Patient  Setting: Ambulatory outpatient    Location: Office  Delivery: Face-to-face     HPI  [name], a 58 y.o. year old female, is here today because of her No primary diagnosis found.. [name] primary complain today is No chief complaint on file.  Pertinent problems: 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) (Bilateral: L5-S1); Lumbar lateral recess stenosis (L3-4) (Left); Grade 1 Retrolisthesis of L2/L3 and L3/L4; Lumbar disc annular tears; Lumbosacral lateral recess stenosis (L3-4, L5-S1) (Left); Chronic hip pain (2ry area of Pain) (Bilateral) (L>R); Chronic lower extremity pain (3ry area of Pain)  (Intermittent) (Left); Chronic thoracic back pain (4th area of Pain) (Midline) (Bilateral); Chronic neck and back pain (5th area of Pain) (Midline); Cervicalgia; Cervicogenic headache; Occipital headache (Bilateral); Chronic shoulder pain (6th area of Pain) (Bilateral); Carpal tunnel syndrome (Bilateral); Numbness and tingling in hands (Bilateral); Lumbar facet syndrome (Bilateral); Thoracic facet syndrome; Cervical facet syndrome; Spondylosis without myelopathy or radiculopathy, lumbosacral region; Chronic sacroiliac joint pain (Bilateral); Abnormal MRI, cervical spine (01/23/2022); and Abnormal MRI, thoracic spine (01/23/2022) on their pertinent problem list. Pain Assessment: Severity of   is reported as a  /10. Location:    / . Onset:  . Quality:  . Timing:  . Modifying factor(s):  Marland Kitchen Vitals:  vitals were not taken for this visit.  BMI: Estimated body mass index is 31.64 kg/m as calculated from the following:   Height as of 11/09/23: 5\' 7"  (1.702 m).   Weight as of 11/09/23: 202 lb (91.6 kg). Last encounter: Visit date not found. Last procedure: Visit date not found.  Reason for encounter:  *** . ***  Discussed the use of AI scribe software for clinical note transcription with the patient, who gave verbal consent to proceed.  History of Present Illness          Pharmacotherapy Assessment  Analgesic: No chronic opioid analgesics therapy prescribed by our practice. See 01/08/22 UDS. (+) THC. MME/day: 0 mg/day   Monitoring: [place] PMP: PDMP reviewed during this encounter.       Pharmacotherapy: No side-effects or adverse reactions reported. Compliance: No problems identified. Effectiveness: Clinically acceptable.  No notes on file  No results found for: "CBDTHCR" No results found for: "D8THCCBX" No results found for: "D9THCCBX"  UDS:  Summary  Date Value Ref Range Status  01/08/2022 Note  Final    Comment:    ==================================================================== Compliance  Drug Analysis, Ur ==================================================================== Test  Result       Flag       Units  Drug Present not Declared for Prescription Verification   Carboxy-THC                    46           UNEXPECTED ng/mg creat    Carboxy-THC is a metabolite of tetrahydrocannabinol (THC). Source of    THC is most commonly herbal marijuana or marijuana-based products,    but THC is also present in a scheduled prescription medication.    Trace amounts of THC can be present in hemp and cannabidiol (CBD)    products. This test is not intended to distinguish between delta-9-    tetrahydrocannabinol, the predominant form of THC in most herbal or    marijuana-based products, and delta-8-tetrahydrocannabinol.  Drug Absent but Declared for Prescription Verification   Salicylate                     Not Detected UNEXPECTED    Salicylate, as indicated in the declared medication list, is not    always detected even when used as directed.     Aspirin, as indicated in the declared medication list, is not always    detected even when used as directed.  ==================================================================== Test                      Result    Flag   Units      Ref Range   Creatinine              125              mg/dL      >=40 ==================================================================== Declared Medications:  The flagging and interpretation on this report are based on the  following declared medications.  Unexpected results may arise from  inaccuracies in the declared medications.   **Note: The testing scope of this panel does not include small to  moderate amounts of these reported medications:   Aspirin  Salicylate (Salicylamide)   **Note: The testing scope of this panel does not include the  following reported medications:   Caffeine  Fluticasone (Flonase)  Ondansetron (Zofran)  Ramelteon (Rozerem)  Ropinirole  (Requip)  Rosuvastatin (Crestor)  Sumatriptan ==================================================================== For clinical consultation, please call [phone]. ====================================================================       ROS  Constitutional: Denies any fever or chills Gastrointestinal: No reported hemesis, hematochezia, vomiting, or acute GI distress Musculoskeletal: Denies any acute onset joint swelling, redness, loss of ROM, or weakness Neurological: No reported episodes of acute onset apraxia, aphasia, dysarthria, agnosia, amnesia, paralysis, loss of coordination, or loss of consciousness  Medication Review  Aspirin-Caffeine, Fluticasone-Umeclidin-Vilant, SUMAtriptan, albuterol, budeson-glycopyrrolate-formoterol, budesonide-formoterol, fluticasone, gabapentin, nitroGLYCERIN, ondansetron, rOPINIRole, and rosuvastatin  History Review  Allergy: [name] is allergic to penicillin g, penicillins, and sulfa antibiotics. Drug: [name]  reports current drug use. Frequency: 7.00 times per week. Drug: Marijuana. Alcohol:  reports current alcohol use of about 42.0 standard drinks of alcohol per week. Tobacco:  reports that she has been smoking cigarettes, pipe, and cigars. She started smoking about 42 years ago. She has a 126.7 pack-year smoking history. She has never used smokeless tobacco. Social: [name]  reports that she has been smoking cigarettes, pipe, and cigars. She started smoking about 42 years ago. She has a 126.7 pack-year smoking history. She has never used smokeless tobacco. She reports current alcohol use of about 42.0  standard drinks of alcohol per week. She reports current drug use. Frequency: 7.00 times per week. Drug: Marijuana. Medical:  has a past medical history of Abnormal drug screen (01/08/2022 UDS) (02/26/2022), Abnormal MRI, cervical spine (01/23/2022) (02/26/2022), Abnormal MRI, lumbar spine (10/28/2021) (12/23/2021), Abnormal MRI, thoracic  spine (01/23/2022) (02/26/2022), Allergy, Angina pectoris (HCC) (01/07/2023), Aortic atherosclerosis (HCC) (01/14/2021), Arthritis, Asthma, Atherosclerotic heart disease of native coronary artery without angina pectoris, Bilateral stenosis of lateral recess of lumbar spine, Bipolar disorder (HCC), Carpal tunnel syndrome (Bilateral) (01/08/2022), Centrilobular emphysema (HCC) (02/21/2016), Cervical facet syndrome (01/08/2022), Cervical radiculopathy (08/01/2020), Cervicalgia (01/08/2022), Cervicogenic headache (01/08/2022), Chronic cough (03/16/2022), Chronic hip pain (2ry area of Pain) (Bilateral) (L>R) (01/08/2022), Chronic low back pain (1ry area of Pain) (Bilateral) (L>R) w/o sciatica (07/04/2021), Chronic lower extremity pain (3ry area of Pain) (Intermittent) (Left) (01/08/2022), Chronic neck and back pain (5th area of Pain) (Midline) (01/08/2022), Chronic obstructive pulmonary disease (HCC) (06/25/2011), Chronic pain syndrome (12/23/2021), Chronic sacroiliac joint pain (Bilateral) (02/26/2022), Chronic shoulder pain (6th area of Pain) (Bilateral) (01/08/2022), Chronic thoracic back pain (4th area of Pain) (Midline) (Bilateral) (01/08/2022), Cigarette smoker (05/01/2021), DDD (degenerative disc disease), cervical (12/23/2021), DDD (degenerative disc disease), lumbosacral (12/23/2021), DDD (degenerative disc disease), thoracic (12/23/2021), Depression, Disorder of skeletal system (12/23/2021), Elevated alkaline phosphatase level (02/26/2022), Emphysema of lung (HCC), Failure to attend appointment (10/20/2022), GERD (gastroesophageal reflux disease), Grade 1 Retrolisthesis of cervical region (2 mm) (C4/C5) (12/23/2021), Grade 1 Retrolisthesis of L2/L3 and L3/L4 (01/08/2022), Headache disorder (09/20/2020), History of ileus (02/19/2021), History of marijuana use (02/26/2022), Leg pain, Lumbar disc annular tears (01/08/2022), Lumbar facet syndrome (Bilateral) (01/08/2022), Lumbar lateral recess stenosis (L3-4) (Left)  (12/23/2021), Lumbar radiculopathy (08/01/2020), Lumbosacral facet arthropathy (Multilevel) (Bilateral) (12/23/2021), 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) (Bilateral: L5-S1) (12/23/2021), Lumbosacral lateral recess stenosis (L3-4, L5-S1) (Left) (01/08/2022), Marijuana use (02/26/2022), Migraines (07/02/2015), Mixed dyslipidemia (01/07/2023), Multiple pulmonary nodules determined by computed tomography of lung (05/01/2021), Nicotine dependence, cigarettes, w unsp disorders (06/25/2011), Numbness and tingling in hands (Bilateral) (01/08/2022), Occipital headache (Bilateral) (01/08/2022), Other specified chronic obstructive pulmonary disease (HCC) (06/25/2011), Other spondylosis, cervical region (C4/C5 and C5/C6) (12/23/2021), Pharmacologic therapy (12/23/2021), Problems influencing health status (12/23/2021), PTSD (post-traumatic stress disorder) (04/05/2020), Rectal bleeding (02/19/2021), RLS (restless legs syndrome) (07/02/2015), Spondylosis without myelopathy or radiculopathy, lumbosacral region (02/26/2022), Stress incontinence (11/01/2021), T12 compression fracture, sequela (08/01/2020), Thoracic facet syndrome (01/08/2022), and Upper airway cough syndrome (05/01/2021). Surgical: [name]  has a past surgical history that includes Appendectomy (1986); Total abdominal hysterectomy w/ bilateral salpingoophorectomy (2008); Colostomy revision (2014); Carpal tunnel release; breast surgey (Left); LEFT HEART CATH AND CORONARY ANGIOGRAPHY (N/A, 01/12/2023); Small intestine surgery; Breast surgery; Eye surgery; Tubal ligation; Colon surgery; and Abdominal hysterectomy. Family: family history includes Alcohol abuse in her brother, father, and mother; Anxiety disorder in her mother; Depression in her brother and mother; Early death in her brother and mother; Heart attack in her brother, father, maternal grandmother, and paternal grandmother; Heart disease in her brother and maternal grandmother; Seizures in her  daughter; Varicose Veins in her mother.  Laboratory Chemistry Profile   Renal Lab Results  Component Value Date   BUN 14 10/22/2023   CREATININE 0.85 10/22/2023   BCR 18 01/07/2023   GFR 76.16 10/22/2023   GFRAA >60 12/06/2015   GFRNONAA >60 12/06/2015    Hepatic Lab Results  Component Value Date   AST 21 10/22/2023   ALT 16 10/22/2023   ALBUMIN 4.3 10/22/2023   ALKPHOS 108 10/22/2023   LIPASE 24 12/06/2015    Electrolytes Lab  Results  Component Value Date   NA 138 10/22/2023   K 4.2 10/22/2023   CL 104 10/22/2023   CALCIUM 9.3 10/22/2023   MG 2.1 01/08/2022   PHOS 3.5 09/28/2023    Bone Lab Results  Component Value Date   25OHVITD1 35 01/08/2022   25OHVITD2 <1.0 01/08/2022   25OHVITD3 35 01/08/2022    Inflammation (CRP: Acute Phase) (ESR: Chronic Phase) Lab Results  Component Value Date   CRP <1.0 08/31/2023   ESRSEDRATE 18 08/31/2023         Note: Above Lab results reviewed.  Recent Imaging Review  DG Chest 2 View CLINICAL DATA:  Shortness of breath  EXAM: CHEST - 2 VIEW  COMPARISON:  CT chest December 01, 2023  FINDINGS: No acute infiltrates or consolidation with prominence of the interstitial markings that correlate with previously described chronic interstitial lung disease and centrilobular emphysema on prior CT.  Previously described pulmonary nodules and pleural nodularity is not identified on the plain films  Heart and mediastinum are normal.  No pleural effusions.  IMPRESSION: No active cardiopulmonary disease.  COPD with centrilobular emphysema  Electronically Signed   By: Shaaron Adler M.D.   On: 12/08/2023 13:23 CT CHEST WO CONTRAST CLINICAL DATA:  Follow-up lung nodules  EXAM: CT CHEST WITHOUT CONTRAST  TECHNIQUE: Multidetector CT imaging of the chest was performed following the standard protocol without IV contrast.  RADIATION DOSE REDUCTION: This exam was performed according to the departmental dose-optimization  program which includes automated exposure control, adjustment of the mA and/or kV according to patient size and/or use of iterative reconstruction technique.  COMPARISON:  CT chest July 17, 2021  FINDINGS: Cardiovascular: No significant vascular findings. Normal heart size. No pericardial effusion.  Mediastinum/Nodes: No enlarged mediastinal or axillary lymph nodes. Thyroid gland, trachea, and esophagus demonstrate no significant findings.  Lungs/Pleura: Prominence of the interstitial markings bilaterally with centrilobular and paraseptal emphysema.  Chronic fibro atelectatic changes in both upper lobes. Stable since prior examination 5 mm new nodule in the right upper lobe image 24 adjacent to the bulla. Image 24  No change left upper lobe left upper lobe pleural-based fibro atelectatic nodular changes 4.5 mm triangular nodular density in the right middle lobe image 85.  Compared with prior examination there seems to be some slight interval increase in the nodularity of both posterior pleura as seen in images 67 through 97. No significant pleural effusions. These findings are nonspecific however, close interval follow-up is recommended to assess stability of this nodularity. Interval six-month follow-up CT is recommended. Lung-RADS 3, probably benign findings. Short-term follow-up in 6 months is recommended with repeat low-dose chest CT without contrast (please use the following order, "CT CHEST LCS NODULE FOLLOW-UP W/O CM").  Upper Abdomen: No acute abnormality.  Old compression fracture  Musculoskeletal: Old compression fracture deformity superior endplate of T12 with a Schmorl's nodule unchanged since prior examination. No new findings.  IMPRESSION: Lung-RADS 3, probably benign findings. Stable pulmonary nodules however, slight increased nodularity along the posterior pleural reflections as described. Findings are nonspecific but close interval follow-up suggested.  Specially of the patient may have high prevalence or exposure to entities that are related to mesothelioma  Electronically Signed   By: Shaaron Adler M.D.   On: 12/08/2023 13:22 Note: Reviewed        Physical Exam  General appearance: Well nourished, well developed, and well hydrated. In no apparent acute distress Mental status: Alert, oriented x 3 (person, place, & time)  Respiratory: No evidence of acute respiratory distress Eyes: PERLA Vitals: There were no vitals taken for this visit. BMI: Estimated body mass index is 31.64 kg/m as calculated from the following:   Height as of 11/09/23: 5\' 7"  (1.702 m).   Weight as of 11/09/23: 202 lb (91.6 kg). Ideal: Patient weight not recorded  Assessment   Diagnosis Status  No diagnosis found. Controlled Controlled Controlled   Updated Problems: No problems updated.  Plan of Care  Problem-specific:  Assessment and Plan            [name] has a current medication list which includes the following long-term medication(s): albuterol, budesonide-formoterol, fluticasone, gabapentin, nitroglycerin, ropinirole, rosuvastatin, and sumatriptan.  Pharmacotherapy (Medications Ordered): No orders of the defined types were placed in this encounter.  Orders:  No orders of the defined types were placed in this encounter.  Follow-up plan:   No follow-ups on file.      Interventional Therapies  Risk  Complexity Considerations:   Estimated body mass index is 34.06 kg/m as calculated from the following:   Height as of this encounter: 5\' 6"  (1.676 m).   Weight as of this encounter: 211 lb (95.7 kg). WNL      Planned  Pending:      Under consideration:   Diagnostic bilateral lumbar facet RFA #1  Diagnostic left L4-5 LESI #1  Diagnostic left L5 and S1 TFESI #1  Diagnostic bilateral SI joint Blk #1  Diagnostic bilateral IA hip joint inj. #1  Diagnostic bilateral thoracic facet MBB #1  Diagnostic bilateral  cervical facet MBB #1  Diagnostic bilateral IA shoulder joint inj. #1  Diagnostic bilateral carpal tunnel syndrome inj. #1   Completed:   Diagnostic bilateral lumbar facet MBB x2 (06/10/2022) (1st: 100/100/100/100) (2nd: 100/100/100/40/65)    Completed by other providers:   Nerve block by [facility] in [facility]   Therapeutic  Palliative (PRN) options:   None established      Recent Visits No visits were found meeting these conditions. Showing recent visits within past 90 days and meeting all other requirements Future Appointments Date Type Provider Dept  12/16/23 Appointment Delano Metz, MD [facility]  Showing future appointments within next 90 days and meeting all other requirements  I discussed the assessment and treatment plan with the patient. The patient was provided an opportunity to ask questions and all were answered. The patient agreed with the plan and demonstrated an understanding of the instructions.  Patient advised to call back or seek an in-person evaluation if the symptoms or condition worsens.  Duration of encounter: *** minutes.  Total time on encounter, as per AMA guidelines included both the face-to-face and non-face-to-face time personally spent by the physician and/or other qualified health care professional(s) on the day of the encounter (includes time in activities that require the physician or other qualified health care professional and does not include time in activities normally performed by clinical staff). Physician's time may include the following activities when performed: Preparing to see the patient (e.g., pre-charting review of records, searching for previously ordered imaging, lab work, and nerve conduction tests) Review of prior analgesic pharmacotherapies. Reviewing PMP Interpreting ordered tests (e.g., lab work, imaging, nerve conduction tests) Performing post-procedure evaluations, including interpretation of diagnostic  procedures Obtaining and/or reviewing separately obtained history Performing a medically appropriate examination and/or evaluation Counseling and educating the patient/family/caregiver Ordering medications, tests, or procedures Referring and communicating with other health care professionals (when not separately reported) Documenting clinical information  in the electronic or other health record Independently interpreting results (not separately reported) and communicating results to the patient/ family/caregiver Care coordination (not separately reported)  Note by: Oswaldo Done, MD Date: 12/16/2023; Time: 6:05 AM

## 2023-12-16 ENCOUNTER — Ambulatory Visit (HOSPITAL_BASED_OUTPATIENT_CLINIC_OR_DEPARTMENT_OTHER): Admitting: Pain Medicine

## 2023-12-16 DIAGNOSIS — Z91199 Patient's noncompliance with other medical treatment and regimen due to unspecified reason: Secondary | ICD-10-CM

## 2023-12-16 DIAGNOSIS — G8929 Other chronic pain: Secondary | ICD-10-CM

## 2023-12-18 ENCOUNTER — Other Ambulatory Visit: Payer: Self-pay | Admitting: Nurse Practitioner

## 2023-12-18 DIAGNOSIS — G43009 Migraine without aura, not intractable, without status migrainosus: Secondary | ICD-10-CM

## 2023-12-22 ENCOUNTER — Encounter: Payer: Self-pay | Admitting: Nurse Practitioner

## 2023-12-23 ENCOUNTER — Encounter: Payer: Self-pay | Admitting: Psychiatry

## 2023-12-23 ENCOUNTER — Ambulatory Visit (INDEPENDENT_AMBULATORY_CARE_PROVIDER_SITE_OTHER): Payer: Self-pay | Admitting: Psychiatry

## 2023-12-23 VITALS — BP 110/76 | HR 80 | Temp 98.5°F | Ht 67.0 in | Wt 197.6 lb

## 2023-12-23 DIAGNOSIS — F5105 Insomnia due to other mental disorder: Secondary | ICD-10-CM | POA: Diagnosis not present

## 2023-12-23 DIAGNOSIS — F99 Mental disorder, not otherwise specified: Secondary | ICD-10-CM

## 2023-12-23 DIAGNOSIS — F4310 Post-traumatic stress disorder, unspecified: Secondary | ICD-10-CM | POA: Diagnosis not present

## 2023-12-23 DIAGNOSIS — F332 Major depressive disorder, recurrent severe without psychotic features: Secondary | ICD-10-CM

## 2023-12-23 MED ORDER — ESZOPICLONE 1 MG PO TABS: 1.0000 mg | ORAL_TABLET | Freq: Every evening | ORAL | 0 refills | Status: DC | PRN

## 2023-12-23 NOTE — Progress Notes (Addendum)
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 uses as well as side effects and adverse reaction.  Patient educated on suicidal risk as well as having a firearm within the home to have a secured with ammunition separated as well as to call 911 or go to emergency department should she have suicidal thoughts with or without a plan. - Follow-Up: Follow-up in 1 month - Referrals: No referrals - Safety Planning:  The patient has been educated, if  they should have suicidal thoughts with or without a plan to call 911, or go to the closest emergency department.  Pt verbalized understanding.  Pt endorses having a firearm in her RV which she lives in for safety due to being single and living in an RV by herself with her dog.  Patient agrees to keep ammunition and firearm separated.  Pt also agrees to call the clinic should they have worsening symptoms before the next appointment.      Patient/Guardian was advised Release of Information must be obtained prior to any record release in order to collaborate their care with an outside provider. Patient/Guardian was advised if they have not already done so to contact the registration department to sign all necessary forms in order for [place] to release information regarding their care.   Consent: Patient/Guardian gives verbal consent for treatment and assignment of benefits for services provided during this visit. Patient/Guardian expressed understanding and agreed to proceed.   Juliann Pares, NP 4/9/20259:04 AM

## 2023-12-24 ENCOUNTER — Other Ambulatory Visit: Payer: Self-pay | Admitting: Nurse Practitioner

## 2023-12-24 DIAGNOSIS — G43009 Migraine without aura, not intractable, without status migrainosus: Secondary | ICD-10-CM

## 2023-12-25 ENCOUNTER — Other Ambulatory Visit: Payer: Self-pay

## 2023-12-25 DIAGNOSIS — G4700 Insomnia, unspecified: Secondary | ICD-10-CM

## 2023-12-25 DIAGNOSIS — J432 Centrilobular emphysema: Secondary | ICD-10-CM

## 2023-12-25 DIAGNOSIS — G2581 Restless legs syndrome: Secondary | ICD-10-CM

## 2023-12-25 DIAGNOSIS — G894 Chronic pain syndrome: Secondary | ICD-10-CM

## 2023-12-25 DIAGNOSIS — G43009 Migraine without aura, not intractable, without status migrainosus: Secondary | ICD-10-CM

## 2023-12-25 DIAGNOSIS — E782 Mixed hyperlipidemia: Secondary | ICD-10-CM

## 2023-12-25 MED ORDER — ALBUTEROL SULFATE (2.5 MG/3ML) 0.083% IN NEBU: 2.5000 mg | INHALATION_SOLUTION | Freq: Four times a day (QID) | RESPIRATORY_TRACT | 0 refills | Status: DC | PRN

## 2023-12-25 MED ORDER — ROPINIROLE HCL 4 MG PO TABS
4.0000 mg | ORAL_TABLET | Freq: Every day | ORAL | 0 refills | Status: DC
Start: 2023-12-25 — End: 2024-04-15

## 2023-12-25 MED ORDER — ROSUVASTATIN CALCIUM 5 MG PO TABS: 5.0000 mg | ORAL_TABLET | Freq: Every day | ORAL | 0 refills | Status: DC

## 2023-12-25 MED ORDER — GABAPENTIN 300 MG PO CAPS
300.0000 mg | ORAL_CAPSULE | Freq: Every day | ORAL | 0 refills | Status: DC
Start: 2023-12-25 — End: 2024-06-30

## 2023-12-28 ENCOUNTER — Encounter: Payer: Self-pay | Admitting: Nurse Practitioner

## 2023-12-29 NOTE — Telephone Encounter (Signed)
 We find it necessary to inform you that [facility] and All [place] [facility] /Providers will no longer be able to provide medical care to you.  We believe that our patient/provider relationship has been compromised and thus would request that you seek care elsewhere.12/28/23

## 2024-01-05 ENCOUNTER — Ambulatory Visit: Admitting: Psychiatry

## 2024-01-06 ENCOUNTER — Telehealth: Payer: Self-pay | Admitting: Psychiatry

## 2024-01-06 NOTE — Telephone Encounter (Signed)
 PT called asking if her follow up can be changed to virtual due to not wanting to be on the road to drive here. She states she received some news that has made her very angry and doesn't want road rage to get too bad. I went ahead and changed her appt to virtual.

## 2024-01-07 ENCOUNTER — Telehealth (INDEPENDENT_AMBULATORY_CARE_PROVIDER_SITE_OTHER): Admitting: Psychiatry

## 2024-01-07 DIAGNOSIS — F4310 Post-traumatic stress disorder, unspecified: Secondary | ICD-10-CM | POA: Diagnosis not present

## 2024-01-07 DIAGNOSIS — F5105 Insomnia due to other mental disorder: Secondary | ICD-10-CM

## 2024-01-07 DIAGNOSIS — F3181 Bipolar II disorder: Secondary | ICD-10-CM | POA: Diagnosis not present

## 2024-01-07 MED ORDER — CHLORPROMAZINE HCL 50 MG PO TABS: 50.0000 mg | ORAL_TABLET | Freq: Three times a day (TID) | ORAL | 0 refills | Status: DC | PRN

## 2024-01-07 MED ORDER — ARIPIPRAZOLE 5 MG PO TABS: 5.0000 mg | ORAL_TABLET | Freq: Every day | ORAL | 0 refills | Status: DC

## 2024-01-07 NOTE — Progress Notes (Addendum)
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 initial symptoms of dizziness, GI irritability, headaches in which she was notified to contact clinic should last longer than a week. - Psychotherapy: Declined local therapy referral and requested to be placed on wait list for a therapist within this office. - Education: Patient educated on therapy and the impact and effects of PTSD with therapy in which she is in agreement.  Patient reports she like to be to see a therapist within his  office.  Patient educated on the medication Lunesta  of its uses as well as side effects and adverse reaction.  Patient educated on suicidal risk as well as having a firearm within the home to have a secured with ammunition separated as well as to call 911 or go to emergency department should she have suicidal thoughts with or without a plan. - Follow-Up: Follow-up in 1 month - Referrals: No referrals - Safety Planning:  The patient has been educated, if they should have suicidal thoughts with or without a plan to call 911, or go to the closest emergency department.  Pt verbalized understanding.  Pt endorses having a firearm in her RV which she lives in for safety due to being single and living in an RV by herself with her dog.  Patient agrees to keep ammunition and firearm separated.  Pt also agrees to call the clinic should they have worsening symptoms before the next appointment.    Collaboration of Care:  Patient/Guardian was advised Release of Information must be obtained prior to any record release in order to collaborate their care with an outside provider. Patient/Guardian was advised if they have not already done so to contact the registration department to sign all necessary forms in order for us  to release information regarding their care.   Consent: Patient/Guardian gives verbal consent for treatment and assignment of benefits for services provided during this visit. Patient/Guardian expressed understanding and agreed to proceed.   This office note has been dictated. This dictation was prepared using Air traffic controller. As a result, errors may occur. When identified, these errors have been corrected. While every attempt is made to correct errors during dictation, errors may still exist.    [name], NP 01/07/2024, 9:19 AM

## 2024-01-18 ENCOUNTER — Other Ambulatory Visit: Payer: Self-pay | Admitting: Cardiology

## 2024-01-18 DIAGNOSIS — I209 Angina pectoris, unspecified: Secondary | ICD-10-CM

## 2024-01-22 ENCOUNTER — Telehealth: Payer: Self-pay

## 2024-01-22 ENCOUNTER — Telehealth (INDEPENDENT_AMBULATORY_CARE_PROVIDER_SITE_OTHER): Admitting: Psychiatry

## 2024-01-22 DIAGNOSIS — F3181 Bipolar II disorder: Secondary | ICD-10-CM

## 2024-01-22 DIAGNOSIS — F99 Mental disorder, not otherwise specified: Secondary | ICD-10-CM | POA: Diagnosis not present

## 2024-01-22 DIAGNOSIS — F4310 Post-traumatic stress disorder, unspecified: Secondary | ICD-10-CM | POA: Diagnosis not present

## 2024-01-22 DIAGNOSIS — F5105 Insomnia due to other mental disorder: Secondary | ICD-10-CM

## 2024-01-22 MED ORDER — LAMOTRIGINE 25 MG PO TABS: 50.0000 mg | ORAL_TABLET | Freq: Every day | ORAL | 1 refills | Status: DC

## 2024-01-22 MED ORDER — CHLORPROMAZINE HCL 50 MG PO TABS: 50.0000 mg | ORAL_TABLET | Freq: Every day | ORAL | 1 refills | Status: DC

## 2024-01-22 NOTE — Progress Notes (Signed)
 BH MD/PA/NP OP Progress Note  01/22/2024 7:59 AM Kim [name]  MRN:  [id number]  Chief Complaint: " I woke up at 1am last night and couldn't go back to sleep due to the nightmare" Virtual Visit via Video Note  I connected with Kim [name] on 01/22/24 at  8:00 AM EDT by a video enabled telemedicine application and verified that I am speaking with the correct person using two identifiers.  Location: Patient: [name]  [place] [address]  Provider: [facility]   I discussed the limitations of evaluation and management by telemedicine and the availability of in person appointments. The patient expressed understanding and agreed to proceed.    I discussed the assessment and treatment plan with the patient. The patient was provided an opportunity to ask questions and all were answered. The patient agreed with the plan and demonstrated an understanding of the instructions.   The patient was advised to call back or seek an in-person evaluation if the symptoms worsen or if the condition fails to improve as anticipated.  I provided 30 minutes of non-face-to-face time during this encounter.   [name], NP    HPI: 58 year old female presents [facility] for a follow-up.  Patient reports that she is still having problems with sleeping reporting that she is unable to stay asleep as she woke up at 1 AM last night due to nightmares that has been recurring over a best friend passing away.  Patient also reports that Thorazine  was not taken over the last month due to confusion on the medications on when to take them.  She did report while she was on therapy she had an instance of taking a CBD Gummies which made her feel intoxicated like alcohol for nearly 2 days in which she stated that she "did not enjoy at all, and will never do again".  Patient also reports that Abilify  was not a good option as weight gain was of common side effect of the medication.  Patient appears to be  tired and is tearful about her nightmare that she had to cause her to wake up.    Patient on this assessment interview is recommended for the patient to change medications from Abilify  to Lamictal 50 mg once a day, please take 2 capsules in the morning daily for bipolar symptoms.  Patient will also take Thorazine  1 tablet, once daily before bed to promote sleep.  Patient continues to wait for therapy to become available at the [facility] office.  Patient is encouraged to continue to seek a primary care specialist while recommending Dr. Ziglar with [facility] [place].  Due to medication monitoring and management is recommended for the patient to follow weekly, with follow-up next week.  Visit Diagnosis:    ICD-10-CM   1. PTSD (post-traumatic stress disorder)  F43.10 chlorproMAZINE  (THORAZINE ) 50 MG tablet    2. Insomnia due to other mental disorder  F51.05 chlorproMAZINE  (THORAZINE ) 50 MG tablet   F99     3. Bipolar 2 disorder (HCC)  F31.81 lamoTRIgine (LAMICTAL) 25 MG tablet      Past Psychiatric History:  Previous Psych Hospitalizations: Last hospitalization on 2004 after the discovery of her husband cheating on her with her biological daughter.  Patient endorses being hospitalized 14 times since adolescence.   Psychiatric diagnosis history: Bipolar, paranoid schizophrenia, PTSD, major depressive disorder, anxiety.   Medications Current: -Gabapentin  -Thorazine  50mg  once daily at night -Lamictal 50mg  once a day   Medication Trials: -  01/07/24 Lunesta , poor response and side effects of "arm and leg jerking" - 45/9/25 Abilify , pt weight gain -Duloxetine - caused a colostomy due to gastric complications. -Trazodone-states significant side effects while taking therapeutic dose and stopped. -Patient reports many medications have been tried and failed but unable to recall patient states that if mentioned will notify if tried or not. Suicide & Violence: Patient currently denies SI, HI.   Paper should reports having a firearm in the home but has no thoughts of plan to harm herself.   Psychotherapy: Patient is open to therapy but will not seek therapy in the local community and is requesting to see [facility] therapist.  In placed on wait list.   Legal: Denies  Past Medical History:  Past Medical History:  Diagnosis Date   Abnormal drug screen (01/08/2022 UDS) 02/26/2022   (01/08/2022) abnormal UDS (+) for carboxy-THC (marijuana)    Abnormal MRI, cervical spine (01/23/2022) 02/26/2022   (01/23/2022) MRI CERVICAL SPINE FINDINGS:  Alignment: Mild reversal the normal cervical lordosis.     DISC LEVELS:  C3-4: Mild bilateral facet arthropathy  C4-5: Minimal disc bulge and mild bilateral facet arthropathy     IMPRESSION:  1. Mild multilevel degenerative change without significant canal or foraminal stenosis.   Abnormal MRI, lumbar spine (10/28/2021) 12/23/2021   (10/28/2021) LUMBAR MRI FINDINGS:  Alignment: Is trace retrolisthesis of L2 on L3 and L3 on L4, not significantly changed. Alignment is otherwise normal.  Vertebrae: There is anterior compression deformity of the T12 vertebral body with up to approximately 25% loss of vertebral body height anteriorly, not significantly changed since 2021. The other vertebral body heights are preserved. There is mi   Abnormal MRI, thoracic spine (01/23/2022) 02/26/2022   (01/23/2022) MRI THORACIC SPINE FINDINGS:  Alignment: Mildly exaggerated thoracic kyphosis at T11-T12 due to T12 fracture described below.  Vertebrae: Remote T12 compression fracture, unchanged from 10/28/2021 MRI      DISC LEVELS:  Mild disc bulge at T11-12      IMPRESSION:  1. Mild multilevel degenerative change without significant canal or foraminal stenosis.  2. Remote T12 fracture with similar    Allergy    Angina pectoris (HCC) 01/07/2023   Aortic atherosclerosis (HCC) 01/14/2021   Arthritis    Asthma    Atherosclerotic heart disease of native coronary artery without angina pectoris     Bilateral stenosis of lateral recess of lumbar spine    Bipolar disorder (HCC)    Carpal tunnel syndrome (Bilateral) 01/08/2022   Centrilobular emphysema (HCC) 02/21/2016   Formatting of this note might be different from the original.  Last Assessment & Plan:   Formatting of this note might be different from the original.  Ongoing. Not well controlled.  Saw Pulmonology whom she felt didn't help her cough.  Requesting new referral to see a different Pulmonologist.  Referral was placed today during the visit.   Cervical facet syndrome 01/08/2022   Cervical radiculopathy 08/01/2020   Cervicalgia 01/08/2022   Cervicogenic headache 01/08/2022   Chronic cough 03/16/2022   Chronic hip pain (2ry area of Pain) (Bilateral) (L>R) 01/08/2022   Chronic low back pain (1ry area of Pain) (Bilateral) (L>R) w/o sciatica 07/04/2021   Chronic lower extremity pain (3ry area of Pain) (Intermittent) (Left) 01/08/2022   Chronic neck and back pain (5th area of Pain) (Midline) 01/08/2022   Chronic obstructive pulmonary disease (HCC) 06/25/2011   Active smoker  - 04/30/2021  After extensive coaching inhaler device,  effectiveness =    90%  with smi > resume stiolto     Formatting of this note might be different from the original.  Formatting of this note might be different from the original.  Active smoker  - 04/30/2021  After extensive coaching inhaler device,  effectiveness =    90% with smi > resume stiolto     Last Assessment & Plan:   Fo   Chronic pain syndrome 12/23/2021   Chronic sacroiliac joint pain (Bilateral) 02/26/2022   Chronic shoulder pain (6th area of Pain) (Bilateral) 01/08/2022   Chronic thoracic back pain (4th area of Pain) (Midline) (Bilateral) 01/08/2022   Cigarette smoker 05/01/2021   DDD (degenerative disc disease), cervical 12/23/2021   DDD (degenerative disc disease), lumbosacral 12/23/2021   (10/28/2021) LUMBAR MRI FINDINGS:  LEVELS:  There is disc desiccation without significant loss of height  at L3-L4 and L4-L5. There is disc desiccation and mild narrowing at L1-L2, L2-L3, and L5-S1.  L1-2: central protrusion  L2-3: disc bulge  L3-4: disc bulge with a left foraminal component and annular fissure  L4-5: diffuse disc bulge with a central annular fissure  L5-S1: diffuse disc bulge with   DDD (degenerative disc disease), thoracic 12/23/2021   Depression    Disorder of skeletal system 12/23/2021   Elevated alkaline phosphatase level 02/26/2022   (01/08/2022)- Normal ALP (Alkaline phosphatase) levels are between 44 -121 IU/L, for our Lab. High ALP could suggest liver damage or increased bone cell activity. If other tests such as bilirubin, aspartate aminotransferase (AST), or alanine aminotransferase (ALT) are also high, usually the increased ALP is coming from the liver. Higher-than-normal ALP levels can be seen with: biliary obstruction;    Emphysema of lung (HCC)    Failure to attend appointment 10/20/2022   GERD (gastroesophageal reflux disease)    Grade 1 Retrolisthesis of cervical region (2 mm) (C4/C5) 12/23/2021   Grade 1 Retrolisthesis of L2/L3 and L3/L4 01/08/2022   (10/28/2021) LUMBAR MRI FINDINGS:  Alignment: Is trace retrolisthesis of L2 on L3 and L3 on L4.  LEVELS:  L2-3: There is a mild disc bulge and mild facet arthropathy  L3-4: There is a mild disc bulge with a left foraminal component and annular fissure and mild left worse than right facet arthropathy resulting in narrowing of the left subarticular zone with crowding of the traversing nerve root, an   Headache disorder 09/20/2020   History of ileus 02/19/2021   History of marijuana use 02/26/2022   (01/08/2022) abnormal UDS (+) for carboxy-THC (marijuana)   The electronic medical record also indicated positive results on 10/09/2012 and 11/25/2013.   Leg pain    bilateral   Lumbar disc annular tears 01/08/2022   (10/28/2021) LUMBAR MRI FINDINGS:  LEVELS:  L3-4: There is a mild disc bulge with a left foraminal component and  annular fissure  L4-5: There is a diffuse disc bulge with a central annular fissure   Lumbar facet syndrome (Bilateral) 01/08/2022   Lumbar lateral recess stenosis (L3-4) (Left) 12/23/2021   Lumbar radiculopathy 08/01/2020   Lumbosacral facet arthropathy (Multilevel) (Bilateral) 12/23/2021   (10/28/2021) LUMBAR MRI FINDINGS:  LEVELS:  L2-3: mild facet arthropathy  L3-4: mild left worse than right facet arthropathy.  L4-5: bilateral facet arthropathy  L5-S1: bilateral facet arthropathy. There are trace bilateral facet joint effusions with mild perifacetal soft tissue edema at this level which could reflect a source of pain.     There is facet arthropathy most advanced at L4-L5 and L5-S   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) (Bilateral: L5-S1)  12/23/2021   (10/28/2021) LUMBAR MRI FINDINGS:  LEVELS:  L3-4: mild disc bulge with a left foraminal component resulting in mild left and no significant right neural foraminal stenosis. There is possible contact of the exiting left L3 nerve root by foraminal disc material.  L5-S1: diffuse disc bulge with bilateral foraminal components resulting in mild-to-moderate bilateral neural foraminal stenosis.   Lumbosacral lateral recess stenosis (L3-4, L5-S1) (Left) 01/08/2022   (10/28/2021) LUMBAR MRI FINDINGS:  LEVELS:  L3-4: narrowing of the left subarticular zone. There is possible contact of the exiting left L3 nerve root by foraminal disc material.  L5-S1: mild crowding of the left subarticular zone   Marijuana use 02/26/2022   (01/08/2022) abnormal UDS (+) for carboxy-THC (marijuana)    Migraines 07/02/2015   Mixed dyslipidemia 01/07/2023   Multiple pulmonary nodules determined by computed tomography of lung 05/01/2021   Active smoker  03/07/21 (vs 12/04/20) new subpleural  area of airspace consolidation within the anterior basal right upper  lobe abutting the major fissure. This area has a mean derived  - CT 07/17/2021 lesions have either resolved or now change  c/w inflammatory etiology plus emphysema  > repeat 18 mplaced  in reminder file      Formatting of this note might be different from the original.  Formattin   Nicotine dependence, cigarettes, w unsp disorders 06/25/2011   Numbness and tingling in hands (Bilateral) 01/08/2022   Occipital headache (Bilateral) 01/08/2022   Other specified chronic obstructive pulmonary disease (HCC) 06/25/2011   Other spondylosis, cervical region (C4/C5 and C5/C6) 12/23/2021   Pharmacologic therapy 12/23/2021   Problems influencing health status 12/23/2021   PTSD (post-traumatic stress disorder) 04/05/2020   Rectal bleeding 02/19/2021   RLS (restless legs syndrome) 07/02/2015   Spondylosis without myelopathy or radiculopathy, lumbosacral region 02/26/2022   Stress incontinence 11/01/2021   T12 compression fracture, sequela 08/01/2020   Thoracic facet syndrome 01/08/2022   Upper airway cough syndrome 05/01/2021   Onset 2021   - cyclical cough rx  04/30/2021 >>>        Past Surgical History:  Procedure Laterality Date   ABDOMINAL HYSTERECTOMY     APPENDECTOMY  1986   BREAST SURGERY     breast surgey Left    Cyst Excision   CARPAL TUNNEL RELEASE     COLON SURGERY     COLOSTOMY REVISION  2014   EYE SURGERY     LEFT HEART CATH AND CORONARY ANGIOGRAPHY N/A 01/12/2023   Procedure: LEFT HEART CATH AND CORONARY ANGIOGRAPHY;  Surgeon: Avanell Leigh, MD;  Location: [facility];  Service: Cardiovascular;  Laterality: N/A;   SMALL INTESTINE SURGERY     TOTAL ABDOMINAL HYSTERECTOMY W/ BILATERAL SALPINGOOPHORECTOMY  2008   endometriosis   TUBAL LIGATION      Family Psychiatric History: Her mother has been hospitalized and attempted suicide in the past.   Family History:  Family History  Problem Relation Age of Onset   Alcohol abuse Mother    Anxiety disorder Mother    Depression Mother    Early death Mother    Varicose Veins Mother    Alcohol abuse Father    Heart attack Father    Heart  disease Brother    Heart attack Brother    Seizures Daughter    Heart attack Maternal Grandmother    Heart disease Maternal Grandmother    Heart attack Paternal Grandmother    Alcohol abuse Brother    Depression Brother    Early death  Brother     Social History:  Social History   Socioeconomic History   Marital status: Divorced    Spouse name: Not on file   Number of children: 1   Years of education: HS   Highest education level: Some college, no degree  Occupational History   Occupation: Disabled  Tobacco Use   Smoking status: Every Day    Current packs/day: 3.00    Average packs/day: 3.0 packs/day for 42.4 years (127.1 ttl pk-yrs)    Types: Cigarettes, Pipe, Cigars    Start date: 1983   Smokeless tobacco: Never   Tobacco comments:    3.5-4pdd 04/30/2021  Vaping Use   Vaping status: Former  Substance and Sexual Activity   Alcohol use: Yes    Alcohol/week: 42.0 standard drinks of alcohol    Types: 42 Cans of beer per week   Drug use: Yes    Frequency: 7.0 times per week    Types: Marijuana   Sexual activity: Not Currently    Birth control/protection: Other-see comments    Comment: Menopausal Hysterectomy 2008  Other Topics Concern   Not on file  Social History Narrative   Lives at home alone.   Drinks two pots of coffee per day.   Right-handed.   Social Drivers of Corporate investment banker Strain: Low Risk  (08/05/2023)   Overall Financial Resource Strain (CARDIA)    Difficulty of Paying Living Expenses: Not hard at all  Recent Concern: Financial Resource Strain - High Risk (06/23/2023)   Overall Financial Resource Strain (CARDIA)    Difficulty of Paying Living Expenses: Very hard  Food Insecurity: No Food Insecurity (08/05/2023)   Hunger Vital Sign    Worried About Running Out of Food in the Last Year: Never true    Ran Out of Food in the Last Year: Never true  Transportation Needs: No Transportation Needs (08/05/2023)   PRAPARE - Therapist, art (Medical): No    Lack of Transportation (Non-Medical): No  Physical Activity: Sufficiently Active (08/05/2023)   Exercise Vital Sign    Days of Exercise per Week: 7 days    Minutes of Exercise per Session: 150+ min  Stress: Stress Concern Present (08/05/2023)   [employer] of Occupational Health - Occupational Stress Questionnaire    Feeling of Stress : Rather much  Social Connections: Socially Isolated (08/27/2023)   Social Connection and Isolation Panel [NHANES]    Frequency of Communication with Friends and Family: Once a week    Frequency of Social Gatherings with Friends and Family: Never    Attends Religious Services: Never    Database administrator or Organizations: No    Attends Engineer, structural: Not on file    Marital Status: Divorced    Allergies:  Allergies  Allergen Reactions   Penicillin G     Other Reaction(s): Not available, Not available  Other Reaction(s): Not available   Penicillins Other (See Comments)    Unknown childhood reaction   Sulfa Antibiotics Swelling    Metabolic Disorder Labs: Lab Results  Component Value Date   HGBA1C 6.1 08/31/2023   No results found for: "PROLACTIN" Lab Results  Component Value Date   CHOL 186 08/31/2023   TRIG 99.0 08/31/2023   HDL 71.10 08/31/2023   CHOLHDL 3 08/31/2023   VLDL 19.8 08/31/2023   LDLCALC 95 08/31/2023   LDLCALC 95 11/01/2021   Lab Results  Component Value Date   TSH 3.42 08/31/2023  TSH 1.130 02/19/2021    Therapeutic Level Labs: No results found for: "LITHIUM" No results found for: "VALPROATE" No results found for: "CBMZ"  Current Medications: Current Outpatient Medications  Medication Sig Dispense Refill   albuterol  (PROVENTIL ) (2.5 MG/3ML) 0.083% nebulizer solution Take 3 mLs (2.5 mg total) by nebulization every 6 (six) hours as needed for wheezing or shortness of breath. 3 mL 0   ARIPiprazole  (ABILIFY ) 5 MG tablet Take 1 tablet (5 mg total) by  mouth daily. 30 tablet 0   Aspirin -Caffeine 845-65 MG PACK Take 6-7 each by mouth daily as needed (migraines). BC/ goodie powder 56 each    Budeson-Glycopyrrol-Formoterol  (BREZTRI  AEROSPHERE) 160-9-4.8 MCG/ACT AERO Inhale 2 puffs into the lungs in the morning and at bedtime. 3 each 3   budesonide -formoterol  (SYMBICORT ) 160-4.5 MCG/ACT inhaler Inhale 2 puffs into the lungs in the morning and at bedtime. 1 each 12   chlorproMAZINE  (THORAZINE ) 50 MG tablet Take 1 tablet (50 mg total) by mouth 3 (three) times daily as needed. 30 tablet 0   fluticasone  (FLONASE ) 50 MCG/ACT nasal spray shake liquid AND INSTILL 1 SPRAY IN EACH NOSTRIL DAILY 16 g 2   Fluticasone -Umeclidin-Vilant (TRELEGY ELLIPTA ) 100-62.5-25 MCG/ACT AEPB Inhale 1 puff into the lungs daily. 3 each 3   Fluticasone -Umeclidin-Vilant (TRELEGY ELLIPTA ) 100-62.5-25 MCG/ACT AEPB Inhale 1 puff into the lungs daily. 28 each 0   gabapentin  (NEURONTIN ) 300 MG capsule Take 1 capsule (300 mg total) by mouth at bedtime. 30 capsule 0   nitroGLYCERIN  (NITROSTAT ) 0.4 MG SL tablet Place 1 tablet (0.4 mg total) under the tongue every 5 (five) minutes as needed for chest pain. Patient needs appointment for further refills. 1 st attempt 25 tablet 0   ondansetron  (ZOFRAN -ODT) 8 MG disintegrating tablet DISSOLVE 1 TABLET(8 MG) ON THE TONGUE EVERY 8 HOURS AS NEEDED FOR NAUSEA OR VOMITING 90 tablet 3   rOPINIRole  (REQUIP ) 4 MG tablet Take 1 tablet (4 mg total) by mouth 5 (five) times daily. 150 tablet 0   rosuvastatin  (CRESTOR ) 5 MG tablet Take 1 tablet (5 mg total) by mouth daily. 30 tablet 0   SUMAtriptan  (IMITREX ) 5 MG/ACT nasal spray Place one spray intranasally at onset of migraine. May repeat dose x 1 after 2 hours if headache persists. Max 40 mg/24 hours. 1 each 5   No current facility-administered medications for this visit.     Musculoskeletal: Strength & Muscle Tone: within normal limits Gait & Station: normal Patient leans: N/A  Psychiatric  Specialty Exam: Review of Systems  Constitutional: Negative.   HENT: Negative.    Eyes: Negative.   Respiratory: Negative.    Cardiovascular: Negative.   Gastrointestinal: Negative.   Endocrine: Negative.   Genitourinary: Negative.   Musculoskeletal:  Positive for myalgias.  Skin: Negative.   Allergic/Immunologic: Negative.   Neurological: Negative.   Hematological: Negative.   Psychiatric/Behavioral:  Positive for behavioral problems, decreased concentration and dysphoric mood. The patient is nervous/anxious.     There were no vitals taken for this visit.There is no height or weight on file to calculate BMI.  General Appearance: Well Groomed  Eye Contact:  Good  Speech:  Clear and Coherent  Volume:  Normal  Mood:  Anxious and Depressed  Affect:  Depressed  Thought Process:  Coherent  Orientation:  Full (Time, Place, and Person)  Thought Content: WDL   Suicidal Thoughts:  No  Homicidal Thoughts:  No  Memory:  Immediate;   Good Recent;   Good Remote;   Good  Judgement:  Good  Insight:  Good  Psychomotor Activity:  Normal  Concentration:  Concentration: Good and Attention Span: Good  Recall:  Good  Fund of Knowledge: Good  Language: Good  Akathisia:  No  Handed:  Right  AIMS (if indicated):   Assets:  Financial Resources/Insurance Housing  ADL's:  Intact  Cognition: WNL  Sleep:  Poor   Screenings: AUDIT    Flowsheet Row Office Visit from 08/07/2023 in [facility] [employer] Most recent reading at 08/05/2023 10:43 AM Office Visit from 06/23/2023 in [facility] [employer] Most recent reading at 06/23/2023  5:01 AM Appointment from 07/23/2023 in [facility] [employer] Most recent reading at 06/04/2023  5:03 AM Appointment from 06/09/2023 in [facility] [place] Most recent reading at 06/04/2023  5:03 AM  Alcohol Use Disorder Identification Test Final  Score (AUDIT) 14  12  12  12        GAD-7    Flowsheet Row Office Visit from 08/07/2023 in [facility] [employer] Visit from 06/23/2023 in [facility] [employer] Visit from 02/06/2022 in [facility] Office Visit from 11/01/2021 in [facility] Office Visit from 01/14/2021 in [facility]  Total GAD-7 Score 18 6 18 18  0      PHQ2-9    Flowsheet Row Office Visit from 12/23/2023 in [facility] Office Visit from 08/07/2023 in [facility] Visit from 06/23/2023 in [facility] [employer] Visit from 02/26/2022 in [facility] Visit from 02/06/2022 in [facility]  PHQ-2 Total Score 0 0 0 0 3  PHQ-9 Total Score -- 9 7 -- 9      Flowsheet Row Office Visit from 12/23/2023 in [facility]  C-SSRS RISK CATEGORY No Risk        Assessment and Plan:  Assessment - Diagnosis: PTSD (post-traumatic stress disorder) [F43.10]  2.  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 Bipolar 2 disorder, currently hypomanic (HCC) [F31.81]   Differential Diagnosis: 1. Primary Insomnia - Progress: Pt again continues stating difficulty with insomnia endorsing less than 4 hours of sleep at night stating she woke up at 1am after having a nightmare of her friend, and endorses severe amount of anger and irritability in which she was removed from her last primary care practice. Pt is encouraged this week to seek primary care doctor, Recommended [facility] with Dr. Susan Ziglar - Risk Factors: Suicidal risk, worsening symptoms  Plan - Medications:  Start Lamictal 50mg  once a day, for bipolar, take in the AM.    Start Thorazine  50mg  once a night, for PTSD and insomnia.  Patient has been educated on then importance of taking medication prior to bed due to the sedating nature and has been instructed not to operate a vehicle or machinery with this medication.  Patient has been educated on akathisia, abnormal movements or tremors that are created by the medication to stop or call the clinic as well as galactorrhea or amenorrhea to notify the clinic should occur Stop Abilify  5mg , due to patient concerns with weight gain.   - Psychotherapy: Declined local therapy referral and requested to be placed on wait list for a therapist within  this office. - Education: Patient educated on therapy and the impact and effects of PTSD with therapy in which she is in agreement.  Patient reports she like to be to see a therapist within his office.  Patient educated on the medication Lunesta  of its uses as well as side effects and adverse reaction.  Patient educated on suicidal risk as well as having a firearm within the home to have a secured with ammunition separated as well as to call 911 or go to emergency department should she have suicidal thoughts with or without a plan. - Follow-Up: Follow-up in 1 week for medication monitoring and adjustment - Referrals: No referrals - Safety Planning:  The patient has been educated, if they should have suicidal thoughts with or without a plan to call 911, or go to the closest emergency department.  Pt verbalized understanding.  Pt endorses having a firearm in her RV which she lives in for safety due to being single and living in an RV by herself with her dog.  Patient agrees to keep ammunition and firearm separated.  Pt also agrees to call the clinic should they have worsening symptoms before the next appointment.      Patient/Guardian was advised Release of Information must be obtained prior to any record release in order to collaborate their care with an outside provider. Patient/Guardian  was advised if they have not already done so to contact the registration department to sign all necessary forms in order for us  to release information regarding their care.   Consent: Patient/Guardian gives verbal consent for treatment and assignment of benefits for services provided during this visit. Patient/Guardian expressed understanding and agreed to proceed.    [name], NP 01/22/2024, 7:59 AM

## 2024-01-22 NOTE — Telephone Encounter (Signed)
 Copied from CRM [id number]. Topic: Appointments - Appointment Scheduling >> Jan 22, 2024 10:55 AM Emylou G wrote: New patient

## 2024-01-26 ENCOUNTER — Ambulatory Visit: Admitting: Family Medicine

## 2024-01-26 ENCOUNTER — Encounter: Payer: Self-pay | Admitting: Family Medicine

## 2024-01-26 VITALS — BP 147/96 | HR 70 | Temp 97.6°F | Resp 18 | Ht 67.0 in | Wt 201.0 lb

## 2024-01-26 DIAGNOSIS — Z9189 Other specified personal risk factors, not elsewhere classified: Secondary | ICD-10-CM

## 2024-01-26 DIAGNOSIS — M25532 Pain in left wrist: Secondary | ICD-10-CM | POA: Diagnosis not present

## 2024-01-26 DIAGNOSIS — M19032 Primary osteoarthritis, left wrist: Secondary | ICD-10-CM | POA: Diagnosis not present

## 2024-01-26 DIAGNOSIS — R030 Elevated blood-pressure reading, without diagnosis of hypertension: Secondary | ICD-10-CM | POA: Diagnosis not present

## 2024-01-26 DIAGNOSIS — M19031 Primary osteoarthritis, right wrist: Secondary | ICD-10-CM | POA: Diagnosis not present

## 2024-01-26 MED ORDER — MELOXICAM 15 MG PO TABS: 15.0000 mg | ORAL_TABLET | Freq: Every day | ORAL | 5 refills | Status: DC

## 2024-01-26 NOTE — Progress Notes (Unsigned)
 New Patient Office Visit  Subjective    Patient ID: Kim [name], female    DOB: October 13, 1965  Age: 58 y.o. MRN: [id number]  CC:  Chief Complaint  Patient presents with   Establish Care   Wrist Injury    HPI Kim [name] presents to establish care Delightful 58 year old woman with PTSD, history of AUD, pulmonary nodules, emphysema/COPD, chronic pain syndromes(back pain, hand pain , migraines, RLS, mixed hyperlipidemia, Lumbar radiculopathy bilateral, Left ulnar impaction syndrome, PTSD/bipolar D/O, insomnia, (Followed by Psychiatry), s/p hemicolectomy (secondary to diverticulitis), s/p colostomy and reversal x 2. Today she is complaining of left wrist pain.  She fell on an outstretched hand several weeks ago and is experiencing ulnar impaction syndrome.  She had the hand and wrist x-[facility]ed at [facility].  There were no fractures.  The pain is now located over the ulnar stylus and distally.  This area stays swollen.  Certain movements, especially ulnar deviation are very painful. She quit going to [facility] because she was getting ESI and these did not help her pain.  For pain she takes a pack of 50 BC powders every week.  She thinks she has been taking BC powders since she was about 58 years old.  She does report that her stomach hurts from time to time and she has nausea every day for which she takes Zofran .  She will not take ibuprofen because it makes her stomach ache even if she takes it with food. She is followed by pulmonology and reports that she will not take inhalers that have steroids in them.  She is concerned that the steroids will make her gain weight and increased weight would increase her chronic back pain.  She had a CT scan of the chest 12/01/2023 that is a lung RADS 3 probably benign findings.  Stable pulmonary nodules however slight increased nodularity along the posterior pleural reflection.  Findings are nonspecific but  close interval follow-up is suggested. She drinks alcohol, beer on a daily basis.  She reports yesterday she had 2 12-ounce beers and the day before that she had a single 12 ounce beer.  She has cut way down from her standard 6 to 18 12-ounce beers a day.  She thinks she has been drinking for 25 years.  She quit drinking alcohol between 2008 and 2016.  The reason she is cutting down now is because the beer does not taste good. She has been offered rehab but declined.   She states she smokes 3 to 4 packs a day and has smoked since she was about 58 yo. .   She has restless leg syndrome and takes ropinirole  4 mg 5 a day.  She states the restless legs and gets this so bad she has it in her arms as well as her legs.  Recently had gabapentin  300 mg added at bedtime.  She could not tell a difference.   She reports she does not sleep at about 4 hours a night.  She has a psychiatrist Dr. Cristal Don.   She has a history of migraine headaches that are so severe she would go to the ER and get a shot.  She has not had a migraine HA in a while now.   She follows with podiatry for bilateral foot pains.  Most recently seen for calluses.   She declines to do blood work today.   Outpatient Encounter Medications as of 01/26/2024  Medication Sig   albuterol  (PROVENTIL ) (  2.5 MG/3ML) 0.083% nebulizer solution Take 3 mLs (2.5 mg total) by nebulization every 6 (six) hours as needed for wheezing or shortness of breath.   Aspirin -Caffeine 845-65 MG PACK Take 6-7 each by mouth daily as needed (migraines). BC/ goodie powder   Budeson-Glycopyrrol-Formoterol  (BREZTRI  AEROSPHERE) 160-9-4.8 MCG/ACT AERO Inhale 2 puffs into the lungs in the morning and at bedtime.   budesonide -formoterol  (SYMBICORT ) 160-4.5 MCG/ACT inhaler Inhale 2 puffs into the lungs in the morning and at bedtime.   chlorproMAZINE  (THORAZINE ) 50 MG tablet Take 1 tablet (50 mg total) by mouth at bedtime.   fluticasone  (FLONASE ) 50 MCG/ACT nasal spray shake  liquid AND INSTILL 1 SPRAY IN EACH NOSTRIL DAILY   Fluticasone -Umeclidin-Vilant (TRELEGY ELLIPTA ) 100-62.5-25 MCG/ACT AEPB Inhale 1 puff into the lungs daily.   Fluticasone -Umeclidin-Vilant (TRELEGY ELLIPTA ) 100-62.5-25 MCG/ACT AEPB Inhale 1 puff into the lungs daily.   gabapentin  (NEURONTIN ) 300 MG capsule Take 1 capsule (300 mg total) by mouth at bedtime.   lamoTRIgine (LAMICTAL) 25 MG tablet Take 2 tablets (50 mg total) by mouth daily.   meloxicam (MOBIC) 15 MG tablet Take 1 tablet (15 mg total) by mouth daily.   nitroGLYCERIN  (NITROSTAT ) 0.4 MG SL tablet Place 1 tablet (0.4 mg total) under the tongue every 5 (five) minutes as needed for chest pain. Patient needs appointment for further refills. 1 st attempt   ondansetron  (ZOFRAN -ODT) 8 MG disintegrating tablet DISSOLVE 1 TABLET(8 MG) ON THE TONGUE EVERY 8 HOURS AS NEEDED FOR NAUSEA OR VOMITING   rOPINIRole  (REQUIP ) 4 MG tablet Take 1 tablet (4 mg total) by mouth 5 (five) times daily.   rosuvastatin  (CRESTOR ) 5 MG tablet Take 1 tablet (5 mg total) by mouth daily.   SUMAtriptan  (IMITREX ) 5 MG/ACT nasal spray Place one spray intranasally at onset of migraine. May repeat dose x 1 after 2 hours if headache persists. Max 40 mg/24 hours.   No facility-administered encounter medications on file as of 01/26/2024.    Past Medical History:  Diagnosis Date   Abnormal drug screen (01/08/2022 UDS) 02/26/2022   (01/08/2022) abnormal UDS (+) for carboxy-THC (marijuana)    Abnormal MRI, cervical spine (01/23/2022) 02/26/2022   (01/23/2022) MRI CERVICAL SPINE FINDINGS:  Alignment: Mild reversal the normal cervical lordosis.     DISC LEVELS:  C3-4: Mild bilateral facet arthropathy  C4-5: Minimal disc bulge and mild bilateral facet arthropathy     IMPRESSION:  1. Mild multilevel degenerative change without significant canal or foraminal stenosis.   Abnormal MRI, lumbar spine (10/28/2021) 12/23/2021   (10/28/2021) LUMBAR MRI FINDINGS:  Alignment: Is trace  retrolisthesis of L2 on L3 and L3 on L4, not significantly changed. Alignment is otherwise normal.  Vertebrae: There is anterior compression deformity of the T12 vertebral body with up to approximately 25% loss of vertebral body height anteriorly, not significantly changed since 2021. The other vertebral body heights are preserved. There is mi   Abnormal MRI, thoracic spine (01/23/2022) 02/26/2022   (01/23/2022) MRI THORACIC SPINE FINDINGS:  Alignment: Mildly exaggerated thoracic kyphosis at T11-T12 due to T12 fracture described below.  Vertebrae: Remote T12 compression fracture, unchanged from 10/28/2021 MRI      DISC LEVELS:  Mild disc bulge at T11-12      IMPRESSION:  1. Mild multilevel degenerative change without significant canal or foraminal stenosis.  2. 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  Last Assessment & Plan:   Formatting of this note might be different from the original.  Ongoing. Not well controlled.  Saw Pulmonology whom she felt didn't help her cough.  Requesting new referral to see a different Pulmonologist.  Referral was placed today during the visit.   Cervical facet syndrome 01/08/2022   Cervical radiculopathy 08/01/2020   Cervicalgia 01/08/2022   Cervicogenic headache 01/08/2022   Chronic cough 03/16/2022   Chronic hip pain (2ry area of Pain) (Bilateral) (L>R) 01/08/2022   Chronic low back pain (1ry area of Pain) (Bilateral) (L>R) w/o sciatica 07/04/2021   Chronic lower extremity pain (3ry area of Pain) (Intermittent) (Left) 01/08/2022   Chronic neck and  back pain (5th area of Pain) (Midline) 01/08/2022   Chronic obstructive pulmonary disease (HCC) 06/25/2011   Active smoker  - 04/30/2021  After extensive coaching inhaler device,  effectiveness =    90% with smi > resume stiolto     Formatting of this note might be different from the original.  Formatting of this note might be different from the original.  Active smoker  - 04/30/2021  After extensive coaching inhaler device,  effectiveness =    90% with smi > resume stiolto     Last Assessment & Plan:   Fo   Chronic pain syndrome 12/23/2021   Chronic sacroiliac joint pain (Bilateral) 02/26/2022   Chronic shoulder pain (6th area of Pain) (Bilateral) 01/08/2022   Chronic thoracic back pain (4th area of Pain) (Midline) (Bilateral) 01/08/2022   Cigarette smoker 05/01/2021   DDD (degenerative disc disease), cervical 12/23/2021   DDD (degenerative disc disease), lumbosacral 12/23/2021   (10/28/2021) LUMBAR MRI FINDINGS:  LEVELS:  There is disc desiccation without significant loss of height at L3-L4 and L4-L5. There is disc desiccation and mild narrowing at L1-L2, L2-L3, and L5-S1.  L1-2: central protrusion  L2-3: disc bulge  L3-4: disc bulge with a left foraminal component and annular fissure  L4-5: diffuse disc bulge with a central annular fissure  L5-S1: diffuse disc bulge with   DDD (degenerative disc disease), thoracic 12/23/2021   Depression    Disorder of skeletal system 12/23/2021   Elevated alkaline phosphatase level 02/26/2022   (01/08/2022)- Normal ALP (Alkaline phosphatase) levels are between 44 -121 IU/L, for our Lab. High ALP could suggest liver damage or increased bone cell activity. If other tests such as bilirubin, aspartate aminotransferase (AST), or alanine aminotransferase (ALT) are also high, usually the increased ALP is coming from the liver. Higher-than-normal ALP levels can be seen with: biliary obstruction;    Emphysema of lung (HCC)    Failure to attend appointment 10/20/2022   GERD  (gastroesophageal reflux disease)    Grade 1 Retrolisthesis of cervical region (2 mm) (C4/C5) 12/23/2021   Grade 1 Retrolisthesis of L2/L3 and L3/L4 01/08/2022   (10/28/2021) LUMBAR MRI FINDINGS:  Alignment: Is trace retrolisthesis of L2 on L3 and L3 on L4.  LEVELS:  L2-3: There is a mild disc bulge and mild facet arthropathy  L3-4: There is a mild disc bulge with a left foraminal component and annular fissure and mild left worse than right facet arthropathy resulting in narrowing of the left subarticular zone with  crowding of the traversing nerve root, an   Headache disorder 09/20/2020   History of ileus 02/19/2021   History of marijuana use 02/26/2022   (01/08/2022) abnormal UDS (+) for carboxy-THC (marijuana)   The electronic medical record also indicated positive results on 10/09/2012 and 11/25/2013.   Leg pain    bilateral   Lumbar disc annular tears 01/08/2022   (10/28/2021) LUMBAR MRI FINDINGS:  LEVELS:  L3-4: There is a mild disc bulge with a left foraminal component and annular fissure  L4-5: There is a diffuse disc bulge with a central annular fissure   Lumbar facet syndrome (Bilateral) 01/08/2022   Lumbar lateral recess stenosis (L3-4) (Left) 12/23/2021   Lumbar radiculopathy 08/01/2020   Lumbosacral facet arthropathy (Multilevel) (Bilateral) 12/23/2021   (10/28/2021) LUMBAR MRI FINDINGS:  LEVELS:  L2-3: mild facet arthropathy  L3-4: mild left worse than right facet arthropathy.  L4-5: bilateral facet arthropathy  L5-S1: bilateral facet arthropathy. There are trace bilateral facet joint effusions with mild perifacetal soft tissue edema at this level which could reflect a source of pain.     There is facet arthropathy most advanced at L4-L5 and L5-S   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) (Bilateral: L5-S1) 12/23/2021   (10/28/2021) LUMBAR MRI FINDINGS:  LEVELS:  L3-4: mild disc bulge with a left foraminal component resulting in mild left and no significant right neural foraminal  stenosis. There is possible contact of the exiting left L3 nerve root by foraminal disc material.  L5-S1: diffuse disc bulge with bilateral foraminal components resulting in mild-to-moderate bilateral neural foraminal stenosis.   Lumbosacral lateral recess stenosis (L3-4, L5-S1) (Left) 01/08/2022   (10/28/2021) LUMBAR MRI FINDINGS:  LEVELS:  L3-4: narrowing of the left subarticular zone. There is possible contact of the exiting left L3 nerve root by foraminal disc material.  L5-S1: mild crowding of the left subarticular zone   Marijuana use 02/26/2022   (01/08/2022) abnormal UDS (+) for carboxy-THC (marijuana)    Migraines 07/02/2015   Mixed dyslipidemia 01/07/2023   Multiple pulmonary nodules determined by computed tomography of lung 05/01/2021   Active smoker  03/07/21 (vs 12/04/20) new subpleural  area of airspace consolidation within the anterior basal right upper  lobe abutting the major fissure. This area has a mean derived  - CT 07/17/2021 lesions have either resolved or now change c/w inflammatory etiology plus emphysema  > repeat 18 mplaced  in reminder file      Formatting of this note might be different from the original.  Formattin   Nicotine dependence, cigarettes, w unsp disorders 06/25/2011   Numbness and tingling in hands (Bilateral) 01/08/2022   Occipital headache (Bilateral) 01/08/2022   Other specified chronic obstructive pulmonary disease (HCC) 06/25/2011   Other spondylosis, cervical region (C4/C5 and C5/C6) 12/23/2021   Pharmacologic therapy 12/23/2021   Problems influencing health status 12/23/2021   PTSD (post-traumatic stress disorder) 04/05/2020   Rectal bleeding 02/19/2021   RLS (restless legs syndrome) 07/02/2015   Spondylosis without myelopathy or radiculopathy, lumbosacral region 02/26/2022   Stress incontinence 11/01/2021   T12 compression fracture, sequela 08/01/2020   Thoracic facet syndrome 01/08/2022   Upper airway cough syndrome 05/01/2021   Onset 2021   -  cyclical cough rx  04/30/2021 >>>        Past Surgical History:  Procedure Laterality Date   ABDOMINAL HYSTERECTOMY     APPENDECTOMY  1986   BREAST SURGERY     breast surgey Left    Cyst Excision   CARPAL TUNNEL RELEASE  COLON SURGERY     COLOSTOMY REVISION  2014   EYE SURGERY     LEFT HEART CATH AND CORONARY ANGIOGRAPHY N/A 01/12/2023   Procedure: LEFT HEART CATH AND CORONARY ANGIOGRAPHY;  Surgeon: Avanell Leigh, MD;  Location: [facility];  Service: Cardiovascular;  Laterality: N/A;   SMALL INTESTINE SURGERY     TOTAL ABDOMINAL HYSTERECTOMY W/ BILATERAL SALPINGOOPHORECTOMY  2008   endometriosis   TUBAL LIGATION      Family History  Problem Relation Age of Onset   Alcohol abuse Mother    Anxiety disorder Mother    Depression Mother    Early death Mother    Varicose Veins Mother    Alcohol abuse Father    Heart attack Father    Heart disease Brother    Heart attack Brother    Seizures Daughter    Heart attack Maternal Grandmother    Heart disease Maternal Grandmother    Heart attack Paternal Grandmother    Alcohol abuse Brother    Depression Brother    Early death Brother     Social History   Socioeconomic History   Marital status: Divorced    Spouse name: Not on file   Number of children: 1   Years of education: HS   Highest education level: Some college, no degree  Occupational History   Occupation: Disabled  Tobacco Use   Smoking status: Every Day    Current packs/day: 3.00    Average packs/day: 3.0 packs/day for 42.4 years (127.1 ttl pk-yrs)    Types: Cigarettes, Pipe, Cigars    Start date: 1983    Passive exposure: Current   Smokeless tobacco: Never   Tobacco comments:    3.5-4pdd 04/30/2021  Vaping Use   Vaping status: Former  Substance and Sexual Activity   Alcohol use: Yes    Alcohol/week: 42.0 standard drinks of alcohol    Types: 42 Cans of beer per week   Drug use: Yes    Frequency: 7.0 times per week    Types: Marijuana    Sexual activity: Not Currently    Birth control/protection: Other-see comments    Comment: Menopausal Hysterectomy 2008  Other Topics Concern   Not on file  Social History Narrative   Lives at home alone.   Drinks two pots of coffee per day.   Right-handed.   Social Drivers of Corporate investment banker Strain: Low Risk  (08/05/2023)   Overall Financial Resource Strain (CARDIA)    Difficulty of Paying Living Expenses: Not hard at all  Recent Concern: Financial Resource Strain - High Risk (06/23/2023)   Overall Financial Resource Strain (CARDIA)    Difficulty of Paying Living Expenses: Very hard  Food Insecurity: No Food Insecurity (08/05/2023)   Hunger Vital Sign    Worried About Running Out of Food in the Last Year: Never true    Ran Out of Food in the Last Year: Never true  Transportation Needs: No Transportation Needs (08/05/2023)   PRAPARE - Administrator, Civil Service (Medical): No    Lack of Transportation (Non-Medical): No  Physical Activity: Sufficiently Active (08/05/2023)   Exercise Vital Sign    Days of Exercise per Week: 7 days    Minutes of Exercise per Session: 150+ min  Stress: Stress Concern Present (08/05/2023)   [employer] of Occupational Health - Occupational Stress Questionnaire    Feeling of Stress : Rather much  Social Connections: Socially Isolated (08/27/2023)   Social Connection  and Isolation Panel [NHANES]    Frequency of Communication with Friends and Family: Once a week    Frequency of Social Gatherings with Friends and Family: Never    Attends Religious Services: Never    Database administrator or Organizations: No    Attends Engineer, structural: Not on file    Marital Status: Divorced  Catering manager Violence: Not on file    ROS      Objective   BP (!) 147/96 (BP Location: Left Arm, Patient Position: Sitting, Cuff Size: Normal)   Pulse 70   Temp 97.6 F (36.4 C) (Oral)   Resp 18   Ht 5\' 7"  (1.702 m)    Wt 201 lb (91.2 kg)   SpO2 96%   BMI 31.48 kg/m  {Vitals History (Optional):23777}  Physical Exam Vitals and nursing note reviewed.  Constitutional:      Appearance: Normal appearance.  HENT:     Head: Normocephalic and atraumatic.  Eyes:     Conjunctiva/sclera: Conjunctivae normal.  Cardiovascular:     Rate and Rhythm: Normal rate and regular rhythm.  Pulmonary:     Effort: Pulmonary effort is normal.     Breath sounds: Normal breath sounds.  Musculoskeletal:     Right lower leg: No edema.     Left lower leg: No edema.  Skin:    General: Skin is warm and dry.  Neurological:     Mental Status: She is alert and oriented to person, place, and time.  Psychiatric:        Mood and Affect: Mood normal.        Behavior: Behavior normal.        Thought Content: Thought content normal.        Judgment: Judgment normal.      {Perform Simple Foot Exam  Perform Detailed exam:1} {Insert foot Exam (Optional):30965}   {Labs (Optional):23779}  The 10-year ASCVD risk score (Arnett DK, et al., 2019) is: 5.5%     Assessment & Plan:  Osteoarthritis of both wrists, unspecified osteoarthritis type -     Meloxicam; Take 1 tablet (15 mg total) by mouth daily.  Dispense: 30 tablet; Refill: 5    Return in about 1 week (around 02/02/2024).   Catha Ontko K Gustin Zobrist, MD

## 2024-01-27 DIAGNOSIS — Z9189 Other specified personal risk factors, not elsewhere classified: Secondary | ICD-10-CM | POA: Insufficient documentation

## 2024-01-27 DIAGNOSIS — M25532 Pain in left wrist: Secondary | ICD-10-CM | POA: Insufficient documentation

## 2024-01-27 DIAGNOSIS — R030 Elevated blood-pressure reading, without diagnosis of hypertension: Secondary | ICD-10-CM | POA: Insufficient documentation

## 2024-01-27 NOTE — Assessment & Plan Note (Signed)
 Elevated blood pressure reading and recheck today.  May have smoked prior to coming in.  Ask you to return in a week to get her blood pressure rechecked.  Will discuss salt in her diet.  And the DASH diet

## 2024-01-27 NOTE — Assessment & Plan Note (Signed)
 Ask her to try meloxicam 15 mg once a day with food and stop the [facility] powders.  Ask her to try Salonpas or methyl salicylate, menthol, camphor.

## 2024-01-27 NOTE — Assessment & Plan Note (Signed)
 Advise she has an increased bleeding risk due to aspirin  from the BC powders.  Encouraging her to stop BC powders and to find an an NSAID.  Also encouraging her to use topical analgesics.

## 2024-01-29 ENCOUNTER — Telehealth (INDEPENDENT_AMBULATORY_CARE_PROVIDER_SITE_OTHER): Admitting: Psychiatry

## 2024-01-29 DIAGNOSIS — F4310 Post-traumatic stress disorder, unspecified: Secondary | ICD-10-CM | POA: Diagnosis not present

## 2024-01-29 DIAGNOSIS — F5105 Insomnia due to other mental disorder: Secondary | ICD-10-CM | POA: Diagnosis not present

## 2024-01-29 DIAGNOSIS — F3181 Bipolar II disorder: Secondary | ICD-10-CM | POA: Diagnosis not present

## 2024-01-29 DIAGNOSIS — F99 Mental disorder, not otherwise specified: Secondary | ICD-10-CM

## 2024-01-29 MED ORDER — CHLORPROMAZINE HCL 100 MG PO TABS: 100.0000 mg | ORAL_TABLET | Freq: Every day | ORAL | 0 refills | Status: DC

## 2024-01-29 NOTE — Progress Notes (Addendum)
 BH MD/PA/NP OP Progress Note  01/29/2024 8:29 AM Kim [name]  MRN:  [id number]  Chief Complaint: Routine Follow-up  Virtual Visit via Video Note   I connected with Kim [name] on 01/29/24 at  8:30 AM EDT by a video enabled telemedicine application and verified that I am speaking with the correct person using two identifiers.   Location: Patient: [name]  [place] [address]  Provider: [facility]   I discussed the limitations of evaluation and management by telemedicine and the availability of in person appointments. The patient expressed understanding and agreed to proceed.     I discussed the assessment and treatment plan with the patient. The patient was provided an opportunity to ask questions and all were answered. The patient agreed with the plan and demonstrated an understanding of the instructions.   The patient was advised to call back or seek an in-person evaluation if the symptoms worsen or if the condition fails to improve as anticipated.   I provided 30 minutes of non-face-to-face time during this encounter.     [name], NP   HPI: 58 year old female presents [facility] for follow-up.  Patient reports on today's visit that she has slept from 11:30 at night to 4:30 in the morning for the first time in a long time.  Patient reports after getting sleep like this, has increased her mood as well as help her manage her emotions throughout the day.  Patient reports that the medications have been working as intended and that has not been experiencing side effects.  Currently the patient is on a regimen of 50 mg of Thorazine  once a night daily with 50 mg of Lamictal daily.  Patient has been instructed that sleep will still continue to be focused on today's visit and to increase the Thorazine  to 100 mg once a day at night to help improve sleep.  Patient reports that she is satisfied with her current progress of her care stating that she is really happy  that she was able to get established with Dr. Susan [name] stating that she feels that she is a good fit for her.  Patient also reports that the medication is working and that she is glad that something is finally helping with her sleep.  Patient has denied night terrors stating she does have vivid dreams but is not waking her up from sleep with the medication.  Based on this assessment interview patient will follow up in 1 week for medication follow-up as well as to continue monitoring symptoms.  Patient continues to deny SI, HI, AVH.  Patient reports that she will contact the clinic if anything comes up sooner than the next appointment.  Patient follow-up in 1 week. Visit Diagnosis:    ICD-10-CM   1. PTSD (post-traumatic stress disorder)  F43.10 chlorproMAZINE  (THORAZINE ) 100 MG tablet    2. Insomnia due to other mental disorder  F51.05 chlorproMAZINE  (THORAZINE ) 100 MG tablet   F99     3. Bipolar 2 disorder (HCC)  F31.81       Past Psychiatric History:  Previous Psych Hospitalizations: Last hospitalization on 2004 after the discovery of her husband cheating on her with her biological daughter.  Patient endorses being hospitalized 14 times since adolescence.   Psychiatric diagnosis history: Bipolar, paranoid schizophrenia, PTSD, major depressive disorder, anxiety.   Medications Current: -Gabapentin  -Thorazine  100mg  once daily at night -Lamictal 50mg  once a day  Next Steps: -Evaluate sleep -Consider increasing Lamictal   Medication Trials: -  01/07/24 Lunesta , poor response and side effects of "arm and leg jerking" - 01/02/24 Abilify , pt weight gain -Duloxetine - caused a colostomy due to gastric complications. -Trazodone-states significant side effects while taking therapeutic dose and stopped. -Patient reports many medications have been tried and failed but unable to recall patient states that if mentioned will notify if tried or not. Suicide & Violence: Patient currently denies SI,  HI.  Paper should reports having a firearm in the home but has no thoughts of plan to harm herself.   Psychotherapy: Patient is open to therapy but will not seek therapy in the local community and is requesting to see [facility] therapist.  In placed on wait list.   Legal: Denies  Past Medical History:  Past Medical History:  Diagnosis Date   Abnormal drug screen (01/08/2022 UDS) 02/26/2022   (01/08/2022) abnormal UDS (+) for carboxy-THC (marijuana)    Abnormal MRI, cervical spine (01/23/2022) 02/26/2022   (01/23/2022) MRI CERVICAL SPINE FINDINGS:  Alignment: Mild reversal the normal cervical lordosis.     DISC LEVELS:  C3-4: Mild bilateral facet arthropathy  C4-5: Minimal disc bulge and mild bilateral facet arthropathy     IMPRESSION:  1. Mild multilevel degenerative change without significant canal or foraminal stenosis.   Abnormal MRI, lumbar spine (10/28/2021) 12/23/2021   (10/28/2021) LUMBAR MRI FINDINGS:  Alignment: Is trace retrolisthesis of L2 on L3 and L3 on L4, not significantly changed. Alignment is otherwise normal.  Vertebrae: There is anterior compression deformity of the T12 vertebral body with up to approximately 25% loss of vertebral body height anteriorly, not significantly changed since 2021. The other vertebral body heights are preserved. There is mi   Abnormal MRI, thoracic spine (01/23/2022) 02/26/2022   (01/23/2022) MRI THORACIC SPINE FINDINGS:  Alignment: Mildly exaggerated thoracic kyphosis at T11-T12 due to T12 fracture described below.  Vertebrae: Remote T12 compression fracture, unchanged from 10/28/2021 MRI      DISC LEVELS:  Mild disc bulge at T11-12      IMPRESSION:  1. Mild multilevel degenerative change without significant canal or foraminal stenosis.  2. Remote T12 fracture with similar    Allergy    Angina pectoris (HCC) 01/07/2023   Aortic atherosclerosis (HCC) 01/14/2021   Arthritis    Asthma    Atherosclerotic heart disease of native coronary artery without angina  pectoris    Bilateral stenosis of lateral recess of lumbar spine    Bipolar disorder (HCC)    Carpal tunnel syndrome (Bilateral) 01/08/2022   Centrilobular emphysema (HCC) 02/21/2016   Formatting of this note might be different from the original.  Last Assessment & Plan:   Formatting of this note might be different from the original.  Ongoing. Not well controlled.  Saw Pulmonology whom she felt didn't help her cough.  Requesting new referral to see a different Pulmonologist.  Referral was placed today during the visit.   Cervical facet syndrome 01/08/2022   Cervical radiculopathy 08/01/2020   Cervicalgia 01/08/2022   Cervicogenic headache 01/08/2022   Chronic cough 03/16/2022   Chronic hip pain (2ry area of Pain) (Bilateral) (L>R) 01/08/2022   Chronic low back pain (1ry area of Pain) (Bilateral) (L>R) w/o sciatica 07/04/2021   Chronic lower extremity pain (3ry area of Pain) (Intermittent) (Left) 01/08/2022   Chronic neck and back pain (5th area of Pain) (Midline) 01/08/2022   Chronic obstructive pulmonary disease (HCC) 06/25/2011   Active smoker  - 04/30/2021  After extensive coaching inhaler device,  effectiveness =    90%  with smi > resume stiolto     Formatting of this note might be different from the original.  Formatting of this note might be different from the original.  Active smoker  - 04/30/2021  After extensive coaching inhaler device,  effectiveness =    90% with smi > resume stiolto     Last Assessment & Plan:   Fo   Chronic pain syndrome 12/23/2021   Chronic sacroiliac joint pain (Bilateral) 02/26/2022   Chronic shoulder pain (6th area of Pain) (Bilateral) 01/08/2022   Chronic thoracic back pain (4th area of Pain) (Midline) (Bilateral) 01/08/2022   Cigarette smoker 05/01/2021   DDD (degenerative disc disease), cervical 12/23/2021   DDD (degenerative disc disease), lumbosacral 12/23/2021   (10/28/2021) LUMBAR MRI FINDINGS:  LEVELS:  There is disc desiccation without significant loss  of height at L3-L4 and L4-L5. There is disc desiccation and mild narrowing at L1-L2, L2-L3, and L5-S1.  L1-2: central protrusion  L2-3: disc bulge  L3-4: disc bulge with a left foraminal component and annular fissure  L4-5: diffuse disc bulge with a central annular fissure  L5-S1: diffuse disc bulge with   DDD (degenerative disc disease), thoracic 12/23/2021   Depression    Disorder of skeletal system 12/23/2021   Elevated alkaline phosphatase level 02/26/2022   (01/08/2022)- Normal ALP (Alkaline phosphatase) levels are between 44 -121 IU/L, for our Lab. High ALP could suggest liver damage or increased bone cell activity. If other tests such as bilirubin, aspartate aminotransferase (AST), or alanine aminotransferase (ALT) are also high, usually the increased ALP is coming from the liver. Higher-than-normal ALP levels can be seen with: biliary obstruction;    Emphysema of lung (HCC)    Failure to attend appointment 10/20/2022   GERD (gastroesophageal reflux disease)    Grade 1 Retrolisthesis of cervical region (2 mm) (C4/C5) 12/23/2021   Grade 1 Retrolisthesis of L2/L3 and L3/L4 01/08/2022   (10/28/2021) LUMBAR MRI FINDINGS:  Alignment: Is trace retrolisthesis of L2 on L3 and L3 on L4.  LEVELS:  L2-3: There is a mild disc bulge and mild facet arthropathy  L3-4: There is a mild disc bulge with a left foraminal component and annular fissure and mild left worse than right facet arthropathy resulting in narrowing of the left subarticular zone with crowding of the traversing nerve root, an   Headache disorder 09/20/2020   History of ileus 02/19/2021   History of marijuana use 02/26/2022   (01/08/2022) abnormal UDS (+) for carboxy-THC (marijuana)   The electronic medical record also indicated positive results on 10/09/2012 and 11/25/2013.   Leg pain    bilateral   Lumbar disc annular tears 01/08/2022   (10/28/2021) LUMBAR MRI FINDINGS:  LEVELS:  L3-4: There is a mild disc bulge with a left foraminal  component and annular fissure  L4-5: There is a diffuse disc bulge with a central annular fissure   Lumbar facet syndrome (Bilateral) 01/08/2022   Lumbar lateral recess stenosis (L3-4) (Left) 12/23/2021   Lumbar radiculopathy 08/01/2020   Lumbosacral facet arthropathy (Multilevel) (Bilateral) 12/23/2021   (10/28/2021) LUMBAR MRI FINDINGS:  LEVELS:  L2-3: mild facet arthropathy  L3-4: mild left worse than right facet arthropathy.  L4-5: bilateral facet arthropathy  L5-S1: bilateral facet arthropathy. There are trace bilateral facet joint effusions with mild perifacetal soft tissue edema at this level which could reflect a source of pain.     There is facet arthropathy most advanced at L4-L5 and L5-S   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) (Bilateral: L5-S1)  12/23/2021   (10/28/2021) LUMBAR MRI FINDINGS:  LEVELS:  L3-4: mild disc bulge with a left foraminal component resulting in mild left and no significant right neural foraminal stenosis. There is possible contact of the exiting left L3 nerve root by foraminal disc material.  L5-S1: diffuse disc bulge with bilateral foraminal components resulting in mild-to-moderate bilateral neural foraminal stenosis.   Lumbosacral lateral recess stenosis (L3-4, L5-S1) (Left) 01/08/2022   (10/28/2021) LUMBAR MRI FINDINGS:  LEVELS:  L3-4: narrowing of the left subarticular zone. There is possible contact of the exiting left L3 nerve root by foraminal disc material.  L5-S1: mild crowding of the left subarticular zone   Marijuana use 02/26/2022   (01/08/2022) abnormal UDS (+) for carboxy-THC (marijuana)    Migraines 07/02/2015   Mixed dyslipidemia 01/07/2023   Multiple pulmonary nodules determined by computed tomography of lung 05/01/2021   Active smoker  03/07/21 (vs 12/04/20) new subpleural  area of airspace consolidation within the anterior basal right upper  lobe abutting the major fissure. This area has a mean derived  - CT 07/17/2021 lesions have either resolved  or now change c/w inflammatory etiology plus emphysema  > repeat 18 mplaced  in reminder file      Formatting of this note might be different from the original.  Formattin   Nicotine dependence, cigarettes, w unsp disorders 06/25/2011   Numbness and tingling in hands (Bilateral) 01/08/2022   Occipital headache (Bilateral) 01/08/2022   Other specified chronic obstructive pulmonary disease (HCC) 06/25/2011   Other spondylosis, cervical region (C4/C5 and C5/C6) 12/23/2021   Pharmacologic therapy 12/23/2021   Problems influencing health status 12/23/2021   PTSD (post-traumatic stress disorder) 04/05/2020   Rectal bleeding 02/19/2021   RLS (restless legs syndrome) 07/02/2015   Spondylosis without myelopathy or radiculopathy, lumbosacral region 02/26/2022   Stress incontinence 11/01/2021   T12 compression fracture, sequela 08/01/2020   Thoracic facet syndrome 01/08/2022   Upper airway cough syndrome 05/01/2021   Onset 2021   - cyclical cough rx  04/30/2021 >>>        Past Surgical History:  Procedure Laterality Date   ABDOMINAL HYSTERECTOMY     APPENDECTOMY  1986   BREAST SURGERY     breast surgey Left    Cyst Excision   CARPAL TUNNEL RELEASE     COLON SURGERY     COLOSTOMY REVISION  2014   EYE SURGERY     LEFT HEART CATH AND CORONARY ANGIOGRAPHY N/A 01/12/2023   Procedure: LEFT HEART CATH AND CORONARY ANGIOGRAPHY;  Surgeon: Avanell Leigh, MD;  Location: [facility];  Service: Cardiovascular;  Laterality: N/A;   SMALL INTESTINE SURGERY     TOTAL ABDOMINAL HYSTERECTOMY W/ BILATERAL SALPINGOOPHORECTOMY  2008   endometriosis   TUBAL LIGATION      Family Psychiatric History: Her mother has been hospitalized and attempted suicide in the past.   Family History:  Family History  Problem Relation Age of Onset   Alcohol abuse Mother    Anxiety disorder Mother    Depression Mother    Early death Mother    Varicose Veins Mother    Alcohol abuse Father    Heart attack Father     Heart disease Brother    Heart attack Brother    Seizures Daughter    Heart attack Maternal Grandmother    Heart disease Maternal Grandmother    Heart attack Paternal Grandmother    Alcohol abuse Brother    Depression Brother    Early death  Brother     Social History:  Social History   Socioeconomic History   Marital status: Divorced    Spouse name: Not on file   Number of children: 1   Years of education: HS   Highest education level: Some college, no degree  Occupational History   Occupation: Disabled  Tobacco Use   Smoking status: Every Day    Current packs/day: 3.00    Average packs/day: 3.0 packs/day for 42.4 years (127.1 ttl pk-yrs)    Types: Cigarettes, Pipe, Cigars    Start date: 1983    Passive exposure: Current   Smokeless tobacco: Never   Tobacco comments:    3.5-4pdd 04/30/2021  Vaping Use   Vaping status: Former  Substance and Sexual Activity   Alcohol use: Yes    Alcohol/week: 42.0 standard drinks of alcohol    Types: 42 Cans of beer per week   Drug use: Yes    Frequency: 7.0 times per week    Types: Marijuana   Sexual activity: Not Currently    Birth control/protection: Other-see comments    Comment: Menopausal Hysterectomy 2008  Other Topics Concern   Not on file  Social History Narrative   Lives at home alone.   Drinks two pots of coffee per day.   Right-handed.   Social Drivers of Corporate investment banker Strain: Low Risk  (08/05/2023)   Overall Financial Resource Strain (CARDIA)    Difficulty of Paying Living Expenses: Not hard at all  Recent Concern: Financial Resource Strain - High Risk (06/23/2023)   Overall Financial Resource Strain (CARDIA)    Difficulty of Paying Living Expenses: Very hard  Food Insecurity: No Food Insecurity (08/05/2023)   Hunger Vital Sign    Worried About Running Out of Food in the Last Year: Never true    Ran Out of Food in the Last Year: Never true  Transportation Needs: No Transportation Needs  (08/05/2023)   PRAPARE - Administrator, Civil Service (Medical): No    Lack of Transportation (Non-Medical): No  Physical Activity: Sufficiently Active (08/05/2023)   Exercise Vital Sign    Days of Exercise per Week: 7 days    Minutes of Exercise per Session: 150+ min  Stress: Stress Concern Present (08/05/2023)   [employer] of Occupational Health - Occupational Stress Questionnaire    Feeling of Stress : Rather much  Social Connections: Socially Isolated (08/27/2023)   Social Connection and Isolation Panel [NHANES]    Frequency of Communication with Friends and Family: Once a week    Frequency of Social Gatherings with Friends and Family: Never    Attends Religious Services: Never    Database administrator or Organizations: No    Attends Engineer, structural: Not on file    Marital Status: Divorced    Allergies:  Allergies  Allergen Reactions   Penicillin G     Other Reaction(s): Not available, Not available  Other Reaction(s): Not available   Penicillins Other (See Comments)    Unknown childhood reaction   Sulfa Antibiotics Swelling    Metabolic Disorder Labs: Lab Results  Component Value Date   HGBA1C 6.1 08/31/2023   No results found for: "PROLACTIN" Lab Results  Component Value Date   CHOL 186 08/31/2023   TRIG 99.0 08/31/2023   HDL 71.10 08/31/2023   CHOLHDL 3 08/31/2023   VLDL 19.8 08/31/2023   LDLCALC 95 08/31/2023   LDLCALC 95 11/01/2021   Lab Results  Component Value Date  TSH 3.42 08/31/2023   TSH 1.130 02/19/2021    Therapeutic Level Labs: No results found for: "LITHIUM" No results found for: "VALPROATE" No results found for: "CBMZ"  Current Medications: Current Outpatient Medications  Medication Sig Dispense Refill   albuterol  (PROVENTIL ) (2.5 MG/3ML) 0.083% nebulizer solution Take 3 mLs (2.5 mg total) by nebulization every 6 (six) hours as needed for wheezing or shortness of breath. 3 mL 0   Aspirin -Caffeine  845-65 MG PACK Take 6-7 each by mouth daily as needed (migraines). BC/ goodie powder 56 each    Budeson-Glycopyrrol-Formoterol  (BREZTRI  AEROSPHERE) 160-9-4.8 MCG/ACT AERO Inhale 2 puffs into the lungs in the morning and at bedtime. 3 each 3   budesonide -formoterol  (SYMBICORT ) 160-4.5 MCG/ACT inhaler Inhale 2 puffs into the lungs in the morning and at bedtime. 1 each 12   chlorproMAZINE  (THORAZINE ) 50 MG tablet Take 1 tablet (50 mg total) by mouth at bedtime. 30 tablet 1   fluticasone  (FLONASE ) 50 MCG/ACT nasal spray shake liquid AND INSTILL 1 SPRAY IN EACH NOSTRIL DAILY 16 g 2   Fluticasone -Umeclidin-Vilant (TRELEGY ELLIPTA ) 100-62.5-25 MCG/ACT AEPB Inhale 1 puff into the lungs daily. 3 each 3   Fluticasone -Umeclidin-Vilant (TRELEGY ELLIPTA ) 100-62.5-25 MCG/ACT AEPB Inhale 1 puff into the lungs daily. 28 each 0   gabapentin  (NEURONTIN ) 300 MG capsule Take 1 capsule (300 mg total) by mouth at bedtime. 30 capsule 0   lamoTRIgine (LAMICTAL) 25 MG tablet Take 2 tablets (50 mg total) by mouth daily. 30 tablet 1   meloxicam (MOBIC) 15 MG tablet Take 1 tablet (15 mg total) by mouth daily. 30 tablet 5   nitroGLYCERIN  (NITROSTAT ) 0.4 MG SL tablet Place 1 tablet (0.4 mg total) under the tongue every 5 (five) minutes as needed for chest pain. Patient needs appointment for further refills. 1 st attempt 25 tablet 0   ondansetron  (ZOFRAN -ODT) 8 MG disintegrating tablet DISSOLVE 1 TABLET(8 MG) ON THE TONGUE EVERY 8 HOURS AS NEEDED FOR NAUSEA OR VOMITING 90 tablet 3   rOPINIRole  (REQUIP ) 4 MG tablet Take 1 tablet (4 mg total) by mouth 5 (five) times daily. 150 tablet 0   rosuvastatin  (CRESTOR ) 5 MG tablet Take 1 tablet (5 mg total) by mouth daily. 30 tablet 0   SUMAtriptan  (IMITREX ) 5 MG/ACT nasal spray Place one spray intranasally at onset of migraine. May repeat dose x 1 after 2 hours if headache persists. Max 40 mg/24 hours. 1 each 5   No current facility-administered medications for this visit.      Musculoskeletal: Strength & Muscle Tone: within normal limits Gait & Station: normal Patient leans: N/A   Psychiatric Specialty Exam: Review of Systems  Constitutional: Negative.   HENT: Negative.    Eyes: Negative.   Respiratory: Negative.    Cardiovascular: Negative.   Gastrointestinal: Negative.   Endocrine: Negative.   Genitourinary: Negative.   Musculoskeletal:  Positive for myalgias.  Skin: Negative.   Allergic/Immunologic: Negative.   Neurological: Negative.   Hematological: Negative.   Psychiatric/Behavioral:  Positive for behavioral problems, decreased concentration and dysphoric mood. The patient is nervous/anxious.      There were no vitals taken for this visit.There is no height or weight on file to calculate BMI.  General Appearance: Well Groomed  Eye Contact:  Good  Speech:  Clear and Coherent  Volume:  Normal  Mood:  Anxious and Depressed  Affect:  Depressed  Thought Process:  Coherent  Orientation:  Full (Time, Place, and Person)  Thought Content: WDL   Suicidal Thoughts:  No  Homicidal Thoughts:  No  Memory:  Immediate;   Good Recent;   Good Remote;   Good  Judgement:  Good  Insight:  Good  Psychomotor Activity:  Normal  Concentration:  Concentration: Good and Attention Span: Good  Recall:  Good  Fund of Knowledge: Good  Language: Good  Akathisia:  No  Handed:  Right  AIMS (if indicated):   Assets:  Financial Resources/Insurance Housing  ADL's:  Intact  Cognition: WNL  Sleep:  Poor    Screenings: AUDIT    Flowsheet Row Office Visit from 08/07/2023 in [facility] [employer] Most recent reading at 08/05/2023 10:43 AM Office Visit from 06/23/2023 in [facility] [employer] Most recent reading at 06/23/2023  5:01 AM Appointment from 07/23/2023 in [facility] [employer] Most recent reading at 06/04/2023  5:03 AM Appointment from 06/09/2023 in [facility] [place] Most recent reading at 06/04/2023  5:03 AM  Alcohol Use Disorder Identification Test Final Score (AUDIT) 14  12  12  12        GAD-7    Flowsheet Row Office Visit from 01/26/2024 in [facility] Visit from 08/07/2023 in [facility] [employer] Visit from 06/23/2023 in [facility] [employer] Visit from 02/06/2022 in [facility] Office Visit from 11/01/2021 in [facility]  Total GAD-7 Score 15 18 6 18 18       PHQ2-9    Flowsheet Row Office Visit from 01/26/2024 in [facility] Visit from 12/23/2023 in [facility] Office Visit from 08/07/2023 in [facility] Visit from 06/23/2023 in [facility] [employer] Visit from 02/26/2022 in [facility] Total Score 1 0 0 0 0  PHQ-9 Total Score 12 -- 9 7 --      Flowsheet Row Office Visit from 12/23/2023 in [facility]  C-SSRS RISK CATEGORY No Risk        Assessment and Plan:  Assessment - Diagnosis: PTSD (post-traumatic stress disorder) [F43.10]  2.  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 Bipolar 2 disorder, currently hypomanic (HCC) [F31.81]   Differential Diagnosis: 1. Primary Insomnia - Progress: Patient with report that she has established care with Dr. Ziglar in which she is very happy with the provider and being placed under her as primary care.  Patient also reported that the Thorazine  has been experiencing therapeutic effects and has increased in her sleep stating that she has slept from 11:30-4:30 for the first time in her life.  Patient has been encouraged that continue to  progress of taking medications to help manage sleep.  Patient does endorse increased mood and better manage mood.  - Risk Factors: Suicidal risk, worsening symptoms  Plan - Medications:  Continue Lamictal 50mg  once a day, for bipolar, take in the AM.   Increase Thorazine  100mg  once a night, for PTSD and insomnia.  Patient has been educated on then importance of taking medication prior to bed due to the sedating nature and has been instructed not to operate a vehicle or machinery with this medication.  Patient has been educated on akathisia, abnormal movements or tremors that are created by the medication to stop  or call the clinic as well as galactorrhea or amenorrhea to notify the clinic should occur    - Psychotherapy: Declined local therapy referral and requested to be placed on wait list for a therapist within this office. - Education: Patient educated on therapy and the impact and effects of PTSD with therapy in which she is in agreement.  Patient reports she like to be to see a therapist within his office.  Patient educated on the medication Lunesta  of its uses as well as side effects and adverse reaction.  Patient educated on suicidal risk as well as having a firearm within the home to have a secured with ammunition separated as well as to call 911 or go to emergency department should she have suicidal thoughts with or without a plan. - Follow-Up: Follow-up in 1 week for medication monitoring and adjustment - Referrals: No referrals - Safety Planning:  The patient has been educated, if they should have suicidal thoughts with or without a plan to call 911, or go to the closest emergency department.  Pt verbalized understanding.  Pt endorses having a firearm in her RV which she lives in for safety due to being single and living in an RV by herself with her dog.  Patient agrees to keep ammunition and firearm separated.  Pt also agrees to call the clinic should they have worsening symptoms before  the next appointment.      Patient/Guardian was advised Release of Information must be obtained prior to any record release in order to collaborate their care with an outside provider. Patient/Guardian was advised if they have not already done so to contact the registration department to sign all necessary forms in order for us  to release information regarding their care.   Consent: Patient/Guardian gives verbal consent for treatment and assignment of benefits for services provided during this visit. Patient/Guardian expressed understanding and agreed to proceed.    [name], NP 01/29/2024, 8:29 AM

## 2024-02-02 ENCOUNTER — Ambulatory Visit: Admitting: Family Medicine

## 2024-02-03 ENCOUNTER — Ambulatory Visit: Admitting: Family Medicine

## 2024-02-05 ENCOUNTER — Telehealth (INDEPENDENT_AMBULATORY_CARE_PROVIDER_SITE_OTHER): Admitting: Psychiatry

## 2024-02-05 DIAGNOSIS — F5105 Insomnia due to other mental disorder: Secondary | ICD-10-CM

## 2024-02-05 DIAGNOSIS — F4310 Post-traumatic stress disorder, unspecified: Secondary | ICD-10-CM

## 2024-02-05 DIAGNOSIS — F3181 Bipolar II disorder: Secondary | ICD-10-CM

## 2024-02-05 DIAGNOSIS — F99 Mental disorder, not otherwise specified: Secondary | ICD-10-CM

## 2024-02-05 MED ORDER — CHLORPROMAZINE HCL 100 MG PO TABS: 100.0000 mg | ORAL_TABLET | Freq: Three times a day (TID) | ORAL | 2 refills | Status: DC | PRN

## 2024-02-05 MED ORDER — CHLORPROMAZINE HCL 100 MG PO TABS: 100.0000 mg | ORAL_TABLET | Freq: Every day | ORAL | 0 refills | Status: DC

## 2024-02-05 MED ORDER — LAMOTRIGINE 100 MG PO TABS: 100.0000 mg | ORAL_TABLET | Freq: Every day | ORAL | 2 refills | Status: DC

## 2024-02-05 NOTE — Progress Notes (Cosign Needed Addendum)
 BH MD/PA/NP OP Progress Note  02/05/2024 8:30 AM Kim [name]  MRN:  [id number]  Chief Complaint: Routine follow-up  Virtual Visit via Video Note  I connected with Kim [name] on 02/05/24 at  8:30 AM EDT by a video enabled telemedicine application and verified that I am speaking with the correct person using two identifiers.  Location: Patient: [name]  [place] [address]  Provider: [place] [facility] office of provider   I discussed the limitations of evaluation and management by telemedicine and the availability of in person appointments. The patient expressed understanding and agreed to proceed.    I discussed the assessment and treatment plan with the patient. The patient was provided an opportunity to ask questions and all were answered. The patient agreed with the plan and demonstrated an understanding of the instructions.   The patient was advised to call back or seek an in-person evaluation if the symptoms worsen or if the condition fails to improve as anticipated.  I provided 30 minutes of non-face-to-face time during this encounter.   [name], NP    HPI: 58 year old female presenting to [facility] for follow-up.  Patient reports that she has had a rough week stating that she had too many appointments this week and she felt overwhelmed to cancel her appointment with Dr. Ziglar.  Patient also reports that she will with her ankle earlier this morning and states that she gets easily irritated whenever accidents like this happen.  Patient reports that sleep has been consistent but has not slept good last night stating that she is waking up twice last night, reports that all in all her neighbors are noticing that she is improving in her mood as well as her care for self.  Patient also reports that she is consistently seeing Dr. Ziglar and is very happy with the care to Dr. Ziglar provider.  Patient patient symptoms patient reports that she continues to have  irritability with day-to-day tasks due to feeling overwhelmed in which she was reminded that these are situational and that she should give herself more patient to grace.  Patient was encouraged to continue medications stating that Thorazine  is targeting her sleep and is showing signs of improvement especially with her PTSD.  Patient with no recent reports of nightmares, referenced in previous visits.  Patient also being provided with Lamictal  and is increasing her dose to 100 mg daily as we work towards a therapeutic dose.  Patient reports that the mood stability is evident and states that she is not getting as irritated and is actually holding her words as she described that she does cussed a lot and states she has similar job but has stated that she has been pausing to collect her self anticipating the correct words.  Based on this assessment interview is recommended for the patient to continue treatment for increasing Lamictal  200 mg once daily and continue Thorazine  100 mg once daily at night before bed.  Patient is to call the clinic should she have symptoms worsening before next appointment.  Patient denies SI, HI, AVH.  Patient is in agreement with treatment plan.  No other questions or concerns at this time. Visit Diagnosis:    ICD-10-CM   1. Bipolar 2 disorder (HCC)  F31.81 lamoTRIgine  (LAMICTAL ) 100 MG tablet    2. PTSD (post-traumatic stress disorder)  F43.10 chlorproMAZINE  (THORAZINE ) 100 MG tablet    DISCONTINUED: chlorproMAZINE  (THORAZINE ) 100 MG tablet    3. Insomnia due to other mental disorder  F51.05 chlorproMAZINE  (THORAZINE ) 100 MG tablet   F99 DISCONTINUED: chlorproMAZINE  (THORAZINE ) 100 MG tablet      Past Psychiatric History:  Previous Psych Hospitalizations: Last hospitalization on 2004 after the discovery of her husband cheating on her with her biological daughter.  Patient endorses being hospitalized 14 times since adolescence.   Psychiatric diagnosis history: Bipolar,  paranoid schizophrenia, PTSD, major depressive disorder, anxiety.   Medications Current: -Gabapentin  -Thorazine  100mg  once daily at night -Lamictal  100mg  once a day   Next Steps: -Evaluate sleep -Consider increasing Lamictal    Medication Trials: - 01/07/24 Lunesta , poor response and side effects of "arm and leg jerking" - 01/02/24 Abilify , pt weight gain -Duloxetine - caused a colostomy due to gastric complications. -Trazodone-states significant side effects while taking therapeutic dose and stopped. -Patient reports many medications have been tried and failed but unable to recall patient states that if mentioned will notify if tried or not. Suicide & Violence: Patient currently denies SI, HI.  Paper should reports having a firearm in the home but has no thoughts of plan to harm herself.   Psychotherapy: Patient is open to therapy but will not seek therapy in the local community and is requesting to see [facility] therapist.  In placed on wait list.   Legal: Denies  Past Medical History:  Past Medical History:  Diagnosis Date   Abnormal drug screen (01/08/2022 UDS) 02/26/2022   (01/08/2022) abnormal UDS (+) for carboxy-THC (marijuana)    Abnormal MRI, cervical spine (01/23/2022) 02/26/2022   (01/23/2022) MRI CERVICAL SPINE FINDINGS:  Alignment: Mild reversal the normal cervical lordosis.     DISC LEVELS:  C3-4: Mild bilateral facet arthropathy  C4-5: Minimal disc bulge and mild bilateral facet arthropathy     IMPRESSION:  1. Mild multilevel degenerative change without significant canal or foraminal stenosis.   Abnormal MRI, lumbar spine (10/28/2021) 12/23/2021   (10/28/2021) LUMBAR MRI FINDINGS:  Alignment: Is trace retrolisthesis of L2 on L3 and L3 on L4, not significantly changed. Alignment is otherwise normal.  Vertebrae: There is anterior compression deformity of the T12 vertebral body with up to approximately 25% loss of vertebral body height anteriorly, not significantly changed since 2021.  The other vertebral body heights are preserved. There is mi   Abnormal MRI, thoracic spine (01/23/2022) 02/26/2022   (01/23/2022) MRI THORACIC SPINE FINDINGS:  Alignment: Mildly exaggerated thoracic kyphosis at T11-T12 due to T12 fracture described below.  Vertebrae: Remote T12 compression fracture, unchanged from 10/28/2021 MRI      DISC LEVELS:  Mild disc bulge at T11-12      IMPRESSION:  1. Mild multilevel degenerative change without significant canal or foraminal stenosis.  2. Remote T12 fracture with similar    Allergy    Angina pectoris (HCC) 01/07/2023   Aortic atherosclerosis (HCC) 01/14/2021   Arthritis    Asthma    Atherosclerotic heart disease of native coronary artery without angina pectoris    Bilateral stenosis of lateral recess of lumbar spine    Bipolar disorder (HCC)    Carpal tunnel syndrome (Bilateral) 01/08/2022   Centrilobular emphysema (HCC) 02/21/2016   Formatting of this note might be different from the original.  Last Assessment & Plan:   Formatting of this note might be different from the original.  Ongoing. Not well controlled.  Saw Pulmonology whom she felt didn't help her cough.  Requesting new referral to see a different Pulmonologist.  Referral was placed today during the visit.   Cervical facet syndrome 01/08/2022   Cervical radiculopathy  08/01/2020   Cervicalgia 01/08/2022   Cervicogenic headache 01/08/2022   Chronic cough 03/16/2022   Chronic hip pain (2ry area of Pain) (Bilateral) (L>R) 01/08/2022   Chronic low back pain (1ry area of Pain) (Bilateral) (L>R) w/o sciatica 07/04/2021   Chronic lower extremity pain (3ry area of Pain) (Intermittent) (Left) 01/08/2022   Chronic neck and back pain (5th area of Pain) (Midline) 01/08/2022   Chronic obstructive pulmonary disease (HCC) 06/25/2011   Active smoker  - 04/30/2021  After extensive coaching inhaler device,  effectiveness =    90% with smi > resume stiolto     Formatting of this note might be different from the  original.  Formatting of this note might be different from the original.  Active smoker  - 04/30/2021  After extensive coaching inhaler device,  effectiveness =    90% with smi > resume stiolto     Last Assessment & Plan:   Fo   Chronic pain syndrome 12/23/2021   Chronic sacroiliac joint pain (Bilateral) 02/26/2022   Chronic shoulder pain (6th area of Pain) (Bilateral) 01/08/2022   Chronic thoracic back pain (4th area of Pain) (Midline) (Bilateral) 01/08/2022   Cigarette smoker 05/01/2021   DDD (degenerative disc disease), cervical 12/23/2021   DDD (degenerative disc disease), lumbosacral 12/23/2021   (10/28/2021) LUMBAR MRI FINDINGS:  LEVELS:  There is disc desiccation without significant loss of height at L3-L4 and L4-L5. There is disc desiccation and mild narrowing at L1-L2, L2-L3, and L5-S1.  L1-2: central protrusion  L2-3: disc bulge  L3-4: disc bulge with a left foraminal component and annular fissure  L4-5: diffuse disc bulge with a central annular fissure  L5-S1: diffuse disc bulge with   DDD (degenerative disc disease), thoracic 12/23/2021   Depression    Disorder of skeletal system 12/23/2021   Elevated alkaline phosphatase level 02/26/2022   (01/08/2022)- Normal ALP (Alkaline phosphatase) levels are between 44 -121 IU/L, for our Lab. High ALP could suggest liver damage or increased bone cell activity. If other tests such as bilirubin, aspartate aminotransferase (AST), or alanine aminotransferase (ALT) are also high, usually the increased ALP is coming from the liver. Higher-than-normal ALP levels can be seen with: biliary obstruction;    Emphysema of lung (HCC)    Failure to attend appointment 10/20/2022   GERD (gastroesophageal reflux disease)    Grade 1 Retrolisthesis of cervical region (2 mm) (C4/C5) 12/23/2021   Grade 1 Retrolisthesis of L2/L3 and L3/L4 01/08/2022   (10/28/2021) LUMBAR MRI FINDINGS:  Alignment: Is trace retrolisthesis of L2 on L3 and L3 on L4.  LEVELS:  L2-3: There is  a mild disc bulge and mild facet arthropathy  L3-4: There is a mild disc bulge with a left foraminal component and annular fissure and mild left worse than right facet arthropathy resulting in narrowing of the left subarticular zone with crowding of the traversing nerve root, an   Headache disorder 09/20/2020   History of ileus 02/19/2021   History of marijuana use 02/26/2022   (01/08/2022) abnormal UDS (+) for carboxy-THC (marijuana)   The electronic medical record also indicated positive results on 10/09/2012 and 11/25/2013.   Leg pain    bilateral   Lumbar disc annular tears 01/08/2022   (10/28/2021) LUMBAR MRI FINDINGS:  LEVELS:  L3-4: There is a mild disc bulge with a left foraminal component and annular fissure  L4-5: There is a diffuse disc bulge with a central annular fissure   Lumbar facet syndrome (Bilateral) 01/08/2022   Lumbar lateral  recess stenosis (L3-4) (Left) 12/23/2021   Lumbar radiculopathy 08/01/2020   Lumbosacral facet arthropathy (Multilevel) (Bilateral) 12/23/2021   (10/28/2021) LUMBAR MRI FINDINGS:  LEVELS:  L2-3: mild facet arthropathy  L3-4: mild left worse than right facet arthropathy.  L4-5: bilateral facet arthropathy  L5-S1: bilateral facet arthropathy. There are trace bilateral facet joint effusions with mild perifacetal soft tissue edema at this level which could reflect a source of pain.     There is facet arthropathy most advanced at L4-L5 and L5-S   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) (Bilateral: L5-S1) 12/23/2021   (10/28/2021) LUMBAR MRI FINDINGS:  LEVELS:  L3-4: mild disc bulge with a left foraminal component resulting in mild left and no significant right neural foraminal stenosis. There is possible contact of the exiting left L3 nerve root by foraminal disc material.  L5-S1: diffuse disc bulge with bilateral foraminal components resulting in mild-to-moderate bilateral neural foraminal stenosis.   Lumbosacral lateral recess stenosis (L3-4, L5-S1) (Left)  01/08/2022   (10/28/2021) LUMBAR MRI FINDINGS:  LEVELS:  L3-4: narrowing of the left subarticular zone. There is possible contact of the exiting left L3 nerve root by foraminal disc material.  L5-S1: mild crowding of the left subarticular zone   Marijuana use 02/26/2022   (01/08/2022) abnormal UDS (+) for carboxy-THC (marijuana)    Migraines 07/02/2015   Mixed dyslipidemia 01/07/2023   Multiple pulmonary nodules determined by computed tomography of lung 05/01/2021   Active smoker  03/07/21 (vs 12/04/20) new subpleural  area of airspace consolidation within the anterior basal right upper  lobe abutting the major fissure. This area has a mean derived  - CT 07/17/2021 lesions have either resolved or now change c/w inflammatory etiology plus emphysema  > repeat 18 mplaced  in reminder file      Formatting of this note might be different from the original.  Formattin   Nicotine dependence, cigarettes, w unsp disorders 06/25/2011   Numbness and tingling in hands (Bilateral) 01/08/2022   Occipital headache (Bilateral) 01/08/2022   Other specified chronic obstructive pulmonary disease (HCC) 06/25/2011   Other spondylosis, cervical region (C4/C5 and C5/C6) 12/23/2021   Pharmacologic therapy 12/23/2021   Problems influencing health status 12/23/2021   PTSD (post-traumatic stress disorder) 04/05/2020   Rectal bleeding 02/19/2021   RLS (restless legs syndrome) 07/02/2015   Spondylosis without myelopathy or radiculopathy, lumbosacral region 02/26/2022   Stress incontinence 11/01/2021   T12 compression fracture, sequela 08/01/2020   Thoracic facet syndrome 01/08/2022   Upper airway cough syndrome 05/01/2021   Onset 2021   - cyclical cough rx  04/30/2021 >>>        Past Surgical History:  Procedure Laterality Date   ABDOMINAL HYSTERECTOMY     APPENDECTOMY  1986   BREAST SURGERY     breast surgey Left    Cyst Excision   CARPAL TUNNEL RELEASE     COLON SURGERY     COLOSTOMY REVISION  2014   EYE SURGERY      LEFT HEART CATH AND CORONARY ANGIOGRAPHY N/A 01/12/2023   Procedure: LEFT HEART CATH AND CORONARY ANGIOGRAPHY;  Surgeon: Avanell Leigh, MD;  Location: [facility];  Service: Cardiovascular;  Laterality: N/A;   SMALL INTESTINE SURGERY     TOTAL ABDOMINAL HYSTERECTOMY W/ BILATERAL SALPINGOOPHORECTOMY  2008   endometriosis   TUBAL LIGATION      Family Psychiatric History: Her mother has been hospitalized and attempted suicide in the past.   Family History:  Family History  Problem Relation Age  of Onset   Alcohol abuse Mother    Anxiety disorder Mother    Depression Mother    Early death Mother    Varicose Veins Mother    Alcohol abuse Father    Heart attack Father    Heart disease Brother    Heart attack Brother    Seizures Daughter    Heart attack Maternal Grandmother    Heart disease Maternal Grandmother    Heart attack Paternal Grandmother    Alcohol abuse Brother    Depression Brother    Early death Brother     Social History:  Social History   Socioeconomic History   Marital status: Divorced    Spouse name: Not on file   Number of children: 1   Years of education: HS   Highest education level: Some college, no degree  Occupational History   Occupation: Disabled  Tobacco Use   Smoking status: Every Day    Current packs/day: 3.00    Average packs/day: 3.0 packs/day for 42.4 years (127.2 ttl pk-yrs)    Types: Cigarettes, Pipe, Cigars    Start date: 1983    Passive exposure: Current   Smokeless tobacco: Never   Tobacco comments:    3.5-4pdd 04/30/2021  Vaping Use   Vaping status: Former  Substance and Sexual Activity   Alcohol use: Yes    Alcohol/week: 42.0 standard drinks of alcohol    Types: 42 Cans of beer per week   Drug use: Yes    Frequency: 7.0 times per week    Types: Marijuana   Sexual activity: Not Currently    Birth control/protection: Other-see comments    Comment: Menopausal Hysterectomy 2008  Other Topics Concern   Not on file   Social History Narrative   Lives at home alone.   Drinks two pots of coffee per day.   Right-handed.   Social Drivers of Corporate investment banker Strain: Low Risk  (08/05/2023)   Overall Financial Resource Strain (CARDIA)    Difficulty of Paying Living Expenses: Not hard at all  Recent Concern: Financial Resource Strain - High Risk (06/23/2023)   Overall Financial Resource Strain (CARDIA)    Difficulty of Paying Living Expenses: Very hard  Food Insecurity: No Food Insecurity (08/05/2023)   Hunger Vital Sign    Worried About Running Out of Food in the Last Year: Never true    Ran Out of Food in the Last Year: Never true  Transportation Needs: No Transportation Needs (08/05/2023)   PRAPARE - Administrator, Civil Service (Medical): No    Lack of Transportation (Non-Medical): No  Physical Activity: Sufficiently Active (08/05/2023)   Exercise Vital Sign    Days of Exercise per Week: 7 days    Minutes of Exercise per Session: 150+ min  Stress: Stress Concern Present (08/05/2023)   [employer] of Occupational Health - Occupational Stress Questionnaire    Feeling of Stress : Rather much  Social Connections: Socially Isolated (08/27/2023)   Social Connection and Isolation Panel [NHANES]    Frequency of Communication with Friends and Family: Once a week    Frequency of Social Gatherings with Friends and Family: Never    Attends Religious Services: Never    Database administrator or Organizations: No    Attends Engineer, structural: Not on file    Marital Status: Divorced    Allergies:  Allergies  Allergen Reactions   Penicillin G     Other Reaction(s): Not available, Not  available  Other Reaction(s): Not available   Penicillins Other (See Comments)    Unknown childhood reaction   Sulfa Antibiotics Swelling    Metabolic Disorder Labs: Lab Results  Component Value Date   HGBA1C 6.1 08/31/2023   No results found for: "PROLACTIN" Lab Results   Component Value Date   CHOL 186 08/31/2023   TRIG 99.0 08/31/2023   HDL 71.10 08/31/2023   CHOLHDL 3 08/31/2023   VLDL 19.8 08/31/2023   LDLCALC 95 08/31/2023   LDLCALC 95 11/01/2021   Lab Results  Component Value Date   TSH 3.42 08/31/2023   TSH 1.130 02/19/2021    Therapeutic Level Labs: No results found for: "LITHIUM" No results found for: "VALPROATE" No results found for: "CBMZ"  Current Medications: Current Outpatient Medications  Medication Sig Dispense Refill   albuterol  (PROVENTIL ) (2.5 MG/3ML) 0.083% nebulizer solution Take 3 mLs (2.5 mg total) by nebulization every 6 (six) hours as needed for wheezing or shortness of breath. 3 mL 0   Aspirin -Caffeine 845-65 MG PACK Take 6-7 each by mouth daily as needed (migraines). BC/ goodie powder 56 each    Budeson-Glycopyrrol-Formoterol  (BREZTRI  AEROSPHERE) 160-9-4.8 MCG/ACT AERO Inhale 2 puffs into the lungs in the morning and at bedtime. 3 each 3   budesonide -formoterol  (SYMBICORT ) 160-4.5 MCG/ACT inhaler Inhale 2 puffs into the lungs in the morning and at bedtime. 1 each 12   chlorproMAZINE  (THORAZINE ) 100 MG tablet Take 1 tablet (100 mg total) by mouth at bedtime. 30 tablet 0   fluticasone  (FLONASE ) 50 MCG/ACT nasal spray shake liquid AND INSTILL 1 SPRAY IN EACH NOSTRIL DAILY 16 g 2   Fluticasone -Umeclidin-Vilant (TRELEGY ELLIPTA ) 100-62.5-25 MCG/ACT AEPB Inhale 1 puff into the lungs daily. 3 each 3   Fluticasone -Umeclidin-Vilant (TRELEGY ELLIPTA ) 100-62.5-25 MCG/ACT AEPB Inhale 1 puff into the lungs daily. 28 each 0   gabapentin  (NEURONTIN ) 300 MG capsule Take 1 capsule (300 mg total) by mouth at bedtime. 30 capsule 0   lamoTRIgine  (LAMICTAL ) 25 MG tablet Take 2 tablets (50 mg total) by mouth daily. 30 tablet 1   meloxicam  (MOBIC ) 15 MG tablet Take 1 tablet (15 mg total) by mouth daily. 30 tablet 5   nitroGLYCERIN  (NITROSTAT ) 0.4 MG SL tablet Place 1 tablet (0.4 mg total) under the tongue every 5 (five) minutes as needed for  chest pain. Patient needs appointment for further refills. 1 st attempt 25 tablet 0   ondansetron  (ZOFRAN -ODT) 8 MG disintegrating tablet DISSOLVE 1 TABLET(8 MG) ON THE TONGUE EVERY 8 HOURS AS NEEDED FOR NAUSEA OR VOMITING 90 tablet 3   rOPINIRole  (REQUIP ) 4 MG tablet Take 1 tablet (4 mg total) by mouth 5 (five) times daily. 150 tablet 0   rosuvastatin  (CRESTOR ) 5 MG tablet Take 1 tablet (5 mg total) by mouth daily. 30 tablet 0   SUMAtriptan  (IMITREX ) 5 MG/ACT nasal spray Place one spray intranasally at onset of migraine. May repeat dose x 1 after 2 hours if headache persists. Max 40 mg/24 hours. 1 each 5   No current facility-administered medications for this visit.     Musculoskeletal: Strength & Muscle Tone: within normal limits Gait & Station: normal Patient leans: N/A  Psychiatric Specialty Exam: Review of Systems  Constitutional: Negative.   HENT: Negative.    Eyes: Negative.   Respiratory: Negative.    Cardiovascular: Negative.   Gastrointestinal: Negative.   Endocrine: Negative.   Genitourinary: Negative.   Musculoskeletal: Negative.   Skin: Negative.   Allergic/Immunologic: Negative.   Neurological: Negative.   Hematological:  Negative.     There were no vitals taken for this visit.There is no height or weight on file to calculate BMI.  General Appearance: Well Groomed  Eye Contact:  Good  Speech:  Clear and Coherent  Volume:  Normal  Mood:  Anxious and Depressed  Affect:  Depressed  Thought Process:  Coherent  Orientation:  Full (Time, Place, and Person)  Thought Content: Logical   Suicidal Thoughts:  No  Homicidal Thoughts:  No  Memory:  Immediate;   Good Recent;   Good Remote;   Good  Judgement:  Good  Insight:  Good  Psychomotor Activity:  Normal  Concentration:  Concentration: Good and Attention Span: Good  Recall:  Good  Fund of Knowledge: Good  Language: Good  Akathisia:  No  Handed:  Right  AIMS (if indicated):   Assets:  Desire for  Improvement Financial Resources/Insurance Housing  ADL's:  Intact  Cognition: WNL  Sleep:  Good   Screenings: AUDIT    Flowsheet Row Office Visit from 08/07/2023 in [facility] [employer] Most recent reading at 08/05/2023 10:43 AM Office Visit from 06/23/2023 in [facility] [employer] Most recent reading at 06/23/2023  5:01 AM Appointment from 07/23/2023 in [facility] [employer] Most recent reading at 06/04/2023  5:03 AM Appointment from 06/09/2023 in [facility] [employer] Most recent reading at 06/04/2023  5:03 AM  Alcohol Use Disorder Identification Test Final Score (AUDIT) 14  12  12  12        GAD-7    Flowsheet Row Office Visit from 01/26/2024 in [facility] Visit from 08/07/2023 in [facility] [employer] Visit from 06[facility]23/2023 in [facility] [employer] Visit from 02/06/2022 in [facility] Office Visit from 11/01/2021 in [facility]  Total GAD-7 Score 15 18 6 18 18       PHQ2-9    Flowsheet Row Office Visit from 01/26/2024 in [facility] Visit from 12/23/2023 in [facility] Office Visit from 08/07/2023 in [facility] Visit from 06/23/2023 in [facility] [employer] Visit from 02/26/2022 in [facility] Total Score 1 0 0 0 0  PHQ-9 Total Score 12 -- 9 7 --      Flowsheet Row Office Visit from 12/23/2023 in [facility]  C-SSRS RISK CATEGORY No Risk        Assessment and Plan:   Assessment - Diagnosis: PTSD (post-traumatic stress disorder) [F43.10]   2.  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 Bipolar 2 disorder, currently hypomanic (HCC) [F31.81]   Differential Diagnosis: 1. Primary Insomnia - Progress: Patient reported that she had a rough week this week stating that she had to cancel her appointment with Dr. Ziglar due to the frequency of appointments, feeling overwhelmed.  Patient also states she gets irritable easily irritated when she has accidents like bumping her head losing losing her keys. - Risk Factors: Suicidal risk, worsening symptoms  Plan - Medications:  Increase Lamictal  100mg  once a day, for bipolar, take in the AM.   Continue Thorazine  100mg  once a night, for PTSD and insomnia.  Patient has been educated on then importance of  taking medication prior to bed due to the sedating nature and has been instructed not to operate a vehicle or machinery with this medication.  Patient has been educated on akathisia, abnormal movements or tremors that are created by the medication to stop or call the clinic as well as galactorrhea or amenorrhea to notify the clinic should occur    - Psychotherapy: Declined local therapy referral and requested to be placed on wait list for a therapist within this office. - Education: Patient educated on therapy and the impact and effects of PTSD with therapy in which she is in agreement.  Patient reports she like to be to see a therapist within his office.  Patient educated on the medication Lunesta  of its uses as well as side effects and adverse reaction.  Patient educated on suicidal risk as well as having a firearm within the home to have a secured with ammunition separated as well as to call 911 or go to emergency department should she have suicidal thoughts with or without a plan. - Follow-Up: Follow-up in 1 week for medication monitoring and adjustment - Referrals: No referrals - Safety Planning:  The patient has been educated, if they should have suicidal thoughts with or without a  plan to call 911, or go to the closest emergency department.  Pt verbalized understanding.  Pt endorses having a firearm in her RV which she lives in for safety due to being single and living in an RV by herself with her dog.  Patient agrees to keep ammunition and firearm separated.  Pt also agrees to call the clinic should they have worsening symptoms before the next appointment.     Patient/Guardian was advised Release of Information must be obtained prior to any record release in order to collaborate their care with an outside provider. Patient/Guardian was advised if they have not already done so to contact the registration department to sign all necessary forms in order for us  to release information regarding their care.   Consent: Patient/Guardian gives verbal consent for treatment and assignment of benefits for services provided during this visit. Patient/Guardian expressed understanding and agreed to proceed.    [name], NP 02/05/2024, 8:30 AM

## 2024-02-12 ENCOUNTER — Telehealth (INDEPENDENT_AMBULATORY_CARE_PROVIDER_SITE_OTHER): Admitting: Psychiatry

## 2024-02-12 DIAGNOSIS — F5105 Insomnia due to other mental disorder: Secondary | ICD-10-CM | POA: Diagnosis not present

## 2024-02-12 DIAGNOSIS — F99 Mental disorder, not otherwise specified: Secondary | ICD-10-CM | POA: Diagnosis not present

## 2024-02-12 DIAGNOSIS — F4310 Post-traumatic stress disorder, unspecified: Secondary | ICD-10-CM

## 2024-02-12 DIAGNOSIS — F3181 Bipolar II disorder: Secondary | ICD-10-CM

## 2024-02-12 NOTE — Progress Notes (Signed)
 BH MD/PA/NP OP Progress Note  02/12/2024 9:01 AM Kim [name]  MRN:  [id number]  Chief Complaint: Routine follow-up  Virtual Visit via Video Note  I connected with [name] on 02/12/24 at  9:00 AM EDT by a video enabled telemedicine application and verified that I am speaking with the correct person using two identifiers.  Location: Patient: [name]  [name] [id number]  Provider: [facility] Office of provider   I discussed the limitations of evaluation and management by telemedicine and the availability of in person appointments. The patient expressed understanding and agreed to proceed.    I discussed the assessment and treatment plan with the patient. The patient was provided an opportunity to ask questions and all were answered. The patient agreed with the plan and demonstrated an understanding of the instructions.   The patient was advised to call back or seek an in-person evaluation if the symptoms worsen or if the condition fails to improve as anticipated.  I provided 20 minutes of non-face-to-face time during this encounter.   [name], NP    HPI: 78y female presenting to [facility] for follow-up appointment.  Pt presents stating that the medications have been difficult to get due to her insurance and current pharmacy.  Pt reports that medications that were filled were being "removed from her cart". At this time pt reports that she has enough medications for the treatment plan we have started.  When asked about sleep, pt reports that she is having different times every night but for the most part, her sleep has increased throughout the weeks as she started thorazine .  Pt has stated that she is getting ready to go on a trip to [place] , for about a week.  At this time pt reports to hold off medication changes due to the pharmacy being difficult.  Pt with no other questions or concerns at this time.  Pt is in agreement with treatment plan.  Pt denies  SI/HI/AVH.  Pt will follow-up in 1 week.  Visit Diagnosis:    ICD-10-CM   1. PTSD (post-traumatic stress disorder)  F43.10     2. Insomnia due to other mental disorder  F51.05    F99     3. Bipolar 2 disorder (HCC)  F31.81       Past Psychiatric History:  Previous Psych Hospitalizations: Last hospitalization on 2004 after the discovery of her husband cheating on her with her biological daughter.  Patient endorses being hospitalized 14 times since adolescence.   Psychiatric diagnosis history: Bipolar, paranoid schizophrenia, PTSD, major depressive disorder, anxiety.   Medications Current: -Gabapentin  300mg  once at bedtime -Thorazine  100mg  once daily at night -Lamictal  100mg  once a day   Next Steps: -Evaluate sleep -Consider increasing Lamictal    Medication Trials: - 01/07/24 Lunesta , poor response and side effects of "arm and leg jerking" - 01/02/24 Abilify , pt weight gain -Duloxetine - caused a colostomy due to gastric complications. -Trazodone-states significant side effects while taking therapeutic dose and stopped. -Patient reports many medications have been tried and failed but unable to recall patient states that if mentioned will notify if tried or not. Suicide & Violence: Patient currently denies SI, HI.  Paper should reports having a firearm in the home but has no thoughts of plan to harm herself.   Psychotherapy: Patient is open to therapy but will not seek therapy in the local community and is requesting to see [place] [facility] therapist.  In placed on wait list.  Legal: Denies    Past Medical History:  Past Medical History:  Diagnosis Date   Abnormal drug screen (01/08/2022 UDS) 02/26/2022   (01/08/2022) abnormal UDS (+) for carboxy-THC (marijuana)    Abnormal MRI, cervical spine (01/23/2022) 02/26/2022   (01/23/2022) MRI CERVICAL SPINE FINDINGS:  Alignment: Mild reversal the normal cervical lordosis.     DISC LEVELS:  C3-4: Mild bilateral facet arthropathy  C4-5: Minimal disc  bulge and mild bilateral facet arthropathy     IMPRESSION:  1. Mild multilevel degenerative change without significant canal or foraminal stenosis.   Abnormal MRI, lumbar spine (10/28/2021) 12/23/2021   (10/28/2021) LUMBAR MRI FINDINGS:  Alignment: Is trace retrolisthesis of L2 on L3 and L3 on L4, not significantly changed. Alignment is otherwise normal.  Vertebrae: There is anterior compression deformity of the T12 vertebral body with up to approximately 25% loss of vertebral body height anteriorly, not significantly changed since 2021. The other vertebral body heights are preserved. There is mi   Abnormal MRI, thoracic spine (01/23/2022) 02/26/2022   (01/23/2022) MRI THORACIC SPINE FINDINGS:  Alignment: Mildly exaggerated thoracic kyphosis at T11-T12 due to T12 fracture described below.  Vertebrae: Remote T12 compression fracture, unchanged from 10/28/2021 MRI      DISC LEVELS:  Mild disc bulge at T11-12      IMPRESSION:  1. Mild multilevel degenerative change without significant canal or foraminal stenosis.  2. Remote T12 fracture with similar    Allergy    Angina pectoris (HCC) 01/07/2023   Aortic atherosclerosis (HCC) 01/14/2021   Arthritis    Asthma    Atherosclerotic heart disease of native coronary artery without angina pectoris    Bilateral stenosis of lateral recess of lumbar spine    Bipolar disorder (HCC)    Carpal tunnel syndrome (Bilateral) 01/08/2022   Centrilobular emphysema (HCC) 02/21/2016   Formatting of this note might be different from the original.  Last Assessment & Plan:   Formatting of this note might be different from the original.  Ongoing. Not well controlled.  Saw Pulmonology whom she felt didn't help her cough.  Requesting new referral to see a different Pulmonologist.  Referral was placed today during the visit.   Cervical facet syndrome 01/08/2022   Cervical radiculopathy 08/01/2020   Cervicalgia 01/08/2022   Cervicogenic headache 01/08/2022   Chronic cough 03/16/2022    Chronic hip pain (2ry area of Pain) (Bilateral) (L>R) 01/08/2022   Chronic low back pain (1ry area of Pain) (Bilateral) (L>R) w/o sciatica 07/04/2021   Chronic lower extremity pain (3ry area of Pain) (Intermittent) (Left) 01/08/2022   Chronic neck and back pain (5th area of Pain) (Midline) 01/08/2022   Chronic obstructive pulmonary disease (HCC) 06/25/2011   Active smoker  - 04/30/2021  After extensive coaching inhaler device,  effectiveness =    90% with smi > resume stiolto     Formatting of this note might be different from the original.  Formatting of this note might be different from the original.  Active smoker  - 04/30/2021  After extensive coaching inhaler device,  effectiveness =    90% with smi > resume stiolto     Last Assessment & Plan:   Fo   Chronic pain syndrome 12/23/2021   Chronic sacroiliac joint pain (Bilateral) 02/26/2022   Chronic shoulder pain (6th area of Pain) (Bilateral) 01/08/2022   Chronic thoracic back pain (4th area of Pain) (Midline) (Bilateral) 01/08/2022   Cigarette smoker 05/01/2021   DDD (degenerative disc disease), cervical 12/23/2021   DDD (  degenerative disc disease), lumbosacral 12/23/2021   (10/28/2021) LUMBAR MRI FINDINGS:  LEVELS:  There is disc desiccation without significant loss of height at L3-L4 and L4-L5. There is disc desiccation and mild narrowing at L1-L2, L2-L3, and L5-S1.  L1-2: central protrusion  L2-3: disc bulge  L3-4: disc bulge with a left foraminal component and annular fissure  L4-5: diffuse disc bulge with a central annular fissure  L5-S1: diffuse disc bulge with   DDD (degenerative disc disease), thoracic 12/23/2021   Depression    Disorder of skeletal system 12/23/2021   Elevated alkaline phosphatase level 02/26/2022   (01/08/2022)- Normal ALP (Alkaline phosphatase) levels are between 44 -121 IU/L, for our Lab. High ALP could suggest liver damage or increased bone cell activity. If other tests such as bilirubin, aspartate aminotransferase  (AST), or alanine aminotransferase (ALT) are also high, usually the increased ALP is coming from the liver. Higher-than-normal ALP levels can be seen with: biliary obstruction;    Emphysema of lung (HCC)    Failure to attend appointment 10/20/2022   GERD (gastroesophageal reflux disease)    Grade 1 Retrolisthesis of cervical region (2 mm) (C4/C5) 12/23/2021   Grade 1 Retrolisthesis of L2/L3 and L3/L4 01/08/2022   (10/28/2021) LUMBAR MRI FINDINGS:  Alignment: Is trace retrolisthesis of L2 on L3 and L3 on L4.  LEVELS:  L2-3: There is a mild disc bulge and mild facet arthropathy  L3-4: There is a mild disc bulge with a left foraminal component and annular fissure and mild left worse than right facet arthropathy resulting in narrowing of the left subarticular zone with crowding of the traversing nerve root, an   Headache disorder 09/20/2020   History of ileus 02/19/2021   History of marijuana use 02/26/2022   (01/08/2022) abnormal UDS (+) for carboxy-THC (marijuana)   The electronic medical record also indicated positive results on 10/09/2012 and 11/25/2013.   Leg pain    bilateral   Lumbar disc annular tears 01/08/2022   (10/28/2021) LUMBAR MRI FINDINGS:  LEVELS:  L3-4: There is a mild disc bulge with a left foraminal component and annular fissure  L4-5: There is a diffuse disc bulge with a central annular fissure   Lumbar facet syndrome (Bilateral) 01/08/2022   Lumbar lateral recess stenosis (L3-4) (Left) 12/23/2021   Lumbar radiculopathy 08/01/2020   Lumbosacral facet arthropathy (Multilevel) (Bilateral) 12/23/2021   (10/28/2021) LUMBAR MRI FINDINGS:  LEVELS:  L2-3: mild facet arthropathy  L3-4: mild left worse than right facet arthropathy.  L4-5: bilateral facet arthropathy  L5-S1: bilateral facet arthropathy. There are trace bilateral facet joint effusions with mild perifacetal soft tissue edema at this level which could reflect a source of pain.     There is facet arthropathy most advanced at L4-L5  and L5-S   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) (Bilateral: L5-S1) 12/23/2021   (10/28/2021) LUMBAR MRI FINDINGS:  LEVELS:  L3-4: mild disc bulge with a left foraminal component resulting in mild left and no significant right neural foraminal stenosis. There is possible contact of the exiting left L3 nerve root by foraminal disc material.  L5-S1: diffuse disc bulge with bilateral foraminal components resulting in mild-to-moderate bilateral neural foraminal stenosis.   Lumbosacral lateral recess stenosis (L3-4, L5-S1) (Left) 01/08/2022   (10/28/2021) LUMBAR MRI FINDINGS:  LEVELS:  L3-4: narrowing of the left subarticular zone. There is possible contact of the exiting left L3 nerve root by foraminal disc material.  L5-S1: mild crowding of the left subarticular zone   Marijuana use 02/26/2022   (01/08/2022)  abnormal UDS (+) for carboxy-THC (marijuana)    Migraines 07/02/2015   Mixed dyslipidemia 01/07/2023   Multiple pulmonary nodules determined by computed tomography of lung 05/01/2021   Active smoker  03/07/21 (vs 12/04/20) new subpleural  area of airspace consolidation within the anterior basal right upper  lobe abutting the major fissure. This area has a mean derived  - CT 07/17/2021 lesions have either resolved or now change c/w inflammatory etiology plus emphysema  > repeat 18 mplaced  in reminder file      Formatting of this note might be different from the original.  Formattin   Nicotine dependence, cigarettes, w unsp disorders 06/25/2011   Numbness and tingling in hands (Bilateral) 01/08/2022   Occipital headache (Bilateral) 01/08/2022   Other specified chronic obstructive pulmonary disease (HCC) 06/25/2011   Other spondylosis, cervical region (C4/C5 and C5/C6) 12/23/2021   Pharmacologic therapy 12/23/2021   Problems influencing health status 12/23/2021   PTSD (post-traumatic stress disorder) 04/05/2020   Rectal bleeding 02/19/2021   RLS (restless legs syndrome) 07/02/2015   Spondylosis  without myelopathy or radiculopathy, lumbosacral region 02/26/2022   Stress incontinence 11/01/2021   T12 compression fracture, sequela 08/01/2020   Thoracic facet syndrome 01/08/2022   Upper airway cough syndrome 05/01/2021   Onset 2021   - cyclical cough rx  04/30/2021 >>>        Past Surgical History:  Procedure Laterality Date   ABDOMINAL HYSTERECTOMY     APPENDECTOMY  1986   BREAST SURGERY     breast surgey Left    Cyst Excision   CARPAL TUNNEL RELEASE     COLON SURGERY     COLOSTOMY REVISION  2014   EYE SURGERY     LEFT HEART CATH AND CORONARY ANGIOGRAPHY N/A 01/12/2023   Procedure: LEFT HEART CATH AND CORONARY ANGIOGRAPHY;  Surgeon: Avanell Leigh, MD;  Location: [facility];  Service: Cardiovascular;  Laterality: N/A;   SMALL INTESTINE SURGERY     TOTAL ABDOMINAL HYSTERECTOMY W/ BILATERAL SALPINGOOPHORECTOMY  2008   endometriosis   TUBAL LIGATION      Family Psychiatric History: Her mother has been hospitalized and attempted suicide in the past.   Family History:  Family History  Problem Relation Age of Onset   Alcohol abuse Mother    Anxiety disorder Mother    Depression Mother    Early death Mother    Varicose Veins Mother    Alcohol abuse Father    Heart attack Father    Heart disease Brother    Heart attack Brother    Seizures Daughter    Heart attack Maternal Grandmother    Heart disease Maternal Grandmother    Heart attack Paternal Grandmother    Alcohol abuse Brother    Depression Brother    Early death Brother     Social History:  Social History   Socioeconomic History   Marital status: Divorced    Spouse name: Not on file   Number of children: 1   Years of education: HS   Highest education level: Some college, no degree  Occupational History   Occupation: Disabled  Tobacco Use   Smoking status: Every Day    Current packs/day: 3.00    Average packs/day: 3.0 packs/day for 42.4 years (127.2 ttl pk-yrs)    Types: Cigarettes, Pipe,  Cigars    Start date: 1983    Passive exposure: Current   Smokeless tobacco: Never   Tobacco comments:    3.5-4pdd 04/30/2021  Vaping Use  Vaping status: Former  Substance and Sexual Activity   Alcohol use: Yes    Alcohol/week: 42.0 standard drinks of alcohol    Types: 42 Cans of beer per week   Drug use: Yes    Frequency: 7.0 times per week    Types: Marijuana   Sexual activity: Not Currently    Birth control/protection: Other-see comments    Comment: Menopausal Hysterectomy 2008  Other Topics Concern   Not on file  Social History Narrative   Lives at home alone.   Drinks two pots of coffee per day.   Right-handed.   Social Drivers of Corporate investment banker Strain: Low Risk  (08/05/2023)   Overall Financial Resource Strain (CARDIA)    Difficulty of Paying Living Expenses: Not hard at all  Recent Concern: Financial Resource Strain - High Risk (06/23/2023)   Overall Financial Resource Strain (CARDIA)    Difficulty of Paying Living Expenses: Very hard  Food Insecurity: No Food Insecurity (08/05/2023)   Hunger Vital Sign    Worried About Running Out of Food in the Last Year: Never true    Ran Out of Food in the Last Year: Never true  Transportation Needs: No Transportation Needs (08/05/2023)   PRAPARE - Administrator, Civil Service (Medical): No    Lack of Transportation (Non-Medical): No  Physical Activity: Sufficiently Active (08/05/2023)   Exercise Vital Sign    Days of Exercise per Week: 7 days    Minutes of Exercise per Session: 150+ min  Stress: Stress Concern Present (08/05/2023)   [employer] of Occupational Health - Occupational Stress Questionnaire    Feeling of Stress : Rather much  Social Connections: Socially Isolated (08/27/2023)   Social Connection and Isolation Panel [NHANES]    Frequency of Communication with Friends and Family: Once a week    Frequency of Social Gatherings with Friends and Family: Never    Attends Religious  Services: Never    Database administrator or Organizations: No    Attends Engineer, structural: Not on file    Marital Status: Divorced    Allergies:  Allergies  Allergen Reactions   Penicillin G     Other Reaction(s): Not available, Not available  Other Reaction(s): Not available   Penicillins Other (See Comments)    Unknown childhood reaction   Sulfa Antibiotics Swelling    Metabolic Disorder Labs: Lab Results  Component Value Date   HGBA1C 6.1 08/31/2023   No results found for: "PROLACTIN" Lab Results  Component Value Date   CHOL 186 08/31/2023   TRIG 99.0 08/31/2023   HDL 71.10 08/31/2023   CHOLHDL 3 08/31/2023   VLDL 19.8 08/31/2023   LDLCALC 95 08/31/2023   LDLCALC 95 11/01/2021   Lab Results  Component Value Date   TSH 3.42 08/31/2023   TSH 1.130 02/19/2021    Therapeutic Level Labs: No results found for: "LITHIUM" No results found for: "VALPROATE" No results found for: "CBMZ"  Current Medications: Current Outpatient Medications  Medication Sig Dispense Refill   albuterol  (PROVENTIL ) (2.5 MG/3ML) 0.083% nebulizer solution Take 3 mLs (2.5 mg total) by nebulization every 6 (six) hours as needed for wheezing or shortness of breath. 3 mL 0   Aspirin -Caffeine 845-65 MG PACK Take 6-7 each by mouth daily as needed (migraines). BC/ goodie powder 56 each    Budeson-Glycopyrrol-Formoterol  (BREZTRI  AEROSPHERE) 160-9-4.8 MCG/ACT AERO Inhale 2 puffs into the lungs in the morning and at bedtime. 3 each 3   budesonide -formoterol  (  SYMBICORT ) 160-4.5 MCG/ACT inhaler Inhale 2 puffs into the lungs in the morning and at bedtime. 1 each 12   chlorproMAZINE  (THORAZINE ) 100 MG tablet Take 1 tablet (100 mg total) by mouth 3 (three) times daily as needed. 30 tablet 2   fluticasone  (FLONASE ) 50 MCG/ACT nasal spray shake liquid AND INSTILL 1 SPRAY IN EACH NOSTRIL DAILY 16 g 2   Fluticasone -Umeclidin-Vilant (TRELEGY ELLIPTA ) 100-62.5-25 MCG/ACT AEPB Inhale 1 puff into the  lungs daily. 3 each 3   Fluticasone -Umeclidin-Vilant (TRELEGY ELLIPTA ) 100-62.5-25 MCG/ACT AEPB Inhale 1 puff into the lungs daily. 28 each 0   gabapentin  (NEURONTIN ) 300 MG capsule Take 1 capsule (300 mg total) by mouth at bedtime. 30 capsule 0   lamoTRIgine  (LAMICTAL ) 100 MG tablet Take 1 tablet (100 mg total) by mouth daily. 30 tablet 2   meloxicam  (MOBIC ) 15 MG tablet Take 1 tablet (15 mg total) by mouth daily. 30 tablet 5   nitroGLYCERIN  (NITROSTAT ) 0.4 MG SL tablet Place 1 tablet (0.4 mg total) under the tongue every 5 (five) minutes as needed for chest pain. Patient needs appointment for further refills. 1 st attempt 25 tablet 0   ondansetron  (ZOFRAN -ODT) 8 MG disintegrating tablet DISSOLVE 1 TABLET(8 MG) ON THE TONGUE EVERY 8 HOURS AS NEEDED FOR NAUSEA OR VOMITING 90 tablet 3   rOPINIRole  (REQUIP ) 4 MG tablet Take 1 tablet (4 mg total) by mouth 5 (five) times daily. 150 tablet 0   rosuvastatin  (CRESTOR ) 5 MG tablet Take 1 tablet (5 mg total) by mouth daily. 30 tablet 0   SUMAtriptan  (IMITREX ) 5 MG/ACT nasal spray Place one spray intranasally at onset of migraine. May repeat dose x 1 after 2 hours if headache persists. Max 40 mg/24 hours. 1 each 5   No current facility-administered medications for this visit.     Musculoskeletal: Strength & Muscle Tone: within normal limits Gait & Station: normal Patient leans: N/A  Psychiatric Specialty Exam: Review of Systems  Constitutional: Negative.   HENT: Negative.    Eyes: Negative.   Respiratory: Negative.    Cardiovascular: Negative.   Gastrointestinal: Negative.   Endocrine: Negative.   Genitourinary: Negative.   Musculoskeletal: Negative.   Skin: Negative.   Allergic/Immunologic: Negative.   Neurological: Negative.   Hematological: Negative.   Psychiatric/Behavioral:  Positive for dysphoric mood.     There were no vitals taken for this visit.There is no height or weight on file to calculate BMI.  General Appearance: Well  Groomed  Eye Contact:  Good  Speech:  Clear and Coherent  Volume:  Normal  Mood:  Dysphoric  Affect:  Congruent  Thought Process:  Coherent  Orientation:  Full (Time, Place, and Person)  Thought Content: Logical   Suicidal Thoughts:  No  Homicidal Thoughts:  No  Memory:  Immediate;   Good Recent;   Good Remote;   Good  Judgement:  Good  Insight:  Good  Psychomotor Activity:  Normal  Concentration:  Concentration: Good  Recall:  Good  Fund of Knowledge: Good  Language: Good  Akathisia:  No  Handed:  Right  AIMS (if indicated):   Assets:  Financial Resources/Insurance Housing Social Support  ADL's:  Intact  Cognition: WNL  Sleep:  Fair   Screenings: AUDIT    Flowsheet Row Office Visit from 08/07/2023 in [facility] [employer] Most recent reading at 08/05/2023 10:43 AM Office Visit from 06/23/2023 in [facility] [employer] Most recent reading at 06/23/2023  5:01 AM Appointment  from 07/23/2023 in [facility] Most recent reading at 06/04/2023  5:03 AM Appointment from 06/09/2023 in [facility] Most recent reading at 06/04/2023  5:03 AM  Alcohol Use Disorder Identification Test Final Score (AUDIT) 14  12  12  12        GAD-7    Flowsheet Row Video Visit from 02/05/2024 in [facility] Office Visit from 01/26/2024 in [facility] Visit from 08/07/2023 in [facility] Visit from 06/23/2023 in [facility] [employer] Visit from 02/06/2022 in [facility]  Total GAD-7 Score 14 15 18 6 18       PHQ2-9    Flowsheet Row Video Visit from 02/05/2024 in [facility] Office Visit from 01/26/2024 in [facility] at [facility] Visit from 12/23/2023 in [facility] Office Visit from 08/07/2023 in [facility] Visit from 06/23/2023 in [facility] Total Score 1 1 0 0 0  PHQ-9 Total Score -- 12 -- 9 7      Flowsheet Row Video Visit from 02/05/2024 in [facility] Office Visit from 12/23/2023 in [facility]  C-SSRS RISK CATEGORY No Risk No Risk        Assessment and Plan:  Assessment - Diagnosis: PTSD (post-traumatic stress disorder) [F43.10]  2.  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 Bipolar 2 disorder, currently hypomanic (HCC) [F31.81]   Differential Diagnosis: 1. Primary Insomnia - Progress: Pt continue to improve stating that her sleeping has been different but overall improving in duration. Pt also reporting she is planning a trip to [place] .  - Risk Factors: Suicidal risk, worsening symptoms  Plan - Medications:  Continue Lamictal  100mg  once a day, for bipolar, take in the AM.   Continue Thorazine  100mg  once a night, for PTSD and insomnia.  Patient has been educated on then importance of taking medication prior to bed due to the sedating nature and has been instructed not to operate a vehicle or machinery with this medication.  Patient has been educated on akathisia, abnormal movements or tremors that are created by the medication to stop or call the clinic as well as galactorrhea or amenorrhea to notify the clinic should occur    - Psychotherapy: Declined local therapy referral and requested to be placed on wait list for a therapist within this office. - Education: Patient educated on therapy and the impact and effects of PTSD with therapy in which she is in agreement.  Patient reports she like to be to see a therapist within his office.  Patient educated on the  medication Lunesta  of its uses as well as side effects and adverse reaction.  Patient educated on suicidal risk as well as having a firearm within the home to have a secured with ammunition separated as well as to call 911 or go to emergency department should she have suicidal thoughts with or without a plan. - Follow-Up: Follow-up in 1 week for medication monitoring and adjustment - Referrals: No referrals - Safety Planning:  The patient has been educated, if they should have suicidal thoughts with or without a plan to call 911, or go to the closest emergency department.  Pt verbalized understanding.  Pt endorses having a firearm in her RV which she lives in for safety due to being single and living in an RV by herself with her dog.  Patient agrees to keep ammunition and firearm separated.  Pt also agrees to call the clinic should they have worsening symptoms before the next appointment.     Patient/Guardian was advised Release of Information must be obtained prior to any record release in order to collaborate their care with an outside provider. Patient/Guardian was advised if they have not already done so to contact the registration department to sign all necessary forms in order for us  to release information regarding their care.   Consent: Patient/Guardian gives verbal consent for treatment and assignment of benefits for services provided during this visit. Patient/Guardian expressed understanding and agreed to proceed.    [name], NP 02/12/2024, 9:01 AM

## 2024-02-17 ENCOUNTER — Encounter

## 2024-02-17 ENCOUNTER — Encounter: Payer: Self-pay | Admitting: Pulmonary Disease

## 2024-02-17 ENCOUNTER — Ambulatory Visit: Admitting: Pulmonary Disease

## 2024-02-17 ENCOUNTER — Other Ambulatory Visit: Payer: Self-pay

## 2024-02-17 DIAGNOSIS — R0602 Shortness of breath: Secondary | ICD-10-CM

## 2024-02-19 ENCOUNTER — Telehealth: Admitting: Psychiatry

## 2024-02-19 DIAGNOSIS — F5105 Insomnia due to other mental disorder: Secondary | ICD-10-CM | POA: Diagnosis not present

## 2024-02-19 DIAGNOSIS — F3181 Bipolar II disorder: Secondary | ICD-10-CM

## 2024-02-19 DIAGNOSIS — F4310 Post-traumatic stress disorder, unspecified: Secondary | ICD-10-CM | POA: Diagnosis not present

## 2024-02-19 DIAGNOSIS — F99 Mental disorder, not otherwise specified: Secondary | ICD-10-CM | POA: Diagnosis not present

## 2024-02-19 MED ORDER — LAMOTRIGINE 200 MG PO TABS
200.0000 mg | ORAL_TABLET | Freq: Every day | ORAL | 1 refills | Status: DC
Start: 2024-02-19 — End: 2024-05-27

## 2024-02-19 NOTE — Progress Notes (Signed)
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 should she have suicidal thoughts with or without a plan. - Follow-Up: Follow-up in 2 week for medication monitoring and adjustment - Referrals: No referrals - Safety Planning:  The patient has been educated, if they should have suicidal thoughts with or without a plan to call 911, or go to the closest emergency department.  Pt verbalized understanding.  Pt endorses having a firearm in her RV which she lives in for safety due to being single and  living in an RV by herself with her dog.  Patient agrees to keep ammunition and firearm separated.  Pt also agrees to call the clinic should they have worsening symptoms before the next appointment.  Patient/Guardian was advised Release of Information must be obtained prior to any record release in order to collaborate their care with an outside provider. Patient/Guardian was advised if they have not already done so to contact the registration department to sign all necessary forms in order for us  to release information regarding their care.   Consent: Patient/Guardian gives verbal consent for treatment and assignment of benefits for services provided during this visit. Patient/Guardian expressed understanding and agreed to proceed.   This office note has been dictated. This dictation was prepared using Air traffic controller. As a result, errors may occur. When identified, these errors have been corrected. While every attempt is made to correct errors during dictation, errors may still exist.    [name], NP 02/19/2024, 8:33 AM

## 2024-02-22 ENCOUNTER — Telehealth: Payer: Self-pay

## 2024-02-22 NOTE — Telephone Encounter (Signed)
 left message notifiying pharmacy

## 2024-02-22 NOTE — Telephone Encounter (Signed)
 received fax requesting a refill on the aripiprazole . pt was last seen on 6-20 next appt 11-20

## 2024-03-01 ENCOUNTER — Ambulatory Visit: Payer: Medicaid Other | Admitting: Nurse Practitioner

## 2024-03-04 ENCOUNTER — Telehealth: Admitting: Psychiatry

## 2024-03-11 ENCOUNTER — Telehealth (INDEPENDENT_AMBULATORY_CARE_PROVIDER_SITE_OTHER): Admitting: Psychiatry

## 2024-03-11 DIAGNOSIS — F5105 Insomnia due to other mental disorder: Secondary | ICD-10-CM

## 2024-03-11 DIAGNOSIS — F4310 Post-traumatic stress disorder, unspecified: Secondary | ICD-10-CM | POA: Diagnosis not present

## 2024-03-11 DIAGNOSIS — F99 Mental disorder, not otherwise specified: Secondary | ICD-10-CM

## 2024-03-11 DIAGNOSIS — F3181 Bipolar II disorder: Secondary | ICD-10-CM | POA: Diagnosis not present

## 2024-03-11 IMAGING — MR MR THORACIC SPINE W/O CM
6 series · 29 of 48 positions shown · non-contrast
Comparison: MRI lumbar spine October 28, 2021. Cervical
radiographs January 09, 2022.

CLINICAL DATA: Osteoarthritis, thoracic Mid-back pain; Neck pain,
chronic Neck pain, chronic, degenerative changes on xray Cervical
radiculopathy, no red flags Cervicalgia

EXAM:
MRI CERVICAL AND THORACIC SPINE WITHOUT CONTRAST
TECHNIQUE: Multiplanar and multiecho pulse sequences of the cervical spine, to
include the craniocervical junction and cervicothoracic junction,
and the thoracic spine, were obtained without intravenous contrast.

[Series 16: T1 · sagittal · 5.0mm · 1.88mm/px · 2 of 9 slices shown (1 of 2)]
[im 1/9]
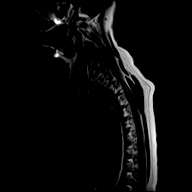
[im 9/9]
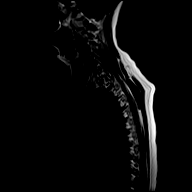

[Series 17: T2 · sagittal · 3.0mm · 1.06mm/px · 6 of 17 slices shown (1 of 2)]
[im 1/17]
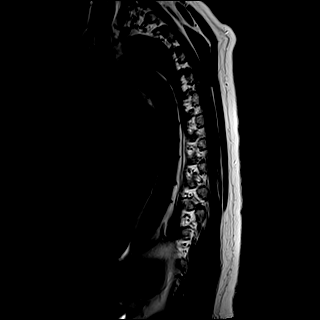
[im 4/17]
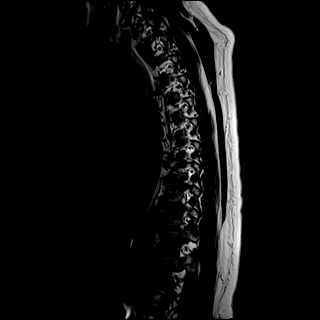
[im 7/17]
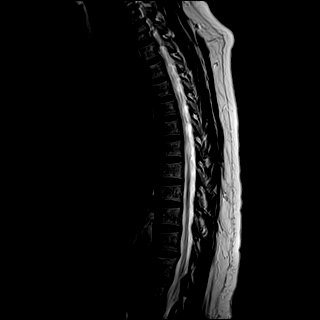
[im 10/17]
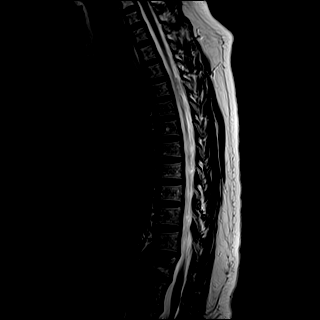
[im 13/17]
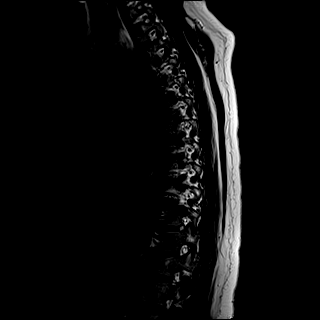
[im 17/17]
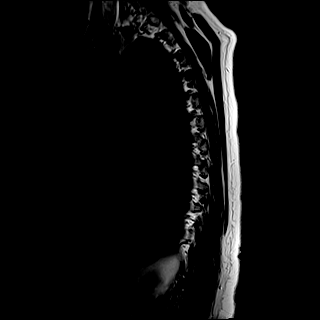

[Series 18: T1 · sagittal · 3.0mm · 1.06mm/px · 6 of 17 slices shown (2 of 2)]
[im 1/17]
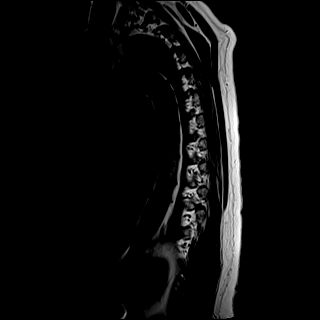
[im 4/17]
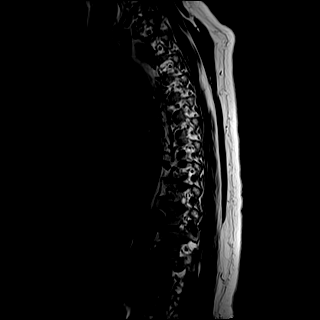
[im 7/17]
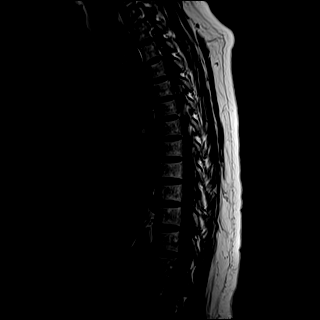
[im 10/17]
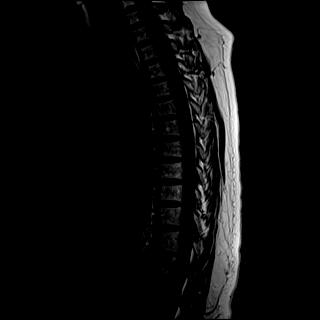
[im 13/17]
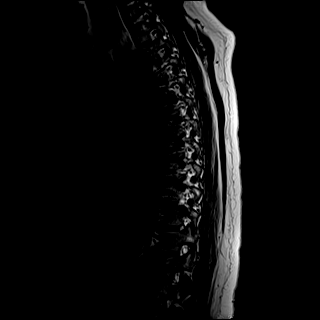
[im 17/17]
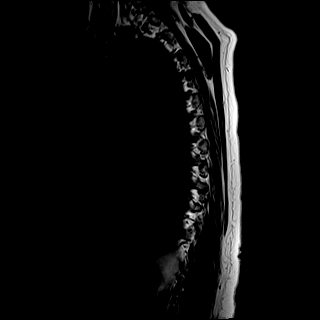

[Series 19: STIR · sagittal · 3.0mm · 0.53mm/px · 6 of 17 slices shown]
[im 1/17]
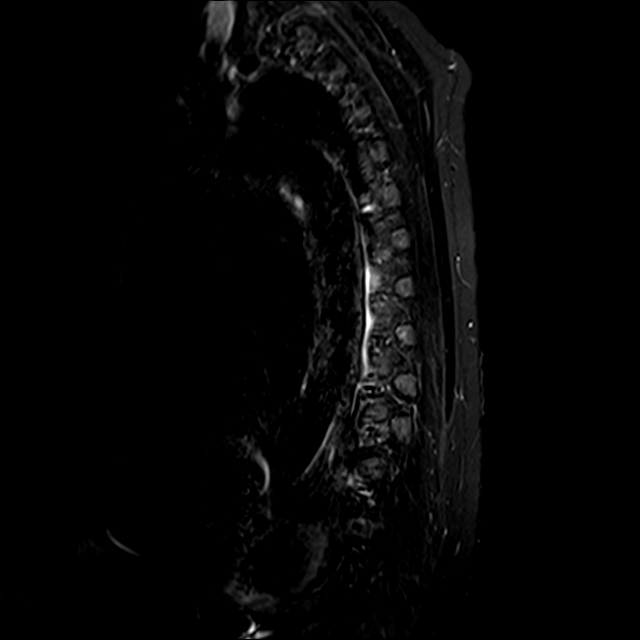
[im 4/17]
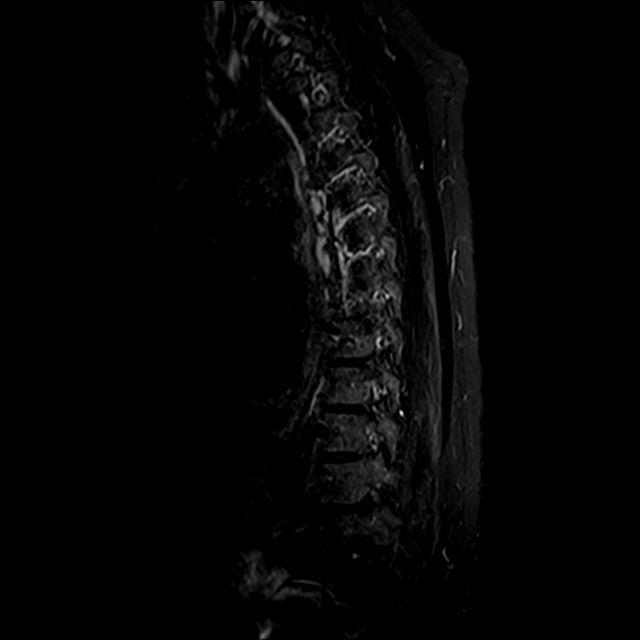
[im 7/17]
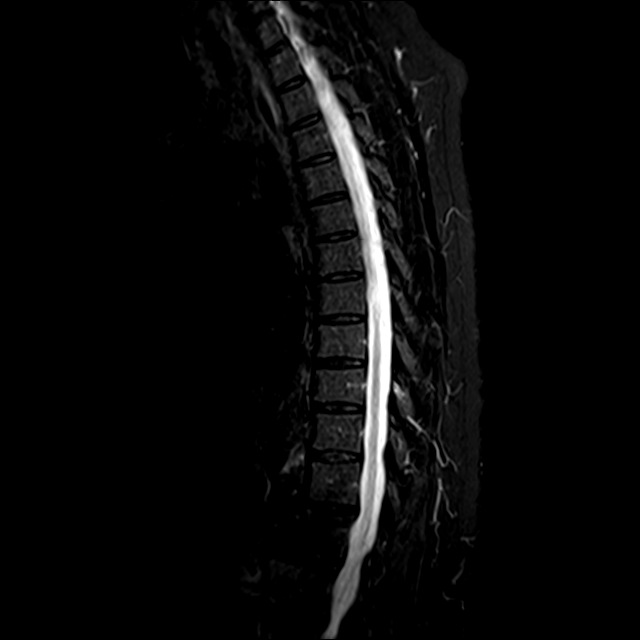
[im 10/17]
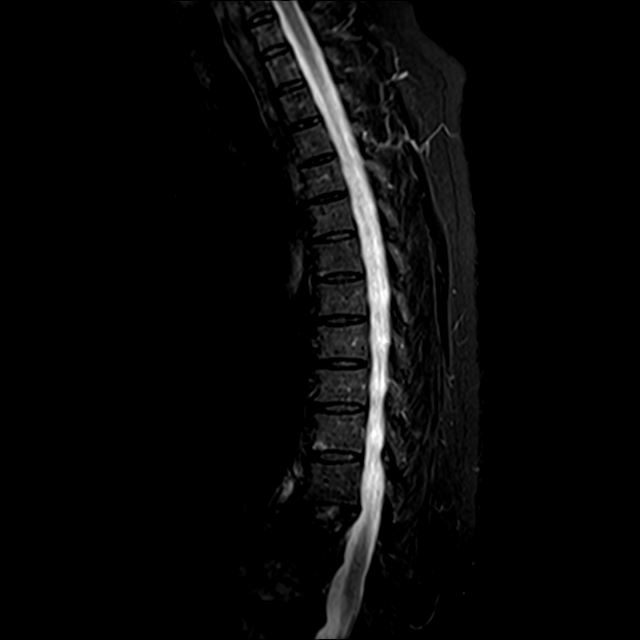
[im 13/17]
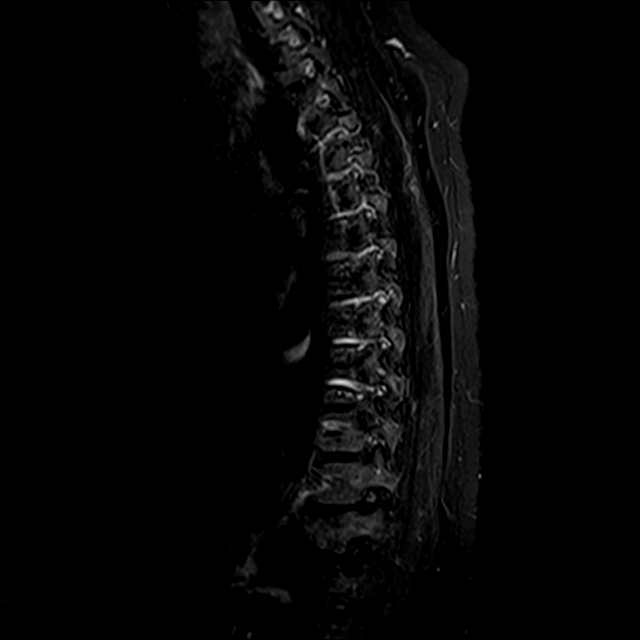
[im 17/17]
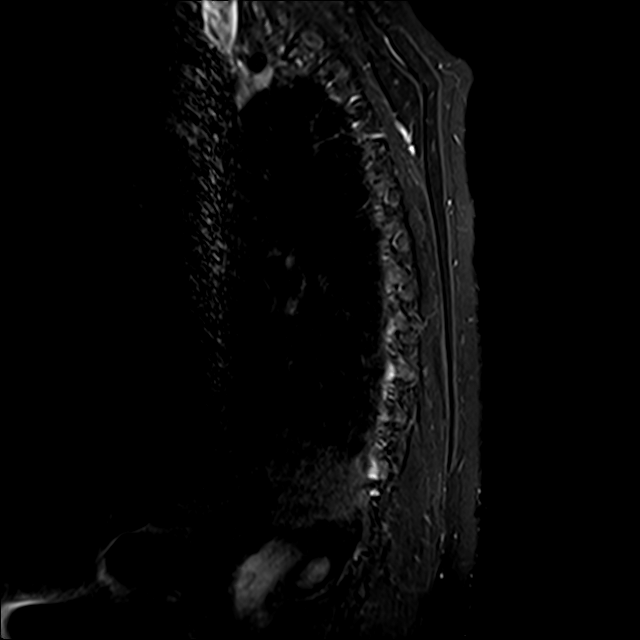

[Series 20: T2 · axial · 4.0mm · 0.59mm/px · z∈[-346,-117]mm · 8 of 40 slices shown (2 of 2)]
[im 1/40]
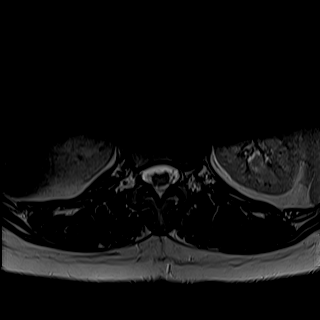
[im 7/40]
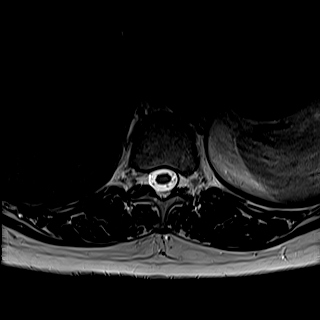
[im 13/40]
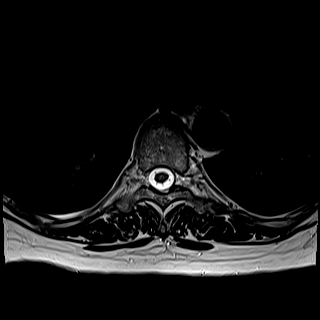
[im 19/40]
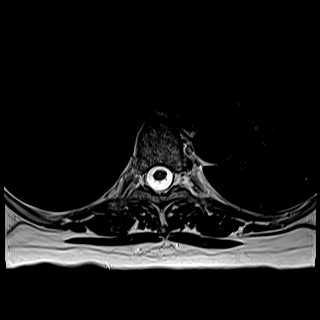
[im 22/40]
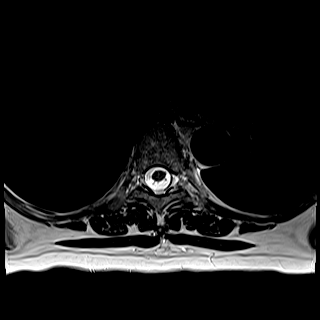
[im 28/40]
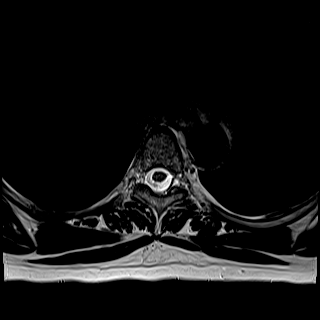
[im 34/40]
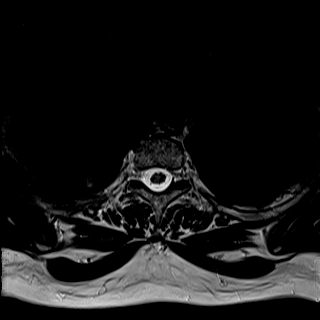
[im 40/40]
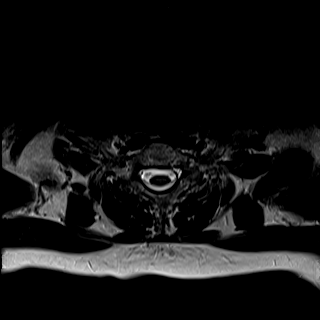

[Series 21: GRE · axial · 4.0mm · 0.37mm/px · 1 of 40 slices shown]
[im 1/40]
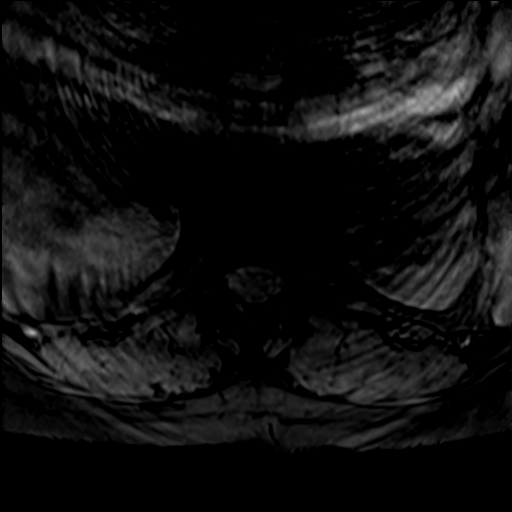

[29 of 48 positions shown; findings below may reference images not displayed]

FINDINGS: MRI CERVICAL SPINE FINDINGS

Alignment: Mild reversal the normal cervical lordosis.

Vertebrae: Vertebral body heights are maintained. No focal marrow
edema to suggest acute fracture discitis/osteomyelitis. No
suspicious bone lesions.

Cord: Normal cord signal.

Posterior Fossa, vertebral arteries, paraspinal tissues: Visualized
vertebral artery flow voids are maintained. Unremarkable appearance
of the partially visualized posterior fossa. No paraspinal edema.

Disc levels:

C2-C3: No significant disc protrusion, foraminal stenosis, or canal
stenosis.

C3-C4: Mild bilateral facet arthropathy without significant
stenosis.

C4-C5: Minimal disc bulge and mild bilateral facet arthropathy
without significant stenosis.

C5-C6: No significant disc protrusion, foraminal stenosis, or canal
stenosis.

C6-C7: No significant disc protrusion, foraminal stenosis, or canal
stenosis.

C7-T1: No significant disc protrusion, foraminal stenosis, or canal
stenosis.

MRI THORACIC SPINE FINDINGS

Alignment: Mildly exaggerated thoracic kyphosis at T11-T12 due to
T12 fracture described below. Otherwise, normal alignment.

Vertebrae: Remote T12 compression fracture, unchanged in comparison
to MRI of the lumbar spine from October 28, 21. no specific
evidence of acute fracture or discitis/osteomyelitis. No suspicious
bone lesions.

Cord:  Normal cord signal and morphology.

Paraspinal and other soft tissues: Unremarkable.

Disc levels:

Mild disc bulge at T11-T12 without significant stenosis. Otherwise,
no significant disc protrusion, foraminal stenosis, or canal
stenosis.
IMPRESSION: 1. Mild multilevel degenerative change without significant canal or
foraminal stenosis.
2. Remote T12 fracture with similar height loss.

## 2024-03-11 NOTE — Progress Notes (Signed)
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 patient has been educated, if they should have suicidal thoughts with or without a plan to call 911, or go to the closest emergency department.  Pt verbalized understanding.  Pt endorses having a firearm in her RV which she lives in for safety due to being single and living in an RV by herself with her dog.  Patient agrees to keep ammunition and firearm separated.  Pt also agrees to call the  clinic should they have worsening symptoms before the next appointment.  Patient/Guardian was advised Release of Information must be obtained prior to any record release in order to collaborate their care with an outside provider. Patient/Guardian was advised if they have not already done so to contact the registration department to sign all necessary forms in order for us  to release information regarding their care.   Consent: Patient/Guardian gives verbal consent for treatment and assignment of benefits for services provided during this visit. Patient/Guardian expressed understanding and agreed to proceed.    [name], NP 03/11/2024, 8:27 AM

## 2024-03-31 ENCOUNTER — Encounter: Payer: Self-pay | Admitting: Family Medicine

## 2024-03-31 ENCOUNTER — Ambulatory Visit: Admitting: Family Medicine

## 2024-03-31 VITALS — BP 121/79 | HR 61 | Temp 97.7°F | Resp 18 | Ht 67.0 in | Wt 209.0 lb

## 2024-03-31 DIAGNOSIS — R748 Abnormal levels of other serum enzymes: Secondary | ICD-10-CM | POA: Diagnosis not present

## 2024-03-31 DIAGNOSIS — E782 Mixed hyperlipidemia: Secondary | ICD-10-CM

## 2024-03-31 DIAGNOSIS — R7989 Other specified abnormal findings of blood chemistry: Secondary | ICD-10-CM | POA: Diagnosis not present

## 2024-03-31 DIAGNOSIS — R030 Elevated blood-pressure reading, without diagnosis of hypertension: Secondary | ICD-10-CM

## 2024-03-31 DIAGNOSIS — Z1231 Encounter for screening mammogram for malignant neoplasm of breast: Secondary | ICD-10-CM | POA: Diagnosis not present

## 2024-03-31 NOTE — Assessment & Plan Note (Signed)
 She is taking Crestor  5 mg a day.  Need to check her fasting cholesterol panel.  She will return fasting and have this done.

## 2024-03-31 NOTE — Assessment & Plan Note (Signed)
 She agrees to get a mammogram as she has not had one in a number of years.

## 2024-03-31 NOTE — Assessment & Plan Note (Signed)
 Blood pressure today is 121/79.  She has been checking her blood pressures at home and the vast majority are under 120/80.  Do not believe she has hypertension.

## 2024-03-31 NOTE — Progress Notes (Signed)
 Established Patient Office Visit  Subjective   Patient ID: Kim [name], female    DOB: 1966-06-29  Age: 58 y.o. MRN: [id number]  Chief Complaint  Patient presents with   Medical Management of Chronic Issues    HPI Delightful 58 year old woman with PTSD, AUD, pulmonary nodules, emphysema/COPD, chronic pain syndromes(DDD cervical and lumbar spines, numerous joint and tendonopathies)  migraines, RLS, mixed hyperlipidemia, Lumbar radiculopathy bilateral, Left ulnar impaction syndrome, PTSD/bipolar D/O, insomnia, (Followed by Psychiatry), s/p hemicolectomy (secondary to diverticulitis), s/p colostomy and reversal x 2.   Discussed the use of AI scribe software for clinical note transcription with the patient, who gave verbal consent to proceed.  History of Present Illness   Kim [name] is a 58 year old female who presents for a follow-up on her blood pressure management.  Her blood pressure readings at home vary, with some readings as high as 155/103 mmHg. She does not check her blood pressure daily but does so when experiencing discomfort. Vast majority of her BP readings are below 120/80.    She experiences significant swelling in both legs, which occurs regardless of the weather. She engages in a lot of walking due to back pain, which makes sitting uncomfortable. She uses Salonpas patches on her hands but finds them ineffective for her back pain.  She has a history of a total hysterectomy performed in June 2008 due to endometriosis. She had her tubes stapled at age 62 and had ovarian cysts removed prior to the hysterectomy. She has not had a Pap smear since the hysterectomy and is open to scheduling a mammogram, although she has not had one in a long time.  She states that she will never have another colonoscopy.  She lives in an RV with a large deck that she built herself after losing her house due to increased rent. She drinks a lot of water and engages in a lot  of walking due to back pain. She reports a history of elevated liver enzymes, which was communicated to her by her back doctor at a pain clinic.         Objective:     BP 121/79 (BP Location: Left Arm, Patient Position: Sitting, Cuff Size: Normal)   Pulse 61   Temp 97.7 F (36.5 C) (Oral)   Resp 18   Ht 5' 7 (1.702 m)   Wt 209 lb (94.8 kg)   SpO2 95%   BMI 32.73 kg/m    Physical Exam Vitals and nursing note reviewed.  Constitutional:      Appearance: Normal appearance.  HENT:     Head: Normocephalic and atraumatic.  Eyes:     Conjunctiva/sclera: Conjunctivae normal.  Cardiovascular:     Rate and Rhythm: Normal rate and regular rhythm.  Pulmonary:     Effort: Pulmonary effort is normal.     Breath sounds: Normal breath sounds.  Musculoskeletal:     Right lower leg: No edema.     Left lower leg: No edema.  Skin:    General: Skin is warm and dry.  Neurological:     Mental Status: She is alert and oriented to person, place, and time.  Psychiatric:        Mood and Affect: Mood normal.        Behavior: Behavior normal.        Thought Content: Thought content normal.        Judgment: Judgment normal.          No  results found for any visits on 03/31/24.    The 10-year ASCVD risk score (Arnett DK, et al., 2019) is: 3.8%    Assessment & Plan:  Elevated blood pressure reading Assessment & Plan: Blood pressure today is 121/79.  She has been checking her blood pressures at home and the vast majority are under 120/80.  Do not believe she has hypertension.  Orders: -     CBC with Differential/Platelet; Future -     TSH + free T4; Future  Mixed hyperlipidemia -     Comprehensive metabolic panel with GFR; Future -     Lipid panel; Future  Elevated LFTs -     Comprehensive metabolic panel with GFR; Future  Mixed dyslipidemia Assessment & Plan: She is taking Crestor  5 mg a day.  Need to check her fasting cholesterol panel.  She will return fasting and have  this done.   Elevated liver enzymes Assessment & Plan: She has a history of elevated liver enzymes.  Will check CMP.  Will workup causes of elevated liver enzymes including infectious, storage, autoimmune.  And will check a liver ultrasound.   Encounter for screening mammogram for malignant neoplasm of breast Assessment & Plan: She agrees to get a mammogram as she has not had one in a number of years.  Orders: -     3D Screening Mammogram, Left and Right; Future     Return in about 3 months (around 07/01/2024).    Adianna Darwin K Fahmida Jurich, MD

## 2024-03-31 NOTE — Assessment & Plan Note (Signed)
 She has a history of elevated liver enzymes.  Will check CMP.  Will workup causes of elevated liver enzymes including infectious, storage, autoimmune.  And will check a liver ultrasound.

## 2024-04-01 ENCOUNTER — Ambulatory Visit

## 2024-04-04 ENCOUNTER — Ambulatory Visit

## 2024-04-04 DIAGNOSIS — E782 Mixed hyperlipidemia: Secondary | ICD-10-CM | POA: Diagnosis not present

## 2024-04-04 DIAGNOSIS — R030 Elevated blood-pressure reading, without diagnosis of hypertension: Secondary | ICD-10-CM | POA: Diagnosis not present

## 2024-04-04 DIAGNOSIS — R7989 Other specified abnormal findings of blood chemistry: Secondary | ICD-10-CM

## 2024-04-05 ENCOUNTER — Ambulatory Visit: Payer: Self-pay | Admitting: Family Medicine

## 2024-04-05 DIAGNOSIS — G43009 Migraine without aura, not intractable, without status migrainosus: Secondary | ICD-10-CM

## 2024-04-05 LAB — CBC WITH DIFFERENTIAL/PLATELET
Basophils Absolute: 0.1 x10E3/uL (ref 0.0–0.2)
Basos: 1 %
EOS (ABSOLUTE): 0.2 x10E3/uL (ref 0.0–0.4)
Eos: 3 %
Hematocrit: 46.8 % — ABNORMAL HIGH (ref 34.0–46.6)
Hemoglobin: 15.6 g/dL (ref 11.1–15.9)
Immature Grans (Abs): 0 x10E3/uL (ref 0.0–0.1)
Immature Granulocytes: 0 %
Lymphocytes Absolute: 1.9 x10E3/uL (ref 0.7–3.1)
Lymphs: 27 %
MCH: 32.6 pg (ref 26.6–33.0)
MCHC: 33.3 g/dL (ref 31.5–35.7)
MCV: 98 fL — ABNORMAL HIGH (ref 79–97)
Monocytes Absolute: 0.5 x10E3/uL (ref 0.1–0.9)
Monocytes: 7 %
Neutrophils Absolute: 4.5 x10E3/uL (ref 1.4–7.0)
Neutrophils: 62 %
Platelets: 262 x10E3/uL (ref 150–450)
RBC: 4.79 x10E6/uL (ref 3.77–5.28)
RDW: 13.7 % (ref 11.7–15.4)
WBC: 7.1 x10E3/uL (ref 3.4–10.8)

## 2024-04-05 LAB — COMPREHENSIVE METABOLIC PANEL WITH GFR
ALT: 13 IU/L (ref 0–32)
AST: 18 IU/L (ref 0–40)
Albumin: 4.6 g/dL (ref 3.8–4.9)
Alkaline Phosphatase: 138 IU/L — ABNORMAL HIGH (ref 44–121)
BUN/Creatinine Ratio: 19 (ref 9–23)
BUN: 17 mg/dL (ref 6–24)
Bilirubin Total: 0.4 mg/dL (ref 0.0–1.2)
CO2: 20 mmol/L (ref 20–29)
Calcium: 9.9 mg/dL (ref 8.7–10.2)
Chloride: 106 mmol/L (ref 96–106)
Creatinine, Ser: 0.88 mg/dL (ref 0.57–1.00)
Globulin, Total: 3.1 g/dL (ref 1.5–4.5)
Glucose: 95 mg/dL (ref 70–99)
Potassium: 4.7 mmol/L (ref 3.5–5.2)
Sodium: 140 mmol/L (ref 134–144)
Total Protein: 7.7 g/dL (ref 6.0–8.5)
eGFR: 77 mL/min/1.73 (ref >59)

## 2024-04-05 LAB — LIPID PANEL
Chol/HDL Ratio: 3 ratio (ref 0.0–4.4)
Cholesterol, Total: 202 mg/dL — ABNORMAL HIGH (ref 100–199)
HDL: 68 mg/dL (ref >39)
LDL Chol Calc (NIH): 119 mg/dL — ABNORMAL HIGH (ref 0–99)
Triglycerides: 86 mg/dL (ref 0–149)
VLDL Cholesterol Cal: 15 mg/dL (ref 5–40)

## 2024-04-08 ENCOUNTER — Ambulatory Visit: Payer: Self-pay

## 2024-04-08 ENCOUNTER — Other Ambulatory Visit: Payer: Self-pay | Admitting: Family Medicine

## 2024-04-08 ENCOUNTER — Telehealth: Admitting: Psychiatry

## 2024-04-08 DIAGNOSIS — T7840XD Allergy, unspecified, subsequent encounter: Secondary | ICD-10-CM

## 2024-04-08 DIAGNOSIS — F4310 Post-traumatic stress disorder, unspecified: Secondary | ICD-10-CM

## 2024-04-08 DIAGNOSIS — F3181 Bipolar II disorder: Secondary | ICD-10-CM | POA: Diagnosis not present

## 2024-04-08 DIAGNOSIS — F99 Mental disorder, not otherwise specified: Secondary | ICD-10-CM | POA: Diagnosis not present

## 2024-04-08 DIAGNOSIS — F5105 Insomnia due to other mental disorder: Secondary | ICD-10-CM | POA: Diagnosis not present

## 2024-04-08 MED ORDER — FLUTICASONE PROPIONATE 50 MCG/ACT NA SUSP: 2.0000 | Freq: Every day | NASAL | 12 refills | Status: DC

## 2024-04-08 NOTE — Telephone Encounter (Signed)
 FYI Only or Action Required?: Action required by provider: update on patient condition.  Patient is followed in Pulmonology for COPD, last seen on 11/09/2023 by Kim Domino, MD.  Called Nurse Triage reporting Cough, Hemoptysis, and Shortness of Breath.  Symptoms began several years ago.  Interventions attempted: Nothing.  Symptoms are: gradually worsening.  Triage Disposition: See Physician Within 24 Hours  Patient/caregiver understands and will follow disposition?: Unsure     Copied from CRM #[id number]. Topic: Clinical - Red Word Triage >> Apr 08, 2024 10:03 AM Rilla NOVAK wrote: [facility] that prompted transfer to Nurse Triage: Diff breathing, persistent cough, sometims phlem, sometimes blood Reason for Disposition  SEVERE coughing spells (e.g., whooping sound after coughing, vomiting after coughing)  Answer Assessment - Initial Assessment Questions E2C2 Pulmonary Triage - Initial Assessment Questions Chief Complaint (e.g., cough, sob, wheezing, fever, chills, sweat or additional symptoms) *Go to specific symptom protocol after initial questions. SOB, productive yellow/white/blood cough, hoarseness Pt does endorse calling today to schedule PFT  How long have symptoms been present? A couple of years but getting worse  Have you tested for COVID or Flu? Note: If not, ask patient if a home test can be taken. If so, instruct patient to call back for positive results. No  MEDICINES:   Have you used any OTC meds to help with symptoms? No If yes, ask What medications? N/a  Have you used your inhalers/maintenance medication? No If yes, What medications? N/a Pt reports INH makes her sick  If inhaler, ask How many puffs and how often? Note: Review instructions on medication in the chart. N/a  OXYGEN: Do you wear supplemental oxygen? No If yes, How many liters are you supposed to use? N/a  Do you monitor your oxygen levels? No If yes, What is  your reading (oxygen level) today? N/a  What is your usual oxygen saturation reading?  (Note: Pulmonary O2 sats should be 90% or greater) N/a      1. ONSET: When did the cough begin?      See above 2. SEVERITY: How bad is the cough today?      See above 3. SPUTUM: Describe the color of your sputum (e.g., none, dry cough; clear, white, yellow, green)     See above 4. HEMOPTYSIS: Are you coughing up any blood? If Yes, ask: How much? (e.g., flecks, streaks, tablespoons, etc.)     Sometimes streaks in mucus, sometimes blood clots 5. DIFFICULTY BREATHING: Are you having difficulty breathing? If Yes, ask: How bad is it? (e.g., mild, moderate, severe)      Mild-mod Triager does appreciate audible SOB during call. Pt is speaking in full sentences.  6. FEVER: Do you have a fever? If Yes, ask: What is your temperature, how was it measured, and when did it start?     denies 7. CARDIAC HISTORY: Do you have any history of heart disease? (e.g., heart attack, congestive heart failure)      Denies Does endorse BP issues, plaque in arteries 8. LUNG HISTORY: Do you have any history of lung disease?  (e.g., pulmonary embolus, asthma, emphysema)     COPD 9. PE RISK FACTORS: Do you have a history of blood clots? (or: recent major surgery, recent prolonged travel, bedridden)     N/a 10. OTHER SYMPTOMS: Do you have any other symptoms? (e.g., runny nose, wheezing, chest pain)       Runny nose, post-tussive emesis 11. PREGNANCY: Is there any chance you are pregnant? When was your last  menstrual period?       N/a 12. TRAVEL: Have you traveled out of the country in the last month? (e.g., travel history, exposures)       N/a    Triager attempted to schedule with [facility], but no access. Triager then offered to look at PCP office as alternative but pt declined. Triager strongly enforced disposition and needing further evaluation. Pt got upset and said thank you and  disconnected call.  Protocols used: Cough - Acute Productive-A-AH

## 2024-04-08 NOTE — Progress Notes (Signed)
 BH MD/PA/NP OP Progress Note  04/08/2024 8:34 AM Katherine Tout  MRN:  [id number]  Chief Complaint: Routine follow-up  Virtual Visit via Video Note  I connected with Kim [name] on 04/08/24 at  8:30 AM EDT by a video enabled telemedicine application and verified that I am speaking with the correct person using two identifiers.  Location: Patient: [address]  [place] [address]  Provider: [facility] Office of Provider   I discussed the limitations of evaluation and management by telemedicine and the availability of in person appointments. The patient expressed understanding and agreed to proceed.    I discussed the assessment and treatment plan with the patient. The patient was provided an opportunity to ask questions and all were answered. The patient agreed with the plan and demonstrated an understanding of the instructions.   The patient was advised to call back or seek an in-person evaluation if the symptoms worsen or if the condition fails to improve as anticipated.  I provided 30 minutes of non-face-to-face time during this encounter.   [name], NP    HPI: 58 year old female present ARPA for follow-up.  Patient reports that she has been stressed that she has been living in her Her stating that she is nearly 58 years old and cannot handle being.  Patient also reports that she is not doing well with the medication of Thorazine  and due to the fact that it is counteracting with literature support that the Thorazine  is counteracting Requip  which she utilizes for leg pain.  Patient reports that Requip  is very poor and that she cannot take any other medications at work for in which it was suggested for the patient to taper off.  Patient has been suggested that since she is now in the middle patient may be able to be off of Thorazine  without any other need for sleeping medications which will be carefully monitored with frequent follow-ups every 2 weeks.  Patient  reports that a lot of thoughts have been on her mind emotionally regarding friends and family.  Patient states that currently her priority is to try to find a place to live as a camper is not ideal as she gets older.  Patient was provided number [facility] in which she was recommended to follow up as this provider will follow up with her every 2 weeks to see if she has any progress.  Patient with no other needs or concerns at this time patient is in agreement to treatment plan.  Patient will be tapering off of Thorazine  due to the counter action with Requip  which nurture support.  Lamictal  200 mg once a day will be continued.  Patient denies SI, HI, AVH.  Patient will follow up in 2 weeks.  I personally spent a total of 30 minutes in the care of the patient today including preparing to see the patient, getting/reviewing separately obtained history, performing a medically appropriate exam/evaluation, counseling and educating, and Referral and counseling of Find help resources.  Visit Diagnosis:    ICD-10-CM   1. PTSD (post-traumatic stress disorder)  F43.10     2. Insomnia due to other mental disorder  F51.05    F99     3. Bipolar 2 disorder (HCC)  F31.81       Past Psychiatric History:  Previous Psych Hospitalizations: Last hospitalization on 2004 after the discovery of her husband cheating on her with her biological daughter.  Patient endorses being hospitalized 14 times since adolescence.  Psychiatric diagnosis history: Bipolar, paranoid schizophrenia, PTSD, major depressive disorder, anxiety.   Medications Current: -Gabapentin  300mg  once at bedtime -Thorazine  50mg  once daily at night, tapering stopping due to Requip  -Lamictal  200mg  once a day, for bipolar depression   Next Steps: -Evaluate sleep, with tapering of Thorazine    Medication Trials: - 01/07/24 Lunesta , poor response and side effects of arm and leg jerking - 01/02/24 Abilify , pt weight gain -Duloxetine -  caused a colostomy due to gastric complications. -Trazodone-states significant side effects while taking therapeutic dose and stopped. -Patient reports many medications have been tried and failed but unable to recall patient states that if mentioned will notify if tried or not. Suicide & Violence: Patient currently denies SI, HI.  Paper should reports having a firearm in the home but has no thoughts of plan to harm herself.   Psychotherapy: Patient is open to therapy but will not seek therapy in the local community and is requesting to see [facility] therapist.  In placed on wait list.   Legal: Denies  Past Medical History:  Past Medical History:  Diagnosis Date   Abnormal drug screen (01/08/2022 UDS) 02/26/2022   (01/08/2022) abnormal UDS (+) for carboxy-THC (marijuana)    Abnormal MRI, cervical spine (01/23/2022) 02/26/2022   (01/23/2022) MRI CERVICAL SPINE FINDINGS:  Alignment: Mild reversal the normal cervical lordosis.     DISC LEVELS:  C3-4: Mild bilateral facet arthropathy  C4-5: Minimal disc bulge and mild bilateral facet arthropathy     IMPRESSION:  1. Mild multilevel degenerative change without significant canal or foraminal stenosis.   Abnormal MRI, lumbar spine (10/28/2021) 12/23/2021   (10/28/2021) LUMBAR MRI FINDINGS:  Alignment: Is trace retrolisthesis of L2 on L3 and L3 on L4, not significantly changed. Alignment is otherwise normal.  Vertebrae: There is anterior compression deformity of the T12 vertebral body with up to approximately 25% loss of vertebral body height anteriorly, not significantly changed since 2021. The other vertebral body heights are preserved. There is mi   Abnormal MRI, thoracic spine (01/23/2022) 02/26/2022   (01/23/2022) MRI THORACIC SPINE FINDINGS:  Alignment: Mildly exaggerated thoracic kyphosis at T11-T12 due to T12 fracture described below.  Vertebrae: Remote T12 compression fracture, unchanged from 10/28/2021 MRI      DISC LEVELS:  Mild disc bulge at T11-12       IMPRESSION:  1. Mild multilevel degenerative change without significant canal or foraminal stenosis.  2. Remote T12 fracture with similar    Allergy    Angina pectoris (HCC) 01/07/2023   Aortic atherosclerosis (HCC) 01/14/2021   Arthritis    Asthma    Atherosclerotic heart disease of native coronary artery without angina pectoris    Bilateral stenosis of lateral recess of lumbar spine    Bipolar disorder (HCC)    Carpal tunnel syndrome (Bilateral) 01/08/2022   Centrilobular emphysema (HCC) 02/21/2016   Formatting of this note might be different from the original.  Last Assessment & Plan:   Formatting of this note might be different from the original.  Ongoing. Not well controlled.  Saw Pulmonology whom she felt didn't help her cough.  Requesting new referral to see a different Pulmonologist.  Referral was placed today during the visit.   Cervical facet syndrome 01/08/2022   Cervical radiculopathy 08/01/2020   Cervicalgia 01/08/2022   Cervicogenic headache 01/08/2022   Chronic cough 03/16/2022   Chronic hip pain (2ry area of Pain) (Bilateral) (L>R) 01/08/2022   Chronic low back pain (1ry area of Pain) (Bilateral) (L>R) w/o sciatica 07/04/2021  Chronic lower extremity pain (3ry area of Pain) (Intermittent) (Left) 01/08/2022   Chronic neck and back pain (5th area of Pain) (Midline) 01/08/2022   Chronic obstructive pulmonary disease (HCC) 06/25/2011   Active smoker  - 04/30/2021  After extensive coaching inhaler device,  effectiveness =    90% with smi > resume stiolto     Formatting of this note might be different from the original.  Formatting of this note might be different from the original.  Active smoker  - 04/30/2021  After extensive coaching inhaler device,  effectiveness =    90% with smi > resume stiolto     Last Assessment & Plan:   Fo   Chronic pain syndrome 12/23/2021   Chronic sacroiliac joint pain (Bilateral) 02/26/2022   Chronic shoulder pain (6th area of Pain) (Bilateral)  01/08/2022   Chronic thoracic back pain (4th area of Pain) (Midline) (Bilateral) 01/08/2022   Cigarette smoker 05/01/2021   DDD (degenerative disc disease), cervical 12/23/2021   DDD (degenerative disc disease), lumbosacral 12/23/2021   (10/28/2021) LUMBAR MRI FINDINGS:  LEVELS:  There is disc desiccation without significant loss of height at L3-L4 and L4-L5. There is disc desiccation and mild narrowing at L1-L2, L2-L3, and L5-S1.  L1-2: central protrusion  L2-3: disc bulge  L3-4: disc bulge with a left foraminal component and annular fissure  L4-5: diffuse disc bulge with a central annular fissure  L5-S1: diffuse disc bulge with   DDD (degenerative disc disease), thoracic 12/23/2021   Depression    Disorder of skeletal system 12/23/2021   Elevated alkaline phosphatase level 02/26/2022   (01/08/2022)- Normal ALP (Alkaline phosphatase) levels are between 44 -121 IU/L, for our Lab. High ALP could suggest liver damage or increased bone cell activity. If other tests such as bilirubin, aspartate aminotransferase (AST), or alanine aminotransferase (ALT) are also high, usually the increased ALP is coming from the liver. Higher-than-normal ALP levels can be seen with: biliary obstruction;    Emphysema of lung (HCC)    Failure to attend appointment 10/20/2022   GERD (gastroesophageal reflux disease)    Grade 1 Retrolisthesis of cervical region (2 mm) (C4/C5) 12/23/2021   Grade 1 Retrolisthesis of L2/L3 and L3/L4 01/08/2022   (10/28/2021) LUMBAR MRI FINDINGS:  Alignment: Is trace retrolisthesis of L2 on L3 and L3 on L4.  LEVELS:  L2-3: There is a mild disc bulge and mild facet arthropathy  L3-4: There is a mild disc bulge with a left foraminal component and annular fissure and mild left worse than right facet arthropathy resulting in narrowing of the left subarticular zone with crowding of the traversing nerve root, an   Headache disorder 09/20/2020   History of ileus 02/19/2021   History of marijuana use  02/26/2022   (01/08/2022) abnormal UDS (+) for carboxy-THC (marijuana)   The electronic medical record also indicated positive results on 10/09/2012 and 11/25/2013.   Leg pain    bilateral   Lumbar disc annular tears 01/08/2022   (10/28/2021) LUMBAR MRI FINDINGS:  LEVELS:  L3-4: There is a mild disc bulge with a left foraminal component and annular fissure  L4-5: There is a diffuse disc bulge with a central annular fissure   Lumbar facet syndrome (Bilateral) 01/08/2022   Lumbar lateral recess stenosis (L3-4) (Left) 12/23/2021   Lumbar radiculopathy 08/01/2020   Lumbosacral facet arthropathy (Multilevel) (Bilateral) 12/23/2021   (10/28/2021) LUMBAR MRI FINDINGS:  LEVELS:  L2-3: mild facet arthropathy  L3-4: mild left worse than right facet arthropathy.  L4-5: bilateral facet  arthropathy  L5-S1: bilateral facet arthropathy. There are trace bilateral facet joint effusions with mild perifacetal soft tissue edema at this level which could reflect a source of pain.     There is facet arthropathy most advanced at L4-L5 and L5-S   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) (Bilateral: L5-S1) 12/23/2021   (10/28/2021) LUMBAR MRI FINDINGS:  LEVELS:  L3-4: mild disc bulge with a left foraminal component resulting in mild left and no significant right neural foraminal stenosis. There is possible contact of the exiting left L3 nerve root by foraminal disc material.  L5-S1: diffuse disc bulge with bilateral foraminal components resulting in mild-to-moderate bilateral neural foraminal stenosis.   Lumbosacral lateral recess stenosis (L3-4, L5-S1) (Left) 01/08/2022   (10/28/2021) LUMBAR MRI FINDINGS:  LEVELS:  L3-4: narrowing of the left subarticular zone. There is possible contact of the exiting left L3 nerve root by foraminal disc material.  L5-S1: mild crowding of the left subarticular zone   Marijuana use 02/26/2022   (01/08/2022) abnormal UDS (+) for carboxy-THC (marijuana)    Migraines 07/02/2015   Mixed  dyslipidemia 01/07/2023   Multiple pulmonary nodules determined by computed tomography of lung 05/01/2021   Active smoker  03/07/21 (vs 12/04/20) new subpleural  area of airspace consolidation within the anterior basal right upper  lobe abutting the major fissure. This area has a mean derived  - CT 07/17/2021 lesions have either resolved or now change c/w inflammatory etiology plus emphysema  > repeat 18 mplaced  in reminder file      Formatting of this note might be different from the original.  Formattin   Nicotine dependence, cigarettes, w unsp disorders 06/25/2011   Numbness and tingling in hands (Bilateral) 01/08/2022   Occipital headache (Bilateral) 01/08/2022   Other specified chronic obstructive pulmonary disease (HCC) 06/25/2011   Other spondylosis, cervical region (C4/C5 and C5/C6) 12/23/2021   Pharmacologic therapy 12/23/2021   Problems influencing health status 12/23/2021   PTSD (post-traumatic stress disorder) 04/05/2020   Rectal bleeding 02/19/2021   RLS (restless legs syndrome) 07/02/2015   Spondylosis without myelopathy or radiculopathy, lumbosacral region 02/26/2022   Stress incontinence 11/01/2021   Substance abuse (HCC)    T12 compression fracture, sequela 08/01/2020   Thoracic facet syndrome 01/08/2022   Upper airway cough syndrome 05/01/2021   Onset 2021   - cyclical cough rx  04/30/2021 >>>        Past Surgical History:  Procedure Laterality Date   ABDOMINAL HYSTERECTOMY     APPENDECTOMY  1986   BREAST SURGERY     breast surgey Left    Cyst Excision   CARPAL TUNNEL RELEASE     COLON SURGERY     COLOSTOMY REVISION  2014   EYE SURGERY     LEFT HEART CATH AND CORONARY ANGIOGRAPHY N/A 01/12/2023   Procedure: LEFT HEART CATH AND CORONARY ANGIOGRAPHY;  Surgeon: Court Dorn PARAS, MD;  Location: [facility];  Service: Cardiovascular;  Laterality: N/A;   SMALL INTESTINE SURGERY     TOTAL ABDOMINAL HYSTERECTOMY W/ BILATERAL SALPINGOOPHORECTOMY  2008   endometriosis    TUBAL LIGATION      Family Psychiatric History: No additional  Family History:  Family History  Problem Relation Age of Onset   Alcohol abuse Mother    Anxiety disorder Mother    Depression Mother    Early death Mother    Varicose Veins Mother    Alcohol abuse Father    Heart attack Father    Heart disease Brother  Heart attack Brother    Seizures Daughter    Heart attack Maternal Grandmother    Heart disease Maternal Grandmother    Heart attack Paternal Grandmother    Alcohol abuse Brother    Depression Brother    Early death Brother     Social History:  Social History   Socioeconomic History   Marital status: Divorced    Spouse name: Not on file   Number of children: 1   Years of education: HS   Highest education level: Some college, no degree  Occupational History   Occupation: Disabled  Tobacco Use   Smoking status: Every Day    Current packs/day: 3.00    Average packs/day: 3.0 packs/day for 42.6 years (127.7 ttl pk-yrs)    Types: Cigarettes, Pipe, Cigars    Start date: 1983    Passive exposure: Current   Smokeless tobacco: Never   Tobacco comments:    3.5-4pdd 04/30/2021  Vaping Use   Vaping status: Former  Substance and Sexual Activity   Alcohol use: Yes    Alcohol/week: 42.0 standard drinks of alcohol    Types: 42 Cans of beer per week   Drug use: Yes    Frequency: 7.0 times per week    Types: Marijuana   Sexual activity: Not Currently    Birth control/protection: Other-see comments    Comment: Menopausal Hysterectomy 2008  Other Topics Concern   Not on file  Social History Narrative   Lives at home alone.   Drinks two pots of coffee per day.   Right-handed.   Social Drivers of Corporate investment banker Strain: Low Risk  (08/05/2023)   Overall Financial Resource Strain (CARDIA)    Difficulty of Paying Living Expenses: Not hard at all  Recent Concern: Financial Resource Strain - High Risk (06/23/2023)   Overall Financial Resource  Strain (CARDIA)    Difficulty of Paying Living Expenses: Very hard  Food Insecurity: No Food Insecurity (08/05/2023)   Hunger Vital Sign    Worried About Running Out of Food in the Last Year: Never true    Ran Out of Food in the Last Year: Never true  Transportation Needs: No Transportation Needs (08/05/2023)   PRAPARE - Administrator, Civil Service (Medical): No    Lack of Transportation (Non-Medical): No  Physical Activity: Sufficiently Active (08/05/2023)   Exercise Vital Sign    Days of Exercise per Week: 7 days    Minutes of Exercise per Session: 150+ min  Stress: Stress Concern Present (08/05/2023)   [employer] of Occupational Health - Occupational Stress Questionnaire    Feeling of Stress : Rather much  Social Connections: Socially Isolated (08/27/2023)   Social Connection and Isolation Panel    Frequency of Communication with Friends and Family: Once a week    Frequency of Social Gatherings with Friends and Family: Never    Attends Religious Services: Never    Database administrator or Organizations: No    Attends Engineer, structural: Not on file    Marital Status: Divorced    Allergies:  Allergies  Allergen Reactions   Penicillin G     Other Reaction(s): Not available, Not available  Other Reaction(s): Not available   Penicillins Other (See Comments)    Unknown childhood reaction   Sulfa Antibiotics Swelling    Metabolic Disorder Labs: Lab Results  Component Value Date   HGBA1C 6.1 08/31/2023   No results found for: PROLACTIN Lab Results  Component Value  Date   CHOL 202 (H) 04/04/2024   TRIG 86 04/04/2024   HDL 68 04/04/2024   CHOLHDL 3.0 04/04/2024   VLDL 80.1 08/31/2023   LDLCALC 119 (H) 04/04/2024   LDLCALC 95 08/31/2023   Lab Results  Component Value Date   TSH 3.42 08/31/2023   TSH 1.130 02/19/2021    Therapeutic Level Labs: No results found for: LITHIUM No results found for: VALPROATE No results found  for: CBMZ  Current Medications: Current Outpatient Medications  Medication Sig Dispense Refill   albuterol  (PROVENTIL ) (2.5 MG/3ML) 0.083% nebulizer solution Take 3 mLs (2.5 mg total) by nebulization every 6 (six) hours as needed for wheezing or shortness of breath. 3 mL 0   Aspirin -Caffeine 845-65 MG PACK Take 6-7 each by mouth daily as needed (migraines). BC/ goodie powder 56 each    Budeson-Glycopyrrol-Formoterol  (BREZTRI  AEROSPHERE) 160-9-4.8 MCG/ACT AERO Inhale 2 puffs into the lungs in the morning and at bedtime. 3 each 3   budesonide -formoterol  (SYMBICORT ) 160-4.5 MCG/ACT inhaler Inhale 2 puffs into the lungs in the morning and at bedtime. 1 each 12   chlorproMAZINE  (THORAZINE ) 100 MG tablet Take 1 tablet (100 mg total) by mouth 3 (three) times daily as needed. 30 tablet 2   fluticasone  (FLONASE ) 50 MCG/ACT nasal spray shake liquid AND INSTILL 1 SPRAY IN EACH NOSTRIL DAILY 16 g 2   fluticasone  (FLONASE ) 50 MCG/ACT nasal spray Place 2 sprays into both nostrils daily. 11.1 mL 12   Fluticasone -Umeclidin-Vilant (TRELEGY ELLIPTA ) 100-62.5-25 MCG/ACT AEPB Inhale 1 puff into the lungs daily. 3 each 3   Fluticasone -Umeclidin-Vilant (TRELEGY ELLIPTA ) 100-62.5-25 MCG/ACT AEPB Inhale 1 puff into the lungs daily. 28 each 0   gabapentin  (NEURONTIN ) 300 MG capsule Take 1 capsule (300 mg total) by mouth at bedtime. 30 capsule 0   lamoTRIgine  (LAMICTAL ) 200 MG tablet Take 1 tablet (200 mg total) by mouth daily. 90 tablet 1   meloxicam  (MOBIC ) 15 MG tablet Take 1 tablet (15 mg total) by mouth daily. 30 tablet 5   nitroGLYCERIN  (NITROSTAT ) 0.4 MG SL tablet Place 1 tablet (0.4 mg total) under the tongue every 5 (five) minutes as needed for chest pain. Patient needs appointment for further refills. 1 st attempt 25 tablet 0   ondansetron  (ZOFRAN -ODT) 8 MG disintegrating tablet DISSOLVE 1 TABLET(8 MG) ON THE TONGUE EVERY 8 HOURS AS NEEDED FOR NAUSEA OR VOMITING 90 tablet 3   rOPINIRole  (REQUIP ) 4 MG tablet  Take 1 tablet (4 mg total) by mouth 5 (five) times daily. 150 tablet 0   rosuvastatin  (CRESTOR ) 5 MG tablet Take 1 tablet (5 mg total) by mouth daily. 30 tablet 0   SUMAtriptan  (IMITREX ) 5 MG/ACT nasal spray Place one spray intranasally at onset of migraine. May repeat dose x 1 after 2 hours if headache persists. Max 40 mg/24 hours. 1 each 5   No current facility-administered medications for this visit.     Musculoskeletal: Strength & Muscle Tone: within normal limits Gait & Station: normal Patient leans: N/A  Psychiatric Specialty Exam: Review of Systems  Constitutional: Negative.   HENT: Negative.    Eyes: Negative.   Respiratory: Negative.    Cardiovascular: Negative.   Gastrointestinal: Negative.   Endocrine: Negative.   Genitourinary: Negative.   Musculoskeletal:  Positive for myalgias.  Skin: Negative.   Allergic/Immunologic: Negative.   Neurological: Negative.   Hematological: Negative.   Psychiatric/Behavioral:  Positive for dysphoric mood. The patient is nervous/anxious.     There were no vitals taken for this visit.There is  no height or weight on file to calculate BMI.  General Appearance: Well Groomed  Eye Contact:  Good  Speech:  Clear and Coherent  Volume:  Normal  Mood:  Anxious and Depressed  Affect:  Appropriate  Thought Process:  Coherent  Orientation:  Full (Time, Place, and Person)  Thought Content: Logical   Suicidal Thoughts:  No  Homicidal Thoughts:  No  Memory:  Immediate;   Good Recent;   Good Remote;   Good  Judgement:  Good  Insight:  Good  Psychomotor Activity:  Normal  Concentration:  Concentration: Good and Attention Span: Good  Recall:  Good  Fund of Knowledge: Good  Language: Good  Akathisia:  No  Handed:  Right  AIMS (if indicated):   Assets:  Desire for Improvement Financial Resources/Insurance Housing  ADL's:  Intact  Cognition: WNL  Sleep:  Good   Screenings: AUDIT    Flowsheet Row Office Visit from 08/07/2023 in [facility] [employer] Most recent reading at 08/05/2023 10:43 AM Office Visit from 06/23/2023 in [facility] [employer] Most recent reading at 06/23/2023  5:01 AM Appointment from 07/23/2023 in [facility] [employer] Most recent reading at 06/04/2023  5:03 AM Appointment from 06/09/2023 in [facility] [employer] Most recent reading at 06/04/2023  5:03 AM  Alcohol Use Disorder Identification Test Final Score (AUDIT) 14  12  12  12     GAD-7    Flowsheet Row Video Visit from 02/05/2024 in [facility] Office Visit from 01/26/2024 in [facility] [address] Visit from 08/07/2023 in [facility] [employer] Visit from 06/23/2023 in [facility] [employer] Visit from 02/06/2022 in [facility]  Total GAD-7 Score 14 15 18 6 18    PHQ2-9    Flowsheet Row Video Visit from 02/05/2024 in [facility] Office Visit from 01/26/2024 in [facility] Visit from 12/23/2023 in [facility] Office Visit from 08/07/2023 in [facility] [employer] Visit from 06/23/2023 in [facility] [employer]  PHQ-2 Total Score 1 1 0 0 0  PHQ-9 Total Score -- 12 -- 9 7   Flowsheet Row Video Visit from 02/05/2024 in [facility] Office Visit from 12/23/2023 in [facility]  C-SSRS RISK CATEGORY No Risk No Risk     Assessment and Plan:  Assessment - Diagnosis: PTSD (post-traumatic stress disorder) [F43.10]  2.  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 Bipolar 2 disorder, currently hypomanic (HCC) [F31.81]    Differential Diagnosis: 1. Primary Insomnia - Progress: Pt currently on requip  and it is indicated to be contraindicated with thorazine . Pt to decrease thorazine .   - Risk Factors: Suicidal risk, worsening symptoms  Plan - Medications:  Continue Lamictal  200mg  once a day, for bipolar, take in the AM.  Patient has been educated on keeping an eye on rashes.  Patient has been encouraged as should a rash develop that is not typical to contact the provider and to stop the medication until instructed. Decrease Thorazine  50mg  once a night, for PTSD and insomnia.  Patient has been educated on then importance of taking medication prior to bed due to the sedating nature and has been  instructed not to operate a vehicle or machinery with this medication.  Patient has been educated on akathisia, abnormal movements or tremors that are created by the medication to stop or call the clinic as well as galactorrhea or amenorrhea to notify the clinic should occur.    - Psychotherapy: Declined local therapy referral and requested to be placed on wait list for a therapist within this office. - Education: Patient educated on therapy and the impact and effects of PTSD with therapy in which she is in agreement.  Patient reports she like to be to see a therapist within his office.  Patient educated on the medication Lunesta  of its uses as well as side effects and adverse reaction.  Patient educated on suicidal risk as well as having a firearm within the home to have a secured with ammunition separated as well as to call 911 or go to emergency department should she have suicidal thoughts with or without a plan. - Follow-Up: Follow-up in 2 week for medication monitoring and adjustment - Referrals: No referrals - Safety Planning:  The patient has been educated, if they should have suicidal thoughts with or without a plan to call 911, or go to the closest emergency department.  Pt verbalized understanding.  Pt endorses having a  firearm in her RV which she lives in for safety due to being single and living in an RV by herself with her dog.  Patient agrees to keep ammunition and firearm separated.  Pt also agrees to call the clinic should they have worsening symptoms before the next appointment.  Patient/Guardian was advised Release of Information must be obtained prior to any record release in order to collaborate their care with an outside provider. Patient/Guardian was advised if they have not already done so to contact the registration department to sign all necessary forms in order for us  to release information regarding their care.   Consent: Patient/Guardian gives verbal consent for treatment and assignment of benefits for services provided during this visit. Patient/Guardian expressed understanding and agreed to proceed.    [name], NP 04/08/2024, 8:34 AM

## 2024-04-14 ENCOUNTER — Telehealth: Payer: Self-pay | Admitting: Pulmonary Disease

## 2024-04-14 MED ORDER — ONDANSETRON 8 MG PO TBDP: 8.0000 mg | ORAL_TABLET | Freq: Three times a day (TID) | ORAL | 3 refills | Status: DC | PRN

## 2024-04-14 NOTE — Telephone Encounter (Signed)
 [facility] to schedule PFT and return office visit.

## 2024-04-15 ENCOUNTER — Other Ambulatory Visit: Payer: Self-pay | Admitting: Family Medicine

## 2024-04-15 DIAGNOSIS — G2581 Restless legs syndrome: Secondary | ICD-10-CM

## 2024-04-15 MED ORDER — ROPINIROLE HCL 4 MG PO TABS: 4.0000 mg | ORAL_TABLET | Freq: Every day | ORAL | 1 refills | Status: DC

## 2024-04-18 ENCOUNTER — Ambulatory Visit: Admitting: Pulmonary Disease

## 2024-04-18 DIAGNOSIS — R0602 Shortness of breath: Secondary | ICD-10-CM

## 2024-04-18 LAB — PULMONARY FUNCTION TEST
DL/VA % pred: 87 %
DL/VA: 3.64 ml/min/mmHg/L
DLCO cor % pred: 92 %
DLCO cor: 20.74 ml/min/mmHg
DLCO unc % pred: 98 %
DLCO unc: 22.02 ml/min/mmHg
FEF 25-75 Post: 1.03 L/s
FEF 25-75 Pre: 2.47 L/s
FEF2575-%Change-Post: -58 %
FEF2575-%Pred-Post: 38 %
FEF2575-%Pred-Pre: 92 %
FEV1-%Change-Post: -22 %
FEV1-%Pred-Post: 74 %
FEV1-%Pred-Pre: 96 %
FEV1-Post: 2.19 L
FEV1-Pre: 2.81 L
FEV1FVC-%Change-Post: -16 %
FEV1FVC-%Pred-Pre: 87 %
FEV6-%Change-Post: -5 %
FEV6-%Pred-Post: 104 %
FEV6-%Pred-Pre: 110 %
FEV6-Post: 3.79 L
FEV6-Pre: 4.03 L
FEV6FVC-%Change-Post: 0 %
FEV6FVC-%Pred-Post: 103 %
FEV6FVC-%Pred-Pre: 102 %
FVC-%Change-Post: -6 %
FVC-%Pred-Post: 100 %
FVC-%Pred-Pre: 108 %
FVC-Post: 3.79 L
FVC-Pre: 4.07 L
Post FEV1/FVC ratio: 58 %
Post FEV6/FVC ratio: 100 %
Pre FEV1/FVC ratio: 69 %
Pre FEV6/FVC Ratio: 99 %

## 2024-04-18 NOTE — Patient Instructions (Signed)
 Full PFT completed today ? ?

## 2024-04-18 NOTE — Progress Notes (Signed)
 Full PFT completed today. Good patient effort. Pt was very dizzy after each portion of test and coughing nonstop. The results of this test does not meets ATS standards for acceptability and repeatability. Pt did get ATS on DLCO. Patient unable to maintain pant frequency for Raw.

## 2024-04-19 DIAGNOSIS — F4312 Post-traumatic stress disorder, chronic: Secondary | ICD-10-CM | POA: Diagnosis not present

## 2024-04-22 ENCOUNTER — Telehealth: Admitting: Psychiatry

## 2024-04-22 DIAGNOSIS — F4310 Post-traumatic stress disorder, unspecified: Secondary | ICD-10-CM | POA: Diagnosis not present

## 2024-04-22 DIAGNOSIS — F3181 Bipolar II disorder: Secondary | ICD-10-CM
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
## 2024-04-23 ENCOUNTER — Encounter: Payer: Self-pay | Admitting: Family Medicine

## 2024-04-25 ENCOUNTER — Encounter: Payer: Self-pay | Admitting: Family Medicine

## 2024-05-03 DIAGNOSIS — F4312 Post-traumatic stress disorder, chronic: Secondary | ICD-10-CM | POA: Diagnosis not present

## 2024-05-06 ENCOUNTER — Telehealth: Admitting: Psychiatry

## 2024-05-09 ENCOUNTER — Telehealth (INDEPENDENT_AMBULATORY_CARE_PROVIDER_SITE_OTHER): Admitting: Psychiatry

## 2024-05-09 DIAGNOSIS — F4310 Post-traumatic stress disorder, unspecified: Secondary | ICD-10-CM

## 2024-05-09 DIAGNOSIS — F3181 Bipolar II disorder: Secondary | ICD-10-CM

## 2024-05-09 MED ORDER — ARIPIPRAZOLE 5 MG PO TABS: 5.0000 mg | ORAL_TABLET | Freq: Every day | ORAL | 0 refills | Status: DC

## 2024-05-09 NOTE — Progress Notes (Signed)
 BH MD/PA/NP OP Progress Note  05/09/2024 1:31 PM Kim [name]  MRN:  [id number]  Chief Complaint: Routine Follow-up  Virtual Visit via Video Note  I connected with Kim [name] on 05/09/24 at  1:30 PM EDT by a video enabled telemedicine application and verified that I am speaking with the correct person using two identifiers.  Location: Patient: Kim [name]  [place] [address]  Provider: [facility] Office of Provider   I discussed the limitations of evaluation and management by telemedicine and the availability of in person appointments. The patient expressed understanding and agreed to proceed.    I discussed the assessment and treatment plan with the patient. The patient was provided an opportunity to ask questions and all were answered. The patient agreed with the plan and demonstrated an understanding of the instructions.   The patient was advised to call back or seek an in-person evaluation if the symptoms worsen or if the condition fails to improve as anticipated.  I provided 30 minutes of non-face-to-face time during this encounter.   [name], NP    HPI: 58 year old female presenting [facility]PA for follow-up.  Patient reports that she missed her last appointment due to going out to a bar in which she drank a lot and when she got home she went to sleep and caused her to sleep through the appointment.  Patient was apologetic and stated that she called back to get an appointment right away to her follow-up.  Patient reports that her health complications continue to be a challenge and states that currently her sleep schedule is very terrible and she is having nightmares again.  Patient also complaining of irritation and agitation reporting that she is getting highly irritable at times due to too clumsy mistakes.  Patient reports being in her hand against the wall or hitting her head on the cabinet and states that she will wishes that she could feel better about  herself in regards of her agitation and irritation.  Patient also reports that she had some stressors with her dog and which was sick and required to go to the vet in which she states her friend Kim [name] helped her pay for but reports that she still continues to feel stress regarding her current lifestyle situations as well as her current and pre-existing stressors.  Based on this assessment interview patient is recommended to start Abilify  5 mg once daily patient was educated that this medication does not cause weight gain and to give the medication a try to help with agitation.  Patient will be checked on in 2 weeks to assess medication effects as well as symptom management.  Patient to continue Lamictal  200 mg once daily.  Patient was also recommended to try and magnesium glycinate and melatonin.  Patient has been requested to follow the instructions on the bottle for both supplements.  Patient with no other questions or concerns.  Patient is in agreement with treatment plan.  Patient denies SI, HI, AVH.  Patient is to follow up in 2 weeks.  Visit Diagnosis:    ICD-10-CM   1. PTSD (post-traumatic stress disorder)  F43.10     2. Bipolar 2 disorder (HCC)  F31.81       Past Psychiatric History:  Previous Psych Hospitalizations: Last hospitalization on 2004 after the discovery of her husband cheating on her with her biological daughter.  Patient endorses being hospitalized 14 times since adolescence.   Psychiatric diagnosis history: Bipolar, paranoid schizophrenia, PTSD, major depressive disorder,  anxiety.   Medications Current: -Gabapentin  300mg  once at bedtime -Lamictal  200mg  once a day, for bipolar depression - Start Abilify  5mg  once daily for agitation.  - Recommended to take Magnesium Glycinate   Next Steps: -Evaluate sleep   Medication Trials: - 05/09/24 Thorazine , interaction with Requip , Requip  is priority. - 01/07/24 Lunesta , poor response and side effects of arm and leg  jerking -Duloxetine - caused a colostomy due to gastric complications. -Trazodone-states significant side effects while taking therapeutic dose and stopped. -Patient reports many medications have been tried and failed but unable to recall patient states that if mentioned will notify if tried or not. Suicide & Violence: Patient currently denies SI, HI.  Paper should reports having a firearm in the home but has no thoughts of plan to harm herself.   Psychotherapy: Patient is open to therapy but will not seek therapy in the local community and is requesting to see [facility] therapist.  In placed on wait list.   Legal: Denies  Past Medical History:  Past Medical History:  Diagnosis Date   Abnormal drug screen (01/08/2022 UDS) 02/26/2022   (01/08/2022) abnormal UDS (+) for carboxy-THC (marijuana)    Abnormal MRI, cervical spine (01/23/2022) 02/26/2022   (01/23/2022) MRI CERVICAL SPINE FINDINGS:  Alignment: Mild reversal the normal cervical lordosis.     DISC LEVELS:  C3-4: Mild bilateral facet arthropathy  C4-5: Minimal disc bulge and mild bilateral facet arthropathy     IMPRESSION:  1. Mild multilevel degenerative change without significant canal or foraminal stenosis.   Abnormal MRI, lumbar spine (10/28/2021) 12/23/2021   (10/28/2021) LUMBAR MRI FINDINGS:  Alignment: Is trace retrolisthesis of L2 on L3 and L3 on L4, not significantly changed. Alignment is otherwise normal.  Vertebrae: There is anterior compression deformity of the T12 vertebral body with up to approximately 25% loss of vertebral body height anteriorly, not significantly changed since 2021. The other vertebral body heights are preserved. There is mi   Abnormal MRI, thoracic spine (01/23/2022) 02/26/2022   (01/23/2022) MRI THORACIC SPINE FINDINGS:  Alignment: Mildly exaggerated thoracic kyphosis at T11-T12 due to T12 fracture described below.  Vertebrae: Remote T12 compression fracture, unchanged from 10/28/2021 MRI      DISC LEVELS:  Mild disc  bulge at T11-12      IMPRESSION:  1. Mild multilevel degenerative change without significant canal or foraminal stenosis.  2. Remote T12 fracture with similar    Allergy    Angina pectoris (HCC) 01/07/2023   Aortic atherosclerosis (HCC) 01/14/2021   Arthritis    Asthma    Atherosclerotic heart disease of native coronary artery without angina pectoris    Bilateral stenosis of lateral recess of lumbar spine    Bipolar disorder (HCC)    Carpal tunnel syndrome (Bilateral) 01/08/2022   Centrilobular emphysema (HCC) 02/21/2016   Formatting of this note might be different from the original.  Last Assessment & Plan:   Formatting of this note might be different from the original.  Ongoing. Not well controlled.  Saw Pulmonology whom she felt didn't help her cough.  Requesting new referral to see a different Pulmonologist.  Referral was placed today during the visit.   Cervical facet syndrome 01/08/2022   Cervical radiculopathy 08/01/2020   Cervicalgia 01/08/2022   Cervicogenic headache 01/08/2022   Chronic cough 03/16/2022   Chronic hip pain (2ry area of Pain) (Bilateral) (L>R) 01/08/2022   Chronic low back pain (1ry area of Pain) (Bilateral) (L>R) w/o sciatica 07/04/2021   Chronic lower extremity pain (3ry  area of Pain) (Intermittent) (Left) 01/08/2022   Chronic neck and back pain (5th area of Pain) (Midline) 01/08/2022   Chronic obstructive pulmonary disease (HCC) 06/25/2011   Active smoker  - 04/30/2021  After extensive coaching inhaler device,  effectiveness =    90% with smi > resume stiolto     Formatting of this note might be different from the original.  Formatting of this note might be different from the original.  Active smoker  - 04/30/2021  After extensive coaching inhaler device,  effectiveness =    90% with smi > resume stiolto     Last Assessment & Plan:   Fo   Chronic pain syndrome 12/23/2021   Chronic sacroiliac joint pain (Bilateral) 02/26/2022   Chronic shoulder pain (6th area of  Pain) (Bilateral) 01/08/2022   Chronic thoracic back pain (4th area of Pain) (Midline) (Bilateral) 01/08/2022   Cigarette smoker 05/01/2021   DDD (degenerative disc disease), cervical 12/23/2021   DDD (degenerative disc disease), lumbosacral 12/23/2021   (10/28/2021) LUMBAR MRI FINDINGS:  LEVELS:  There is disc desiccation without significant loss of height at L3-L4 and L4-L5. There is disc desiccation and mild narrowing at L1-L2, L2-L3, and L5-S1.  L1-2: central protrusion  L2-3: disc bulge  L3-4: disc bulge with a left foraminal component and annular fissure  L4-5: diffuse disc bulge with a central annular fissure  L5-S1: diffuse disc bulge with   DDD (degenerative disc disease), thoracic 12/23/2021   Depression    Disorder of skeletal system 12/23/2021   Elevated alkaline phosphatase level 02/26/2022   (01/08/2022)- Normal ALP (Alkaline phosphatase) levels are between 44 -121 IU/L, for our Lab. High ALP could suggest liver damage or increased bone cell activity. If other tests such as bilirubin, aspartate aminotransferase (AST), or alanine aminotransferase (ALT) are also high, usually the increased ALP is coming from the liver. Higher-than-normal ALP levels can be seen with: biliary obstruction;    Emphysema of lung (HCC)    Failure to attend appointment 10/20/2022   GERD (gastroesophageal reflux disease)    Grade 1 Retrolisthesis of cervical region (2 mm) (C4/C5) 12/23/2021   Grade 1 Retrolisthesis of L2/L3 and L3/L4 01/08/2022   (10/28/2021) LUMBAR MRI FINDINGS:  Alignment: Is trace retrolisthesis of L2 on L3 and L3 on L4.  LEVELS:  L2-3: There is a mild disc bulge and mild facet arthropathy  L3-4: There is a mild disc bulge with a left foraminal component and annular fissure and mild left worse than right facet arthropathy resulting in narrowing of the left subarticular zone with crowding of the traversing nerve root, an   Headache disorder 09/20/2020   History of ileus 02/19/2021   History  of marijuana use 02/26/2022   (01/08/2022) abnormal UDS (+) for carboxy-THC (marijuana)   The electronic medical record also indicated positive results on 10/09/2012 and 11/25/2013.   Leg pain    bilateral   Lumbar disc annular tears 01/08/2022   (10/28/2021) LUMBAR MRI FINDINGS:  LEVELS:  L3-4: There is a mild disc bulge with a left foraminal component and annular fissure  L4-5: There is a diffuse disc bulge with a central annular fissure   Lumbar facet syndrome (Bilateral) 01/08/2022   Lumbar lateral recess stenosis (L3-4) (Left) 12/23/2021   Lumbar radiculopathy 08/01/2020   Lumbosacral facet arthropathy (Multilevel) (Bilateral) 12/23/2021   (10/28/2021) LUMBAR MRI FINDINGS:  LEVELS:  L2-3: mild facet arthropathy  L3-4: mild left worse than right facet arthropathy.  L4-5: bilateral facet arthropathy  L5-S1: bilateral facet  arthropathy. There are trace bilateral facet joint effusions with mild perifacetal soft tissue edema at this level which could reflect a source of pain.     There is facet arthropathy most advanced at L4-L5 and L5-S   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) (Bilateral: L5-S1) 12/23/2021   (10/28/2021) LUMBAR MRI FINDINGS:  LEVELS:  L3-4: mild disc bulge with a left foraminal component resulting in mild left and no significant right neural foraminal stenosis. There is possible contact of the exiting left L3 nerve root by foraminal disc material.  L5-S1: diffuse disc bulge with bilateral foraminal components resulting in mild-to-moderate bilateral neural foraminal stenosis.   Lumbosacral lateral recess stenosis (L3-4, L5-S1) (Left) 01/08/2022   (10/28/2021) LUMBAR MRI FINDINGS:  LEVELS:  L3-4: narrowing of the left subarticular zone. There is possible contact of the exiting left L3 nerve root by foraminal disc material.  L5-S1: mild crowding of the left subarticular zone   Marijuana use 02/26/2022   (01/08/2022) abnormal UDS (+) for carboxy-THC (marijuana)    Migraines 07/02/2015    Mixed dyslipidemia 01/07/2023   Multiple pulmonary nodules determined by computed tomography of lung 05/01/2021   Active smoker  03/07/21 (vs 12/04/20) new subpleural  area of airspace consolidation within the anterior basal right upper  lobe abutting the major fissure. This area has a mean derived  - CT 07/17/2021 lesions have either resolved or now change c/w inflammatory etiology plus emphysema  > repeat 18 mplaced  in reminder file      Formatting of this note might be different from the original.  Formattin   Nicotine dependence, cigarettes, w unsp disorders 06/25/2011   Numbness and tingling in hands (Bilateral) 01/08/2022   Occipital headache (Bilateral) 01/08/2022   Other specified chronic obstructive pulmonary disease (HCC) 06/25/2011   Other spondylosis, cervical region (C4/C5 and C5/C6) 12/23/2021   Pharmacologic therapy 12/23/2021   Problems influencing health status 12/23/2021   PTSD (post-traumatic stress disorder) 04/05/2020   Rectal bleeding 02/19/2021   RLS (restless legs syndrome) 07/02/2015   Spondylosis without myelopathy or radiculopathy, lumbosacral region 02/26/2022   Stress incontinence 11/01/2021   Substance abuse (HCC)    T12 compression fracture, sequela 08/01/2020   Thoracic facet syndrome 01/08/2022   Upper airway cough syndrome 05/01/2021   Onset 2021   - cyclical cough rx  04/30/2021 >>>        Past Surgical History:  Procedure Laterality Date   ABDOMINAL HYSTERECTOMY     APPENDECTOMY  1986   BREAST SURGERY     breast surgey Left    Cyst Excision   CARPAL TUNNEL RELEASE     COLON SURGERY     COLOSTOMY REVISION  2014   EYE SURGERY     LEFT HEART CATH AND CORONARY ANGIOGRAPHY N/A 01/12/2023   Procedure: LEFT HEART CATH AND CORONARY ANGIOGRAPHY;  Surgeon: Court Dorn PARAS, MD;  Location: [facility];  Service: Cardiovascular;  Laterality: N/A;   SMALL INTESTINE SURGERY     TOTAL ABDOMINAL HYSTERECTOMY W/ BILATERAL SALPINGOOPHORECTOMY  2008    endometriosis   TUBAL LIGATION      Family Psychiatric History: No additional  Family History:  Family History  Problem Relation Age of Onset   Alcohol abuse Mother    Anxiety disorder Mother    Depression Mother    Early death Mother    Varicose Veins Mother    Alcohol abuse Father    Heart attack Father    Heart disease Brother    Heart attack  Brother    Seizures Daughter    Heart attack Maternal Grandmother    Heart disease Maternal Grandmother    Heart attack Paternal Grandmother    Alcohol abuse Brother    Depression Brother    Early death Brother     Social History:  Social History   Socioeconomic History   Marital status: Divorced    Spouse name: Not on file   Number of children: 1   Years of education: HS   Highest education level: Some college, no degree  Occupational History   Occupation: Disabled  Tobacco Use   Smoking status: Every Day    Current packs/day: 3.00    Average packs/day: 3.0 packs/day for 42.6 years (127.9 ttl pk-yrs)    Types: Cigarettes, Pipe, Cigars    Start date: 1983    Passive exposure: Current   Smokeless tobacco: Never   Tobacco comments:    3.5-4pdd 04/30/2021  Vaping Use   Vaping status: Former  Substance and Sexual Activity   Alcohol use: Yes    Alcohol/week: 42.0 standard drinks of alcohol    Types: 42 Cans of beer per week   Drug use: Yes    Frequency: 7.0 times per week    Types: Marijuana   Sexual activity: Not Currently    Birth control/protection: Other-see comments    Comment: Menopausal Hysterectomy 2008  Other Topics Concern   Not on file  Social History Narrative   Lives at home alone.   Drinks two pots of coffee per day.   Right-handed.   Social Drivers of Corporate investment banker Strain: Low Risk  (08/05/2023)   Overall Financial Resource Strain (CARDIA)    Difficulty of Paying Living Expenses: Not hard at all  Recent Concern: Financial Resource Strain - High Risk (06/23/2023)   Overall Financial  Resource Strain (CARDIA)    Difficulty of Paying Living Expenses: Very hard  Food Insecurity: No Food Insecurity (08/05/2023)   Hunger Vital Sign    Worried About Running Out of Food in the Last Year: Never true    Ran Out of Food in the Last Year: Never true  Transportation Needs: No Transportation Needs (08/05/2023)   PRAPARE - Administrator, Civil Service (Medical): No    Lack of Transportation (Non-Medical): No  Physical Activity: Sufficiently Active (08/05/2023)   Exercise Vital Sign    Days of Exercise per Week: 7 days    Minutes of Exercise per Session: 150+ min  Stress: Stress Concern Present (08/05/2023)   [employer] of Occupational Health - Occupational Stress Questionnaire    Feeling of Stress : Rather much  Social Connections: Socially Isolated (08/27/2023)   Social Connection and Isolation Panel    Frequency of Communication with Friends and Family: Once a week    Frequency of Social Gatherings with Friends and Family: Never    Attends Religious Services: Never    Database administrator or Organizations: No    Attends Engineer, structural: Not on file    Marital Status: Divorced    Allergies:  Allergies  Allergen Reactions   Penicillin G     Other Reaction(s): Not available, Not available  Other Reaction(s): Not available   Penicillins Other (See Comments)    Unknown childhood reaction   Sulfa Antibiotics Swelling    Metabolic Disorder Labs: Lab Results  Component Value Date   HGBA1C 6.1 08/31/2023   No results found for: PROLACTIN Lab Results  Component Value Date  CHOL 202 (H) 04/04/2024   TRIG 86 04/04/2024   HDL 68 04/04/2024   CHOLHDL 3.0 04/04/2024   VLDL 80.1 08/31/2023   LDLCALC 119 (H) 04/04/2024   LDLCALC 95 08/31/2023   Lab Results  Component Value Date   TSH 3.42 08/31/2023   TSH 1.130 02/19/2021    Therapeutic Level Labs: No results found for: LITHIUM No results found for: VALPROATE No results  found for: CBMZ  Current Medications: Current Outpatient Medications  Medication Sig Dispense Refill   albuterol  (PROVENTIL ) (2.5 MG/3ML) 0.083% nebulizer solution Take 3 mLs (2.5 mg total) by nebulization every 6 (six) hours as needed for wheezing or shortness of breath. 3 mL 0   Aspirin -Caffeine 845-65 MG PACK Take 6-7 each by mouth daily as needed (migraines). BC/ goodie powder 56 each    Budeson-Glycopyrrol-Formoterol  (BREZTRI  AEROSPHERE) 160-9-4.8 MCG/ACT AERO Inhale 2 puffs into the lungs in the morning and at bedtime. 3 each 3   budesonide -formoterol  (SYMBICORT ) 160-4.5 MCG/ACT inhaler Inhale 2 puffs into the lungs in the morning and at bedtime. 1 each 12   chlorproMAZINE  (THORAZINE ) 100 MG tablet Take 1 tablet (100 mg total) by mouth 3 (three) times daily as needed. 30 tablet 2   fluticasone  (FLONASE ) 50 MCG/ACT nasal spray shake liquid AND INSTILL 1 SPRAY IN EACH NOSTRIL DAILY 16 g 2   fluticasone  (FLONASE ) 50 MCG/ACT nasal spray Place 2 sprays into both nostrils daily. 11.1 mL 12   Fluticasone -Umeclidin-Vilant (TRELEGY ELLIPTA ) 100-62.5-25 MCG/ACT AEPB Inhale 1 puff into the lungs daily. 3 each 3   Fluticasone -Umeclidin-Vilant (TRELEGY ELLIPTA ) 100-62.5-25 MCG/ACT AEPB Inhale 1 puff into the lungs daily. 28 each 0   gabapentin  (NEURONTIN ) 300 MG capsule Take 1 capsule (300 mg total) by mouth at bedtime. 30 capsule 0   lamoTRIgine  (LAMICTAL ) 200 MG tablet Take 1 tablet (200 mg total) by mouth daily. 90 tablet 1   meloxicam  (MOBIC ) 15 MG tablet Take 1 tablet (15 mg total) by mouth daily. 30 tablet 5   nitroGLYCERIN  (NITROSTAT ) 0.4 MG SL tablet Place 1 tablet (0.4 mg total) under the tongue every 5 (five) minutes as needed for chest pain. Patient needs appointment for further refills. 1 st attempt 25 tablet 0   ondansetron  (ZOFRAN -ODT) 8 MG disintegrating tablet Take 1 tablet (8 mg total) by mouth every 8 (eight) hours as needed for nausea or vomiting. 90 tablet 3   rOPINIRole  (REQUIP ) 4  MG tablet Take 1 tablet (4 mg total) by mouth 5 (five) times daily. 450 tablet 1   rosuvastatin  (CRESTOR ) 5 MG tablet Take 1 tablet (5 mg total) by mouth daily. 30 tablet 0   SUMAtriptan  (IMITREX ) 5 MG/ACT nasal spray Place one spray intranasally at onset of migraine. May repeat dose x 1 after 2 hours if headache persists. Max 40 mg/24 hours. 1 each 5   No current facility-administered medications for this visit.     Musculoskeletal: Strength & Muscle Tone: within normal limits Gait & Station: normal Patient leans: N/A  Psychiatric Specialty Exam: Review of Systems  Constitutional: Negative.   HENT: Negative.    Eyes: Negative.   Respiratory: Negative.    Cardiovascular: Negative.   Gastrointestinal: Negative.   Endocrine: Negative.   Genitourinary: Negative.   Musculoskeletal: Negative.   Skin: Negative.   Allergic/Immunologic: Negative.   Neurological: Negative.   Hematological: Negative.   Psychiatric/Behavioral:  Positive for dysphoric mood. The patient is nervous/anxious.     There were no vitals taken for this visit.There is no height or  weight on file to calculate BMI.  General Appearance: Well Groomed  Eye Contact:  Good  Speech:  Clear and Coherent  Volume:  Normal  Mood:  Anxious and Depressed  Affect:  Appropriate  Thought Process:  Coherent  Orientation:  Full (Time, Place, and Person)  Thought Content: Logical   Suicidal Thoughts:  No  Homicidal Thoughts:  No  Memory:  Immediate;   Good Recent;   Good Remote;   Good  Judgement:  Good  Insight:  Good  Psychomotor Activity:  Normal  Concentration:  Concentration: Good and Attention Span: Good  Recall:  Good  Fund of Knowledge: Good  Language: Good  Akathisia:  No  Handed:  Right  AIMS (if indicated):   Assets:  Desire for Improvement Financial Resources/Insurance Housing  ADL's:  Intact  Cognition: WNL  Sleep:  Good   Screenings: AUDIT    Flowsheet Row Office Visit from 08/07/2023 in [facility] [employer] Most recent reading at 08/05/2023 10:43 AM Office Visit from 06/23/2023 in [facility] [employer] Most recent reading at 06/23/2023  5:01 AM Appointment from 07/23/2023 in [facility] [employer] Most recent reading at 06/04/2023  5:03 AM Appointment from 06/09/2023 in [facility] [employer] Most recent reading at 06/04/2023  5:03 AM  Alcohol Use Disorder Identification Test Final Score (AUDIT) 14  12  12  12     GAD-7    Flowsheet Row Video Visit from 02/05/2024 in [facility] Office Visit from 01/26/2024 in [facility] Visit from 08/07/2023 in [facility] [place] [facility] [employer] Visit from 06/23/2023 in [facility] [place] [facility] [employer] Visit from 02/06/2022 in [facility]  Total GAD-7 Score 14 15 18 6 18    PHQ2-9    Flowsheet Row Video Visit from 02/05/2024 in [facility] Office Visit from 01/26/2024 in [facility] Visit from 12/23/2023 in [facility] Office Visit from 08/07/2023 in [facility] [place] [facility] [employer] Visit from 06/23/2023 in [facility] [place] [facility] [employer]  PHQ-2 Total Score 1 1 0 0 0  PHQ-9 Total Score -- 12 -- 9 7   Flowsheet Row Video Visit from 02/05/2024 in [facility] Office Visit from 12/23/2023 in [facility]  C-SSRS RISK CATEGORY No Risk No Risk     Assessment and Plan:  Assessment - Diagnosis: PTSD (post-traumatic stress disorder) [F43.10]  2.  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 Bipolar 2 disorder, currently hypomanic (HCC) [F31.81]    Differential Diagnosis: 1. Primary Insomnia - Progress: Pt currently on requip  and it is indicated to be contraindicated with thorazine . Pt to decrease thorazine .   - Risk Factors: Suicidal risk, worsening symptoms  Plan - Medications:  Continue Lamictal  200mg  once a day, for bipolar, take in the AM.  Patient has been educated on keeping an eye on rashes.  Patient has been encouraged as should a rash develop that is not typical to contact the provider and to stop the medication until instructed. Start Abilify  5mg  once daily, for irritation and agitation.  Recommended to take supplements of Magnesium glycinate and melatonin.  - Psychotherapy: Declined local therapy referral and requested to be placed on wait list for  a therapist within this office. - Education: Patient educated on therapy and the impact and effects of PTSD with therapy in which she is in agreement.  Patient reports she like to be to see a therapist within his office.  Patient educated on the medication Lunesta  of its uses as well as side effects and adverse reaction.  Patient educated on suicidal risk as well as having a firearm within the home to have a secured with ammunition separated as well as to call 911 or go to emergency department should she have suicidal thoughts with or without a plan. - Follow-Up: Follow-up in 2 week for medication monitoring and adjustment - Referrals: No referrals - Safety Planning:  The patient has been educated, if they should have suicidal thoughts with or without a plan to call 911, or go to the closest emergency department.  Pt verbalized understanding.  Pt endorses having a firearm in her RV which she lives in for safety due to being single and living in an RV by herself with her dog.  Patient agrees to keep ammunition and firearm separated.  Pt also agrees to call the clinic should they have worsening symptoms before the next appointment.  Patient/Guardian was advised Release of Information  must be obtained prior to any record release in order to collaborate their care with an outside provider. Patient/Guardian was advised if they have not already done so to contact the registration department to sign all necessary forms in order for us  to release information regarding their care.   Consent: Patient/Guardian gives verbal consent for treatment and assignment of benefits for services provided during this visit. Patient/Guardian expressed understanding and agreed to proceed.    [name], NP 05/09/2024, 1:31 PM

## 2024-05-17 ENCOUNTER — Ambulatory Visit: Payer: Self-pay

## 2024-05-17 ENCOUNTER — Encounter: Payer: Self-pay | Admitting: Family Medicine

## 2024-05-17 ENCOUNTER — Ambulatory Visit: Admitting: Family Medicine

## 2024-05-17 DIAGNOSIS — F4312 Post-traumatic stress disorder, chronic: Secondary | ICD-10-CM | POA: Diagnosis not present

## 2024-05-17 DIAGNOSIS — G2581 Restless legs syndrome: Secondary | ICD-10-CM

## 2024-05-17 DIAGNOSIS — T7840XD Allergy, unspecified, subsequent encounter: Secondary | ICD-10-CM

## 2024-05-17 DIAGNOSIS — G43009 Migraine without aura, not intractable, without status migrainosus: Secondary | ICD-10-CM

## 2024-05-17 MED ORDER — ONDANSETRON 8 MG PO TBDP: 8.0000 mg | ORAL_TABLET | Freq: Three times a day (TID) | ORAL | 3 refills | Status: AC | PRN

## 2024-05-17 MED ORDER — FLUTICASONE PROPIONATE 50 MCG/ACT NA SUSP: 2.0000 | Freq: Every day | NASAL | 12 refills | Status: AC

## 2024-05-17 MED ORDER — ROPINIROLE HCL 4 MG PO TABS: 4.0000 mg | ORAL_TABLET | Freq: Every day | ORAL | 1 refills | Status: DC

## 2024-05-17 NOTE — Telephone Encounter (Signed)
 FYI Only or Action Required?: FYI only for provider.  Patient was last seen in primary care on 03/31/2024 by Ziglar, Susan K, MD.  Called Nurse Triage reporting Foot Injury.  Symptoms began several days ago.  Interventions attempted: OTC medications: BC powder.  Symptoms are: right foot injury with bruising/swelling, bilateral ankle swelling, chest pain/SOB chronic and states at baseline gradually worsening.  Triage Disposition: See PCP When Office is Open (Within 3 Days)  Patient/caregiver understands and will follow disposition?: Yes        Copied from CRM #[id number]. Topic: Clinical - Red Word Triage >> May 17, 2024  8:25 AM Nathanel DEL wrote: Red Word that prompted transfer to Nurse Triage: pt dropped a big heavy cabinet on the top of his foot Sunday, it is black and blue.   Last tonight was the worst, he could hardly get up. Reason for Disposition  [1] After 3 days AND [2] pain not improved  Answer Assessment - Initial Assessment Questions 1. MECHANISM: How did the injury happen? (e.g., twisting injury, direct blow)      Wood, heavy cabinet fell on top of right foot while moving the furniture.  2. ONSET: When did the injury happen? (e.g., minutes or hours ago)      Sunday night.  3. LOCATION: Where is the injury located?      Right foot.  4. APPEARANCE of INJURY: What does the injury look like?      Swelling, bruising.  5. WEIGHT-BEARING: Can you put weight on that foot? Can you walk (four steps or more)?       She states since 0200 this morning it has been painful to put it on the ground. She states she can bear weight and walk on it.  6. SIZE: For cuts, bruises, or swelling, ask: How large is it? (e.g., inches or centimeters;  entire joint)      Bruising on top and side near big toe. She states in terms of how large the bruise is she states it is mostly gone now and it is. She states half a finger long, confirmed about an inch long and half inch wide.  Swelling in foot and toes normal and states it has improved.  7. PAIN: Is there pain? If Yes, ask: How bad is the pain? What does it keep you from doing? (Scale 0-10; or none, mild, moderate, severe)     Yes, states the bottom of her foot/heel is where the pain is; achy if on the floor, throbbing if picking up the foot.  8. TETANUS: For any breaks in the skin, ask: When was your last tetanus booster?     20 16.  9. OTHER SYMPTOMS: Do you have any other symptoms?      She states she had bilateral ankle swelling, knee pain about a week ago and lasted 2 days. She states the swelling has been going on for months. Denies any cuts or open wounds from injury. Denies any numbness/tingling, red streaks. She states she has SOB and chest pain, cough (spitting up blood) every morning when she wakes up, states she is following up with her pulmonologist and takes nitroglycerin  for her chest pain and states that has been years. She states they have worsened over the past month which is why she had a pulmonary breathing test. She states it's my norm, moderate difficulty breathing. She states it has not changed and it is the same.  10. PREGNANCY: Is there any chance you are pregnant? When was  your last menstrual period?       N/A.  Protocols used: Foot Injury-A-AH

## 2024-05-17 NOTE — Addendum Note (Signed)
 Addended by: RAYANN REXENE HERO on: 05/17/2024 11:05 AM   Modules accepted: Orders

## 2024-05-19 ENCOUNTER — Ambulatory Visit (INDEPENDENT_AMBULATORY_CARE_PROVIDER_SITE_OTHER): Admitting: Pulmonary Disease

## 2024-05-19 ENCOUNTER — Encounter: Payer: Self-pay | Admitting: Pulmonary Disease

## 2024-05-19 VITALS — BP 120/72 | HR 82 | Temp 97.5°F | Ht 67.0 in | Wt 201.2 lb

## 2024-05-19 DIAGNOSIS — R911 Solitary pulmonary nodule: Secondary | ICD-10-CM | POA: Diagnosis not present

## 2024-05-19 MED ORDER — BREZTRI AEROSPHERE 160-9-4.8 MCG/ACT IN AERO: 2.0000 | INHALATION_SPRAY | Freq: Two times a day (BID) | RESPIRATORY_TRACT | 0 refills | Status: DC

## 2024-05-19 NOTE — Progress Notes (Signed)
 Synopsis: Referred in by Malka Domino, MD   Subjective:   PATIENT ID: Ronal Jimmye Na GENDER: female DOB: [birth year], MRN: [id number]  Chief Complaint  Patient presents with   Shortness of Breath    PFT results. Cough with green/clear/strikes of blood sputum    HPI Ms. Pasquini is a pleasant 58 year old female patient with a past medical history of tobacco use disorder, alcohol use disorder, hyperlipidemia and allergic rhinitis presenting today to the pulmonary clinic for ongoing productive cough with progressively worsening shortness of breath.  She reports that she has been having worsening shortness of breath on exertion for years however most recently this has been significantly worse associated with cough and sputum production.  Also associated with back pain.  She denies any wheezing but does report that cold air bothers her.  She reports poor appetite but stable weight.  She is not on any inhalers but does intermittently use some nebulizer that she has at home.  Family history -grandmother with COPD.  Social history -smokes 3 to 4 packs/day and started at 58 years old.  She drinks 6 beers a day.  She is on disability and does not work.  She lives alone divorced and has no kids.  She does have 1 dog at home.  OV 05/19/2024 - Ms. Stiefel is here to follow up on her PFT results. Spirometry with obstructive defect consistent with COPD. She is not on any inhaler as it makes her feel dizzy. We decided to try Breztri  which I provided in office. We also reviewed her CT chest from March that show multiple nodules, given her extensive smoking history we will repeat a CT chest now. She is not interested in smoking cessation at this time.   ROS All systems were reviewed and are negative except  for the above.  Objective:   Vitals:   05/19/24 0950  BP: 120/72  Pulse: 82  Temp: (!) 97.5 F (36.4 C)  SpO2: 97%  Weight: 201 lb 3.2 oz (91.3 kg)  Height: 5' 7 (1.702 m)   97%  on RA BMI Readings from Last 3 Encounters:  05/19/24 31.51 kg/m  04/18/24 32.01 kg/m  03/31/24 32.73 kg/m   Wt Readings from Last 3 Encounters:  05/19/24 201 lb 3.2 oz (91.3 kg)  04/18/24 204 lb 6.4 oz (92.7 kg)  03/31/24 209 lb (94.8 kg)    Physical Exam GEN: NAD, HEENT: Supple Neck, Reactive Pupils, EOMI  CVS: Normal S1, Normal S2, RRR, No murmurs or ES appreciated  Lungs: Poor air movement but overall clear.  Abdomen: Soft, non tender, non distended, + BS  Extremities: Warm and well perfused, No edema  Skin: No suspicious lesions appreciated  Psych: Normal Affect  Ancillary Information   CBC    Component Value Date/Time   WBC 7.1 04/04/2024 0807   WBC 6.5 10/22/2023 0834   RBC 4.79 04/04/2024 0807   RBC 4.43 10/22/2023 0834   HGB 15.6 04/04/2024 0807   HCT 46.8 (H) 04/04/2024 0807   PLT 262 04/04/2024 0807   MCV 98 (H) 04/04/2024 0807   MCV 96 10/13/2014 1611   MCH 32.6 04/04/2024 0807   MCH 31.9 12/06/2015 1439   MCHC 33.3 04/04/2024 0807   MCHC 34.0 10/22/2023 0834   RDW 13.7 04/04/2024 0807   RDW 13.7 10/13/2014 1611   LYMPHSABS 1.9 04/04/2024 0807   LYMPHSABS 0.8 (L) 08/11/2013 0417   MONOABS 0.5 10/22/2023 0834   MONOABS 0.5 08/11/2013 0417   EOSABS 0.2 04/04/2024 9192  EOSABS 0.0 08/11/2013 0417   BASOSABS 0.1 04/04/2024 0807   BASOSABS 0.0 08/11/2013 0417        Latest Ref Rng & Units 04/18/2024    9:52 AM  PFT Results  FVC-Pre L 4.07   FVC-Predicted Pre % 108   FVC-Post L 3.79   FVC-Predicted Post % 100   Pre FEV1/FVC % % 69   Post FEV1/FCV % % 58   FEV1-Pre L 2.81   FEV1-Predicted Pre % 96   FEV1-Post L 2.19   DLCO uncorrected ml/min/mmHg 22.02   DLCO UNC% % 98   DLCO corrected ml/min/mmHg 20.74   DLCO COR %Predicted % 92   DLVA Predicted % 87    CXR in 2023 with indeterminate nodular density projecting over the left midlung.   No pfts on file    Assessment & Plan:  Ms. Mccarn is a pleasant 58 year old female patient with a  past medical history of tobacco use disorder, alcohol use disorder, hyperlipidemia and allergic rhinitis presenting today to the pulmonary clinic for ongoing productive cough with progressively worsening shortness of breath.  #COPD Stage II gp B #Multiple pulmonary nodule (CT chest 11/2023)  []  Schedule CT chest without contrast. [] Trial Breztri  2puffs twice a day.  []  Continue with albuterol  on an as-needed basis.  # Heavy tobacco use. Discussed importance of smoking cessation however she is not interested at this time.   CT chest without contrast as above.  Should that return nonconcerning will enroll in low-dose CT scan program.  RTC 3 months.   I spent 30 minutes caring for this patient today, including preparing to see the patient, obtaining a medical history , reviewing a separately obtained history, performing a medically appropriate examination and/or evaluation, counseling and educating the patient/family/caregiver, ordering medications, tests, or procedures, documenting clinical information in the electronic health record, and independently interpreting results (not separately reported/billed) and communicating results to the patient/family/caregiver  Darrin Barn, MD [facility] 05/19/2024 9:55 AM

## 2024-05-20 ENCOUNTER — Telehealth: Payer: Self-pay | Admitting: Family Medicine

## 2024-05-20 ENCOUNTER — Other Ambulatory Visit: Payer: Self-pay | Admitting: Family Medicine

## 2024-05-20 DIAGNOSIS — G43009 Migraine without aura, not intractable, without status migrainosus: Secondary | ICD-10-CM

## 2024-05-20 MED ORDER — SUMATRIPTAN SUCCINATE 6 MG/0.5ML ~~LOC~~ SOAJ: SUBCUTANEOUS | 2 refills | Status: DC

## 2024-05-20 NOTE — Telephone Encounter (Signed)
(  KeyBETHA LEARN)  PA Case ID #: [id number]  Rx #: [id number]   Status Sent to Plan today  Drug SUMAtriptan  Succinate 6MG /0.5ML auto-injectors  Form [employer] Electronic Prior Authorization Form (2017 NCPDP) Original Claim Info 70 PA Req, Dr submit ePA at [facility].comOr MD Call [phone], Possible 3 [id number] 03 Lvl of Service PA# 72 PA TYPE8

## 2024-05-20 NOTE — Telephone Encounter (Signed)
 Copied from CRM [id number]. Topic: Clinical - Medication Question >> May 20, 2024  8:18 AM Kim [name] wrote: Reason for CRM: Pt states the nasal spray is not working and she would like the injections for her migraines again. The insurance told her that Dr. Ziglar has to call the insurance and let them know the nasal spray is not working and she needs the injections again for her migraines.  This is for SUMAtriptan  (IMITREX ) 5 MG/ACT (injections,  not the nasal spray)  [id number] - [place], [place] - [address] AT [facility] OF [address] [place] [address] Phone: [phone] Fax: [phone] Hours: Not open 24 hours

## 2024-05-20 NOTE — Telephone Encounter (Signed)
 Patient states she wants the injectable version of Imitrex . Patient has has tried and failed nasal

## 2024-05-23 ENCOUNTER — Other Ambulatory Visit: Payer: Self-pay | Admitting: Nurse Practitioner

## 2024-05-23 ENCOUNTER — Other Ambulatory Visit: Payer: Self-pay | Admitting: Cardiology

## 2024-05-23 DIAGNOSIS — I209 Angina pectoris, unspecified: Secondary | ICD-10-CM

## 2024-05-23 DIAGNOSIS — J432 Centrilobular emphysema: Secondary | ICD-10-CM

## 2024-05-23 NOTE — Telephone Encounter (Signed)
 Outcome Denied on September 5 by [facility] 2017 NCPDP  Here are the policy requirements your request did not meet: Per your health plan's criteria, this drug is covered if you meet the following:  You have tried one preferred drug: rizatriptan  tablet/orally disintegrating tablet (generic for Maxalt /Maxalt  MLT).  The information provided does not show that you meet the criteria listed above. Please speak with your doctor about your choices. This decision was made per the [facility] of   Triptans

## 2024-05-24 DIAGNOSIS — F4312 Post-traumatic stress disorder, chronic: Secondary | ICD-10-CM | POA: Diagnosis not present

## 2024-05-25 ENCOUNTER — Other Ambulatory Visit: Payer: Self-pay | Admitting: Family Medicine

## 2024-05-25 DIAGNOSIS — G43809 Other migraine, not intractable, without status migrainosus: Secondary | ICD-10-CM

## 2024-05-25 MED ORDER — RIZATRIPTAN BENZOATE 10 MG PO TBDP: 10.0000 mg | ORAL_TABLET | ORAL | 3 refills | Status: AC | PRN

## 2024-05-25 NOTE — Progress Notes (Unsigned)
 Ronal, I have sent in a prescription for Maxalt  for you.  Best regards, Dr. Charo Philipp

## 2024-05-26 ENCOUNTER — Ambulatory Visit
Admission: RE | Admit: 2024-05-26 | Discharge: 2024-05-26 | Disposition: A | Discharge status: Home / Self Care | Source: Ambulatory Visit | Attending: Pulmonary Disease | Admitting: Pulmonary Disease

## 2024-05-26 DIAGNOSIS — R911 Solitary pulmonary nodule: Secondary | ICD-10-CM | POA: Diagnosis not present

## 2024-05-26 DIAGNOSIS — J984 Other disorders of lung: Secondary | ICD-10-CM | POA: Diagnosis not present

## 2024-05-26 DIAGNOSIS — R918 Other nonspecific abnormal finding of lung field: Secondary | ICD-10-CM | POA: Diagnosis not present

## 2024-05-27 ENCOUNTER — Telehealth: Admitting: Psychiatry

## 2024-05-27 DIAGNOSIS — F99 Mental disorder, not otherwise specified: Secondary | ICD-10-CM | POA: Diagnosis not present

## 2024-05-27 DIAGNOSIS — F5105 Insomnia due to other mental disorder: Secondary | ICD-10-CM | POA: Diagnosis not present

## 2024-05-27 DIAGNOSIS — F4310 Post-traumatic stress disorder, unspecified: Secondary | ICD-10-CM | POA: Diagnosis not present

## 2024-05-27 DIAGNOSIS — F3181 Bipolar II disorder: Secondary | ICD-10-CM | POA: Diagnosis not present

## 2024-05-27 NOTE — Progress Notes (Addendum)
 BH MD/PA/NP OP Progress Note  05/27/2024 8:33 AM Kim [name]  MRN:  [id number]  Chief Complaint: Routine Follow-up  Virtual Visit via Video Note  I connected with Kim [name] on 06/10/24 at  8:30 AM EDT by a video enabled telemedicine application and verified that I am speaking with the correct person using two identifiers.  Location: Patient: [address]  [place] [address]  Provider: [facility] Office Provider   I discussed the limitations of evaluation and management by telemedicine and the availability of in person appointments. The patient expressed understanding and agreed to proceed.    I discussed the assessment and treatment plan with the patient. The patient was provided an opportunity to ask questions and all were answered. The patient agreed with the plan and demonstrated an understanding of the instructions.   The patient was advised to call back or seek an in-person evaluation if the symptoms worsen or if the condition fails to improve as anticipated.  I provided 30 minutes of non-face-to-face time during this encounter.   [name], NP    HPI: 58 year old female presenting ARPA for follow-up.  Patient presents stating that she is experiencing her usual dysthymic mood as well as stressors that she has been stating were consistent due to her complex trauma and her asked as well as current life stressors.  Patient was explaining that the Abilify  that she was taking has been causing abnormal movements and jerking in which she has decided to stop the medication which this provider was in agreement as these can be a side effects of Abilify  and was instructed to stop the medication.  When speaking about medications patient was reassured that this provider want to respect her wishes and also understand that her side effects do occur with medications and that stopping the medications is absolutely necessary if the patient is not in agreement with the  medication either through personal choice, side effects, or adverse reactions.  With the stopping of Abilify  patient is now not taking any medications from psychiatry in which patient was instructed that as this provider was able to get get her attached to a therapist that she is happy with, that this provider does not need to check in as frequently.  When trying explain that currently the patient is not taking any medications by this provider patient became upset, regarding this provider statement when she was instructed to reach out to this provider, if we want to give meds to try again due to possible agitation or irritation.  Patient was observed visibly upset about this statement in which patient was attempted to be redirected stating that as this provider is not providing medications that this patient can reach out if medications would be needed again in which she stated that there is no reason as we have been working on medications.  Another attempt was made to try to clarify this statement as the patient was visibly upset but then the patient hung up on the call before I can provide discharge instructions.  Based on this assessment interview is recommended for the patient to stop Abilify  due to reported side effects of abnormal movements and jerking.  Patient is currently not on any medications with this provider.  Due to the patient ending a call before the appointment was completed, no follow-up was created.  Patient was encouraged to continue therapy with [facility].  Visit Diagnosis:    ICD-10-CM   1. PTSD (post-traumatic stress  disorder)  F43.10     2. Bipolar 2 disorder (HCC)  F31.81       Past Psychiatric History:  Previous Psych Hospitalizations: Last hospitalization on 2004 after the discovery of her husband cheating on her with her biological daughter.  Patient endorses being hospitalized 14 times since adolescence.   Psychiatric diagnosis history:  Bipolar, paranoid schizophrenia, PTSD, major depressive disorder, anxiety.   Medications Current: -Gabapentin  300mg  once at bedtime -Lamictal  200mg  once a day, for bipolar depression - Start Abilify  5mg  once daily for agitation.  - Recommended to take Magnesium Glycinate   Next Steps: -Evaluate sleep   Medication Trials: - 05/09/24 Thorazine , interaction with Requip , Requip  is priority. - 01/07/24 Lunesta , poor response and side effects of arm and leg jerking -Duloxetine - caused a colostomy due to gastric complications. -Trazodone-states significant side effects while taking therapeutic dose and stopped. -05/27/24 Lamictal  - Stopped due to concerns of side effects -05/27/24 Abilify  - stopped due to jerking and abnormal movements.  -Patient reports many medications have been tried and failed but unable to recall patient states that if mentioned will notify if tried or not. Suicide & Violence: Patient currently denies SI, HI.  Paper should reports having a firearm in the home but has no thoughts of plan to harm herself.   Psychotherapy: Patient is open to therapy but will not seek therapy in the local community and is requesting to see [facility] therapist.  In placed on wait list.   Legal: Denies  Past Medical History:  Past Medical History:  Diagnosis Date   Abnormal drug screen (01/08/2022 UDS) 02/26/2022   (01/08/2022) abnormal UDS (+) for carboxy-THC (marijuana)    Abnormal MRI, cervical spine (01/23/2022) 02/26/2022   (01/23/2022) MRI CERVICAL SPINE FINDINGS:  Alignment: Mild reversal the normal cervical lordosis.     DISC LEVELS:  C3-4: Mild bilateral facet arthropathy  C4-5: Minimal disc bulge and mild bilateral facet arthropathy     IMPRESSION:  1. Mild multilevel degenerative change without significant canal or foraminal stenosis.   Abnormal MRI, lumbar spine (10/28/2021) 12/23/2021   (10/28/2021) LUMBAR MRI FINDINGS:  Alignment: Is trace retrolisthesis of L2 on L3 and L3 on L4, not  significantly changed. Alignment is otherwise normal.  Vertebrae: There is anterior compression deformity of the T12 vertebral body with up to approximately 25% loss of vertebral body height anteriorly, not significantly changed since 2021. The other vertebral body heights are preserved. There is mi   Abnormal MRI, thoracic spine (01/23/2022) 02/26/2022   (01/23/2022) MRI THORACIC SPINE FINDINGS:  Alignment: Mildly exaggerated thoracic kyphosis at T11-T12 due to T12 fracture described below.  Vertebrae: Remote T12 compression fracture, unchanged from 10/28/2021 MRI      DISC LEVELS:  Mild disc bulge at T11-12      IMPRESSION:  1. Mild multilevel degenerative change without significant canal or foraminal stenosis.  2. Remote T12 fracture with similar    Allergy    Angina pectoris (HCC) 01/07/2023   Aortic atherosclerosis (HCC) 01/14/2021   Arthritis    Asthma    Atherosclerotic heart disease of native coronary artery without angina pectoris    Bilateral stenosis of lateral recess of lumbar spine    Bipolar disorder (HCC)    Carpal tunnel syndrome (Bilateral) 01/08/2022   Centrilobular emphysema (HCC) 02/21/2016   Formatting of this note might be different from the original.  Last Assessment & Plan:   Formatting of this note might be different from the original.  Ongoing. Not well controlled.  Saw Pulmonology whom she felt didn't help her cough.  Requesting new referral to see a different Pulmonologist.  Referral was placed today during the visit.   Cervical facet syndrome 01/08/2022   Cervical radiculopathy 08/01/2020   Cervicalgia 01/08/2022   Cervicogenic headache 01/08/2022   Chronic cough 03/16/2022   Chronic hip pain (2ry area of Pain) (Bilateral) (L>R) 01/08/2022   Chronic low back pain (1ry area of Pain) (Bilateral) (L>R) w/o sciatica 07/04/2021   Chronic lower extremity pain (3ry area of Pain) (Intermittent) (Left) 01/08/2022   Chronic neck and back pain (5th area of Pain) (Midline)  01/08/2022   Chronic obstructive pulmonary disease (HCC) 06/25/2011   Active smoker  - 04/30/2021  After extensive coaching inhaler device,  effectiveness =    90% with smi > resume stiolto     Formatting of this note might be different from the original.  Formatting of this note might be different from the original.  Active smoker  - 04/30/2021  After extensive coaching inhaler device,  effectiveness =    90% with smi > resume stiolto     Last Assessment & Plan:   Fo   Chronic pain syndrome 12/23/2021   Chronic sacroiliac joint pain (Bilateral) 02/26/2022   Chronic shoulder pain (6th area of Pain) (Bilateral) 01/08/2022   Chronic thoracic back pain (4th area of Pain) (Midline) (Bilateral) 01/08/2022   Cigarette smoker 05/01/2021   DDD (degenerative disc disease), cervical 12/23/2021   DDD (degenerative disc disease), lumbosacral 12/23/2021   (10/28/2021) LUMBAR MRI FINDINGS:  LEVELS:  There is disc desiccation without significant loss of height at L3-L4 and L4-L5. There is disc desiccation and mild narrowing at L1-L2, L2-L3, and L5-S1.  L1-2: central protrusion  L2-3: disc bulge  L3-4: disc bulge with a left foraminal component and annular fissure  L4-5: diffuse disc bulge with a central annular fissure  L5-S1: diffuse disc bulge with   DDD (degenerative disc disease), thoracic 12/23/2021   Depression    Disorder of skeletal system 12/23/2021   Elevated alkaline phosphatase level 02/26/2022   (01/08/2022)- Normal ALP (Alkaline phosphatase) levels are between 44 -121 IU/L, for our Lab. High ALP could suggest liver damage or increased bone cell activity. If other tests such as bilirubin, aspartate aminotransferase (AST), or alanine aminotransferase (ALT) are also high, usually the increased ALP is coming from the liver. Higher-than-normal ALP levels can be seen with: biliary obstruction;    Emphysema of lung (HCC)    Failure to attend appointment 10/20/2022   GERD (gastroesophageal reflux disease)     Grade 1 Retrolisthesis of cervical region (2 mm) (C4/C5) 12/23/2021   Grade 1 Retrolisthesis of L2/L3 and L3/L4 01/08/2022   (10/28/2021) LUMBAR MRI FINDINGS:  Alignment: Is trace retrolisthesis of L2 on L3 and L3 on L4.  LEVELS:  L2-3: There is a mild disc bulge and mild facet arthropathy  L3-4: There is a mild disc bulge with a left foraminal component and annular fissure and mild left worse than right facet arthropathy resulting in narrowing of the left subarticular zone with crowding of the traversing nerve root, an   Headache disorder 09/20/2020   History of ileus 02/19/2021   History of marijuana use 02/26/2022   (01/08/2022) abnormal UDS (+) for carboxy-THC (marijuana)   The electronic medical record also indicated positive results on 10/09/2012 and 11/25/2013.   Leg pain    bilateral   Lumbar disc annular tears 01/08/2022   (10/28/2021) LUMBAR MRI FINDINGS:  LEVELS:  L3-4: There is  a mild disc bulge with a left foraminal component and annular fissure  L4-5: There is a diffuse disc bulge with a central annular fissure   Lumbar facet syndrome (Bilateral) 01/08/2022   Lumbar lateral recess stenosis (L3-4) (Left) 12/23/2021   Lumbar radiculopathy 08/01/2020   Lumbosacral facet arthropathy (Multilevel) (Bilateral) 12/23/2021   (10/28/2021) LUMBAR MRI FINDINGS:  LEVELS:  L2-3: mild facet arthropathy  L3-4: mild left worse than right facet arthropathy.  L4-5: bilateral facet arthropathy  L5-S1: bilateral facet arthropathy. There are trace bilateral facet joint effusions with mild perifacetal soft tissue edema at this level which could reflect a source of pain.     There is facet arthropathy most advanced at L4-L5 and L5-S   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) (Bilateral: L5-S1) 12/23/2021   (10/28/2021) LUMBAR MRI FINDINGS:  LEVELS:  L3-4: mild disc bulge with a left foraminal component resulting in mild left and no significant right neural foraminal stenosis. There is possible contact of the  exiting left L3 nerve root by foraminal disc material.  L5-S1: diffuse disc bulge with bilateral foraminal components resulting in mild-to-moderate bilateral neural foraminal stenosis.   Lumbosacral lateral recess stenosis (L3-4, L5-S1) (Left) 01/08/2022   (10/28/2021) LUMBAR MRI FINDINGS:  LEVELS:  L3-4: narrowing of the left subarticular zone. There is possible contact of the exiting left L3 nerve root by foraminal disc material.  L5-S1: mild crowding of the left subarticular zone   Marijuana use 02/26/2022   (01/08/2022) abnormal UDS (+) for carboxy-THC (marijuana)    Migraines 07/02/2015   Mixed dyslipidemia 01/07/2023   Multiple pulmonary nodules determined by computed tomography of lung 05/01/2021   Active smoker  03/07/21 (vs 12/04/20) new subpleural  area of airspace consolidation within the anterior basal right upper  lobe abutting the major fissure. This area has a mean derived  - CT 07/17/2021 lesions have either resolved or now change c/w inflammatory etiology plus emphysema  > repeat 18 mplaced  in reminder file      Formatting of this note might be different from the original.  Formattin   Nicotine dependence, cigarettes, w unsp disorders 06/25/2011   Numbness and tingling in hands (Bilateral) 01/08/2022   Occipital headache (Bilateral) 01/08/2022   Other specified chronic obstructive pulmonary disease (HCC) 06/25/2011   Other spondylosis, cervical region (C4/C5 and C5/C6) 12/23/2021   Pharmacologic therapy 12/23/2021   Problems influencing health status 12/23/2021   PTSD (post-traumatic stress disorder) 04/05/2020   Rectal bleeding 02/19/2021   RLS (restless legs syndrome) 07/02/2015   Spondylosis without myelopathy or radiculopathy, lumbosacral region 02/26/2022   Stress incontinence 11/01/2021   Substance abuse (HCC)    T12 compression fracture, sequela 08/01/2020   Thoracic facet syndrome 01/08/2022   Upper airway cough syndrome 05/01/2021   Onset 2021   - cyclical cough rx   04/30/2021 >>>        Past Surgical History:  Procedure Laterality Date   ABDOMINAL HYSTERECTOMY     APPENDECTOMY  1986   BREAST SURGERY     breast surgey Left    Cyst Excision   CARPAL TUNNEL RELEASE     COLON SURGERY     COLOSTOMY REVISION  2014   EYE SURGERY     LEFT HEART CATH AND CORONARY ANGIOGRAPHY N/A 01/12/2023   Procedure: LEFT HEART CATH AND CORONARY ANGIOGRAPHY;  Surgeon: Court Dorn PARAS, MD;  Location: [facility];  Service: Cardiovascular;  Laterality: N/A;   SMALL INTESTINE SURGERY     TOTAL ABDOMINAL HYSTERECTOMY  W/ BILATERAL SALPINGOOPHORECTOMY  2008   endometriosis   TUBAL LIGATION      Family Psychiatric History: No additional  Family History:  Family History  Problem Relation Age of Onset   Alcohol abuse Mother    Anxiety disorder Mother    Depression Mother    Early death Mother    Varicose Veins Mother    Alcohol abuse Father    Heart attack Father    Heart disease Brother    Heart attack Brother    Seizures Daughter    Heart attack Maternal Grandmother    Heart disease Maternal Grandmother    Heart attack Paternal Grandmother    Alcohol abuse Brother    Depression Brother    Early death Brother     Social History:  Social History   Socioeconomic History   Marital status: Divorced    Spouse name: Not on file   Number of children: 1   Years of education: HS   Highest education level: Some college, no degree  Occupational History   Occupation: Disabled  Tobacco Use   Smoking status: Every Day    Current packs/day: 3.00    Average packs/day: 3.0 packs/day for 42.7 years (128.1 ttl pk-yrs)    Types: Cigarettes, Pipe, Cigars    Start date: 1983    Passive exposure: Current   Smokeless tobacco: Never   Tobacco comments:    Smokes 1-1.5 PPD- khj 05/19/2024        Started smoking at 58 years old    Smoked 4 PPD at her heaviest  Vaping Use   Vaping status: Former  Substance and Sexual Activity   Alcohol use: Yes     Alcohol/week: 42.0 standard drinks of alcohol    Types: 42 Cans of beer per week   Drug use: Yes    Frequency: 7.0 times per week    Types: Marijuana   Sexual activity: Not Currently    Birth control/protection: Other-see comments    Comment: Menopausal Hysterectomy 2008  Other Topics Concern   Not on file  Social History Narrative   Lives at home alone.   Drinks two pots of coffee per day.   Right-handed.   Social Drivers of Corporate investment banker Strain: Low Risk  (08/05/2023)   Overall Financial Resource Strain (CARDIA)    Difficulty of Paying Living Expenses: Not hard at all  Recent Concern: Financial Resource Strain - High Risk (06/23/2023)   Overall Financial Resource Strain (CARDIA)    Difficulty of Paying Living Expenses: Very hard  Food Insecurity: No Food Insecurity (08/05/2023)   Hunger Vital Sign    Worried About Running Out of Food in the Last Year: Never true    Ran Out of Food in the Last Year: Never true  Transportation Needs: No Transportation Needs (08/05/2023)   PRAPARE - Administrator, Civil Service (Medical): No    Lack of Transportation (Non-Medical): No  Physical Activity: Sufficiently Active (08/05/2023)   Exercise Vital Sign    Days of Exercise per Week: 7 days    Minutes of Exercise per Session: 150+ min  Stress: Stress Concern Present (08/05/2023)   [employer] of Occupational Health - Occupational Stress Questionnaire    Feeling of Stress : Rather much  Social Connections: Socially Isolated (08/27/2023)   Social Connection and Isolation Panel    Frequency of Communication with Friends and Family: Once a week    Frequency of Social Gatherings with Friends and Family: Never  Attends Religious Services: Never    Active Member of Clubs or Organizations: No    Attends Banker Meetings: Not on file    Marital Status: Divorced    Allergies:  Allergies  Allergen Reactions   Penicillin G     Other Reaction(s):  Not available, Not available  Other Reaction(s): Not available   Penicillins Other (See Comments)    Unknown childhood reaction   Sulfa Antibiotics Swelling    Metabolic Disorder Labs: Lab Results  Component Value Date   HGBA1C 6.1 08/31/2023   No results found for: PROLACTIN Lab Results  Component Value Date   CHOL 202 (H) 04/04/2024   TRIG 86 04/04/2024   HDL 68 04/04/2024   CHOLHDL 3.0 04/04/2024   VLDL 80.1 08/31/2023   LDLCALC 119 (H) 04/04/2024   LDLCALC 95 08/31/2023   Lab Results  Component Value Date   TSH 3.42 08/31/2023   TSH 1.130 02/19/2021    Therapeutic Level Labs: No results found for: LITHIUM No results found for: VALPROATE No results found for: CBMZ  Current Medications: Current Outpatient Medications  Medication Sig Dispense Refill   albuterol  (PROVENTIL ) (2.5 MG/3ML) 0.083% nebulizer solution Take 3 mLs (2.5 mg total) by nebulization every 6 (six) hours as needed for wheezing or shortness of breath. (Patient not taking: Reported on 05/19/2024) 3 mL 0   ARIPiprazole  (ABILIFY ) 5 MG tablet Take 1 tablet (5 mg total) by mouth daily. 30 tablet 0   Aspirin -Caffeine 845-65 MG PACK Take 6-7 each by mouth daily as needed (migraines). BC/ goodie powder 56 each    Budeson-Glycopyrrol-Formoterol  (BREZTRI  AEROSPHERE) 160-9-4.8 MCG/ACT AERO Inhale 2 puffs into the lungs in the morning and at bedtime. (Patient not taking: Reported on 05/19/2024) 3 each 3   budesonide -glycopyrrolate-formoterol  (BREZTRI  AEROSPHERE) 160-9-4.8 MCG/ACT AERO inhaler Inhale 2 puffs into the lungs in the morning and at bedtime. 11.8 g 0   chlorproMAZINE  (THORAZINE ) 100 MG tablet Take 1 tablet (100 mg total) by mouth 3 (three) times daily as needed. 30 tablet 2   fluticasone  (FLONASE ) 50 MCG/ACT nasal spray shake liquid AND INSTILL 1 SPRAY IN EACH NOSTRIL DAILY (Patient not taking: Reported on 05/19/2024) 16 g 2   fluticasone  (FLONASE ) 50 MCG/ACT nasal spray Place 2 sprays into both  nostrils daily. 11.1 mL 12   gabapentin  (NEURONTIN ) 300 MG capsule Take 1 capsule (300 mg total) by mouth at bedtime. 30 capsule 0   lamoTRIgine  (LAMICTAL ) 200 MG tablet Take 1 tablet (200 mg total) by mouth daily. 90 tablet 1   meloxicam  (MOBIC ) 15 MG tablet Take 1 tablet (15 mg total) by mouth daily. 30 tablet 5   nitroGLYCERIN  (NITROSTAT ) 0.4 MG SL tablet TAKE 1 TABLET BY MOUTH AS NEEDED CHEST PAIN, MAY REPEAT IN 5 MINUTES IF NEEDED. IF NO RELIEF AFTER 3 CALL 911 25 tablet 0   ondansetron  (ZOFRAN -ODT) 8 MG disintegrating tablet Take 1 tablet (8 mg total) by mouth every 8 (eight) hours as needed for nausea or vomiting. 90 tablet 3   rizatriptan  (MAXALT -MLT) 10 MG disintegrating tablet Take 1 tablet (10 mg total) by mouth as needed for migraine. May repeat in 2 hours if needed 10 tablet 3   rOPINIRole  (REQUIP ) 4 MG tablet Take 1 tablet (4 mg total) by mouth 5 (five) times daily. 450 tablet 1   rosuvastatin  (CRESTOR ) 5 MG tablet Take 1 tablet (5 mg total) by mouth daily. 30 tablet 0   No current facility-administered medications for this visit.  Musculoskeletal: Strength & Muscle Tone: within normal limits Gait & Station: normal Patient leans: N/A   Psychiatric Specialty Exam: Review of Systems  Constitutional: Negative.   HENT: Negative.    Eyes: Negative.   Respiratory: Negative.    Cardiovascular: Negative.   Gastrointestinal: Negative.   Endocrine: Negative.   Genitourinary: Negative.   Musculoskeletal: Negative.   Skin: Negative.   Allergic/Immunologic: Negative.   Neurological: Negative.   Hematological: Negative.   Psychiatric/Behavioral:  Positive for dysphoric mood. The patient is nervous/anxious.     There were no vitals taken for this visit.There is no height or weight on file to calculate BMI.  General Appearance: Well Groomed  Eye Contact:  Good  Speech:  Clear and Coherent  Volume:  Normal  Mood:  Agitated  Affect:  Appropriate  Thought Process:  Coherent   Orientation:  Full (Time, Place, and Person)  Thought Content: Logical   Suicidal Thoughts:  No  Homicidal Thoughts:  No  Memory:  Immediate;   Good Recent;   Good Remote;   Good  Judgement:  Good  Insight:  Good  Psychomotor Activity:  Normal  Concentration:  Concentration: Good and Attention Span: Good  Recall:  Good  Fund of Knowledge: Good  Language: Good  Akathisia:  No  Handed:  Right  AIMS (if indicated):   Assets:  Desire for Improvement Financial Resources/Insurance Housing  ADL's:  Intact  Cognition: WNL  Sleep:  Good   Screenings: AUDIT    Flowsheet Row Office Visit from 08/07/2023 in [facility] [employer] Most recent reading at 08/05/2023 10:43 AM Office Visit from 06/23/2023 in [facility] [place] [facility] [employer] Most recent reading at 06/23/2023  5:01 AM Appointment from 07/23/2023 in [facility] [employer] Most recent reading at 06/04/2023  5:03 AM Appointment from 06/09/2023 in [facility] [employer] Most recent reading at 06/04/2023  5:03 AM  Alcohol Use Disorder Identification Test Final Score (AUDIT) 14  12  12  12     GAD-7    Flowsheet Row Video Visit from 02/05/2024 in [facility] Office Visit from 01/26/2024 in [facility] Visit from 08/07/2023 in [facility] [employer] Visit from 06/23/2023 in [facility] [place] [facility] [employer] Visit from 02/06/2022 in [facility]  Total GAD-7 Score 14 15 18 6 18    PHQ2-9    Flowsheet Row Video Visit from 02/05/2024 in [facility] Office Visit from 01/26/2024 in [facility] Visit from 12/23/2023 in [facility] Office Visit from 08/07/2023 in [facility]  [place] [facility] [employer] Visit from 06/23/2023 in [facility] [place] [facility] [employer]  PHQ-2 Total Score 1 1 0 0 0  PHQ-9 Total Score -- 12 -- 9 7   Flowsheet Row Video Visit from 02/05/2024 in [facility] Office Visit from 12/23/2023 in [facility]  C-SSRS RISK CATEGORY No Risk No Risk     Assessment and Plan:  Assessment - Diagnosis: PTSD (post-traumatic stress disorder) [F43.10]  2.  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 Bipolar 2 disorder, currently hypomanic (HCC) [F31.81]   Differential Diagnosis: 1. Primary Insomnia - Progress: Pt currently on requip  and it is indicated to be contraindicated with  thorazine . Pt to decrease thorazine .   - Risk Factors: Suicidal risk, worsening symptoms  Plan - Medications:  Stop Lamictal ,  Stop Abilify , due to jerking and abnormal movements Recommended to take supplements of Magnesium glycinate and melatonin.   - Psychotherapy: Declined local therapy referral and requested to be placed on wait list for a therapist within this office. - Education: Patient educated on therapy and the impact and effects of PTSD with therapy in which she is in agreement.  Patient reports she like to be to see a therapist within his office.  Patient educated on the medication Lunesta  of its uses as well as side effects and adverse reaction.  Patient educated on suicidal risk as well as having a firearm within the home to have a secured with ammunition separated as well as to call 911 or go to emergency department should she have suicidal thoughts with or without a plan. - Follow-Up: Follow-up in 2 week for medication monitoring and adjustment - Referrals: No referrals - Safety Planning:  The patient has been educated, if they should have suicidal thoughts with or without a plan to call 911, or go to the closest emergency department.  Pt  verbalized understanding.  Pt endorses having a firearm in her RV which she lives in for safety due to being single and living in an RV by herself with her dog.  Patient agrees to keep ammunition and firearm separated.  Pt also agrees to call the clinic should they have worsening symptoms before the next appointment.  Patient/Guardian was advised Release of Information must be obtained prior to any record release in order to collaborate their care with an outside provider. Patient/Guardian was advised if they have not already done so to contact the registration department to sign all necessary forms in order for us  to release information regarding their care.   Consent: Patient/Guardian gives verbal consent for treatment and assignment of benefits for services provided during this visit. Patient/Guardian expressed understanding and agreed to proceed.    [name], NP 05/27/2024, 8:33 AM

## 2024-05-31 DIAGNOSIS — F4312 Post-traumatic stress disorder, chronic: Secondary | ICD-10-CM | POA: Diagnosis not present

## 2024-06-03 ENCOUNTER — Encounter: Payer: Self-pay | Admitting: Family Medicine

## 2024-06-03 ENCOUNTER — Ambulatory Visit: Admitting: Family Medicine

## 2024-06-03 VITALS — BP 108/72 | HR 90 | Resp 16 | Ht 67.0 in | Wt 200.0 lb

## 2024-06-03 DIAGNOSIS — G4730 Sleep apnea, unspecified: Secondary | ICD-10-CM | POA: Insufficient documentation

## 2024-06-03 DIAGNOSIS — U071 COVID-19: Secondary | ICD-10-CM

## 2024-06-03 DIAGNOSIS — G4733 Obstructive sleep apnea (adult) (pediatric): Secondary | ICD-10-CM

## 2024-06-03 DIAGNOSIS — B349 Viral infection, unspecified: Secondary | ICD-10-CM

## 2024-06-03 LAB — POC COVID19/FLU A&B COMBO
Covid Antigen, POC: POSITIVE — AB
Influenza A Antigen, POC: NEGATIVE
Influenza B Antigen, POC: NEGATIVE

## 2024-06-03 NOTE — Addendum Note (Signed)
 Addended by: Demitri Kucinski A on: 06/03/2024 12:07 PM   Modules accepted: Orders

## 2024-06-03 NOTE — Progress Notes (Signed)
 Established Patient Office Visit  Subjective   Patient ID: Kim [name], female    DOB: [birth year]  Age: 58 y.o. MRN: [id number]  No chief complaint on file.   Delightful 58 year old woman with PTSD, AUD, pulmonary nodules, emphysema/COPD, chronic pain syndromes(DDD cervical and lumbar spines, numerous joint and tendonopathies) migraines, RLS, mixed hyperlipidemia, Lumbar radiculopathy bilateral, Left ulnar impaction syndrome, PTSD/bipolar D/O, insomnia, (Followed by Psychiatry), s/p hemicolectomy (secondary to diverticulitis), s/p colostomy and reversal x 2.   Just had CT chest that showed pulmonary nodules are stable.    She tells me that she never gets sick but yesterday all of a sudden she started having legs aching bottoms of her feet hurt to walk and she spiked a temperature to 101.1.  She does have a sore throat but she frequently has a cold sore throat because she smokes.  Denies wheezing, shortness of breath with ambulation.  She does have nasal congestion and PND.  She does not have any sick contacts.  Yesterday morning she woke up gasping for air and it scared her really badly.  This has happened before.  She got a sleep study around 2007 that was normal per her.  She does not know if she snores but she thinks she does.    ROS    Objective:     BP 108/72   Pulse 90   Resp 16   Ht 5' 7 (1.702 m)   Wt 200 lb (90.7 kg)   SpO2 98%   BMI 31.32 kg/m    Physical Exam Vitals and nursing note reviewed.  Constitutional:      Appearance: Normal appearance.  HENT:     Head: Normocephalic and atraumatic.     Right Ear: Tympanic membrane, ear canal and external ear normal. There is no impacted cerumen.     Left Ear: Tympanic membrane, ear canal and external ear normal. There is no impacted cerumen.     Nose: Congestion present.     Right Turbinates: Swollen.     Left Turbinates: Swollen.     Mouth/Throat:     Pharynx: Oropharynx is clear. Posterior oropharyngeal  erythema present.  Eyes:     Conjunctiva/sclera: Conjunctivae normal.  Cardiovascular:     Rate and Rhythm: Normal rate and regular rhythm.  Pulmonary:     Effort: Pulmonary effort is normal.     Breath sounds: Normal breath sounds.  Musculoskeletal:     Cervical back: Neck supple. No tenderness.  Lymphadenopathy:     Cervical: No cervical adenopathy.  Skin:    General: Skin is warm and dry.  Neurological:     Mental Status: She is alert and oriented to person, place, and time.  Psychiatric:        Mood and Affect: Mood normal.        Behavior: Behavior normal.        Thought Content: Thought content normal.        Judgment: Judgment normal.          No results found for any visits on 06/03/24.    The 10-year ASCVD risk score (Arnett DK, et al., 2019) is: 3.4%    Assessment & Plan:  Acute viral syndrome  Obstructive sleep apnea syndrome Assessment & Plan: Waked up gasping for air.  Will refer to pulmonology for sleep study  Orders: -     Pulmonary Visit  COVID-19 virus infection Assessment & Plan: Has Covid.  Does not want treatment.  Please take tylenol   for the achiness and push fluids.  If you develop SOB, dizziness or cannot keep fluids down, go to the ER.       No follow-ups on file.    Tryphena Perkovich K Nikoleta Dady, MD

## 2024-06-03 NOTE — Assessment & Plan Note (Addendum)
 Waked up gasping for air.  Will refer to pulmonology for sleep study

## 2024-06-03 NOTE — Assessment & Plan Note (Signed)
 Has Covid.  Does not want treatment.  Please take tylenol  for the achiness and push fluids.  If you develop SOB, dizziness or cannot keep fluids down, go to the ER.

## 2024-06-06 NOTE — Telephone Encounter (Signed)
 Noted

## 2024-06-07 DIAGNOSIS — F4312 Post-traumatic stress disorder, chronic: Secondary | ICD-10-CM | POA: Diagnosis not present

## 2024-06-10 ENCOUNTER — Ambulatory Visit: Admitting: Sleep Medicine

## 2024-06-21 DIAGNOSIS — F4312 Post-traumatic stress disorder, chronic: Secondary | ICD-10-CM | POA: Diagnosis not present

## 2024-06-26 NOTE — Progress Notes (Deleted)
 Cardiology Office Note    Date:  06/26/2024  ID:  Kim [name], DOB Aug 06, 1966, MRN [id number] PCP:  Ziglar, Susan K, MD  Cardiologist:  None  Electrophysiologist:  None   Chief Complaint: ***  History of Present Illness: Kim [name]    Kim [name] is a 58 y.o. female with visit-pertinent history of aortic atherosclerosis, dyslipidemia and tobacco use.  Patient seen by Dr. Edwyna on 01/07/2023 with reports of chest discomfort, patient opted for cardiac catheterization.  LHC on/29/24 indicated mild LV systolic dysfunction with EF 50 to 55%, coronary arteries were normal.  It was recommended that she follow-up with Dr. Edwyna in outpatient setting.  Patient was lost to follow-up.  Chest pain: COPD:  Tobacco use disorder:  Hyperlipidemia:   Labwork independently reviewed:   ROS: .   *** denies chest pain, shortness of breath, lower extremity edema, fatigue, palpitations, melena, hematuria, hemoptysis, diaphoresis, weakness, presyncope, syncope, orthopnea, and PND.  All other systems are reviewed and otherwise negative.  Studies Reviewed: Kim [name]    EKG:  EKG is ordered today, personally reviewed, demonstrating ***     CV Studies: Cardiac studies reviewed are outlined and summarized above. Otherwise please see EMR for full report. Cardiac Studies & Procedures   ______________________________________________________________________________________________ CARDIAC CATHETERIZATION  CARDIAC CATHETERIZATION 01/12/2023  Conclusion Images from the original result were not included.    There is mild left ventricular systolic dysfunction.   The left ventricular ejection fraction is 50-55% by visual estimate.  Kim [name] is a 58 y.o. female   [id number] LOCATION:  FACILITY: [facility] PHYSICIAN: Dorn Lesches, M.D. 1965-12-27   DATE OF PROCEDURE:  01/12/2023  DATE OF DISCHARGE:     CARDIAC CATHETERIZATION    History obtained from chart review.  Kim [name] is a  58 year old divorced Caucasian female patient of Dr. Edwyna referred for outpatient diagnostic coronary angiography because of progressive chest pain, tobacco abuse, hyperlipidemia and family history of heart disease.  Impression Kim [name] had normal coronary arteries and normal LV systolic function.  LVEDP was normal as well.  I believe her chest pain is noncardiac.  Interestingly, despite smoking 4 packs of cigarettes a day she has no fluoroscopic coronary calcification.  The sheath was removed and a TR band was placed on the right wrist to achieve patent hemostasis.  The patient left lab in stable condition.  She will be discharged early this afternoon and follow-up with Dr. Revankar.  Dorn Lesches. MD, [facility] 01/12/2023 9:42 AM  Findings Coronary Findings Diagnostic  Dominance: Right  No diagnostic findings have been documented. Intervention  No interventions have been documented.              ______________________________________________________________________________________________       Current Reported Medications:.    No outpatient medications have been marked as taking for the 06/28/24 encounter (Appointment) with Pearly Bartosik D, NP.    Physical Exam:    VS:  There were no vitals taken for this visit.   Wt Readings from Last 3 Encounters:  06/03/24 200 lb (90.7 kg)  05/19/24 201 lb 3.2 oz (91.3 kg)  04/18/24 204 lb 6.4 oz (92.7 kg)    GEN: Well nourished, well developed in no acute distress NECK: No JVD; No carotid bruits CARDIAC: ***RRR, no murmurs, rubs, gallops RESPIRATORY:  Clear to auscultation without rales, wheezing or rhonchi  ABDOMEN: Soft, non-tender, non-distended EXTREMITIES:  No edema; No acute deformity     Asessement and Plan:.     ***  Disposition: F/u with ***  Signed, Esteban Kobashigawa D Caelin Rosen, NP

## 2024-06-28 ENCOUNTER — Ambulatory Visit: Attending: Cardiology | Admitting: Cardiology

## 2024-06-28 DIAGNOSIS — F4312 Post-traumatic stress disorder, chronic: Secondary | ICD-10-CM | POA: Diagnosis not present

## 2024-06-30 ENCOUNTER — Ambulatory Visit: Admitting: Family Medicine

## 2024-06-30 ENCOUNTER — Other Ambulatory Visit: Payer: Self-pay

## 2024-06-30 ENCOUNTER — Encounter: Payer: Self-pay | Admitting: Family Medicine

## 2024-06-30 VITALS — BP 119/84 | HR 84 | Temp 97.5°F | Resp 18 | Ht 67.0 in | Wt 198.0 lb

## 2024-06-30 DIAGNOSIS — Z1152 Encounter for screening for COVID-19: Secondary | ICD-10-CM

## 2024-06-30 DIAGNOSIS — J439 Emphysema, unspecified: Secondary | ICD-10-CM

## 2024-06-30 LAB — POC COVID19 BINAXNOW: SARS Coronavirus 2 Ag: NEGATIVE

## 2024-06-30 NOTE — Patient Instructions (Signed)

## 2024-06-30 NOTE — Progress Notes (Signed)
 Established Patient Office Visit  Subjective  Patient ID: Kim [name], female    DOB: 1966/07/14  Age: 58 y.o. MRN: [id number]  Chief Complaint  Patient presents with   Medical Management of Chronic Issues   Discussed the use of AI scribe software for clinical note transcription with the patient, who gave verbal consent to proceed.  History of Present Illness   Kim [name] is a 58 year old female who presents for follow-up after a positive COVID-19 test a month ago.  She tested positive for COVID-19 about a month ago. Since then, she reports that she feels she no longer has COVID-19 and that her current symptoms are her usual baseline due to her chronic lung issues. Her current symptoms are consistent with her usual state due to COPD, which include a persistent cough and worsening breathing difficulties.  Her breathing difficulties have worsened, with a sensation of her lungs filling with mucus, which she attributes to her smoking habit. She has reduced her smoking from four packs to a pack and a half per day. She experiences significant discomfort, particularly when lying down, noting that her lungs 'gurgle' as if she is 'underwater.'  She has a history of chronic lung problems and sees a pulmonary specialist, but she postponed her appointments due to the COVID-19 test. She also mentions having a cardiologist and needing dental surgery, both of which have been delayed.   ROS: see HPI     Objective:    BP 119/84 (BP Location: Left Arm, Patient Position: Sitting, Cuff Size: Normal)   Pulse 84   Temp (!) 97.5 F (36.4 C) (Oral)   Resp 18   Ht 5' 7 (1.702 m)   Wt 198 lb (89.8 kg)   SpO2 95%   BMI 31.01 kg/m  BP Readings from Last 3 Encounters:  06/30/24 119/84  06/03/24 108/72  05/19/24 120/72    Physical Exam Vitals reviewed.  Constitutional:      Appearance: Normal appearance.  Cardiovascular:     Rate and Rhythm: Normal rate and regular  rhythm.     Pulses: Normal pulses.     Heart sounds: Normal heart sounds.  Pulmonary:     Effort: Pulmonary effort is normal.     Breath sounds: Normal breath sounds. No wheezing.  Neurological:     Mental Status: She is alert.  Psychiatric:        Mood and Affect: Mood normal.        Behavior: Behavior normal.     Assessment & Plan:   1. Chronic obstructive pulmonary disease with emphysema, unspecified emphysema type (HCC) (Primary) Patient presents today for concerns with having COVID19 infection still. She was tested about a month ago due to fevers and came back with positive testing. She reports she is asymptomatic. Vital signs stable today. She reports chronic cough and dyspnea due to COPD. She does report increased mucus and chronic shortness of breath. Lungs clear to auscultation bilaterally. No adventitious lungs sounds heard. Continues smoking, reduced from four packs to one to one and a half packs daily. Chronic cough likely worsened by smoking- discussed cessation and patient is not interested at this time.     2. Encounter for screening for COVID-19 Screening after positive test one month ago. No current COVID-19 symptoms, respiratory symptoms due to COPD. Patient would like to complete COVID-19 test so that she can see her specialists. POCT COVID negative in office today.  - POC COVID-19 BinaxNow   Return  if symptoms worsen or fail to improve.    Evalene Arts, FNP

## 2024-07-05 DIAGNOSIS — F4312 Post-traumatic stress disorder, chronic: Secondary | ICD-10-CM | POA: Diagnosis not present

## 2024-07-11 ENCOUNTER — Ambulatory Visit: Admitting: Nurse Practitioner

## 2024-07-12 DIAGNOSIS — F4312 Post-traumatic stress disorder, chronic: Secondary | ICD-10-CM | POA: Diagnosis not present

## 2024-07-14 ENCOUNTER — Ambulatory Visit: Admitting: Cardiology

## 2024-07-19 DIAGNOSIS — F4312 Post-traumatic stress disorder, chronic: Secondary | ICD-10-CM | POA: Diagnosis not present

## 2024-07-27 ENCOUNTER — Ambulatory Visit: Admitting: Cardiology

## 2024-08-02 DIAGNOSIS — F4312 Post-traumatic stress disorder, chronic: Secondary | ICD-10-CM | POA: Diagnosis not present

## 2024-08-09 DIAGNOSIS — F4312 Post-traumatic stress disorder, chronic: Secondary | ICD-10-CM | POA: Diagnosis not present

## 2024-08-16 ENCOUNTER — Ambulatory Visit: Admitting: Pulmonary Disease

## 2024-08-16 DIAGNOSIS — F4312 Post-traumatic stress disorder, chronic: Secondary | ICD-10-CM | POA: Diagnosis not present

## 2024-08-23 ENCOUNTER — Other Ambulatory Visit: Payer: Self-pay | Admitting: Family Medicine

## 2024-08-23 DIAGNOSIS — F4312 Post-traumatic stress disorder, chronic: Secondary | ICD-10-CM | POA: Diagnosis not present

## 2024-08-23 DIAGNOSIS — G2581 Restless legs syndrome: Secondary | ICD-10-CM

## 2024-08-30 DIAGNOSIS — F4312 Post-traumatic stress disorder, chronic: Secondary | ICD-10-CM | POA: Diagnosis not present

## 2024-09-23 ENCOUNTER — Ambulatory Visit: Admitting: Family Medicine

## 2024-09-26 ENCOUNTER — Encounter: Payer: Self-pay | Admitting: Sleep Medicine

## 2024-09-26 ENCOUNTER — Encounter: Payer: Self-pay | Admitting: Pulmonary Disease

## 2024-09-27 ENCOUNTER — Ambulatory Visit: Admitting: Sleep Medicine

## 2024-09-30 ENCOUNTER — Ambulatory Visit: Admitting: Pulmonary Disease
